# Patient Record
Sex: Male | Born: 1955 | Race: White | Hispanic: No | Marital: Married | Smoking: Never smoker
Health system: Southern US, Community
[De-identification: ages and names within clinical notes are randomized; demographics above are authoritative.]

## PROBLEM LIST (undated history)

## (undated) DIAGNOSIS — Z720 Tobacco use: Secondary | ICD-10-CM

## (undated) DIAGNOSIS — M069 Rheumatoid arthritis, unspecified: Secondary | ICD-10-CM

## (undated) DIAGNOSIS — N529 Male erectile dysfunction, unspecified: Secondary | ICD-10-CM

## (undated) DIAGNOSIS — M7031 Other bursitis of elbow, right elbow: Secondary | ICD-10-CM

## (undated) DIAGNOSIS — I4892 Unspecified atrial flutter: Secondary | ICD-10-CM

## (undated) HISTORY — DX: Tobacco use: Z72.0

## (undated) HISTORY — DX: Other bursitis of elbow, right elbow: M70.31

## (undated) HISTORY — DX: Rheumatoid arthritis, unspecified: M06.9

## (undated) HISTORY — DX: Male erectile dysfunction, unspecified: N52.9

## (undated) HISTORY — DX: Unspecified atrial flutter: I48.92

## (undated) HISTORY — PX: COLONOSCOPY: SHX174

---

## 1997-04-03 HISTORY — PX: BACK SURGERY: SHX140

## 1998-01-13 ENCOUNTER — Inpatient Hospital Stay (HOSPITAL_COMMUNITY): Admission: RE | Admit: 1998-01-13 | Discharge: 1998-01-14 | Payer: Self-pay | Admitting: Neurosurgery

## 1998-01-13 ENCOUNTER — Encounter: Payer: Self-pay | Admitting: Neurosurgery

## 1999-03-12 ENCOUNTER — Emergency Department (HOSPITAL_COMMUNITY): Admission: EM | Admit: 1999-03-12 | Discharge: 1999-03-12 | Payer: Self-pay

## 1999-03-12 ENCOUNTER — Encounter: Payer: Self-pay | Admitting: Endocrinology

## 2002-01-04 ENCOUNTER — Emergency Department (HOSPITAL_COMMUNITY): Admission: EM | Admit: 2002-01-04 | Discharge: 2002-01-04 | Payer: Self-pay | Admitting: *Deleted

## 2002-01-04 ENCOUNTER — Encounter: Payer: Self-pay | Admitting: *Deleted

## 2004-04-05 ENCOUNTER — Ambulatory Visit (HOSPITAL_BASED_OUTPATIENT_CLINIC_OR_DEPARTMENT_OTHER): Admission: RE | Admit: 2004-04-05 | Discharge: 2004-04-05 | Payer: Self-pay | Admitting: Surgery

## 2004-04-05 ENCOUNTER — Ambulatory Visit (HOSPITAL_COMMUNITY): Admission: RE | Admit: 2004-04-05 | Discharge: 2004-04-05 | Payer: Self-pay | Admitting: Surgery

## 2004-04-05 ENCOUNTER — Encounter (INDEPENDENT_AMBULATORY_CARE_PROVIDER_SITE_OTHER): Payer: Self-pay | Admitting: Specialist

## 2004-10-27 ENCOUNTER — Emergency Department (HOSPITAL_COMMUNITY): Admission: EM | Admit: 2004-10-27 | Discharge: 2004-10-27 | Payer: Self-pay | Admitting: Family Medicine

## 2004-11-05 ENCOUNTER — Emergency Department (HOSPITAL_COMMUNITY): Admission: EM | Admit: 2004-11-05 | Discharge: 2004-11-05 | Payer: Self-pay | Admitting: Emergency Medicine

## 2005-03-18 ENCOUNTER — Emergency Department (HOSPITAL_COMMUNITY): Admission: EM | Admit: 2005-03-18 | Discharge: 2005-03-18 | Payer: Self-pay | Admitting: Family Medicine

## 2007-10-20 ENCOUNTER — Emergency Department (HOSPITAL_COMMUNITY): Admission: EM | Admit: 2007-10-20 | Discharge: 2007-10-20 | Payer: Self-pay | Admitting: Family Medicine

## 2008-08-04 ENCOUNTER — Ambulatory Visit (HOSPITAL_COMMUNITY): Admission: RE | Admit: 2008-08-04 | Discharge: 2008-08-04 | Payer: Self-pay | Admitting: Family Medicine

## 2008-08-10 ENCOUNTER — Ambulatory Visit (HOSPITAL_COMMUNITY): Admission: RE | Admit: 2008-08-10 | Discharge: 2008-08-10 | Payer: Self-pay | Admitting: Family Medicine

## 2008-08-14 ENCOUNTER — Encounter: Admission: RE | Admit: 2008-08-14 | Discharge: 2008-08-14 | Payer: Self-pay | Admitting: Surgery

## 2008-08-18 ENCOUNTER — Ambulatory Visit: Payer: Self-pay | Admitting: Internal Medicine

## 2008-08-18 DIAGNOSIS — R05 Cough: Secondary | ICD-10-CM

## 2008-08-18 DIAGNOSIS — R059 Cough, unspecified: Secondary | ICD-10-CM | POA: Insufficient documentation

## 2008-08-18 DIAGNOSIS — J984 Other disorders of lung: Secondary | ICD-10-CM | POA: Insufficient documentation

## 2008-08-19 ENCOUNTER — Ambulatory Visit: Payer: Self-pay | Admitting: Cardiology

## 2008-08-19 ENCOUNTER — Telehealth: Payer: Self-pay | Admitting: Internal Medicine

## 2008-08-27 ENCOUNTER — Encounter: Payer: Self-pay | Admitting: Internal Medicine

## 2010-05-03 NOTE — Progress Notes (Signed)
Summary: results  Phone Note Call from Patient Call back at  [phone]   Caller: Patient Call For: Quatisha Zylka Reason for Call: Talk to Nurse, Lab or Test Results Summary of Call: call with results of ct Initial call taken by: Eugene Gavia,  Aug 19, 2008 3:29 PM  Follow-up for Phone Call        done. next ct in 6 months Follow-up by: Kalman Shan MD,  Aug 19, 2008 5:39 PM

## 2010-05-03 NOTE — Assessment & Plan Note (Signed)
Summary: r non-calcified pleural/r lung base plaque/apc   Visit Type:  Initial Consult Copy to:  Dr. Azucena Kuba Primary Provider/Referring Provider:  Dr. Azucena Kuba  CC:  Pt here for pulmonary consult. pt c/o 35lb weight loss sne Feb. 2010 and abnormal CT of the chest. .  History of Present Illness: IOV 08/18/2008: 55 year old male. Has alternate diarrhea and constipation since FEb 2010. On and off since onset. Insidious onset. Stools are loose and watery. Associated with intermittently constipation. Associated abdominal pain present . Pain is RUQ to Rt infrascapular region. Pain incresaed by eating. Pain relieved by not eating. Very intense in severity. No specific food association for pain. Associated nausea and vomit present.   Associaed  wt loss 232# -> 2073 in 2 weeks +. Symptoms led to [place] gall bladder -> HIDA scan -> CT abdomen -> lung cuts reportedly showed small nodule in lung in Rt base. He is now concerned that wt loss is related to pulmonary nodule. However, there is no full ct chest  Symptoms also associated with mild-moderate cough for 2-3 weeks, dry cough, worst in day time. No associated paroxysmal nocturnal dyspnea, gerd, uri, hemoptysis.   Current Medications (verified): 1)  None  Allergies (verified): 1)  ! 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  Review of Systems       The patient complains of weight change, abdominal pain, and non-productive cough.  The patient denies shortness of breath at rest, coughing up blood, chest pain, irregular heartbeats, indigestion, loss of appetite, difficulty swallowing, sore throat, tooth/dental problems, headaches, nasal congestion/difficulty breathing through nose, sneezing, itching, ear ache, anxiety, depression, hand/feet swelling, joint stiffness or pain, rash, change in color of mucus, fever, shortness of breath with activity, productive cough, and acid heartburn.    Vital Signs:  Patient profile:   55 year old male Height:      77 inches Weight:      213.13 pounds BMI:     25.36 O2 Sat:      98 % Temp:     98.0 degrees F oral Pulse rate:   87 / minute BP sitting:   122 / 78  (right arm) Cuff size:   regular  Vitals Entered By: Carron Curie CMA (Aug 18, 2008  3:45 PM)  O2 Sat at Rest %:  98 O2 Flow:  room air CC: Pt here for pulmonary consult. pt c/o 35lb weight loss sne Feb. 2010 and abnormal CT of the chest.  Comments Medications reviewed with patient Carron Curie CMA  Aug 18, 2008 3:47 PM    Physical Exam  General:  well developed, well nourished, in no acute distress Head:  normocephalic and atraumatic Eyes:  PERRLA/EOM intact; conjunctiva and sclera clear Ears:  TMs intact and clear with normal canals Nose:  no deformity, discharge, inflammation, or lesions Mouth:  no deformity or lesions Neck:  no masses, thyromegaly, or abnormal cervical nodes Chest Wall:  no deformities noted Lungs:  clear bilaterally to auscultation and percussion Heart:  regular rate and rhythm, S1, S2 without murmurs, rubs, gallops, or clicks Abdomen:  bowel sounds  positive; abdomen soft and non-tender without masses, or organomegaly Msk:  no deformity or scoliosis noted with normal posture Pulses:  pulses normal Extremities:  no clubbing, cyanosis, edema, or deformity noted Neurologic:  CN II-XII grossly intact with normal reflexes, coordination, muscle strength and tone Skin:  intact without lesions or rashes Cervical Nodes:  no significant adenopathy Axillary Nodes:  no significant adenopathy Psych:  alert and cooperative; normal mood and affect; normal attention span and concentration   CT of Abdomen  Procedure date:  08/14/2008  Findings:       IMPRESSION:   1.  No significant abnormality on CT the abdomen.   2.  Probable fatty infiltration of the liver.   3.  Probable small noncalcified plaque at the right lung base   abutting the hemidiaphragm. Read By:  Juline Patch,  M.D.   Released By:  Juline Patch,  M.D.  personally reviewed and agree with Dr. Gery Pray  Impression & Recommendations:  Problem # 1:  PULMONARY NODULE (ICD-518.89) Assessment New CT 08/14/2008 is one of abdomen. I am not sure what to make of the partial nodule on the ct abdomen lung cut. We have no idea whta the full extension and if it is related to current symptoms  plan get full ct chest review based on ct chest Orders: Radiology Referral (Radiology) Consultation Level IV (81191)  Problem # 2:  COUGH (ICD-786.2) Assessment: New  address at next visit  Orders: Consultation Level IV (47829)  Patient Instructions: 1)  please have CT scan chest with contrast 08/19/2008 2)  call me after ct to review it 3)  further followup based on above

## 2010-05-03 NOTE — Miscellaneous (Signed)
Summary: CT in 6 mths  Clinical Lists Changes  Orders: Added new Referral order of Radiology Referral (Radiology) - Signed

## 2010-08-19 NOTE — Op Note (Signed)
NAME:  MAINOR, HELLMANN NO.:  [id number]   MEDICAL RECORD NO.:  [id number]          PATIENT TYPE:  AMB   LOCATION:  DSC                          FACILITY:  [facility]   PHYSICIAN:  Velora Heckler, MD      DATE OF BIRTH:  [birth year]   DATE OF PROCEDURE:  04/05/2004  DATE OF DISCHARGE:                                 OPERATIVE REPORT   PREOPERATIVE DIAGNOSIS:  Soft tissue mass right occiput.   POSTOPERATIVE DIAGNOSIS:  Soft tissue mass right occiput.   PROCEDURE:  Excise 1 cm subcutaneous mass right occiput.   SURGEON:  Velora Heckler, M.D.   ANESTHESIA:  1% lidocaine local with epinephrine.   ESTIMATED BLOOD LOSS:  Minimal.   PREPARATION:  Betadine.   COMPLICATIONS:  None.   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  This has now recurred and increased in size.  He desires  reexcision.   BODY OF REPORT:  The procedure was done in the minor procedure room at the  [facility].  The patient was brought to the operating  room and placed in a prone position on the operating room table.  Skin was  prepped with Betadine.  It was draped in the usual aseptic fashion.  Skin  was anesthetized with local anesthetic with epinephrine.  Using a #15 blade,  an elliptical incision was made so as to encompass the previous scar and the  underlying soft tissue mass.  Dissection was carried full thickness into the  subcutaneous tissues.  A cystic type lesion with a solid core was excised.  The entire wall was excised.  Specimen was submitted to pathology for  review.  It measured slightly less than 1 cm in dimension.  Skin was  reapproximated with interrupted 4-0 nylon sutures.  Good hemostasis was  noted.  Neosporin ointment and a dry gauze dressing were placed.  The  patient tolerated the procedure well.      Todd   TMG/MEDQ  D:  04/05/2004   T:  04/05/2004  Job:  [id number]   cc:   Velora Heckler, MD  [address] [place]  [place] [address]  Fax: [phone]

## 2013-12-09 DIAGNOSIS — I4892 Unspecified atrial flutter: Secondary | ICD-10-CM | POA: Insufficient documentation

## 2013-12-16 ENCOUNTER — Ambulatory Visit (INDEPENDENT_AMBULATORY_CARE_PROVIDER_SITE_OTHER): Payer: BC Managed Care – PPO | Admitting: Cardiovascular Disease

## 2013-12-16 ENCOUNTER — Encounter: Payer: Self-pay | Admitting: Cardiovascular Disease

## 2013-12-16 VITALS — BP 122/86 | HR 64 | Ht 77.0 in | Wt 220.4 lb

## 2013-12-16 DIAGNOSIS — I483 Typical atrial flutter: Secondary | ICD-10-CM

## 2013-12-16 DIAGNOSIS — F17200 Nicotine dependence, unspecified, uncomplicated: Secondary | ICD-10-CM

## 2013-12-16 DIAGNOSIS — J984 Other disorders of lung: Secondary | ICD-10-CM

## 2013-12-16 DIAGNOSIS — I4892 Unspecified atrial flutter: Secondary | ICD-10-CM

## 2013-12-16 MED ORDER — DILTIAZEM HCL ER COATED BEADS 120 MG PO CP24: 120.0000 mg | ORAL_CAPSULE | Freq: Every day | ORAL | Status: DC

## 2013-12-16 NOTE — Progress Notes (Addendum)
     History of Present Illness: 58 yo male with history new onset atrial flutter, tobacco abuse (smokeless tobacco) of here today as a new patient for evaluation of atrial flutter. He was admitted to [facility] 12/03/13-12/04/13 with weakness and SOB and found to have tachycardia. His HR was initially 160 bpm. He was given a dose of adenosine which revealed atrial flutter. He was treated with metoprolol and IV Cardizem. His CHADS score was 0 so he was discharged home with ASA and no anti-coagulation. He was discharged home on Cardizem 30 mg po Q6 hours. Echo at [facility] but not available to review.   He feels much better. No chest pain or SOB. No palpitations.   Addendum 12/30/13: Echo reviewed from [place]. 12/04/13. LVEF normal. RV function normal. Trivial MR, TR.   Primary Care Physician: Donald Prose   Past Medical History  Diagnosis Date  . Atrial flutter   . Erectile dysfunction   . Tobacco abuse     Smokeless tobacco    Past Surgical History  Procedure Laterality Date  . Back surgery  1999    Current Outpatient Prescriptions  Medication Sig Dispense Refill  . aspirin EC 81 MG tablet Take by mouth daily.       Marland Kitchen diltiazem (CARDIZEM) 30 MG tablet Take by mouth 4 (four) times daily.        No current facility-administered medications for this visit.    Allergies  Allergen Reactions  . Codeine Other (See Comments)    agitation  . Penicillin G Other (See Comments)  . Penicillins Itching, Swelling and Rash    History   Social History  . Marital Status: Married    Spouse Name: N/A    Number of Children: 1  . Years of Education: N/A   Occupational History  . Electrician    Social History Main Topics  . Smoking status: Never Smoker   . Smokeless tobacco: Current User    Types: Snuff  . Alcohol Use: Yes     Comment: Rare  . Drug Use: No  . Sexual Activity: Not on file   Other Topics Concern  . Not on file   Social History Narrative  . No narrative on file     Family History  Problem Relation Age of Onset  . Heart attack Father   . Heart attack Mother     Review of Systems:  As stated in the HPI and otherwise negative.   BP 122/86  Pulse 64  Ht 6\' 5"  (1.956 m)  Wt 220 lb 6.4 oz (99.973 kg)  BMI 26.13 kg/m2  Physical Examination: General: Well developed, well nourished, NAD HEENT: OP clear, mucus membranes moist SKIN: warm, dry. No rashes. Neuro: No focal deficits Musculoskeletal: Muscle strength 5/5 all ext Psychiatric: Mood and affect normal Neck: No JVD, no carotid bruits, no thyromegaly, no lymphadenopathy. Lungs:Clear bilaterally, no wheezes, rhonci, crackles Cardiovascular: Regular rate and rhythm. No murmurs, gallops or rubs. Abdomen:Soft. Bowel sounds present. Non-tender.  Extremities: No lower extremity edema. Pulses are 2 + in the bilateral DP/PT.  EKG: NSR, rate 64 bpm.   Assessment and Plan:   1. Atrial flutter: He is back in sinus today. He feels much better. CHADSVASC score of 0. Anti-coagulation is not indicated. Will continue ASA 81 mg daily. Will change Cardizem 30 mg Q6 hours to Cardizem CD 120 mg daily. I will get records from [facility] to review echo. I will see him back in 6 months.

## 2013-12-16 NOTE — Patient Instructions (Signed)
Your physician has recommended you make the following change in your medication:   START TAKING CARDIZEM CD [place]  Your physician wants you to follow-up in: [place] will receive a reminder letter in the mail two months in advance. If you don't receive a letter, please call our office to schedule the follow-up appointment.

## 2014-01-26 ENCOUNTER — Ambulatory Visit (INDEPENDENT_AMBULATORY_CARE_PROVIDER_SITE_OTHER): Payer: BC Managed Care – PPO | Admitting: Podiatry

## 2014-01-26 ENCOUNTER — Encounter: Payer: Self-pay | Admitting: Podiatry

## 2014-01-26 VITALS — Ht 77.0 in | Wt 219.0 lb

## 2014-01-26 DIAGNOSIS — Q667 Congenital pes cavus, unspecified foot: Secondary | ICD-10-CM

## 2014-01-26 DIAGNOSIS — Q828 Other specified congenital malformations of skin: Secondary | ICD-10-CM

## 2014-01-26 NOTE — Progress Notes (Signed)
   Subjective:    Patient ID: Peter Mcmillan, male    DOB: 04-17-1955, 58 y.o.   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   Review of Systems  All other systems reviewed and are negative.      Objective:   Physical Exam Orientated 3  Vascular: DP and PT pulses 2/4 bilaterally Capillary reflex medially bilaterally  Neurological: Ankle reflex equal and reactive bilaterally  Dermatological: Nuclear plantar keratoses fifth right MPJ surrounded with some fibrous leathery scan and keratoses Slight non-nucleated plantar keratoses fifth left MPJ  Musculoskeletal: Pes cavus bilaterally Mild rear foot varus bilaterally         Assessment & Plan:   Assessment: Pes cavus bilaterally Porokeratosis fifth MPJ right  Plan: The plantar keratoses and right was debrided back with salinocaine I attached a felt pad to offload the fifth right MPJ and dispensed to transfer patient to attach to his shoes  Patient advised that lesion most likely would be chronic and return as needed. An accommodative foot orthotic could be used if the padding was successful.

## 2014-01-27 ENCOUNTER — Encounter: Payer: Self-pay | Admitting: Podiatry

## 2015-10-31 ENCOUNTER — Emergency Department (HOSPITAL_COMMUNITY)
Admission: EM | Admit: 2015-10-31 | Discharge: 2015-10-31 | Disposition: A | Discharge status: Home / Self Care | Payer: BLUE CROSS/BLUE SHIELD | Attending: Emergency Medicine | Admitting: Emergency Medicine

## 2015-10-31 ENCOUNTER — Emergency Department (HOSPITAL_COMMUNITY): Payer: BLUE CROSS/BLUE SHIELD

## 2015-10-31 DIAGNOSIS — I484 Atypical atrial flutter: Secondary | ICD-10-CM | POA: Diagnosis not present

## 2015-10-31 DIAGNOSIS — R000 Tachycardia, unspecified: Secondary | ICD-10-CM | POA: Diagnosis present

## 2015-10-31 DIAGNOSIS — I4892 Unspecified atrial flutter: Secondary | ICD-10-CM

## 2015-10-31 LAB — MAGNESIUM: Magnesium: 1.9 mg/dL (ref 1.7–2.4)

## 2015-10-31 LAB — CBC WITH DIFFERENTIAL/PLATELET
Basophils Absolute: 0 10*3/uL (ref 0.0–0.1)
Basophils Relative: 1 %
Eosinophils Absolute: 0.2 10*3/uL (ref 0.0–0.7)
Eosinophils Relative: 2 %
HCT: 49.5 % (ref 39.0–52.0)
Hemoglobin: 17.3 g/dL — ABNORMAL HIGH (ref 13.0–17.0)
Lymphocytes Relative: 22 %
Lymphs Abs: 2 10*3/uL (ref 0.7–4.0)
MCH: 31.2 pg (ref 26.0–34.0)
MCHC: 34.9 g/dL (ref 30.0–36.0)
MCV: 89.2 fL (ref 78.0–100.0)
Monocytes Absolute: 0.9 10*3/uL (ref 0.1–1.0)
Monocytes Relative: 10 %
Neutro Abs: 5.7 10*3/uL (ref 1.7–7.7)
Neutrophils Relative %: 65 %
Platelets: 281 10*3/uL (ref 150–400)
RBC: 5.55 MIL/uL (ref 4.22–5.81)
RDW: 12.4 % (ref 11.5–15.5)
WBC: 8.7 10*3/uL (ref 4.0–10.5)

## 2015-10-31 LAB — BASIC METABOLIC PANEL
Anion gap: 8 (ref 5–15)
BUN: 12 mg/dL (ref 6–20)
CO2: 22 mmol/L (ref 22–32)
Calcium: 9.4 mg/dL (ref 8.9–10.3)
Chloride: 108 mmol/L (ref 101–111)
Creatinine, Ser: 0.93 mg/dL (ref 0.61–1.24)
GFR calc Af Amer: >60 mL/min (ref >60)
GFR calc non Af Amer: >60 mL/min (ref >60)
Glucose, Bld: 145 mg/dL — ABNORMAL HIGH (ref 65–99)
Potassium: 3.6 mmol/L (ref 3.5–5.1)
Sodium: 138 mmol/L (ref 135–145)

## 2015-10-31 LAB — I-STAT TROPONIN, ED: Troponin i, poc: 0.01 ng/mL (ref 0.00–0.08)

## 2015-10-31 LAB — PROTIME-INR
INR: 1
Prothrombin Time: 13.2 seconds (ref 11.4–15.2)

## 2015-10-31 MED ORDER — PROPOFOL 10 MG/ML IV BOLUS: 0.5000 mg/kg | Freq: Once | INTRAVENOUS | Status: DC

## 2015-10-31 MED ORDER — ASPIRIN 81 MG PO CHEW
324.0000 mg | CHEWABLE_TABLET | Freq: Once | ORAL | Status: AC
  Administered 2015-10-31: 324 mg via ORAL
  Filled 2015-10-31: qty 4

## 2015-10-31 MED ORDER — APIXABAN 5 MG PO TABS
5.0000 mg | ORAL_TABLET | Freq: Once | ORAL | Status: AC
  Administered 2015-10-31: 5 mg via ORAL
  Filled 2015-10-31: qty 1

## 2015-10-31 MED ORDER — SODIUM CHLORIDE 0.9 % IV BOLUS (SEPSIS)
1000.0000 mL | Freq: Once | INTRAVENOUS | Status: AC
  Administered 2015-10-31: 1000 mL via INTRAVENOUS

## 2015-10-31 MED ORDER — PROPOFOL 10 MG/ML IV BOLUS
INTRAVENOUS | Status: AC
  Filled 2015-10-31: qty 20

## 2015-10-31 MED ORDER — PROPOFOL 10 MG/ML IV BOLUS
INTRAVENOUS | Status: AC | PRN
  Administered 2015-10-31: 40 mg via INTRAVENOUS

## 2015-10-31 MED ORDER — DILTIAZEM HCL 25 MG/5ML IV SOLN
20.0000 mg | Freq: Once | INTRAVENOUS | Status: AC
  Administered 2015-10-31: 20 mg via INTRAVENOUS
  Filled 2015-10-31: qty 5

## 2015-10-31 MED ORDER — DILTIAZEM HCL 100 MG IV SOLR
5.0000 mg/h | Freq: Once | INTRAVENOUS | Status: AC
  Administered 2015-10-31: 5 mg/h via INTRAVENOUS
  Filled 2015-10-31: qty 100

## 2015-10-31 MED ORDER — FLECAINIDE ACETATE 100 MG PO TABS
300.0000 mg | ORAL_TABLET | Freq: Once | ORAL | Status: AC
Start: 2015-10-31 — End: 2015-10-31
  Administered 2015-10-31: 300 mg via ORAL
  Filled 2015-10-31: qty 3

## 2015-10-31 NOTE — ED Triage Notes (Addendum)
Pt c/o woke up at 0200 with weakness to arm. Pt has history of afib. Pt took his heart rate at home and it was in the 160's. Pt went to [facility] and heart rate again in 160's.

## 2015-10-31 NOTE — ED Notes (Signed)
PT is in stable condition upon d/c and ambulates from ED. 

## 2015-10-31 NOTE — ED Provider Notes (Signed)
[facility] DEPT Provider Note   CSN: [id number] Arrival date & time: 10/31/15  1020  First Provider Contact:  None       History   Chief Complaint Chief Complaint  Patient presents with  . Tachycardia  . Weakness    HPI Peter Mcmillan is a 60 y.o. male.  HPI  Patient with history of a-flutter at [place] 3 years ago that was difficult to treat per chart review, currently on extended release Diltiazem 120mg  Po, presents for palpitations.  This awoken from sleep this morning.  Associated with chest pressure and left arm pain.  He states this feels like the last time he had a-flutter.  Tried taking his PO diltiazem this morning without improvement.  Otherwise, mentions a recent chronic cough x3 weeks.  No fevers, SOB, abdominal pain.  Past Medical History:  Diagnosis Date  . Atrial flutter   . Erectile dysfunction   . Tobacco abuse    Smokeless tobacco    Patient Active Problem List   Diagnosis Date Noted  . PULMONARY NODULE 08/18/2008  . COUGH 08/18/2008    Past Surgical History:  Procedure Laterality Date  . BACK SURGERY  1999       Home Medications    Prior to Admission medications   Medication Sig Start Date End Date Taking? Authorizing Provider  diltiazem (CARDIZEM CD) 120 MG 24 hr capsule Take 1 capsule (120 mg total) by mouth daily. 12/16/13  Yes Burnell Blanks, MD    Family History Family History  Problem Relation Age of Onset  . Heart attack Father   . Heart attack Mother     Social History Social History  Substance Use Topics  . Smoking status: Never Smoker  . Smokeless tobacco: Current User    Types: Snuff  . Alcohol use Yes     Comment: Rare     Allergies   Codeine; Penicillin g; and Penicillins   Review of Systems Review of Systems  Constitutional: Negative for chills and fever.  HENT: Negative for ear pain and sore throat.   Eyes: Negative for pain and visual disturbance.  Respiratory: Negative for cough and  shortness of breath.   Cardiovascular: Positive for chest pain and palpitations.  Gastrointestinal: Negative for abdominal pain and vomiting.  Genitourinary: Negative for dysuria and hematuria.  Musculoskeletal: Negative for arthralgias and back pain.  Skin: Negative for color change and rash.  Neurological: Negative for seizures and syncope.  All other systems reviewed and are negative.    Physical Exam Updated Vital Signs BP 111/82   Pulse (!) 161   Resp 19   SpO2 97%   Physical Exam  Constitutional: He appears well-developed and well-nourished.  HENT:  Head: Normocephalic and atraumatic.  Eyes: Conjunctivae are normal.  Neck: Neck supple.  Cardiovascular: Regular rhythm.  Tachycardia present.   No murmur heard. Pulmonary/Chest: Effort normal and breath sounds normal. No respiratory distress.  Abdominal: Soft. There is no tenderness.  Musculoskeletal: He exhibits no edema.  Neurological: He is alert.  Skin: Skin is warm and dry.  Psychiatric: He has a normal mood and affect.  Nursing note and vitals reviewed.    ED Treatments / Results  Labs (all labs ordered are listed, but only abnormal results are displayed) Labs Reviewed  CBC WITH DIFFERENTIAL/PLATELET - Abnormal; Notable for the following:       Result Value   Hemoglobin 17.3 (*)    All other components within normal limits  BASIC METABOLIC PANEL - Abnormal; Notable  for the following:    Glucose, Bld 145 (*)    All other components within normal limits  MAGNESIUM  PROTIME-INR  I-STAT TROPOININ, ED    EKG  EKG Interpretation None       Radiology Dg Chest Portable 1 View  Result Date: 10/31/2015 CLINICAL DATA:  Cough EXAM: PORTABLE CHEST 1 VIEW COMPARISON:  Chest CT 08/19/2008 FINDINGS: Heart and mediastinal contours are within normal limits. No focal opacities or effusions. No acute bony abnormality. IMPRESSION: No active disease. Electronically Signed   By: Rolm Baptise M.D.   On: 10/31/2015  11:28   Procedures Procedures (including critical care time)  Medications Ordered in ED Medications  diltiazem (CARDIZEM) injection 20 mg (20 mg Intravenous Given 10/31/15 1050)  sodium chloride 0.9 % bolus 1,000 mL (0 mLs Intravenous Stopped 10/31/15 1128)  diltiazem (CARDIZEM) 100 mg in dextrose 5 % 100 mL (1 mg/mL) infusion (12.5 mg/hr Intravenous Rate/Dose Change 10/31/15 1254)  aspirin chewable tablet 324 mg (324 mg Oral Given 10/31/15 1155)  flecainide (TAMBOCOR) tablet 300 mg (300 mg Oral Given 10/31/15 1253)     Initial Impression / Assessment and Plan / ED Course  I have reviewed the triage vital signs and the nursing notes.  Pertinent labs & imaging results that were available during my care of the patient were reviewed by me and considered in my medical decision making (see chart for details).  Clinical Course    Initial evaluation patient was tachycardic in the 160s and a flutter with RVR. Patientwas normotensive. Diltiazem injection and infusion was ordered with no improvement in heart rate.  labs were overall unremarkable. Troponin was negative. Cardiology was consulted for assistance, gave flecanide, and plans to cardiovert.  Then he will ikely be discharged.  Patient signed out to Dr. Winfield Cunas @ 1530.  Final Clinical Impressions(s) / ED Diagnoses   Final diagnoses:  None    New Prescriptions New Prescriptions   No medications on file     Levada Schilling, MD 10/31/15 [place], MD 11/01/15 [place], MD 11/01/15 2110

## 2015-10-31 NOTE — ED Notes (Signed)
Called pharmacy to have them send Eliquis so pt may be d/c.

## 2015-10-31 NOTE — Sedation Documentation (Signed)
Pt shocked at [place] after heart rate is synced. Immediately following shock pt heart rate returns to NSR.

## 2015-10-31 NOTE — Progress Notes (Addendum)
Patient ID: Peter Mcmillan, male   DOB: [birth year], 60 y.o.   MRN: [id number]  EP Procedure Note  Procedure: DC cardioversion.  Preoperative diagnosis: atrial flutter with a RVR  Postoperative diagnosis: same as preoperative diagnosis  Description of the procedure: after informed consent was obtained, the patient was prepped in the usual manner. The electrodispersive pad was placed in the AP position. 60 mg of IV propofol was given and the patient was cardioverted with 200 joule of synchronized biphasic energy, restoring NSR. He was allowed to recover in the usual manner.   Complications: none immediately  Conclusion: succsessful DC CV in a patient with uncontrolled, symptomatic atrial flutter.  Skyelynn Rambeau,M.D.  Addendum: The patients very low risk for thromboembolic events. However, his risk of bleeding for a month on Eliquis is even less. For this reason, I have verbal ordered him to receive 5 mg of eliquis tonight prior to DC and instructed him to return to our office in the a.m. For a free monthl supply of eliquis.   Mikle Bosworth.D.

## 2015-10-31 NOTE — Consult Note (Signed)
   Reason for Consult:atrial flutter with a RVR  Referring Physician: . Dr. Leitha Schuller Varallo is an 60 y.o. male.   HPI: The patient is a 60 yo man with a h/o atrial flutter approx 2 years ago. He has preserved LV function and reverted back to NSR. He has done well. He went to bed last night feeling normally but awoke at 2 and felt like his heart was racing. He went back to sleep. When he got up this morning he noted more racing and presents for additional evaluation. The patient had a sandwich at 8 a.m. But nothing since then. He denies chest pain or sob. No syncope and no edema. His Chadsvasc is 0.   PMH: Past Medical History:  Diagnosis Date  . Atrial flutter   . Erectile dysfunction   . Tobacco abuse    Smokeless tobacco    PSHX: Past Surgical History:  Procedure Laterality Date  . BACK SURGERY  1999    FAMHX: Family History  Problem Relation Age of Onset  . Heart attack Father   . Heart attack Mother     Social History:  reports that he has never smoked. His smokeless tobacco use includes Snuff. He reports that he drinks alcohol. He reports that he does not use drugs.  Allergies:  Allergies  Allergen Reactions  . Codeine Other (See Comments)    agitation  . Penicillin G Other (See Comments)  . Penicillins Itching, Swelling and Rash    Medications: I have reviewed the patient's current medications.  Dg Chest Portable 1 View  Result Date: 10/31/2015 CLINICAL DATA:  Cough EXAM: PORTABLE CHEST 1 VIEW COMPARISON:  Chest CT 08/19/2008 FINDINGS: Heart and mediastinal contours are within normal limits. No focal opacities or effusions. No acute bony abnormality. IMPRESSION: No active disease. Electronically Signed   By: Rolm Baptise M.D.   On: 10/31/2015 11:28   ROS  As stated in the HPI and negative for all other systems.  Physical Exam  Vitals:Blood pressure 113/76, pulse (!) 162, resp. rate 20, SpO2 96 %.  Well appearing middle aged man, NAD HEENT:  Unremarkable Neck:  6 cm JVD, no thyromegally Lymphatics:  No adenopathy Back:  No CVA tenderness Lungs:  Clear with no wheezes HEART:  Regular rate rhythm, no murmurs, no rubs, no clicks Abd:  Flat, positive bowel sounds, no organomegally, no rebound, no guarding Ext:  2 plus pulses, no edema, no cyanosis, no clubbing Skin:  No rashes no nodules Neuro:  CN II through XII intact, motor grossly intact  Tele - atrial flutter with 2:1 AV conduction.   Labs - reviewed  CXR - reviewed.- no active disease.   Assessment/Plan: 1. Atrial flutter - I have recommended he receive 300 mg of oral flecainide and continue his IV cardizem. At 2:30, he will have been 6 hrs out from last food and will hope to cardiovert him if he has not converted on his own.  Carleene Overlie TaylorMD 10/31/2015, 1:20 PM

## 2015-11-01 ENCOUNTER — Telehealth: Payer: Self-pay | Admitting: Internal Medicine

## 2015-11-01 ENCOUNTER — Other Ambulatory Visit: Payer: Self-pay | Admitting: Internal Medicine

## 2015-11-01 MED ORDER — APIXABAN 5 MG PO TABS: 5.0000 mg | ORAL_TABLET | Freq: Two times a day (BID) | ORAL | 0 refills | Status: DC

## 2015-11-01 NOTE — Plan of Care (Cosign Needed)
Patient was reassessed and felt well. HDS. HR WNL.  Usual and customary return precautions discussed and all questions answered. Pending first dose of Eliquis. No other issues noted.

## 2015-11-01 NOTE — Telephone Encounter (Signed)
Called pt and left message informing him that Dr. Lovena Le ask pt to come to the office to get samples of Eliquis 5 mg tablets, but pt stated that Dr. Lovena Le said Lipitor. I asked pt to call our office to get samples of Eliquis.

## 2015-11-15 ENCOUNTER — Encounter: Payer: Self-pay | Admitting: Internal Medicine

## 2015-11-26 ENCOUNTER — Encounter: Payer: Self-pay | Admitting: Internal Medicine

## 2015-11-26 ENCOUNTER — Ambulatory Visit (INDEPENDENT_AMBULATORY_CARE_PROVIDER_SITE_OTHER): Payer: BLUE CROSS/BLUE SHIELD | Admitting: Internal Medicine

## 2015-11-26 VITALS — BP 144/82 | HR 72 | Ht 77.0 in | Wt 220.0 lb

## 2015-11-26 DIAGNOSIS — I483 Typical atrial flutter: Secondary | ICD-10-CM

## 2015-11-26 DIAGNOSIS — I10 Essential (primary) hypertension: Secondary | ICD-10-CM

## 2015-11-26 MED ORDER — DILTIAZEM HCL ER COATED BEADS 120 MG PO CP24: 120.0000 mg | ORAL_CAPSULE | Freq: Two times a day (BID) | ORAL | 11 refills | Status: DC

## 2015-11-26 NOTE — Progress Notes (Signed)
      HPI Peter Mcmillan returns to followup atrial flutter and HTN. He is a pleasant 60 yo man with HTN who presented with atrial flutter and a RVR several weeks ago. Medical therapy did not stop his flutter and he was cardioverted back to nSR. He has done well in the interim. He denies chest pain or sob. He is back to work. He has had no recurrent symptoms. His CHADSVASC score is 1.  Allergies  Allergen Reactions  . Codeine Other (See Comments)    agitation  . Penicillin G Other (See Comments)  . Penicillins Itching, Swelling and Rash     Current Outpatient Prescriptions  Medication Sig Dispense Refill  . diltiazem (CARDIZEM CD) 120 MG 24 hr capsule Take 1 capsule (120 mg total) by mouth 2 (two) times daily. 60 capsule 11   No current facility-administered medications for this visit.      Past Medical History:  Diagnosis Date  . Atrial flutter ([place])   . Erectile dysfunction   . Tobacco abuse    Smokeless tobacco    ROS:   All systems reviewed and negative except as noted in the HPI.   Past Surgical History:  Procedure Laterality Date  . BACK SURGERY  1999     Family History  Problem Relation Age of Onset  . Heart attack Father   . Heart attack Mother      Social History   Social History  . Marital status: Married    Spouse name: N/A  . Number of children: 1  . Years of education: N/A   Occupational History  . Electrician    Social History Main Topics  . Smoking status: Never Smoker  . Smokeless tobacco: Current User    Types: Snuff  . Alcohol use Yes     Comment: Rare  . Drug use: No  . Sexual activity: Not on file   Other Topics Concern  . Not on file   Social History Narrative  . No narrative on file     BP (!) 144/82   Pulse 72   Ht 6\' 5"  (1.956 m)   Wt 220 lb (99.8 kg)   SpO2 97%   BMI 26.09 kg/m   Physical Exam:  Well appearing middle aged man, NAD HEENT: Unremarkable Neck: 6 cm JVD, no thyromegally Lymphatics:  No  adenopathy Back:  No CVA tenderness Lungs:  Clear with no wheezes HEART:  Regular rate rhythm, no murmurs, no rubs, no clicks Abd:  soft, positive bowel sounds, no organomegally, no rebound, no guarding Ext:  2 plus pulses, no edema, no cyanosis, no clubbing Skin:  No rashes no nodules Neuro:  CN II through XII intact, motor grossly intact    Assess/Plan: 1. Atrial flutter - he will undergo watchful waiting. If he goes back to flutter I have asked him to call and restart his Eliquis. 2. HTN - his blood pressure is a little high. He will continue his calcium channel blocker. 3. Coags - his stroke risk is low. He will stop Eliquis for now.  Mikle Bosworth.D.

## 2015-11-26 NOTE — Patient Instructions (Addendum)
Medication Instructions:  STOP Eliquis A refill of your diltiazem has been sent to your pharmacy  Labwork: None Ordered   Testing/Procedures: None Ordered   Follow-Up: Your physician wants you to follow-up in: 6 months with Dr. Lovena Le.  You will receive a reminder letter in the mail two months in advance. If you don't receive a letter, please call our office to schedule the follow-up appointment.   Any Other Special Instructions Will Be Listed Below (If Applicable).     If you need a refill on your cardiac medications before your next appointment, please call your pharmacy.

## 2016-08-09 DIAGNOSIS — Z0000 Encounter for general adult medical examination without abnormal findings: Secondary | ICD-10-CM | POA: Diagnosis not present

## 2016-08-09 DIAGNOSIS — E291 Testicular hypofunction: Secondary | ICD-10-CM | POA: Diagnosis not present

## 2016-08-09 DIAGNOSIS — E785 Hyperlipidemia, unspecified: Secondary | ICD-10-CM | POA: Diagnosis not present

## 2016-08-09 DIAGNOSIS — I10 Essential (primary) hypertension: Secondary | ICD-10-CM | POA: Diagnosis not present

## 2016-08-09 DIAGNOSIS — Z125 Encounter for screening for malignant neoplasm of prostate: Secondary | ICD-10-CM | POA: Diagnosis not present

## 2016-08-09 DIAGNOSIS — E559 Vitamin D deficiency, unspecified: Secondary | ICD-10-CM | POA: Diagnosis not present

## 2016-12-13 ENCOUNTER — Other Ambulatory Visit: Payer: Self-pay | Admitting: Internal Medicine

## 2016-12-27 ENCOUNTER — Other Ambulatory Visit: Payer: Self-pay | Admitting: Internal Medicine

## 2017-02-21 DIAGNOSIS — I4892 Unspecified atrial flutter: Secondary | ICD-10-CM | POA: Diagnosis not present

## 2017-02-21 DIAGNOSIS — I10 Essential (primary) hypertension: Secondary | ICD-10-CM | POA: Diagnosis not present

## 2017-02-21 DIAGNOSIS — J0100 Acute maxillary sinusitis, unspecified: Secondary | ICD-10-CM | POA: Diagnosis not present

## 2017-06-13 ENCOUNTER — Encounter (HOSPITAL_COMMUNITY): Payer: Self-pay | Admitting: *Deleted

## 2017-06-13 ENCOUNTER — Other Ambulatory Visit: Payer: Self-pay

## 2017-06-13 ENCOUNTER — Emergency Department (HOSPITAL_COMMUNITY)
Admission: EM | Admit: 2017-06-13 | Discharge: 2017-06-13 | Disposition: A | Discharge status: Home / Self Care | Payer: Commercial Managed Care - PPO | Attending: Emergency Medicine | Admitting: Emergency Medicine

## 2017-06-13 DIAGNOSIS — S51811A Laceration without foreign body of right forearm, initial encounter: Secondary | ICD-10-CM | POA: Insufficient documentation

## 2017-06-13 DIAGNOSIS — W268XXA Contact with other sharp object(s), not elsewhere classified, initial encounter: Secondary | ICD-10-CM | POA: Diagnosis not present

## 2017-06-13 DIAGNOSIS — Y9389 Activity, other specified: Secondary | ICD-10-CM | POA: Insufficient documentation

## 2017-06-13 DIAGNOSIS — Y929 Unspecified place or not applicable: Secondary | ICD-10-CM | POA: Diagnosis not present

## 2017-06-13 DIAGNOSIS — Z79899 Other long term (current) drug therapy: Secondary | ICD-10-CM | POA: Insufficient documentation

## 2017-06-13 DIAGNOSIS — Y990 Civilian activity done for income or pay: Secondary | ICD-10-CM | POA: Insufficient documentation

## 2017-06-13 DIAGNOSIS — S59911A Unspecified injury of right forearm, initial encounter: Secondary | ICD-10-CM | POA: Diagnosis present

## 2017-06-13 DIAGNOSIS — Z23 Encounter for immunization: Secondary | ICD-10-CM | POA: Insufficient documentation

## 2017-06-13 MED ORDER — LIDOCAINE-EPINEPHRINE (PF) 2 %-1:200000 IJ SOLN
INTRAMUSCULAR | Status: AC
  Filled 2017-06-13: qty 20

## 2017-06-13 MED ORDER — POVIDONE-IODINE 10 % EX SOLN
CUTANEOUS | Status: DC | PRN
  Administered 2017-06-13 (×2): via TOPICAL

## 2017-06-13 MED ORDER — TETANUS-DIPHTH-ACELL PERTUSSIS 5-2.5-18.5 LF-MCG/0.5 IM SUSP
0.5000 mL | Freq: Once | INTRAMUSCULAR | Status: AC
  Administered 2017-06-13: 0.5 mL via INTRAMUSCULAR
  Filled 2017-06-13: qty 0.5

## 2017-06-13 MED ORDER — POVIDONE-IODINE 10 % EX SOLN
CUTANEOUS | Status: AC
  Filled 2017-06-13: qty 45

## 2017-06-13 MED ORDER — LIDOCAINE-EPINEPHRINE (PF) 2 %-1:200000 IJ SOLN
10.0000 mL | Freq: Once | INTRAMUSCULAR | Status: AC
  Administered 2017-06-13: 10 mL via INTRADERMAL

## 2017-06-13 NOTE — Discharge Instructions (Signed)
Have your sutures removed in 10 days.  Keep your wound clean and dry,  Until a good scab forms - you may then wash gently twice daily with mild soap and water, but dry completely after.  Get rechecked for any sign of infection (redness,  Swelling,  Increased pain or drainage of purulent fluid). ° °

## 2017-06-13 NOTE — ED Triage Notes (Signed)
Pt has laceration to right forearm; pt state he was at work and reached in a box and cut his arm on metal; bleeding controlled at this time

## 2017-06-14 NOTE — ED Provider Notes (Signed)
[facility] EMERGENCY DEPARTMENT Provider Note   CSN: [id number] Arrival date & time: 06/13/17  2044     History   Chief Complaint Chief Complaint  Patient presents with  . Laceration    HPI Peter Mcmillan is a 62 y.o. male presenting with laceration to right forearm laceration occurring around 1:30 this afternoon at work, injured the area on a sharp metal edge of a machine.  He washed the site, applied neosporin and a bandaid and he finished his work day without problems with the site prior to arriving here.  Reports small amount of bleeding, otherwise no concerns. Nontender.  No numbness distal to the injury site.  Unsure of tetanus status.  HPI  Past Medical History:  Diagnosis Date  . Atrial flutter ([place])   . Erectile dysfunction   . Tobacco abuse    Smokeless tobacco    Patient Active Problem List   Diagnosis Date Noted  . PULMONARY NODULE 08/18/2008  . COUGH 08/18/2008    Past Surgical History:  Procedure Laterality Date  . BACK SURGERY  1999       Home Medications    Prior to Admission medications   Medication Sig Start Date End Date Taking? Authorizing Provider  diltiazem (CARDIZEM CD) 120 MG 24 hr capsule Take 1 capsule (120 mg total) by mouth 2 (two) times daily. Pt overdue for an appt. Must call and schedule for further refills 1st attempt 12/13/16   Evans Lance, MD    Family History Family History  Problem Relation Age of Onset  . Heart attack Father   . Heart attack Mother     Social History Social History   Tobacco Use  . Smoking status: Never Smoker  . Smokeless tobacco: Current User    Types: Snuff  Substance Use Topics  . Alcohol use: Yes    Comment: Rare  . Drug use: No     Allergies   Codeine; Penicillin g; and Penicillins   Review of Systems Review of Systems  Constitutional: Negative for chills and fever.  Respiratory: Negative.   Cardiovascular: Negative.   Musculoskeletal: Negative.   Skin: Positive for  wound.  Neurological: Negative for weakness and numbness.     Physical Exam Updated Vital Signs BP (!) 154/90   Pulse 75   Temp 98.4 F (36.9 C) (Oral)   Resp 18   Ht 6\' 5"  (1.956 m)   Wt 104.3 kg (230 lb)   SpO2 97%   BMI 27.27 kg/m   Physical Exam  Constitutional: He is oriented to person, place, and time. He appears well-developed and well-nourished.  HENT:  Head: Normocephalic.  Cardiovascular: Normal rate.  Pulmonary/Chest: Effort normal.  Musculoskeletal: He exhibits no tenderness.  Neurological: He is alert and oriented to person, place, and time. No sensory deficit.  Skin: Laceration noted.  3 cm linear laceration, vertical right lateral forearm, hemostatic, through the subcutaneous layer, it has not disrupted the fascia.      ED Treatments / Results  Labs (all labs ordered are listed, but only abnormal results are displayed) Labs Reviewed - No data to display  EKG  EKG Interpretation None       Radiology No results found.  Procedures Procedures (including critical care time)  LACERATION REPAIR Performed by: Evalee Jefferson Authorized by: Evalee Jefferson Consent: Verbal consent obtained. Risks and benefits: risks, benefits and alternatives were discussed Consent given by: patient Patient identity confirmed: provided demographic data Prepped and Draped in normal sterile fashion Wound explored  Laceration Location: right forearm  Laceration Length: 3.5 cm  No Foreign Bodies seen or palpated  Anesthesia: local infiltration  Local anesthetic: lidocaine 2% with epinephrine  Anesthetic total: 3 ml  Irrigation method: syringe Amount of cleaning: standard using betadine, followed by NS  Skin closure: ethilon 4-0     Number of sutures: 6  Technique: simple interupted  Patient tolerance: Patient tolerated the procedure well with no immediate complications.   Medications Ordered in ED Medications  lidocaine-EPINEPHrine (XYLOCAINE W/EPI) 2  %-1:200000 (PF) injection 10 mL (10 mLs Intradermal Given by Other 06/13/17 2256)  Tdap (BOOSTRIX) injection 0.5 mL (0.5 mLs Intramuscular Given 06/13/17 2300)     Initial Impression / Assessment and Plan / ED Course  I have reviewed the triage vital signs and the nursing notes.  Pertinent labs & imaging results that were available during my care of the patient were reviewed by me and considered in my medical decision making (see chart for details).     Wound care instructions given.  Pt advised to have sutures removed in 10 days,  Return here sooner for any signs of infection including redness, swelling, worse pain or drainage of pus. Tetanus updated.     Final Clinical Impressions(s) / ED Diagnoses   Final diagnoses:  Laceration of right forearm, initial encounter    ED Discharge Orders    None       Landis Martins 06/14/17 0216    Francine Graven, DO 06/14/17 2317

## 2017-06-24 ENCOUNTER — Emergency Department (HOSPITAL_COMMUNITY)
Admission: EM | Admit: 2017-06-24 | Discharge: 2017-06-24 | Disposition: A | Discharge status: Home / Self Care | Payer: Commercial Managed Care - PPO | Attending: Emergency Medicine | Admitting: Emergency Medicine

## 2017-06-24 ENCOUNTER — Encounter (HOSPITAL_COMMUNITY): Payer: Self-pay

## 2017-06-24 DIAGNOSIS — Y33XXXD Other specified events, undetermined intent, subsequent encounter: Secondary | ICD-10-CM | POA: Insufficient documentation

## 2017-06-24 DIAGNOSIS — I4892 Unspecified atrial flutter: Secondary | ICD-10-CM | POA: Diagnosis not present

## 2017-06-24 DIAGNOSIS — Z79899 Other long term (current) drug therapy: Secondary | ICD-10-CM | POA: Insufficient documentation

## 2017-06-24 DIAGNOSIS — S51811D Laceration without foreign body of right forearm, subsequent encounter: Secondary | ICD-10-CM | POA: Diagnosis present

## 2017-06-24 DIAGNOSIS — Z4802 Encounter for removal of sutures: Secondary | ICD-10-CM | POA: Diagnosis not present

## 2017-06-24 NOTE — Discharge Instructions (Addendum)
You may wash your wound normally at this time.  Avoid any significant stress on the wound edges for the next several days.

## 2017-06-24 NOTE — ED Triage Notes (Signed)
Pt here to get sutures out of right forearm. Sutures have been in for 10 days

## 2017-06-24 NOTE — ED Provider Notes (Signed)
[facility] EMERGENCY DEPARTMENT Provider Note   CSN: [id number] Arrival date & time: 06/24/17  1533     History   Chief Complaint Chief Complaint  Patient presents with  . Suture / Staple Removal    HPI Erice Ahles is a 62 y.o. male presenting for suture removal.  He had sutures placed 11 days ago here and denies any problems, no pain ,swelling, drainage from the site.   HPI  Past Medical History:  Diagnosis Date  . Atrial flutter ([place])   . Erectile dysfunction   . Tobacco abuse    Smokeless tobacco    Patient Active Problem List   Diagnosis Date Noted  . PULMONARY NODULE 08/18/2008  . COUGH 08/18/2008    Past Surgical History:  Procedure Laterality Date  . BACK SURGERY  1999        Home Medications    Prior to Admission medications   Medication Sig Start Date End Date Taking? Authorizing Provider  diltiazem (CARDIZEM CD) 120 MG 24 hr capsule Take 1 capsule (120 mg total) by mouth 2 (two) times daily. Pt overdue for an appt. Must call and schedule for further refills 1st attempt 12/13/16   Evans Lance, MD    Family History Family History  Problem Relation Age of Onset  . Heart attack Father   . Heart attack Mother     Social History Social History   Tobacco Use  . Smoking status: Never Smoker  . Smokeless tobacco: Current User    Types: Snuff  Substance Use Topics  . Alcohol use: Yes    Comment: Rare  . Drug use: No     Allergies   Codeine; Penicillin g; and Penicillins   Review of Systems Review of Systems  Constitutional: Negative for chills and fever.  Respiratory: Negative.   Cardiovascular: Negative.   Musculoskeletal: Negative.   Skin: Positive for wound.  Neurological: Negative for weakness and numbness.     Physical Exam Updated Vital Signs BP (!) 147/102 (BP Location: Right Arm)   Pulse 86   Temp 97.9 F (36.6 C) (Oral)   Resp 16   SpO2 98%   Physical Exam  Constitutional: He is oriented to person,  place, and time. He appears well-developed and well-nourished.  HENT:  Head: Normocephalic.  Cardiovascular: Normal rate.  Pulmonary/Chest: Effort normal.  Musculoskeletal: He exhibits no tenderness.  Neurological: He is alert and oriented to person, place, and time. No sensory deficit.  Skin: Laceration noted.  Well healed laceration right forearm.     ED Treatments / Results  Labs (all labs ordered are listed, but only abnormal results are displayed) Labs Reviewed - No data to display  EKG None  Radiology No results found.  Procedures Procedures (including critical care time)  SUTURE REMOVAL Performed by: Evalee Jefferson  Consent: Verbal consent obtained. Patient identity confirmed: provided demographic data Time out: Immediately prior to procedure a "time out" was called to verify the correct patient, procedure, equipment, support staff and site/side marked as required.  Location details: right forearm  Wound Appearance: clean  Sutures/Staples Removed: #5 sutures  Facility: sutures placed in this facility Patient tolerance: Patient tolerated the procedure well with no immediate complications.     Medications Ordered in ED Medications - No data to display   Initial Impression / Assessment and Plan / ED Course  I have reviewed the triage vital signs and the nursing notes.  Pertinent labs & imaging results that were available during my care of  the patient were reviewed by me and considered in my medical decision making (see chart for details).     Prn f/u anticipated  Final Clinical Impressions(s) / ED Diagnoses   Final diagnoses:  Visit for suture removal    ED Discharge Orders    None       Landis Martins 06/24/17 1618    Isla Pence, MD 06/24/17 [phone]

## 2017-06-24 NOTE — ED Notes (Signed)
Six sutures removed from right forearm.

## 2017-08-10 DIAGNOSIS — I10 Essential (primary) hypertension: Secondary | ICD-10-CM | POA: Diagnosis not present

## 2017-08-10 DIAGNOSIS — E559 Vitamin D deficiency, unspecified: Secondary | ICD-10-CM | POA: Diagnosis not present

## 2017-08-10 DIAGNOSIS — E785 Hyperlipidemia, unspecified: Secondary | ICD-10-CM | POA: Diagnosis not present

## 2017-08-10 DIAGNOSIS — Z0000 Encounter for general adult medical examination without abnormal findings: Secondary | ICD-10-CM | POA: Diagnosis not present

## 2017-08-24 DIAGNOSIS — E291 Testicular hypofunction: Secondary | ICD-10-CM | POA: Diagnosis not present

## 2017-08-30 DIAGNOSIS — D225 Melanocytic nevi of trunk: Secondary | ICD-10-CM | POA: Diagnosis not present

## 2017-08-30 DIAGNOSIS — D485 Neoplasm of uncertain behavior of skin: Secondary | ICD-10-CM | POA: Diagnosis not present

## 2017-08-30 DIAGNOSIS — L720 Epidermal cyst: Secondary | ICD-10-CM | POA: Diagnosis not present

## 2017-09-11 DIAGNOSIS — D485 Neoplasm of uncertain behavior of skin: Secondary | ICD-10-CM | POA: Diagnosis not present

## 2017-09-11 DIAGNOSIS — L988 Other specified disorders of the skin and subcutaneous tissue: Secondary | ICD-10-CM | POA: Diagnosis not present

## 2018-04-03 HISTORY — PX: MELANOMA EXCISION: SHX5266

## 2019-02-11 ENCOUNTER — Ambulatory Visit: Payer: Commercial Managed Care - PPO | Admitting: Podiatry

## 2019-02-11 ENCOUNTER — Other Ambulatory Visit: Payer: Self-pay

## 2019-02-11 ENCOUNTER — Encounter: Payer: Self-pay | Admitting: Podiatry

## 2019-02-11 VITALS — BP 140/87 | HR 76

## 2019-02-11 DIAGNOSIS — M79672 Pain in left foot: Secondary | ICD-10-CM

## 2019-02-11 DIAGNOSIS — M79671 Pain in right foot: Secondary | ICD-10-CM

## 2019-02-11 DIAGNOSIS — R262 Difficulty in walking, not elsewhere classified: Secondary | ICD-10-CM

## 2019-02-11 DIAGNOSIS — Q828 Other specified congenital malformations of skin: Secondary | ICD-10-CM | POA: Diagnosis not present

## 2019-02-11 DIAGNOSIS — Q667 Congenital pes cavus, unspecified foot: Secondary | ICD-10-CM | POA: Diagnosis not present

## 2019-02-11 NOTE — Patient Instructions (Signed)

## 2019-02-16 NOTE — Progress Notes (Signed)
Subjective: Peter Mcmillan presents today referred by Lois Huxley, PA with cc of painful, calluses b/l feet which interfere with daily activities. His wife is present during the visit.  He was last seen by our practice in 2015. Duration greater than one month.  Pain is aggravated when weightbearing with or without wearing enclosed shoe gear.   He is inquiring about orthotics on today. He has been to the [facility] and states he was quoted $1400 for a pair of inserts.  Past Medical History:  Diagnosis Date  . Atrial flutter ([place])   . Erectile dysfunction   . Tobacco abuse    Smokeless tobacco     Patient Active Problem List   Diagnosis Date Noted  . New onset atrial flutter ([place]) 12/09/2013  . PULMONARY NODULE 08/18/2008  . COUGH 08/18/2008     Past Surgical History:  Procedure Laterality Date  . BACK SURGERY  1999     Current Outpatient Medications on File Prior to Visit  Medication Sig Dispense Refill  . diltiazem (CARDIZEM CD) 120 MG 24 hr capsule Take 1 capsule (120 mg total) by mouth 2 (two) times daily. Pt overdue for an appt. Must call and schedule for further refills 1st attempt 60 capsule 0   No current facility-administered medications on file prior to visit.      Allergies  Allergen Reactions  . Codeine Other (See Comments)    agitation  . Penicillin G Other (See Comments)  . Penicillins Itching, Swelling and Rash     Social History   Occupational History  . Occupation: Clinical biochemist  Tobacco Use  . Smoking status: Never Smoker  . Smokeless tobacco: Current User    Types: Snuff  Substance and Sexual Activity  . Alcohol use: Yes    Comment: Rare  . Drug use: No  . Sexual activity: Not on file     Family History  Problem Relation Age of Onset  . Heart attack Father   . Heart attack Mother      Immunization History  Administered Date(s) Administered  . Tdap 06/13/2017     Review of systems: Positive Findings in bold  print.  Constitutional:  chills, fatigue, fever, sweats, weight change Communication: Optometrist, sign Ecologist, hand writing, iPad/Android device Head: headaches, head injury Eyes: changes in vision, eye pain, glaucoma, cataracts, macular degeneration, diplopia, glare,  light sensitivity, eyeglasses or contacts, blindness Ears nose mouth throat: hearing impaired, hearing aids,  ringing in ears, deaf, sign language,  vertigo,   nosebleeds,  rhinitis,  cold sores, snoring, swollen glands Cardiovascular: HTN, edema, arrhythmia, pacemaker in place, defibrillator in place, chest pain/tightness, chronic anticoagulation, blood clot, heart failure, MI Peripheral Vascular: leg cramps, varicose veins, blood clots, lymphedema, varicosities Respiratory:  difficulty breathing, denies congestion, SOB, wheezing, cough, emphysema Gastrointestinal: change in appetite or weight, abdominal pain, constipation, diarrhea, nausea, vomiting, vomiting blood, change in bowel habits, abdominal pain, jaundice, rectal bleeding, hemorrhoids, GERD Genitourinary:  nocturia,  pain on urination, polyuria,  blood in urine, Foley catheter, urinary urgency, ESRD on hemodialysis Musculoskeletal: amputation, cramping, stiff joints, painful joints, painful feet, decreased joint motion, fractures, OA, gout, hemiplegia, paraplegia, uses cane, wheelchair bound, uses walker, uses rollator Skin: +changes in toenails, color change, dryness, itching, mole changes, rash, wound(s) Neurological: headaches, numbness in feet, paresthesias in feet, burning in feet, fainting,  seizures, change in speech. denies headaches, memory problems/poor historian, cerebral palsy, weakness, paralysis, CVA, TIA Endocrine: diabetes, hypothyroidism, hyperthyroidism,  goiter, dry mouth, flushing, heat intolerance,  cold intolerance,  excessive thirst, denies polyuria,  nocturia Hematological:  easy bleeding, excessive bleeding, easy bruising, enlarged lymph  nodes, on long term blood thinner, history of past transusions Allergy/immunological:  hives, eczema, frequent infections, multiple drug allergies, seasonal allergies, transplant recipient, multiple food allergies Psychiatric:  anxiety, depression, mood disorder, suicidal ideations, hallucinations, insomnia  Objective: General 63 y.o. Caucasian male, WD, WN in NAD. AAO x 3.   Vitals:   02/11/19 0835  BP: 140/87  Pulse: 76    Vascular Examination: Capillary refill time immediate x 10 digits.  Dorsalis pedis pulses palpable b/l.   Posterior tibial pulses palpable b/l.   No digital hair x 10 digits.  Skin temperature gradient WNL b/l  Dermatological Examination: Skin with normal turgor, texture and tone b/l.  Toenails 1-5 adequate length on today's visit.  Porokeratotic lesions submet heads 1 b/l and submet head 5 right foot.  Musculoskeletal: Muscle strength 5/5 to all LE muscle groups.  Pes cavus foot deformity b/l.   Neurological: Sensation intact 5/5 b/l with 10 gram monofilament.  Vibratory sensation intact b/l.  Assessment: 1. 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 Pain in both feet 3. Pes Cavus foot deformity b/l 4. Difficulty ambulating  Plan: 1. Discussed pes cavus foot deformity.   Pedorthist evaluated and we will cast for custom orthotics for pes cavus and painful lesions. 2. Literature dispensed on today. 3. Porokeratosis submet head 1 b/l and submet head 5 right foot pared and enucleated with sterile scalpel blade without incident. Callus pad added to shoe insert. He was referred to our Pedorthist for further offloading of lesions as well as orthotic casting. 4. Patient to continue soft, supportive shoe gear daily. 5. Patient to report any pedal injuries to medical professional immediately. 6. Follow up 9 weeks. 7. Patient/POA to call should there be a concern in the interim.

## 2019-03-11 ENCOUNTER — Ambulatory Visit: Payer: Commercial Managed Care - PPO | Admitting: Orthotics

## 2019-03-11 ENCOUNTER — Other Ambulatory Visit: Payer: Self-pay

## 2019-03-11 DIAGNOSIS — M79672 Pain in left foot: Secondary | ICD-10-CM

## 2019-03-11 DIAGNOSIS — Q667 Congenital pes cavus, unspecified foot: Secondary | ICD-10-CM

## 2019-03-11 DIAGNOSIS — M79671 Pain in right foot: Secondary | ICD-10-CM

## 2019-03-11 NOTE — Patient Instructions (Signed)

## 2019-03-11 NOTE — Progress Notes (Signed)
Patient came in today to pick up custom made foot orthotics.  The goals were accomplished and the patient reported no dissatisfaction with said orthotics.  Patient was advised of breakin period and how to report any issues. 

## 2019-05-14 ENCOUNTER — Ambulatory Visit: Payer: Commercial Managed Care - PPO | Admitting: Podiatry

## 2019-12-17 ENCOUNTER — Ambulatory Visit
Admission: RE | Admit: 2019-12-17 | Discharge: 2019-12-17 | Disposition: A | Discharge status: Home / Self Care | Payer: Commercial Managed Care - PPO | Source: Ambulatory Visit | Attending: Family Medicine | Admitting: Family Medicine

## 2019-12-17 ENCOUNTER — Other Ambulatory Visit: Payer: Self-pay | Admitting: Family Medicine

## 2019-12-17 ENCOUNTER — Other Ambulatory Visit: Payer: Self-pay

## 2019-12-17 DIAGNOSIS — R053 Chronic cough: Secondary | ICD-10-CM

## 2020-03-26 ENCOUNTER — Telehealth: Payer: Self-pay | Admitting: Unknown Physician Specialty

## 2020-03-26 NOTE — Telephone Encounter (Signed)
Called to Discuss with patient about Covid symptoms and the use of the monoclonal antibody infusion for those with mild to moderate Covid symptoms and at a high risk of hospitalization.     Pt appears to qualify for this infusion due to co-morbid conditions and/or a member of an at-risk group in accordance with the FDA Emergency Use Authorization. However, he has been vaccinated and does not qualify at this time.

## 2020-04-03 ENCOUNTER — Emergency Department (HOSPITAL_COMMUNITY): Payer: Commercial Managed Care - PPO

## 2020-04-03 ENCOUNTER — Emergency Department (HOSPITAL_COMMUNITY)
Admission: EM | Admit: 2020-04-03 | Discharge: 2020-04-03 | Disposition: A | Discharge status: Home / Self Care | Payer: Commercial Managed Care - PPO | Attending: Emergency Medicine | Admitting: Emergency Medicine

## 2020-04-03 ENCOUNTER — Other Ambulatory Visit: Payer: Self-pay

## 2020-04-03 ENCOUNTER — Encounter (HOSPITAL_COMMUNITY): Payer: Self-pay | Admitting: *Deleted

## 2020-04-03 DIAGNOSIS — M79602 Pain in left arm: Secondary | ICD-10-CM | POA: Diagnosis present

## 2020-04-03 DIAGNOSIS — M25512 Pain in left shoulder: Secondary | ICD-10-CM | POA: Insufficient documentation

## 2020-04-03 DIAGNOSIS — M25511 Pain in right shoulder: Secondary | ICD-10-CM | POA: Insufficient documentation

## 2020-04-03 DIAGNOSIS — R531 Weakness: Secondary | ICD-10-CM | POA: Diagnosis not present

## 2020-04-03 DIAGNOSIS — R059 Cough, unspecified: Secondary | ICD-10-CM | POA: Diagnosis not present

## 2020-04-03 LAB — BASIC METABOLIC PANEL
Anion gap: 11 (ref 5–15)
BUN: 14 mg/dL (ref 8–23)
CO2: 24 mmol/L (ref 22–32)
Calcium: 8.8 mg/dL — ABNORMAL LOW (ref 8.9–10.3)
Chloride: 103 mmol/L (ref 98–111)
Creatinine, Ser: 0.81 mg/dL (ref 0.61–1.24)
GFR, Estimated: >60 mL/min (ref >60)
Glucose, Bld: 110 mg/dL — ABNORMAL HIGH (ref 70–99)
Potassium: 4.1 mmol/L (ref 3.5–5.1)
Sodium: 138 mmol/L (ref 135–145)

## 2020-04-03 LAB — CBC
HCT: 44.7 % (ref 39.0–52.0)
Hemoglobin: 15.7 g/dL (ref 13.0–17.0)
MCH: 31.3 pg (ref 26.0–34.0)
MCHC: 35.1 g/dL (ref 30.0–36.0)
MCV: 89.2 fL (ref 80.0–100.0)
Platelets: 281 10*3/uL (ref 150–400)
RBC: 5.01 MIL/uL (ref 4.22–5.81)
RDW: 11.9 % (ref 11.5–15.5)
WBC: 6.7 10*3/uL (ref 4.0–10.5)
nRBC: 0 % (ref 0.0–0.2)

## 2020-04-03 LAB — CK: Total CK: 108 U/L (ref 49–397)

## 2020-04-03 LAB — TROPONIN I (HIGH SENSITIVITY)
Troponin I (High Sensitivity): 6 ng/L (ref <18)
Troponin I (High Sensitivity): 5 ng/L (ref <18)

## 2020-04-03 MED ORDER — KETOROLAC TROMETHAMINE 30 MG/ML IJ SOLN
30.0000 mg | Freq: Once | INTRAMUSCULAR | Status: AC
  Administered 2020-04-03: 30 mg via INTRAVENOUS
  Filled 2020-04-03: qty 1

## 2020-04-03 MED ORDER — NAPROXEN 375 MG PO TABS: 375.0000 mg | ORAL_TABLET | Freq: Two times a day (BID) | ORAL | 0 refills | Status: AC

## 2020-04-03 NOTE — ED Notes (Signed)
Patient verbalizes understanding of discharge instructions. Prescriptions reviewed. Opportunity for questioning and answers were provided. Armband removed by staff, pt discharged from ED ambulatory.

## 2020-04-03 NOTE — ED Triage Notes (Signed)
The pt just recovering from covid since December the 9th

## 2020-04-03 NOTE — Discharge Instructions (Addendum)
You may alternate taking Tylenol and Naproxen as needed for pain control. You may take Naproxen twice daily as directed on your discharge paperwork and you may take  [id number] mg of Tylenol every 6 hours. Do not exceed 4000 mg of Tylenol daily as this can lead to liver damage. Also, make sure to take Naproxen with meals as it can cause an upset stomach. Do not take other NSAIDs while taking Naproxen such as (Aleve, Ibuprofen, Aspirin, Celebrex, etc) and do not take more than the prescribed dose as this can lead to ulcers and bleeding in your GI tract. You may use warm and cold compresses to help with your symptoms.   Please follow up with your primary doctor within the next 7-10 days for re-evaluation and further treatment of your symptoms.   Please return to the ER sooner if you have any new or worsening symptoms.

## 2020-04-03 NOTE — ED Triage Notes (Signed)
The pt arrived by gems from home the pt woke up around 0030 c/o bi-lateral arm pain he has had similar symptoms in the past with af  But now he has nsr no chest pain or sob

## 2020-04-03 NOTE — ED Provider Notes (Signed)
[facility] Provider Note   CSN: [id number] Arrival date & time: 04/03/20  0139     History Chief Complaint  Patient presents with  . Chest Pain    Peter Mcmillan is a 65 y.o. male.  HPI   Pt is a 65 y/o male with a h/o aflutter, ED, tobacco use, who presents to the ED today for eval for left arm pain. States that the pain started yesterday morning. The pain starts for his fingers and goes all the way up to his neck/chest. He reports some tingling in the left arm as well as some weakness. He is now also c/o pain to the right shoulder as well. The pain feels like a burning and stabbing pain. The pain is worse when raising the arm up. The pain is improved at rest. He denies chest pain, shortness of breath.   Noreene Filbert for his symptoms.   He was just diagnosed with covid recently and came out of quarantine yesterday. He has had an intermittent cough.   Past Medical History:  Diagnosis Date  . Atrial flutter ([place])   . Erectile dysfunction   . Tobacco abuse    Smokeless tobacco    Patient Active Problem List   Diagnosis Date Noted  . New onset atrial flutter ([place]) 12/09/2013  . PULMONARY NODULE 08/18/2008  . COUGH 08/18/2008    Past Surgical History:  Procedure Laterality Date  . BACK SURGERY  1999       Family History  Problem Relation Age of Onset  . Heart attack Father   . Heart attack Mother     Social History   Tobacco Use  . Smoking status: Never Smoker  . Smokeless tobacco: Current User    Types: Snuff  Substance Use Topics  . Alcohol use: Yes    Comment: Rare  . Drug use: No    Home Medications Prior to Admission medications   Medication Sig Start Date End Date Taking? Authorizing Provider  naproxen (NAPROSYN) 375 MG tablet Take 1 tablet (375 mg total) by mouth 2 (two) times daily for 7 days. 04/03/20 04/10/20 Yes Danton Palmateer S, PA-C  diltiazem (CARDIZEM CD) 120 MG 24 hr capsule Take 1 capsule (120 mg  total) by mouth 2 (two) times daily. Pt overdue for an appt. Must call and schedule for further refills 1st attempt 12/13/16   Evans Lance, MD    Allergies    Codeine, Penicillin g, and Penicillins  Review of Systems   Review of Systems  Constitutional: Negative for chills and fever.  HENT: Negative for ear pain and sore throat.   Eyes: Negative for pain and visual disturbance.  Respiratory: Negative for cough and shortness of breath.   Cardiovascular: Negative for chest pain.  Gastrointestinal: Negative for abdominal pain, constipation, diarrhea, nausea and vomiting.  Genitourinary: Negative for dysuria and hematuria.  Musculoskeletal: Negative for back pain.       Bilat shoulder pain  Skin: Negative for rash.  Neurological: Positive for weakness. Negative for seizures, syncope and numbness.       Tingling to LUE  All other systems reviewed and are negative.   Physical Exam Updated Vital Signs BP (!) 136/93   Pulse 62   Temp 97.7 F (36.5 C) (Oral)   Resp 12   Ht 6\' 5"  (1.956 m)   Wt 102.1 kg   SpO2 96%   BMI 26.69 kg/m   Physical Exam Vitals and nursing note reviewed.  Constitutional:  Appearance: He is well-developed and well-nourished.  HENT:     Head: Normocephalic and atraumatic.  Eyes:     Conjunctiva/sclera: Conjunctivae normal.  Cardiovascular:     Rate and Rhythm: Normal rate and regular rhythm.     Pulses:          Radial pulses are 2+ on the right side and 2+ on the left side.     Heart sounds: Normal heart sounds. No murmur heard.   Pulmonary:     Effort: Pulmonary effort is normal. No respiratory distress.     Breath sounds: Normal breath sounds. No decreased breath sounds, wheezing, rhonchi or rales.  Abdominal:     General: Bowel sounds are normal.     Palpations: Abdomen is soft.     Tenderness: There is no abdominal tenderness. There is no guarding or rebound.  Musculoskeletal:        General: No edema.     Cervical back: Neck  supple.     Comments: TTP to the left shoulder, with focal ttp at the biceps tendon. Also has muscular TTP to the left bicep, left pec, left trapezius muscle. Additionally more mild ttp to the right anterior shoulder and proximal bicep. No erythema, warmth or swelling to the bilat shoulders.  Skin:    General: Skin is warm and dry.  Neurological:     Mental Status: He is alert.     Comments: Strength to bue intact  Psychiatric:        Mood and Affect: Mood and affect normal.     ED Results / Procedures / Treatments   Labs (all labs ordered are listed, but only abnormal results are displayed) Labs Reviewed  BASIC METABOLIC PANEL - Abnormal; Notable for the following components:      Result Value   Glucose, Bld 110 (*)    Calcium 8.8 (*)    All other components within normal limits  CBC  CK  TROPONIN I (HIGH SENSITIVITY)  TROPONIN I (HIGH SENSITIVITY)    EKG EKG Interpretation  Date/Time:  Saturday April 03 2020 01:42:16 EST Ventricular Rate:  77 PR Interval:  142 QRS Duration: 80 QT Interval:  374 QTC Calculation: 423 R Axis:   9 Text Interpretation: Sinus rhythm with marked sinus arrhythmia Cannot rule out Anterior infarct , age undetermined Abnormal ECG Confirmed by Gilda Crease [phone]) on 04/03/2020 5:25:56 AM   Radiology DG Chest 2 View  Result Date: 04/03/2020 CLINICAL DATA:  Chest pain EXAM: CHEST - 2 VIEW COMPARISON:  12/17/2019 FINDINGS: The heart size and mediastinal contours are within normal limits. Both lungs are clear. The visualized skeletal structures are unremarkable. IMPRESSION: No active cardiopulmonary disease. Electronically Signed   By: Helyn Numbers MD   On: 04/03/2020 02:26   DG Shoulder Left  Result Date: 04/03/2020 CLINICAL DATA:  Arm pain EXAM: LEFT SHOULDER - 2+ VIEW COMPARISON:  None. FINDINGS: There is no evidence of fracture or dislocation. There is no evidence of arthropathy or other focal bone abnormality. Soft tissues are  unremarkable. IMPRESSION: Negative. Electronically Signed   By: Deatra Robinson M.D.   On: 04/03/2020 04:29    Procedures Procedures (including critical care time)  Medications Ordered in ED Medications  ketorolac (TORADOL) 30 MG/ML injection 30 mg (30 mg Intravenous Given 04/03/20 0443)    ED Course  I have reviewed the triage vital signs and the nursing notes.  Pertinent labs & imaging results that were available during my care of the patient were  reviewed by me and considered in my medical decision making (see chart for details).    MDM Rules/Calculators/A&P                          65 year old male presenting the emergency department today complaining of bilateral shoulder pain.  Recently diagnosed with Covid but recovered at this time.  Denies any chest pain, shortness of breath.  Pain seems MSK in nature and is worse with movement in certain positions.  Also worse with palpation.  Reviewed/interpreted lab CBC, BMP, initial troponin are unremarkable.  Delta troponin neg.  CK neg.  EKG  With nsr with marked sinus arrhythmia and age unddetermined anterior infarct  cxr neg Xray left shoulder unremarkable  Pt given a dose of toradol in the ED. work-up today is reassuring.  Suspect that he has an inflammatory MSK process.  Doubt cardiac or other emergent cause.  Will have patient follow-up with Ortho.  Will give anti-inflammatories for home.  Advised on follow-up and return precautions.  Patient seen in conjunction with supervising physician, Dr. Levora Angel who personally evaluated patient and is in agreement with plan.   Final Clinical Impression(s) / ED Diagnoses Final diagnoses:  Bilateral shoulder pain, unspecified chronicity    Rx / DC Orders ED Discharge Orders         Ordered    naproxen (NAPROSYN) 375 MG tablet  2 times daily        04/03/20 0634           Rodney Booze, PA-C 04/03/20 [id number]    Orpah Greek, MD 04/03/20 [phone]

## 2020-04-30 ENCOUNTER — Ambulatory Visit: Payer: Self-pay

## 2020-04-30 ENCOUNTER — Ambulatory Visit (INDEPENDENT_AMBULATORY_CARE_PROVIDER_SITE_OTHER): Payer: Commercial Managed Care - PPO | Admitting: Orthopaedic Surgery

## 2020-04-30 ENCOUNTER — Encounter: Payer: Self-pay | Admitting: Orthopaedic Surgery

## 2020-04-30 DIAGNOSIS — M25512 Pain in left shoulder: Secondary | ICD-10-CM

## 2020-04-30 DIAGNOSIS — M25561 Pain in right knee: Secondary | ICD-10-CM | POA: Diagnosis not present

## 2020-04-30 DIAGNOSIS — G8929 Other chronic pain: Secondary | ICD-10-CM

## 2020-04-30 DIAGNOSIS — M25511 Pain in right shoulder: Secondary | ICD-10-CM

## 2020-04-30 DIAGNOSIS — M25562 Pain in left knee: Secondary | ICD-10-CM | POA: Diagnosis not present

## 2020-04-30 NOTE — Progress Notes (Signed)
Office Visit Note   Patient: Peter Mcmillan           Date of Birth: 19-Mar-1956           MRN: [id number] Visit Date: 04/30/2020              Requested by: Lois Huxley, [address] [place],  [place] [address] PCP: Lois Huxley, [place]   Assessment & Plan: Visit Diagnoses:  1. Chronic pain of right knee   2. Chronic right shoulder pain   3. Chronic pain of left knee   4. Chronic left shoulder pain     Plan: Impression is multiple joint pain following COVID-19 infection which has significantly improved over the past few weeks.  Today, we discussed obtaining lab work to look for autoimmune disease to see if this was a contributing factor.  We will call him with the results.  He will follow up with [place] as needed.  Follow-Up Instructions: Return if symptoms worsen or fail to improve.   Orders:  Orders Placed This Encounter  Procedures  . XR Knee 1-2 Views Right  . XR Shoulder Right  . XR Knee 1-2 Views Left   No orders of the defined types were placed in this encounter.     Procedures: No procedures performed   Clinical Data: No additional findings.   Subjective: Chief Complaint  Patient presents with  . Right Knee - Pain  . Right Shoulder - Pain  . Left Shoulder - Pain  . Left Knee - Pain  . Left Ankle - Pain  . Right Ankle - Pain    HPI patient is a pleasant 65 year old gentleman who comes in today with multiple joint complaints.  His main complaints today are both shoulders and both knees.  This began approximately 1 week after recovering from Covid over Christmas.  His pain is actually significantly improved but does have intermittent aches and stiffness to both knees and both shoulders.  There are no specific aggravators.  His primary care provider initially prescribed him prednisone which helped quite a bit.  He has since been taking Tylenol on occasion without significant relief.  He denies any paresthesias.  No personal or family history of  autoimmune disease.  Review of Systems as detailed in HPI.  All others reviewed and are negative.   Objective: Vital Signs: There were no vitals taken for this visit.  Physical Exam well-nourished gentleman in no acute distress.  Alert oriented x3.  Ortho Exam bilateral shoulder exam shows full range of motion and strength without pain.  Bilateral knee exam shows range of motion of 0 to 125 degrees.  No joint line tenderness.  Ligaments are stable.  He is neurovascular intact distally.  Specialty Comments:  No specialty comments available.  Imaging: XR Knee 1-2 Views Left  Result Date: 04/30/2020 Joint space narrowing medial compartment  XR Knee 1-2 Views Right  Result Date: 04/30/2020 Joint space narrowing medial compartment  XR Shoulder Right  Result Date: 04/30/2020 Moderate degenerative changes to the [facility] joint    PMFS History: Patient Active Problem List   Diagnosis Date Noted  . New onset atrial flutter ([place]) 12/09/2013  . PULMONARY NODULE 08/18/2008  . COUGH 08/18/2008   Past Medical History:  Diagnosis Date  . Atrial flutter ([place])   . Erectile dysfunction   . Tobacco abuse    Smokeless tobacco    Family History  Problem Relation Age of Onset  . Heart attack  Father   . Heart attack Mother     Past Surgical History:  Procedure Laterality Date  . [place]   Social History   Occupational History  . Occupation: Clinical biochemist  Tobacco Use  . Smoking status: Never Smoker  . Smokeless tobacco: Current User    Types: Snuff  Substance and Sexual Activity  . Alcohol use: Yes    Comment: Rare  . Drug use: No  . Sexual activity: Not on file

## 2020-05-03 ENCOUNTER — Other Ambulatory Visit: Payer: Self-pay

## 2020-05-03 DIAGNOSIS — R768 Other specified abnormal immunological findings in serum: Secondary | ICD-10-CM

## 2020-05-03 LAB — ANTI-NUCLEAR AB-TITER (ANA TITER): ANA Titer 1: 1:80 {titer} — ABNORMAL HIGH

## 2020-05-03 LAB — C-REACTIVE PROTEIN: CRP: 4.4 mg/L (ref <8.0)

## 2020-05-03 LAB — RHEUMATOID FACTOR: Rhuematoid fact SerPl-aCnc: <14 IU/mL (ref <14)

## 2020-05-03 LAB — SEDIMENTATION RATE: Sed Rate: 14 mm/h (ref 0–20)

## 2020-05-03 LAB — ANA: Anti Nuclear Antibody (ANA): POSITIVE — AB

## 2020-05-03 LAB — URIC ACID: Uric Acid, Serum: 5.7 mg/dL (ref 4.0–8.0)

## 2020-05-03 NOTE — Progress Notes (Signed)
Needs referral to rheumatology

## 2020-06-11 ENCOUNTER — Ambulatory Visit: Payer: Self-pay

## 2020-06-11 ENCOUNTER — Encounter: Payer: Self-pay | Admitting: Internal Medicine

## 2020-06-11 ENCOUNTER — Ambulatory Visit (INDEPENDENT_AMBULATORY_CARE_PROVIDER_SITE_OTHER): Payer: Commercial Managed Care - PPO | Admitting: Internal Medicine

## 2020-06-11 ENCOUNTER — Other Ambulatory Visit: Payer: Self-pay

## 2020-06-11 VITALS — BP 150/106 | HR 61 | Ht 76.0 in | Wt 221.8 lb

## 2020-06-11 DIAGNOSIS — M79642 Pain in left hand: Secondary | ICD-10-CM

## 2020-06-11 DIAGNOSIS — E559 Vitamin D deficiency, unspecified: Secondary | ICD-10-CM

## 2020-06-11 DIAGNOSIS — J301 Allergic rhinitis due to pollen: Secondary | ICD-10-CM | POA: Insufficient documentation

## 2020-06-11 DIAGNOSIS — K219 Gastro-esophageal reflux disease without esophagitis: Secondary | ICD-10-CM | POA: Insufficient documentation

## 2020-06-11 DIAGNOSIS — M25511 Pain in right shoulder: Secondary | ICD-10-CM

## 2020-06-11 DIAGNOSIS — E785 Hyperlipidemia, unspecified: Secondary | ICD-10-CM | POA: Insufficient documentation

## 2020-06-11 DIAGNOSIS — M79641 Pain in right hand: Secondary | ICD-10-CM | POA: Diagnosis not present

## 2020-06-11 DIAGNOSIS — N529 Male erectile dysfunction, unspecified: Secondary | ICD-10-CM | POA: Insufficient documentation

## 2020-06-11 DIAGNOSIS — R768 Other specified abnormal immunological findings in serum: Secondary | ICD-10-CM | POA: Insufficient documentation

## 2020-06-11 DIAGNOSIS — R7303 Prediabetes: Secondary | ICD-10-CM | POA: Insufficient documentation

## 2020-06-11 DIAGNOSIS — C4359 Malignant melanoma of other part of trunk: Secondary | ICD-10-CM | POA: Insufficient documentation

## 2020-06-11 DIAGNOSIS — Z720 Tobacco use: Secondary | ICD-10-CM | POA: Insufficient documentation

## 2020-06-11 DIAGNOSIS — M25512 Pain in left shoulder: Secondary | ICD-10-CM | POA: Insufficient documentation

## 2020-06-11 DIAGNOSIS — G8929 Other chronic pain: Secondary | ICD-10-CM

## 2020-06-11 DIAGNOSIS — I10 Essential (primary) hypertension: Secondary | ICD-10-CM | POA: Insufficient documentation

## 2020-06-11 NOTE — Progress Notes (Signed)
Office Visit Note  Patient: Peter Mcmillan             Date of Birth: 1955-05-02           MRN: [id number]             PCP: Lois Huxley, PA Referring: Leandrew Koyanagi, MD Visit Date: 06/11/2020 Occupation: Electrician  Subjective:  New Patient (Initial Visit) (Patient complains of onset of joint aches and pains after having COVID in December 2021. Patient complains of bilateral shoulder, bilateral knee, and bilateral hand pain, swelling, and stiffness.)   History of Present Illness: Peter Mcmillan is a 65 y.o. male here for evaluation of positive ANA with increased pain in bilateral shoulders and knees. He was sick with COVID in December with symptoms of loss of taste and smell and then some body aches these resolved within a few weeks. Afterwards he developed pain and stiffness in both shoulders and in both knees that persisted every day, with worse stiffness and swelling especially in the mornings. He saw Dr. Erlinda Hong in January with xrays and labs showing mild to moderate amount of osteoarthritis but also positive ANA antibodies. In February he took a tapering course of prednisone for about 12 days this improved his symptoms while on the medicine but then worsening again quickly after stopping. He has noticed some improvement in the knee pain, but now his shoulders and hands are the most affected areas. He is taking 2 aleve daily this partially improves symptoms. He notices some numbness in his toes but otherwise no weakness or sensation change. He denies any fevers, alopecia, oral ulcers, rashes, lymphadenopathy.  Labs reviewed ANA 1:80 DFS RF neg ESR wnl CRP neg Uric acid wnl CK wnl  Activities of Daily Living:  Patient reports morning stiffness for several hours.   Patient Denies nocturnal pain.  Difficulty dressing/grooming: Reports Difficulty climbing stairs: Denies Difficulty getting out of chair: Denies Difficulty using hands for taps, buttons, cutlery, and/or writing:  Reports  Review of Systems  Constitutional: Positive for fatigue.  HENT: Positive for mouth dryness. Negative for mouth sores and nose dryness.   Eyes: Negative for pain, itching, visual disturbance and dryness.  Respiratory: Positive for cough, shortness of breath and difficulty breathing. Negative for hemoptysis.   Cardiovascular: Positive for swelling in legs/feet. Negative for chest pain and palpitations.  Gastrointestinal: Negative for abdominal pain, blood in stool, constipation and diarrhea.  Endocrine: Negative for increased urination.  Genitourinary: Negative for painful urination.  Musculoskeletal: Positive for arthralgias, joint pain, joint swelling, myalgias, muscle weakness, morning stiffness, muscle tenderness and myalgias.  Skin: Positive for color change. Negative for rash and redness.  Allergic/Immunologic: Negative for susceptible to infections.  Neurological: Positive for numbness. Negative for dizziness, headaches, memory loss and weakness.  Hematological: Negative for swollen glands.  Psychiatric/Behavioral: Negative for confusion and sleep disturbance.    PMFS History:  Patient Active Problem List   Diagnosis Date Noted  . Allergic rhinitis due to pollen 06/11/2020  . 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 Essential hypertension 06/11/2020  . Gastro-esophageal reflux disease without esophagitis 06/11/2020  . Hyperlipidemia 06/11/2020  . 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 Prediabetes 06/11/2020  . Tobacco user 06/11/2020  . Vitamin D deficiency 06/11/2020  . Bilateral hand pain 06/11/2020  . Positive ANA (antinuclear antibody) 06/11/2020  . Bilateral shoulder pain 06/11/2020  . Atrial flutter ([place]) 12/09/2013  . PULMONARY NODULE 08/18/2008  . COUGH 08/18/2008    Past  Medical History:  Diagnosis Date  . Atrial flutter ([place])   . Erectile dysfunction   . Tobacco abuse    Smokeless tobacco    Family History  Problem  Relation Age of Onset  . Heart attack Father   . Heart attack Mother   . Dementia Mother   . Healthy Sister   . Healthy Son    Past Surgical History:  Procedure Laterality Date  . BACK SURGERY  1999  . MELANOMA EXCISION  2020   Social History   Social History Narrative  . Not on file   Immunization History  Administered Date(s) Administered  . Hep A / Hep B 02/17/2011  . Influenza Split 01/29/2017  . Moderna Sars-Covid-2 Vaccination 07/09/2019, 07/09/2019, 08/06/2019, 08/06/2019  . Pneumococcal Polysaccharide-23 02/12/2009  . Tdap 02/12/2009, 06/13/2017     Objective: Vital Signs: BP (!) 150/106 (BP Location: Right Arm, Patient Position: Sitting, Cuff Size: Normal)   Pulse 61   Ht $R'6\' 4"'nn$  (1.93 m)   Wt 221 lb 12.8 oz (100.6 kg)   BMI 27.00 kg/m    Physical Exam HENT:     Right Ear: External ear normal.     Left Ear: External ear normal.     Mouth/Throat:     Mouth: Mucous membranes are moist.     Pharynx: Oropharynx is clear.  Eyes:     Conjunctiva/sclera: Conjunctivae normal.  Cardiovascular:     Rate and Rhythm: Normal rate and regular rhythm.  Pulmonary:     Effort: Pulmonary effort is normal.     Breath sounds: Normal breath sounds.  Skin:    General: Skin is warm and dry.     Findings: No rash.  Neurological:     General: No focal deficit present.     Mental Status: He is alert.     Deep Tendon Reflexes: Reflexes normal.  Psychiatric:        Mood and Affect: Mood normal.      Musculoskeletal Exam:  Neck full ROM muscle pain provoked with lateral rotation b/l Shoulders full ROM, pain and tenderness around bicipital groove on left with ROM especially abduction and with palpation Elbows full ROM no tenderness or swelling Wrists full ROM no tenderness or swelling Fingers swelling of 3rd MCP and PIP on right hand, right hand MCPs partially subluxation and lateral deviation, left 2nd PIP slight swelling, 3rd MCP and PIP swelling and tenderness, unable to  tightly close fist with 2-3rd digits, few DIP heberdon's nodes No tenderness to lateral hip palpation Knees full ROM no tenderness or swelling Ankles full ROM no tenderness or swelling MTPs calluses at 5th MTP laterally tender otherwise no swelling or tenderness   CDAI Exam: CDAI Score: 10  Patient Global: 5 mm; Provider Global: 5 mm Swollen: 5 ; Tender: 4  Joint Exam 06/11/2020      Right  Left  Glenohumeral   Tender   Tender  MCP 3  Swollen   Swollen Tender  PIP 2     Swollen   PIP 3  Swollen   Swollen Tender     Investigation: No additional findings.  Imaging: XR Hand 2 View Left  Result Date: 06/11/2020 X-ray left hand 2 views Radiocarpal joint space appears normal.  Mild arthritic change of first [facility] joint with preserved joint space.  MCP, PIP, and DIP joint spaces appear normal.  Slight fifth DIP osteophyte formation.  Bone mineralization appears normal. Impression Mild degenerative changes seen no demineralization or erosions to indicate inflammatory  joint changes  XR Hand 2 View Right  Result Date: 06/11/2020 X-ray right hand 2 views Radiocarpal joint space appears normal.  Fifth metacarpal shows volar deviation consistent with remote healed fracture.  PIP and DIP joints intact with minimal joint space narrowing no osteophytes or erosions.  Bone mineralization appears normal. Impression Mild degenerative changes, no demineralization or erosions to suggest inflammatory disease changes   Recent Labs: Lab Results  Component Value Date   WBC 6.7 04/03/2020   HGB 15.7 04/03/2020   PLT 281 04/03/2020   NA 138 04/03/2020   K 4.1 04/03/2020   CL 103 04/03/2020   CO2 24 04/03/2020   GLUCOSE 110 (H) 04/03/2020   BUN 14 04/03/2020   CREATININE 0.81 04/03/2020   CALCIUM 8.8 (L) 04/03/2020   GFRAA >60 10/31/2015    Speciality Comments: No specialty comments available.  Procedures:  No procedures performed Allergies: Codeine, Penicillin g, and Penicillins    Assessment / Plan:     Visit Diagnoses: Positive ANA (antinuclear antibody) - Plan: RNP Antibody, Anti-Smith antibody, Sjogrens syndrome-A extractable nuclear antibody, Sj reactive positive antibodies.  Ogrens syndrome-B extractable nuclear antibody, Anti-DNA antibody, double-stranded, C3 and C4, Cyclic citrul peptide antibody, IgG, Sedimentation rate  Positive ANA associated with arthritis but no additional systemic manifestations no other clinical criteria for systemic lupus.  Low titer and dense fine stranded pattern are less commonly predictive for primary systemic connective tissue disease.  We will check RNP, Smith, SSA, SSB, dsDNA, complements, and CCP antibodies.  Low pretest suspicion for systemic connective tissue disease, could be reactive positive antibodies.  If this looks like a reactive arthritis recommend low-dose corticosteroid treatment for a few months since he has incomplete relief with NSAIDs.  Bilateral hand pain - Plan: XR Hand 2 View Right, XR Hand 2 View Left  Bilateral hand pain and inflammation becoming worse recently while the knee pain inflammation has gotten somewhat better.  Recheck hand x-rays in clinic they show chronic changes from remote fifth metacarpal fracture and mild osteoarthritic changes involving the first [facility] joint but no significant inflammatory damage or demineralization.  Chronic pain of both shoulders  Bilateral shoulder pain localizes for possible supraspinatus or biceps tendon inflammation.  Suspect current exacerbation is part of the same problem but could consider local imaging if this fails to improve.  Vitamin D deficiency - Plan: VITAMIN D 25 Hydroxy (Vit-D Deficiency, Fractures)  He has history of vitamin D deficiency takes high-dose supplementation only about once per 2 weeks due to it burning his stomach.  We will check vitamin D level today for recommendation on replacement also since he may be at risk for glucocorticoid induced osteopenia  if taking steroids for few months  Orders: Orders Placed This Encounter  Procedures  . XR Hand 2 View Right  . XR Hand 2 View Left  . RNP Antibody  . Anti-Smith antibody  . Sjogrens syndrome-A extractable nuclear antibody  . Sjogrens syndrome-B extractable nuclear antibody  . Anti-DNA antibody, double-stranded  . C3 and C4  . Cyclic citrul peptide antibody, IgG  . Sedimentation rate  . VITAMIN D 25 Hydroxy (Vit-D Deficiency, Fractures)   No orders of the defined types were placed in this encounter.    Follow-Up Instructions: No follow-ups on file.   Collier Salina, MD  Note - This record has been created using [employer].  Chart creation errors have been sought, but may not always  have been located. Such creation errors do not reflect on  the standard of medical care.

## 2020-06-11 NOTE — Patient Instructions (Addendum)
If symptoms and physical changes of an inflammatory arthritis.  We are checking several antibody test for evidence whether this looks like a ongoing autoimmune problem or may be a reactive arthritis.  Prednisone tablets What is this medicine? PREDNISONE (PRED ni sone) is a corticosteroid. It is commonly used to treat inflammation of the skin, joints, lungs, and other organs. Common conditions treated include asthma, allergies, and arthritis. It is also used for other conditions, such as blood disorders and diseases of the adrenal glands. This medicine may be used for other purposes; ask your health care provider or pharmacist if you have questions. COMMON BRAND NAME(S): Deltasone, Predone, Sterapred, Sterapred DS What should I tell my health care provider before I take this medicine? They need to know if you have any of these conditions:  Cushing's syndrome  diabetes  glaucoma  heart disease  high blood pressure  infection (especially a virus infection such as chickenpox, cold sores, or herpes)  kidney disease  liver disease  mental illness  myasthenia gravis  osteoporosis  seizures  stomach or intestine problems  thyroid disease  an unusual or allergic reaction to lactose, prednisone, other medicines, foods, dyes, or preservatives  pregnant or trying to get pregnant  breast-feeding How should I use this medicine? Take this medicine by mouth with a glass of water. Follow the directions on the prescription label. Take this medicine with food. If you are taking this medicine once a day, take it in the morning. Do not take more medicine than you are told to take. Do not suddenly stop taking your medicine because you may develop a severe reaction. Your doctor will tell you how much medicine to take. If your doctor wants you to stop the medicine, the dose may be slowly lowered over time to avoid any side effects. Talk to your pediatrician regarding the use of this medicine in  children. Special care may be needed. Overdosage: If you think you have taken too much of this medicine contact a poison control center or emergency room at once. NOTE: This medicine is only for you. Do not share this medicine with others. What if I miss a dose? If you miss a dose, take it as soon as you can. If it is almost time for your next dose, talk to your doctor or health care professional. You may need to miss a dose or take an extra dose. Do not take double or extra doses without advice. What may interact with this medicine? Do not take this medicine with any of the following medications:  metyrapone  mifepristone This medicine may also interact with the following medications:  aminoglutethimide  amphotericin B  aspirin and aspirin-like medicines  barbiturates  certain medicines for diabetes, like glipizide or glyburide  cholestyramine  cholinesterase inhibitors  cyclosporine  digoxin  diuretics  ephedrine  male hormones, like estrogens and birth control pills  isoniazid  ketoconazole  NSAIDS, medicines for pain and inflammation, like ibuprofen or naproxen  phenytoin  rifampin  toxoids  vaccines  warfarin This list may not describe all possible interactions. Give your health care provider a list of all the medicines, herbs, non-prescription drugs, or dietary supplements you use. Also tell them if you smoke, drink alcohol, or use illegal drugs. Some items may interact with your medicine. What should I watch for while using this medicine? Visit your doctor or health care professional for regular checks on your progress. If you are taking this medicine over a prolonged period, carry an identification  card with your name and address, the type and dose of your medicine, and your doctor's name and address. This medicine may increase your risk of getting an infection. Tell your doctor or health care professional if you are around anyone with measles or  chickenpox, or if you develop sores or blisters that do not heal properly. If you are going to have surgery, tell your doctor or health care professional that you have taken this medicine within the last twelve months. Ask your doctor or health care professional about your diet. You may need to lower the amount of salt you eat. This medicine may increase blood sugar. Ask your healthcare provider if changes in diet or medicines are needed if you have diabetes. What side effects may I notice from receiving this medicine? Side effects that you should report to your doctor or health care professional as soon as possible:  allergic reactions like skin rash, itching or hives, swelling of the face, lips, or tongue  changes in emotions or moods  changes in vision  depressed mood  eye pain  fever or chills, cough, sore throat, pain or difficulty passing urine  signs and symptoms of high blood sugar such as being more thirsty or hungry or having to urinate more than normal. You may also feel very tired or have blurry vision.  swelling of ankles, feet Side effects that usually do not require medical attention (report to your doctor or health care professional if they continue or are bothersome):  confusion, excitement, restlessness  headache  nausea, vomiting  skin problems, acne, thin and shiny skin  trouble sleeping  weight gain This list may not describe all possible side effects. Call your doctor for medical advice about side effects. You may report side effects to FDA at 1-800-FDA-1088. Where should I keep my medicine? Keep out of the reach of children. Store at room temperature between 15 and 30 degrees C (59 and 86 degrees F). Protect from light. Keep container tightly closed. Throw away any unused medicine after the expiration date.    Antinuclear Antibody Test Why am I having this test? This is a test that is used to help diagnose systemic lupus erythematosus (SLE) and other  autoimmune diseases. An autoimmune disease is a disease in which the body's own defense (immune)system attacks its organs. What is being tested? This test checks for antinuclear antibodies (ANA) in the blood. The presence of ANA is associated with several autoimmune diseases. It is seen in almost all patients with lupus. What kind of sample is taken? A blood sample is required for this test. It is usually collected by inserting a needle into a blood vessel.   How are the results reported? Your test results will be reported as either positive or negative. A false-positive result can occur. A false positive is incorrect because it means that a condition is present when it is not. What do the results mean? A positive test result may mean that you have:  Lupus.  Other autoimmune diseases, such as rheumatoid arthritis, scleroderma, or Sjgren syndrome. Conditions that may cause a false-positive result include:  Liver dysfunction.  Myasthenia gravis.  Infectious mononucleosis. Talk with your health care provider about what your results mean. Questions to ask your health care provider Ask your health care provider, or the department that is doing the test:  When will my results be ready?  How will I get my results?  What are my treatment options?  What other tests do I need?  What are my next steps? Summary  This is a test that is used to help diagnose systemic lupus erythematosus (SLE) and other autoimmune diseases. An autoimmune disease is a disease in which the body's own defense (immune)system attacks the body.  This test checks for antinuclear antibodies (ANA) in the blood. The presence of ANA is associated with several autoimmune diseases. It is seen in almost all patients with lupus.  Your test results will be reported as either positive or negative. Talk with your health care provider about what your results mean.    Anticyclic-Citrullinated Peptide Antibody Test Why am  I having this test? You may have the anticyclic-citrullinated peptide antibody test done to help:  Diagnose rheumatoid arthritis (RA). RA is a long-term (chronic) disease that causes inflammation in the joints.  Determine the severity of your RA, including how much worse it is getting (progression). This test may be done if you have unexplained joint inflammation and have previously tested negative for rheumatoid factor. It may also be done if you have been diagnosed with undifferentiated arthritis and your health care provider suspects rheumatoid arthritis. What is being tested? This test checks your blood for the presence of anticyclic-citrullinated peptide antibodies. Antibodies are cells that are part of the body's disease-fighting (immune) system. These antibodies appear early in the course of RA and are thought to be directly involved in the progression of the disease. What kind of sample is taken? A blood sample is required for this test. It is usually collected by inserting a needle into a blood vessel.   How are the results reported? Your test results will be reported as either positive or negative. A result is considered negative if there is less than 20 units of the antibody per mL of blood. What do the results mean?  A positive blood test may mean that you have RA.  A negative blood test means that it is less likely that you have RA. However, a negative test does not completely rule out rheumatoid arthritis. Talk with your health care provider about what your results mean. Questions to ask your health care provider Ask your health care provider, or the department that is doing the test:  When will my results be ready?  How will I get my results?  What are my treatment options?  What other tests do I need?  What are my next steps? Summary  The anticyclic-citrullinated peptide antibody blood test may be done to help your health care provider diagnose rheumatoid arthritis  (RA).  This test checks your blood for the presence of anticyclic-citrullinated peptide antibodies. These antibodies appear early in the course of RA.  A positive blood test may mean that you have RA.    Erythrocyte Sedimentation Rate Test Why am I having this test? The erythrocyte sedimentation rate (ESR) test is used to help find illnesses related to:  Sudden (acute) or long-term (chronic) infections.  Inflammation.  The body's disease-fighting system attacking healthy cells (autoimmune diseases).  Cancer.  Tissue death. If you have symptoms that may be related to any of these illnesses, your health care provider may do an ESR test before doing more specific tests. If you have an inflammatory immune disease, such as rheumatoid arthritis, you may have this test to help monitor your therapy. What is being tested? This test measures how long it takes for your red blood cells (erythrocytes) to settle in a solution over a certain amount of time (sedimentation rate). When you have an infection or  inflammation, your red blood cells clump together and settle faster. The sedimentation rate provides information about how much inflammation is present in the body. What kind of sample is taken? A blood sample is required for this test. It is usually collected by inserting a needle into a blood vessel.   How do I prepare for this test? Follow any instructions from your health care provider about changing or stopping your regular medicines. Tell a health care provider about:  Any allergies you have.  All medicines you are taking, including vitamins, herbs, eye drops, creams, and over-the-counter medicines.  Any blood disorders you have.  Any surgeries you have had.  Any medical conditions you have, such as thyroid or kidney disease.  Whether you are pregnant or may be pregnant. How are the results reported? Your results will be reported as a value that measures sedimentation rate in  millimeters per hour (mm/hr). Your health care provider will compare your results to normal ranges that were established after testing a large group of people (reference values). Reference values may vary among labs and hospitals. For this test, common reference values, which vary by age and gender, are:  Newborn: 0-2 mm/hr.  Child, up to puberty: 0-10 mm/hr.  Male: ? Under 50 years: 0-20 mm/hr. ? 50-85 years: 0-30 mm/hr. ? Over 85 years: 0-42 mm/hr.  Male: ? Under 50 years: 0-15 mm/hr. ? 50-85 years: 0-20 mm/hr. ? Over 85 years: 0-30 mm/hr. Certain conditions or medicines may cause ESR levels to be falsely lower or higher, such as:  Pregnancy.  Obesity.  Steroids, birth control pills, and blood thinners.  Thyroid or kidney disease. What do the results mean? Results that are within reference values are considered normal, meaning that the level of inflammation in your body is healthy. High ESR levels mean that there is inflammation in your body. You will have more tests to help make a diagnosis. Inflammation may result from many different conditions or injuries. Talk with your health care provider about what your results mean. Questions to ask your health care provider Ask your health care provider, or the department that is doing the test:  When will my results be ready?  How will I get my results?  What are my treatment options?  What other tests do I need?  What are my next steps? Summary  The erythrocyte sedimentation rate (ESR) test is used to help find illnesses associated with sudden (acute) or long-term (chronic) infections, inflammation, autoimmune diseases, cancer, or tissue death.  If you have symptoms that may be related to any of these illnesses, your health care provider may do an ESR test before doing more specific tests. If you have an inflammatory immune disease, such as rheumatoid arthritis, you may have this test to help monitor your therapy.  This  test measures how long it takes for your red blood cells (erythrocytes) to settle in a solution over a certain amount of time (sedimentation rate). This provides information about how much inflammation is present in the body. This information is not intended to replace advice given to you by your health care provider. Make sure you discuss any questions you have with your health care provider. Document Revised: 11/21/2019 Document Reviewed: 11/21/2019 Elsevier Patient Education  [place].

## 2020-06-14 LAB — C3 AND C4
C3 Complement: 141 mg/dL (ref 82–185)
C4 Complement: 27 mg/dL (ref 15–53)

## 2020-06-14 LAB — CYCLIC CITRUL PEPTIDE ANTIBODY, IGG: Cyclic Citrullin Peptide Ab: >250 UNITS — ABNORMAL HIGH

## 2020-06-14 LAB — SJOGRENS SYNDROME-B EXTRACTABLE NUCLEAR ANTIBODY: SSB (La) (ENA) Antibody, IgG: <1 AI

## 2020-06-14 LAB — RNP ANTIBODY: Ribonucleic Protein(ENA) Antibody, IgG: <1 AI

## 2020-06-14 LAB — SJOGRENS SYNDROME-A EXTRACTABLE NUCLEAR ANTIBODY: SSA (Ro) (ENA) Antibody, IgG: <1 AI

## 2020-06-14 LAB — ANTI-DNA ANTIBODY, DOUBLE-STRANDED: ds DNA Ab: 5 IU/mL — ABNORMAL HIGH

## 2020-06-14 LAB — SEDIMENTATION RATE: Sed Rate: 14 mm/h (ref 0–20)

## 2020-06-14 LAB — VITAMIN D 25 HYDROXY (VIT D DEFICIENCY, FRACTURES): Vit D, 25-Hydroxy: 22 ng/mL — ABNORMAL LOW (ref 30–100)

## 2020-06-14 LAB — ANTI-SMITH ANTIBODY: ENA SM Ab Ser-aCnc: <1 AI

## 2020-06-16 ENCOUNTER — Telehealth: Payer: Self-pay

## 2020-06-16 DIAGNOSIS — M79641 Pain in right hand: Secondary | ICD-10-CM

## 2020-06-16 MED ORDER — PREDNISONE 10 MG PO TABS: ORAL_TABLET | ORAL | 0 refills | Status: AC

## 2020-06-16 NOTE — Telephone Encounter (Signed)
Lab results from 06/11/2020 in [facility]- please advise.

## 2020-06-16 NOTE — Telephone Encounter (Signed)
Spoke with patient, advised Prednisone was sent to pharmacy and labs have been forwarded to PCP. Patient will update [place] with how the Prednisone helps.

## 2020-06-16 NOTE — Telephone Encounter (Signed)
Spoke with patient, advised lab test is highly positive for antibodies associated with rheumatoid arthritis. He also has low vitamin D still. His joint inflammation may represent new start of RA rather than being reactive from previous infection. So this may not go away on its own without longer term treatment. He should continue taking vitamin D supplementation, if the high dose capsules hurt his stomach would recommend lower dose daily supplement with 1000 units daily such as OTC supplement doses.  Patient is not currently taking Prednisone- would you like to start him on medication? If so, he uses [facility].

## 2020-06-16 NOTE — Telephone Encounter (Signed)
Patient's wife Jenny Reichmann called requesting a return call to discuss his labwork results.

## 2020-06-16 NOTE — Telephone Encounter (Signed)
Lab test is highly positive for antibodies associated with rheumatoid arthritis. He also has low vitamin D still. His joint inflammation may represent new start of RA rather than being reactive from previous infection. So this may not go away on its own without longer term treatment. If he is having adequate benefit from the prednisone we can follow up in a few weeks to see how he is doing, but if symptoms did not improve much we can see him back sooner whenever available to talk about other options. He should continue taking vitamin D supplementation, if the high dose capsules hurt his stomach would recommend lower dose daily supplement with 1000 units daily such as OTC supplement doses.

## 2020-07-15 ENCOUNTER — Ambulatory Visit (INDEPENDENT_AMBULATORY_CARE_PROVIDER_SITE_OTHER): Payer: Commercial Managed Care - PPO | Admitting: Internal Medicine

## 2020-07-15 ENCOUNTER — Other Ambulatory Visit: Payer: Self-pay

## 2020-07-15 ENCOUNTER — Encounter: Payer: Self-pay | Admitting: Internal Medicine

## 2020-07-15 VITALS — BP 154/60 | HR 84 | Resp 16 | Ht 76.5 in | Wt 220.0 lb

## 2020-07-15 DIAGNOSIS — M059 Rheumatoid arthritis with rheumatoid factor, unspecified: Secondary | ICD-10-CM | POA: Diagnosis not present

## 2020-07-15 DIAGNOSIS — Z79899 Other long term (current) drug therapy: Secondary | ICD-10-CM | POA: Diagnosis not present

## 2020-07-15 MED ORDER — PREDNISONE 10 MG PO TABS: ORAL_TABLET | ORAL | 0 refills | Status: DC

## 2020-07-15 NOTE — Progress Notes (Signed)
Office Visit Note  Patient: Peter Mcmillan             Date of Birth: 1955-11-12           MRN: [id number]             PCP: Lois Huxley, PA Referring: Lois Huxley, [place] Visit Date: 07/15/2020   Subjective:  Follow-up (Bil hand swelling and pain, bil shoulder pain x 1 day)   History of Present Illness: Peter Mcmillan is a 65 y.o. male here for follow up of seropositive (RF-,CCP+) rheumatoid arthritis. He experienced return of inflammatory joint pain in his hands and shoulders since yesterday. He finished the course of oral prednisone about 8 days ago until that time he felt a very good improvement in symptoms. Overall his symptoms have been present pretty much continuously since 2 weeks after he was sick with COVID in December except when taking steroids.    Review of Systems  Constitutional: Negative for fatigue.  HENT: Negative for mouth dryness.   Eyes: Negative for dryness.  Respiratory: Negative for shortness of breath.   Cardiovascular: Negative for swelling in legs/feet.  Gastrointestinal: Negative for constipation.  Endocrine: Positive for increased urination.  Genitourinary: Negative for difficulty urinating.  Musculoskeletal: Positive for arthralgias, joint pain, joint swelling, muscle weakness, morning stiffness and muscle tenderness.  Skin: Negative for rash.  Allergic/Immunologic: Negative for susceptible to infections.  Neurological: Positive for numbness and weakness.  Hematological: Positive for bruising/bleeding tendency.  Psychiatric/Behavioral: Negative for sleep disturbance.    PMFS History:  Patient Active Problem List   Diagnosis Date Noted  . Seropositive rheumatoid arthritis ([place]) 07/15/2020  . High risk medication use 07/15/2020  . Allergic rhinitis due to pollen 06/11/2020  . 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 Essential hypertension 06/11/2020  . Gastro-esophageal reflux disease without esophagitis 06/11/2020  .  Hyperlipidemia 06/11/2020  . 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 Prediabetes 06/11/2020  . Tobacco user 06/11/2020  . Vitamin D deficiency 06/11/2020  . Bilateral hand pain 06/11/2020  . Bilateral shoulder pain 06/11/2020  . Atrial flutter ([place]) 12/09/2013  . PULMONARY NODULE 08/18/2008  . COUGH 08/18/2008    Past Medical History:  Diagnosis Date  . Atrial flutter ([place])   . Erectile dysfunction   . Rheumatoid arthritis ([place])   . Tobacco abuse    Smokeless tobacco    Family History  Problem Relation Age of Onset  . Heart attack Father   . Heart attack Mother   . Dementia Mother   . Healthy Sister   . Healthy Son    Past Surgical History:  Procedure Laterality Date  . BACK SURGERY  1999  . MELANOMA EXCISION  2020   Social History   Social History Narrative  . Not on file   Immunization History  Administered Date(s) Administered  . Hep A / Hep B 02/17/2011  . Influenza Split 01/29/2017  . Moderna Sars-Covid-2 Vaccination 07/09/2019, 07/09/2019, 08/06/2019, 08/06/2019  . Pneumococcal Polysaccharide-23 02/12/2009  . Tdap 02/12/2009, 06/13/2017     Objective: Vital Signs: BP (!) 154/60 (BP Location: Left Arm, Patient Position: Sitting, Cuff Size: Normal)   Pulse 84   Resp 16   Ht 6' 4.5" (1.943 m)   Wt 220 lb (99.8 kg)   BMI 26.43 kg/m    Physical Exam Eyes:     Conjunctiva/sclera: Conjunctivae normal.  Skin:    General: Skin is warm and dry.     Findings: No  rash.  Neurological:     General: No focal deficit present.     Mental Status: He is alert.  Psychiatric:        Mood and Affect: Mood normal.     Musculoskeletal Exam:  Shoulders bilateral pain with abduction positive neer's test no palpable swelling or warmth Elbows full ROM no tenderness or swelling Wrists right side tenderness left side warm and swollen and tender with limited ROM Fingers general edema in palms of both hands, 2nd-3rd right MCPs, all left hand MCPs swollen and  tender, unable to tightly close fists Knees full ROM no tenderness or swelling  CDAI Exam: CDAI Score: 33  Patient Global: 60 mm; Provider Global: 80 mm Swollen: 8 ; Tender: 11  Joint Exam 07/15/2020      Right  Left  Glenohumeral   Tender   Tender  Wrist   Tender  Swollen Tender  MCP 1     Swollen Tender  MCP 2  Swollen Tender  Swollen Tender  MCP 3  Swollen Tender  Swollen Tender  MCP 4     Swollen Tender  MCP 5     Swollen Tender     Investigation: No additional findings.  Imaging: No results found.  Recent Labs: Lab Results  Component Value Date   WBC 6.7 04/03/2020   HGB 15.7 04/03/2020   PLT 281 04/03/2020   NA 138 04/03/2020   K 4.1 04/03/2020   CL 103 04/03/2020   CO2 24 04/03/2020   GLUCOSE 110 (H) 04/03/2020   BUN 14 04/03/2020   CREATININE 0.81 04/03/2020   CALCIUM 8.8 (L) 04/03/2020   GFRAA >60 10/31/2015    Speciality Comments: No specialty comments available.  Procedures:  No procedures performed Allergies: Codeine, Other, Penicillin g, and Penicillins   Assessment / Plan:     Visit Diagnoses: Seropositive rheumatoid arthritis ([place]) - Plan: Sed Rate (ESR), predniSONE (DELTASONE) 10 MG tablet  Inflammation had improved greatly on prednisone but is returned. With high CCP Ab strongly think this reflects new clinical disease of RA not reactive arthritis. Recommend resuming prednisone 20mg  for 1 week then 10mg  daily and plan to start methotrexate 15 mg weekly and folic acid 1 mg daily. Discussed risks for side effects of medication and that DMARD is slow onset so would continue prednisone while starting.  High risk medication use - Plan: CBC with Differential/Platelet, COMPLETE METABOLIC PANEL WITH GFR, Hepatitis panel, acute  Methotrexate requires monitoring for cytopenias and liver toxicity checking CBC and CMP today. Checking baseline hepatitis panel screening. CXR reviewed from 04/2020 showing no airspace disease.  Orders: Orders Placed This  Encounter  Procedures  . CBC with Differential/Platelet  . COMPLETE METABOLIC PANEL WITH GFR  . Hepatitis panel, acute  . Sed Rate (ESR)   Meds ordered this encounter  Medications  . predniSONE (DELTASONE) 10 MG tablet    Sig: Take 2 tablets (20 mg total) by mouth daily with breakfast for 7 days, THEN 1 tablet (10 mg total) daily with breakfast for 21 days.    Dispense:  35 tablet    Refill:  0     Follow-Up Instructions: Return in about 3 weeks (around 08/05/2020) for RA new MTX start 2-3wk f/u.   Collier Salina, MD  Note - This record has been created using [employer].  Chart creation errors have been sought, but may not always  have been located. Such creation errors do not reflect on  the standard of medical care.

## 2020-07-15 NOTE — Patient Instructions (Signed)
Methotrexate tablets What is this medicine? METHOTREXATE (METH oh TREX ate) is a chemotherapy drug used to treat cancer including breast cancer, leukemia, and lymphoma. This medicine can also be used to treat psoriasis and certain kinds of arthritis. This medicine may be used for other purposes; ask your health care provider or pharmacist if you have questions. COMMON BRAND NAME(S): Rheumatrex, Trexall What should I tell my health care provider before I take this medicine? They need to know if you have any of these conditions:  fluid in the stomach area or lungs  if you often drink alcohol  infection or immune system problems  kidney disease or on hemodialysis  liver disease  low blood counts, like low white cell, platelet, or red cell counts  lung disease  radiation therapy  stomach ulcers  ulcerative colitis  an unusual or allergic reaction to methotrexate, other medicines, foods, dyes, or preservatives  pregnant or trying to get pregnant  breast-feeding How should I use this medicine? Take this medicine by mouth with a glass of water. Follow the directions on the prescription label. Take your medicine at regular intervals. Do not take it more often than directed. Do not stop taking except on your doctor's advice. Make sure you know why you are taking this medicine and how often you should take it. If this medicine is used for a condition that is not cancer, like arthritis or psoriasis, it should be taken weekly, NOT daily. Taking this medicine more often than directed can cause serious side effects, even death. Talk to your healthcare provider about safe handling and disposal of this medicine. You may need to take special precautions. Talk to your pediatrician regarding the use of this medicine in children. While this drug may be prescribed for selected conditions, precautions do apply. Overdosage: If you think you have taken too much of this medicine contact a poison  control center or emergency room at once. NOTE: This medicine is only for you. Do not share this medicine with others. What if I miss a dose? If you miss a dose, talk with your doctor or health care professional. Do not take double or extra doses. What may interact with this medicine? Do not take this medicine with any of the following medications:  acitretin This medicine may also interact with the following medication:  aspirin and aspirin-like medicines including salicylates  azathioprine  certain antibiotics like penicillins, tetracycline, and chloramphenicol  certain medicines that treat or prevent blood clots like warfarin, apixaban, dabigatran, and rivaroxaban  certain medicines for stomach problems like esomeprazole, omeprazole, pantoprazole  cyclosporine  dapsone  diuretics  gold  hydroxychloroquine  live virus vaccines  medicines for infection like acyclovir, adefovir, amphotericin B, bacitracin, cidofovir, foscarnet, ganciclovir, gentamicin, pentamidine, vancomycin  mercaptopurine  NSAIDs, medicines for pain and inflammation, like ibuprofen or naproxen  other cytotoxic agents  pamidronate  pemetrexed  penicillamine  phenylbutazone  phenytoin  probenecid  pyrimethamine  retinoids such as isotretinoin and tretinoin  steroid medicines like prednisone or cortisone  sulfonamides like sulfasalazine and trimethoprim/sulfamethoxazole  theophylline  zoledronic acid This list may not describe all possible interactions. Give your health care provider a list of all the medicines, herbs, non-prescription drugs, or dietary supplements you use. Also tell them if you smoke, drink alcohol, or use illegal drugs. Some items may interact with your medicine. What should I watch for while using this medicine? Avoid alcoholic drinks. This medicine can make you more sensitive to the sun. Keep out of the sun.  If you cannot avoid being in the sun, wear protective  clothing and use sunscreen. Do not use sun lamps or tanning beds/booths. You may need blood work done while you are taking this medicine. Call your doctor or health care professional for advice if you get a fever, chills or sore throat, or other symptoms of a cold or flu. Do not treat yourself. This drug decreases your body's ability to fight infections. Try to avoid being around people who are sick. This medicine may increase your risk to bruise or bleed. Call your doctor or health care professional if you notice any unusual bleeding. Be careful brushing or flossing your teeth or using a toothpick because you may get an infection or bleed more easily. If you have any dental work done, tell your dentist you are receiving this medicine. Check with your doctor or health care professional if you get an attack of severe diarrhea, nausea and vomiting, or if you sweat a lot. The loss of too much body fluid can make it dangerous for you to take this medicine. Talk to your doctor about your risk of cancer. You may be more at risk for certain types of cancers if you take this medicine. Do not become pregnant while taking this medicine or for 6 months after stopping it. Women should inform their health care provider if they wish to become pregnant or think they might be pregnant. Men should not father a child while taking this medicine and for 3 months after stopping it. There is potential for serious harm to an unborn child. Talk to your health care provider for more information. Do not breast-feed an infant while taking this medicine or for 1 week after stopping it. This medicine may make it more difficult to get pregnant or father a child. Talk to your health care provider if you are concerned about your fertility. What side effects may I notice from receiving this medicine? Side effects that you should report to your doctor or health care professional as soon as possible:  allergic reactions like skin rash,  itching or hives, swelling of the face, lips, or tongue  breathing problems or shortness of breath  diarrhea  dry, nonproductive cough  low blood counts - this medicine may decrease the number of white blood cells, red blood cells and platelets. You may be at increased risk for infections and bleeding.  mouth sores  redness, blistering, peeling or loosening of the skin, including inside the mouth  signs of infection - fever or chills, cough, sore throat, pain or trouble passing urine  signs and symptoms of bleeding such as bloody or black, tarry stools; red or dark-brown urine; spitting up blood or brown material that looks like coffee grounds; red spots on the skin; unusual bruising or bleeding from the eye, gums, or nose  signs and symptoms of kidney injury like trouble passing urine or change in the amount of urine  signs and symptoms of liver injury like dark yellow or brown urine; general ill feeling or flu-like symptoms; light-colored stools; loss of appetite; nausea; right upper belly pain; unusually weak or tired; yellowing of the eyes or skin Side effects that usually do not require medical attention (report to your doctor or health care professional if they continue or are bothersome):  dizziness  hair loss  tiredness  upset stomach  vomiting This list may not describe all possible side effects. Call your doctor for medical advice about side effects. You may report side effects to  FDA at 1-800-FDA-1088. Where should I keep my medicine? Keep out of the reach of children and pets. Store at room temperature between 20 and 25 degrees C (68 and 77 degrees F). Protect from light. Get rid of any unused medicine after the expiration date. Talk to your health care provider about how to dispose of unused medicine. Special directions may apply.   Prednisone tablets What is this medicine? PREDNISONE (PRED ni sone) is a corticosteroid. It is commonly used to treat inflammation  of the skin, joints, lungs, and other organs. Common conditions treated include asthma, allergies, and arthritis. It is also used for other conditions, such as blood disorders and diseases of the adrenal glands. This medicine may be used for other purposes; ask your health care provider or pharmacist if you have questions. COMMON BRAND NAME(S): Deltasone, Predone, Sterapred, Sterapred DS What should I tell my health care provider before I take this medicine? They need to know if you have any of these conditions:  Cushing's syndrome  diabetes  glaucoma  heart disease  high blood pressure  infection (especially a virus infection such as chickenpox, cold sores, or herpes)  kidney disease  liver disease  mental illness  myasthenia gravis  osteoporosis  seizures  stomach or intestine problems  thyroid disease  an unusual or allergic reaction to lactose, prednisone, other medicines, foods, dyes, or preservatives  pregnant or trying to get pregnant  breast-feeding How should I use this medicine? Take this medicine by mouth with a glass of water. Follow the directions on the prescription label. Take this medicine with food. If you are taking this medicine once a day, take it in the morning. Do not take more medicine than you are told to take. Do not suddenly stop taking your medicine because you may develop a severe reaction. Your doctor will tell you how much medicine to take. If your doctor wants you to stop the medicine, the dose may be slowly lowered over time to avoid any side effects. Talk to your pediatrician regarding the use of this medicine in children. Special care may be needed. Overdosage: If you think you have taken too much of this medicine contact a poison control center or emergency room at once. NOTE: This medicine is only for you. Do not share this medicine with others. What if I miss a dose? If you miss a dose, take it as soon as you can. If it is almost time  for your next dose, talk to your doctor or health care professional. You may need to miss a dose or take an extra dose. Do not take double or extra doses without advice. What may interact with this medicine? Do not take this medicine with any of the following medications:  metyrapone  mifepristone This medicine may also interact with the following medications:  aminoglutethimide  amphotericin B  aspirin and aspirin-like medicines  barbiturates  certain medicines for diabetes, like glipizide or glyburide  cholestyramine  cholinesterase inhibitors  cyclosporine  digoxin  diuretics  ephedrine  male hormones, like estrogens and birth control pills  isoniazid  ketoconazole  NSAIDS, medicines for pain and inflammation, like ibuprofen or naproxen  phenytoin  rifampin  toxoids  vaccines  warfarin This list may not describe all possible interactions. Give your health care provider a list of all the medicines, herbs, non-prescription drugs, or dietary supplements you use. Also tell them if you smoke, drink alcohol, or use illegal drugs. Some items may interact with your medicine. What should  I watch for while using this medicine? Visit your doctor or health care professional for regular checks on your progress. If you are taking this medicine over a prolonged period, carry an identification card with your name and address, the type and dose of your medicine, and your doctor's name and address. This medicine may increase your risk of getting an infection. Tell your doctor or health care professional if you are around anyone with measles or chickenpox, or if you develop sores or blisters that do not heal properly. If you are going to have surgery, tell your doctor or health care professional that you have taken this medicine within the last twelve months. Ask your doctor or health care professional about your diet. You may need to lower the amount of salt you eat. This  medicine may increase blood sugar. Ask your healthcare provider if changes in diet or medicines are needed if you have diabetes. What side effects may I notice from receiving this medicine? Side effects that you should report to your doctor or health care professional as soon as possible:  allergic reactions like skin rash, itching or hives, swelling of the face, lips, or tongue  changes in emotions or moods  changes in vision  depressed mood  eye pain  fever or chills, cough, sore throat, pain or difficulty passing urine  signs and symptoms of high blood sugar such as being more thirsty or hungry or having to urinate more than normal. You may also feel very tired or have blurry vision.  swelling of ankles, feet Side effects that usually do not require medical attention (report to your doctor or health care professional if they continue or are bothersome):  confusion, excitement, restlessness  headache  nausea, vomiting  skin problems, acne, thin and shiny skin  trouble sleeping  weight gain This list may not describe all possible side effects. Call your doctor for medical advice about side effects. You may report side effects to FDA at 1-800-FDA-1088. Where should I keep my medicine? Keep out of the reach of children. Store at room temperature between 15 and 30 degrees C (59 and 86 degrees F). Protect from light. Keep container tightly closed. Throw away any unused medicine after the expiration date.

## 2020-07-16 LAB — CBC WITH DIFFERENTIAL/PLATELET
Absolute Monocytes: 869 cells/uL (ref 200–950)
Basophils Absolute: 41 cells/uL (ref 0–200)
Basophils Relative: 0.5 %
Eosinophils Absolute: 131 cells/uL (ref 15–500)
Eosinophils Relative: 1.6 %
HCT: 47 % (ref 38.5–50.0)
Hemoglobin: 15.7 g/dL (ref 13.2–17.1)
Lymphs Abs: 984 cells/uL (ref 850–3900)
MCH: 29.2 pg (ref 27.0–33.0)
MCHC: 33.4 g/dL (ref 32.0–36.0)
MCV: 87.5 fL (ref 80.0–100.0)
MPV: 10.2 fL (ref 7.5–12.5)
Monocytes Relative: 10.6 %
Neutro Abs: 6175 cells/uL (ref 1500–7800)
Neutrophils Relative %: 75.3 %
Platelets: 288 10*3/uL (ref 140–400)
RBC: 5.37 10*6/uL (ref 4.20–5.80)
RDW: 13.3 % (ref 11.0–15.0)
Total Lymphocyte: 12 %
WBC: 8.2 10*3/uL (ref 3.8–10.8)

## 2020-07-16 LAB — COMPLETE METABOLIC PANEL WITH GFR
AG Ratio: 1.5 (calc) (ref 1.0–2.5)
ALT: 21 U/L (ref 9–46)
AST: 15 U/L (ref 10–35)
Albumin: 3.8 g/dL (ref 3.6–5.1)
Alkaline phosphatase (APISO): 50 U/L (ref 35–144)
BUN: 14 mg/dL (ref 7–25)
CO2: 23 mmol/L (ref 20–32)
Calcium: 8.8 mg/dL (ref 8.6–10.3)
Chloride: 104 mmol/L (ref 98–110)
Creat: 0.83 mg/dL (ref 0.70–1.25)
GFR, Est African American: 108 mL/min/{1.73_m2} (ref >=60)
GFR, Est Non African American: 93 mL/min/{1.73_m2} (ref >=60)
Globulin: 2.6 g/dL (calc) (ref 1.9–3.7)
Glucose, Bld: 99 mg/dL (ref 65–99)
Potassium: 4.2 mmol/L (ref 3.5–5.3)
Sodium: 137 mmol/L (ref 135–146)
Total Bilirubin: 0.8 mg/dL (ref 0.2–1.2)
Total Protein: 6.4 g/dL (ref 6.1–8.1)

## 2020-07-16 LAB — SEDIMENTATION RATE: Sed Rate: 28 mm/h — ABNORMAL HIGH (ref 0–20)

## 2020-07-16 LAB — HEPATITIS PANEL, ACUTE
Hep A IgM: NONREACTIVE
Hep B C IgM: NONREACTIVE
Hepatitis B Surface Ag: NONREACTIVE
Hepatitis C Ab: NONREACTIVE
SIGNAL TO CUT-OFF: 0.01 (ref <1.00)

## 2020-07-20 ENCOUNTER — Telehealth: Payer: Self-pay

## 2020-07-20 MED ORDER — FOLIC ACID 1 MG PO TABS: 1.0000 mg | ORAL_TABLET | Freq: Every day | ORAL | 0 refills | Status: DC

## 2020-07-20 MED ORDER — METHOTREXATE 2.5 MG PO TABS: 15.0000 mg | ORAL_TABLET | ORAL | 0 refills | Status: DC

## 2020-07-20 NOTE — Telephone Encounter (Signed)
I spoke to Peter Mcmillan labs are okay to begin methotrexate he is still having swelling in the right hand. Rx sent to pharmacy for 15 mg PO weekly and folic acid 1mg  daily. He will continue the prednisone in the meantime while starting this.

## 2020-07-20 NOTE — Telephone Encounter (Signed)
Patient requested a return call to discuss his labwork results.  Patient states the swelling is mostly in his right hand now, but he is still having difficulty bending his fingers.

## 2020-07-20 NOTE — Addendum Note (Signed)
Addended by: Collier Salina on: 07/20/2020 11:28 AM   Modules accepted: Orders

## 2020-07-20 NOTE — Telephone Encounter (Signed)
Please advise 

## 2020-07-31 ENCOUNTER — Emergency Department (HOSPITAL_COMMUNITY)
Admission: EM | Admit: 2020-07-31 | Discharge: 2020-07-31 | Disposition: A | Discharge status: Home / Self Care | Payer: Commercial Managed Care - PPO | Attending: Emergency Medicine | Admitting: Emergency Medicine

## 2020-07-31 ENCOUNTER — Telehealth: Payer: Self-pay | Admitting: Rheumatology

## 2020-07-31 ENCOUNTER — Emergency Department (HOSPITAL_COMMUNITY): Payer: Commercial Managed Care - PPO

## 2020-07-31 ENCOUNTER — Encounter (HOSPITAL_COMMUNITY): Payer: Self-pay | Admitting: Emergency Medicine

## 2020-07-31 ENCOUNTER — Other Ambulatory Visit: Payer: Self-pay

## 2020-07-31 DIAGNOSIS — Z85820 Personal history of malignant melanoma of skin: Secondary | ICD-10-CM | POA: Diagnosis not present

## 2020-07-31 DIAGNOSIS — Z20822 Contact with and (suspected) exposure to covid-19: Secondary | ICD-10-CM | POA: Insufficient documentation

## 2020-07-31 DIAGNOSIS — F17220 Nicotine dependence, chewing tobacco, uncomplicated: Secondary | ICD-10-CM | POA: Diagnosis not present

## 2020-07-31 DIAGNOSIS — M79641 Pain in right hand: Secondary | ICD-10-CM | POA: Diagnosis present

## 2020-07-31 DIAGNOSIS — R42 Dizziness and giddiness: Secondary | ICD-10-CM | POA: Diagnosis not present

## 2020-07-31 DIAGNOSIS — M069 Rheumatoid arthritis, unspecified: Secondary | ICD-10-CM | POA: Diagnosis not present

## 2020-07-31 DIAGNOSIS — I10 Essential (primary) hypertension: Secondary | ICD-10-CM | POA: Diagnosis not present

## 2020-07-31 LAB — CBC
HCT: 46.8 % (ref 39.0–52.0)
Hemoglobin: 15.5 g/dL (ref 13.0–17.0)
MCH: 29.9 pg (ref 26.0–34.0)
MCHC: 33.1 g/dL (ref 30.0–36.0)
MCV: 90.3 fL (ref 80.0–100.0)
Platelets: 324 10*3/uL (ref 150–400)
RBC: 5.18 MIL/uL (ref 4.22–5.81)
RDW: 12.7 % (ref 11.5–15.5)
WBC: 8 10*3/uL (ref 4.0–10.5)
nRBC: 0 % (ref 0.0–0.2)

## 2020-07-31 LAB — BASIC METABOLIC PANEL
Anion gap: 11 (ref 5–15)
BUN: 9 mg/dL (ref 8–23)
CO2: 24 mmol/L (ref 22–32)
Calcium: 9.1 mg/dL (ref 8.9–10.3)
Chloride: 103 mmol/L (ref 98–111)
Creatinine, Ser: 0.87 mg/dL (ref 0.61–1.24)
GFR, Estimated: >60 mL/min (ref >60)
Glucose, Bld: 163 mg/dL — ABNORMAL HIGH (ref 70–99)
Potassium: 3.4 mmol/L — ABNORMAL LOW (ref 3.5–5.1)
Sodium: 138 mmol/L (ref 135–145)

## 2020-07-31 LAB — URINALYSIS, ROUTINE W REFLEX MICROSCOPIC
Bilirubin Urine: NEGATIVE
Glucose, UA: NEGATIVE mg/dL
Hgb urine dipstick: NEGATIVE
Ketones, ur: NEGATIVE mg/dL
Leukocytes,Ua: NEGATIVE
Nitrite: NEGATIVE
Protein, ur: NEGATIVE mg/dL
Specific Gravity, Urine: 1.017 (ref 1.005–1.030)
pH: 6 (ref 5.0–8.0)

## 2020-07-31 LAB — SEDIMENTATION RATE: Sed Rate: 28 mm/hr — ABNORMAL HIGH (ref 0–16)

## 2020-07-31 LAB — RESP PANEL BY RT-PCR (FLU A&B, COVID) ARPGX2
Influenza A by PCR: NEGATIVE
Influenza B by PCR: NEGATIVE
SARS Coronavirus 2 by RT PCR: NEGATIVE

## 2020-07-31 LAB — C-REACTIVE PROTEIN: CRP: 3.7 mg/dL — ABNORMAL HIGH (ref <1.0)

## 2020-07-31 LAB — MAGNESIUM: Magnesium: 2 mg/dL (ref 1.7–2.4)

## 2020-07-31 MED ORDER — PREDNISONE 10 MG PO TABS
30.0000 mg | ORAL_TABLET | Freq: Every day | ORAL | 0 refills | Status: DC
Start: 2020-07-31 — End: 2020-07-31

## 2020-07-31 MED ORDER — SODIUM CHLORIDE 0.9% FLUSH: 3.0000 mL | Freq: Once | INTRAVENOUS | Status: DC

## 2020-07-31 MED ORDER — SODIUM CHLORIDE 0.9 % IV BOLUS
1000.0000 mL | Freq: Once | INTRAVENOUS | Status: AC
  Administered 2020-07-31: 1000 mL via INTRAVENOUS

## 2020-07-31 MED ORDER — PREDNISONE 10 MG PO TABS: 30.0000 mg | ORAL_TABLET | Freq: Every day | ORAL | 0 refills | Status: DC

## 2020-07-31 MED ORDER — PREDNISONE 20 MG PO TABS
30.0000 mg | ORAL_TABLET | Freq: Once | ORAL | Status: AC
  Administered 2020-07-31: 30 mg via ORAL
  Filled 2020-07-31: qty 2

## 2020-07-31 NOTE — Telephone Encounter (Signed)
I received a phone call from Dr. Harriette Bouillon, ER at 2:48 PM on July 31, 2020.  Dr. Melina Copa stated that the patient presented to the emergency room with pain and swelling in multiple joints.  Patient was recently started on methotrexate by Dr. Benjamine Mola.  Patient is on prednisone taper and is currently on prednisone 10 mg p.o. daily.  I advised Dr. Melina Copa to to increase dose of prednisone to 20 mg p.o. daily.  I also advised that the patient should call our office on Monday to schedule an appointment with Dr. Benjamine Mola. Bo Merino, MD

## 2020-07-31 NOTE — ED Provider Notes (Signed)
[facility] EMERGENCY DEPARTMENT Provider Note   CSN: [id number] Arrival date & time: 07/31/20  1208     History Chief Complaint  Patient presents with  . Generalized Body Aches  . Dizziness    Peter Mcmillan is a 65 y.o. male.  He is here with a complaint of pain in his hands and shoulders and difficulty with any range of motion.  This has been an ongoing problem for months and recently started on methotrexate and is tapering off of prednisone.  He is also had a dry cough and feels generally weak.  No sick contacts or recent travel.  The history is provided by the patient.  Dizziness Quality:  Lightheadedness Onset quality:  Gradual Timing:  Intermittent Progression:  Unchanged Chronicity:  New Relieved by:  Nothing Worsened by:  Movement Ineffective treatments:  None tried Associated symptoms: no blood in stool, no chest pain, no diarrhea, no headaches, no nausea, no shortness of breath, no vision changes and no vomiting        Past Medical History:  Diagnosis Date  . Atrial flutter ([place])   . Erectile dysfunction   . Rheumatoid arthritis ([place])   . Tobacco abuse    Smokeless tobacco    Patient Active Problem List   Diagnosis Date Noted  . Seropositive rheumatoid arthritis ([place]) 07/15/2020  . High risk medication use 07/15/2020  . Allergic rhinitis due to pollen 06/11/2020  . 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 Essential hypertension 06/11/2020  . Gastro-esophageal reflux disease without esophagitis 06/11/2020  . Hyperlipidemia 06/11/2020  . 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 Prediabetes 06/11/2020  . Tobacco user 06/11/2020  . Vitamin D deficiency 06/11/2020  . Bilateral hand pain 06/11/2020  . Bilateral shoulder pain 06/11/2020  . Atrial flutter ([place]) 12/09/2013  . PULMONARY NODULE 08/18/2008  . COUGH 08/18/2008    Past Surgical History:  Procedure Laterality Date  . BACK SURGERY  1999  . MELANOMA  EXCISION  2020       Family History  Problem Relation Age of Onset  . Heart attack Father   . Heart attack Mother   . Dementia Mother   . Healthy Sister   . Healthy Son     Social History   Tobacco Use  . Smoking status: Never Smoker  . Smokeless tobacco: Current User    Types: Snuff  Vaping Use  . Vaping Use: Never used  Substance Use Topics  . Alcohol use: Yes    Comment: Rare  . Drug use: No    Home Medications Prior to Admission medications   Medication Sig Start Date End Date Taking? Authorizing Provider  cholecalciferol (VITAMIN D3) 25 MCG (1000 UNIT) tablet Take 1,000 Units by mouth daily.    [provider]  diltiazem (CARDIZEM CD) 120 MG 24 hr capsule Take 1 capsule (120 mg total) by mouth 2 (two) times daily. Pt overdue for an appt. Must call and schedule for further refills 1st attempt 12/13/16   Evans Lance, MD  folic acid (FOLVITE) 1 MG tablet Take 1 tablet (1 mg total) by mouth daily. 07/20/20   Rice, Resa Miner, MD  loratadine (CLARITIN) 10 MG tablet as needed.    [provider]  methotrexate (RHEUMATREX) 2.5 MG tablet Take 6 tablets (15 mg total) by mouth once a week. Caution:Chemotherapy. Protect from light. 07/20/20 08/24/20  Collier Salina, MD  omeprazole (PRILOSEC) 40 MG capsule as needed. 12/17/19   [provider]  predniSONE (DELTASONE) 10 MG tablet Take 2 tablets (20 mg total) by mouth daily with breakfast for 7 days, THEN 1 tablet (10 mg total) daily with breakfast for 21 days. 07/15/20 08/12/20  Collier Salina, MD    Allergies    Codeine, Other, Penicillin g, and Penicillins  Review of Systems   Review of Systems  Constitutional: Negative for fever.  HENT: Negative for sore throat.   Eyes: Negative for pain.  Respiratory: Negative for shortness of breath.   Cardiovascular: Negative for chest pain.  Gastrointestinal: Negative for abdominal pain, blood in stool, diarrhea, nausea and vomiting.   Genitourinary: Negative for dysuria.  Musculoskeletal: Positive for arthralgias.  Skin: Negative for rash.  Neurological: Positive for dizziness. Negative for headaches.    Physical Exam Updated Vital Signs BP (!) 143/82 (BP Location: Right Arm)   Pulse 87   Temp 98.4 F (36.9 C) (Oral)   Resp 16   SpO2 100%   Physical Exam Vitals and nursing note reviewed.  Constitutional:      Appearance: Normal appearance. He is well-developed.  HENT:     Head: Normocephalic and atraumatic.  Eyes:     Conjunctiva/sclera: Conjunctivae normal.  Cardiovascular:     Rate and Rhythm: Normal rate and regular rhythm.     Heart sounds: No murmur heard.   Pulmonary:     Effort: Pulmonary effort is normal. No respiratory distress.     Breath sounds: Normal breath sounds.  Abdominal:     Palpations: Abdomen is soft.     Tenderness: There is no abdominal tenderness.  Musculoskeletal:        General: Swelling and tenderness present. No deformity or signs of injury.     Cervical back: Neck supple.     Comments: Patient has diffuse pain over shoulders elbows wrists and hands.  There is also some tenderness of his bilateral knees.  He has some swelling over the dorsum of both hands.  None of the joints are particularly warm or red.  Skin:    General: Skin is warm and dry.  Neurological:     Mental Status: He is alert.     ED Results / Procedures / Treatments   Labs (all labs ordered are listed, but only abnormal results are displayed) Labs Reviewed  BASIC METABOLIC PANEL - Abnormal; Notable for the following components:      Result Value   Potassium 3.4 (*)    Glucose, Bld 163 (*)    All other components within normal limits  C-REACTIVE PROTEIN - Abnormal; Notable for the following components:   CRP 3.7 (*)    All other components within normal limits  RESP PANEL BY RT-PCR (FLU A&B, COVID) ARPGX2  CBC  URINALYSIS, ROUTINE W REFLEX MICROSCOPIC  MAGNESIUM  SEDIMENTATION RATE     EKG EKG Interpretation  Date/Time:  Saturday July 31 2020 12:19:03 EDT Ventricular Rate:  94 PR Interval:  140 QRS Duration: 80 QT Interval:  352 QTC Calculation: 440 R Axis:   29 Text Interpretation: Sinus rhythm with Premature atrial complexes Minimal voltage criteria for LVH, may be normal variant ( R in aVL ) Borderline ECG No significant change since prior 1/22 Confirmed by Aletta Edouard [phone]) on 07/31/2020 1:38:51 PM   Radiology DG Chest Port 1 View  Result Date: 07/31/2020 CLINICAL DATA:  Generalized body aches. EXAM: PORTABLE CHEST 1 VIEW COMPARISON:  04/03/2020 FINDINGS: Single view of the chest demonstrates clear lungs. Old bilateral rib fractures. Heart size is  within normal limits and stable. Trachea is midline. IMPRESSION: No active disease. Electronically Signed   By: Markus Daft M.D.   On: 07/31/2020 14:15    Procedures Procedures   Medications Ordered in ED Medications  sodium chloride flush (NS) 0.9 % injection 3 mL (has no administration in time range)  sodium chloride 0.9 % bolus 1,000 mL (0 mLs Intravenous Stopped 07/31/20 1713)  predniSONE (DELTASONE) tablet 30 mg (30 mg Oral Given 07/31/20 1602)    ED Course  I have reviewed the triage vital signs and the nursing notes.  Pertinent labs & imaging results that were available during my care of the patient were reviewed by me and considered in my medical decision making (see chart for details).  Clinical Course as of 07/31/20 1729  Sat Jul 31, 2020  1417 Placed a call in to rheumatology on-call.  As best I can tell he is currently on 10 mg of prednisone as a taper [MB]  1452 Discussed with Dr. Garen Grams rheumatology.  She felt that it would be easiest to increase his prednisone again until he can see Dr. Benjamine Mola.  She recommended probably 20 or 30 mg daily. [MB]    Clinical Course User Index [MB] Hayden Rasmussen, MD   MDM Rules/Calculators/A&P                         This patient complains of  diffuse arthralgias most significantly in his shoulders bilaterally and hands bilaterally; this involves an extensive number of treatment Options and is a complaint that carries with it a high risk of complications and Morbidity. The differential includes arthritis flare, septic joint, metabolic derangement, infection  I ordered, reviewed and interpreted labs, which included CBC with normal white count normal hemoglobin, chemistries normal other than mildly low potassium and elevated glucose, urinalysis negative, COVID testing negative, CRP mildly elevated I ordered medication IV fluids and oral prednisone I ordered imaging studies which included chest x-ray and I independently    visualized and interpreted imaging which showed no acute findings Additional history obtained from patient's wife Previous records obtained and reviewed in epic including prior rheumatology visit few weeks ago with Dr. Benjamine Mola I consulted Dr. Garen Grams rheumatology and discussed lab and imaging findings  Critical Interventions: None  After the interventions stated above, I reevaluated the patient and found patient still to be symptomatic although not particularly worse.  He is signed out to oncoming provider to follow-up on patient's response to oral prednisone and IV fluids.  Anticipate can be discharged safely to home and outpatient follow-up with rheumatology.   Final Clinical Impression(s) / ED Diagnoses Final diagnoses:  Rheumatoid arthritis flare ([place])    Rx / DC Orders ED Discharge Orders         Ordered    predniSONE (DELTASONE) 10 MG tablet  Daily        07/31/20 1728    predniSONE (DELTASONE) 10 MG tablet  Daily,   Status:  Discontinued        07/31/20 1504           Hayden Rasmussen, MD 07/31/20 1733

## 2020-07-31 NOTE — ED Triage Notes (Signed)
Pt reports body aches since last night and bilateral hand swelling.  States when he closes his eyes he sees spots and feels lightheaded.

## 2020-07-31 NOTE — Discharge Instructions (Addendum)
You are increasing your prednisone back to 30 mg a day until you follow-up with your rheumatologist.  Please call their office if you are having any difficulties with this treatment.

## 2020-07-31 NOTE — ED Provider Notes (Signed)
Emergency Medicine Provider Triage Evaluation Note  Peter Mcmillan, a 65 y.o. male evaluated in triage.  Pt complains of body aches, dry cough, generalized weakness, worsening RA pain. Vaccinated x2 against covid  BP (!) 143/82 (BP Location: Right Arm)   Pulse 87   Temp 98.4 F (36.9 C) (Oral)   Resp 16   SpO2 100%   Patient is alert, no acute distress, normal work of breathing    Medically screening exam initiated at 1:31 PM. Appropriate orders placed.  Helder Crisafulli was informed that the remainder of the evaluation will be completed by another provider, this initial triage assessment does not replace that evaluation, and the importance of remaining in the ED until their evaluation is complete.       Tariya Morrissette, [place] N, PA-C 07/31/20 1331    Lajean Saver, MD 07/31/20 1407

## 2020-08-02 ENCOUNTER — Telehealth: Payer: Self-pay

## 2020-08-02 NOTE — Telephone Encounter (Signed)
Patient called stating he had increased pain and swelling in his hands, arms, and knees and went to the emergency room on Saturday, 07/31/20.  Patient states the doctor increased his Prednisone to 10 mg 3 times/day for total of 30 mg.  Patient states the increased Prednisone has helped his swelling, but he is still experiencing pain in his hands.   Patient requested a return call.

## 2020-08-02 NOTE — Telephone Encounter (Signed)
We spoke and will be seeing him back for follow up, thanks.

## 2020-08-02 NOTE — Telephone Encounter (Signed)
FYI- I spoke with Peter Mcmillan he had an episode of greatly increased joint pain and swelling so went to the ED on Saturday no secondary causes identified he was directed to increase prednisone his swelling feels improved but still having joint pain. I recommended he just stay on the higher dose of steroids for now until we follow up next week I recommended against use of NSAIDs concurrently. Hopefully with methotrexate in his system we can tolerate prednisone tapering, at a slower rate next time.

## 2020-08-09 NOTE — Progress Notes (Signed)
Office Visit Note  Patient: Peter Mcmillan             Date of Birth: July 08, 1955           MRN: [id number]             PCP: Lois Huxley, PA Referring: Lois Huxley, [place] Visit Date: 08/10/2020   Subjective:  Follow-up (Patient is currently taking MTX and Prednisone 30 mg daily. Patient complains of continued hand pain, swelling, and stiffness. )   History of Present Illness: Peter Mcmillan is a 65 y.o. male here for follow up for seropositive rheumatoid arthritis after recently starting methotrexate treatment and treating with prednisone due to symptom flaring and severity.  Since increasing prednisone to 30 mg daily at the ED visit he has improvement in his joint pain swelling and stiffness although it is not completely controlled.  He denies any trouble noticed taking the methotrexate.    Review of Systems  Constitutional: Negative for fatigue.  HENT: Negative for mouth sores, mouth dryness and nose dryness.   Eyes: Negative for pain, itching, visual disturbance and dryness.  Respiratory: Negative for cough, hemoptysis, shortness of breath and difficulty breathing.   Cardiovascular: Negative for chest pain, palpitations and swelling in legs/feet.  Gastrointestinal: Negative for abdominal pain, blood in stool, constipation and diarrhea.  Endocrine: Negative for increased urination.  Genitourinary: Negative for painful urination.  Musculoskeletal: Positive for arthralgias, joint pain, joint swelling and morning stiffness. Negative for myalgias, muscle weakness, muscle tenderness and myalgias.  Skin: Negative for color change, rash and redness.  Allergic/Immunologic: Negative for susceptible to infections.  Neurological: Negative for dizziness, numbness, headaches, memory loss and weakness.  Hematological: Negative for swollen glands.  Psychiatric/Behavioral: Negative for confusion and sleep disturbance.    PMFS History:  Patient Active Problem List   Diagnosis Date Noted   . Seropositive rheumatoid arthritis ([place]) 07/15/2020  . High risk medication use 07/15/2020  . Allergic rhinitis due to pollen 06/11/2020  . 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 Essential hypertension 06/11/2020  . Gastro-esophageal reflux disease without esophagitis 06/11/2020  . Hyperlipidemia 06/11/2020  . 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 Prediabetes 06/11/2020  . Tobacco user 06/11/2020  . Vitamin D deficiency 06/11/2020  . Bilateral hand pain 06/11/2020  . Bilateral shoulder pain 06/11/2020  . Atrial flutter ([place]) 12/09/2013  . PULMONARY NODULE 08/18/2008  . COUGH 08/18/2008    Past Medical History:  Diagnosis Date  . Atrial flutter ([place])   . Erectile dysfunction   . Rheumatoid arthritis ([place])   . Tobacco abuse    Smokeless tobacco    Family History  Problem Relation Age of Onset  . Heart attack Father   . Heart attack Mother   . Dementia Mother   . Healthy Sister   . Healthy Son    Past Surgical History:  Procedure Laterality Date  . BACK SURGERY  1999  . MELANOMA EXCISION  2020   Social History   Social History Narrative  . Not on file   Immunization History  Administered Date(s) Administered  . Hep A / Hep B 02/17/2011  . Influenza Split 01/29/2017  . Moderna Sars-Covid-2 Vaccination 07/09/2019, 07/09/2019, 08/06/2019, 08/06/2019  . Pneumococcal Polysaccharide-23 02/12/2009  . Tdap 02/12/2009, 06/13/2017     Objective: Vital Signs: BP (!) 141/91 (BP Location: Left Arm, Patient Position: Sitting, Cuff Size: Normal)   Pulse 81   Ht 6' 4.5" (1.943 m)   Wt 222 lb  9.6 oz (101 kg)   BMI 26.74 kg/m    Physical Exam HENT:     Mouth/Throat:     Mouth: Mucous membranes are moist.     Pharynx: Oropharynx is clear.  Skin:    General: Skin is warm and dry.     Findings: No rash.  Neurological:     Mental Status: He is alert.  Psychiatric:        Mood and Affect: Mood normal.      CDAI Exam: CDAI Score: 14   Patient Global: 40 mm; Provider Global: 20 mm Swollen: 0 ; Tender: 8  Joint Exam 08/10/2020      Right  Left  Glenohumeral   Tender   Tender  Wrist   Tender   Tender  MCP 2      Tender  MCP 3      Tender  MCP 4      Tender  MCP 5      Tender     Investigation: No additional findings.  Imaging: DG Chest Port 1 View  Result Date: 07/31/2020 CLINICAL DATA:  Generalized body aches. EXAM: PORTABLE CHEST 1 VIEW COMPARISON:  04/03/2020 FINDINGS: Single view of the chest demonstrates clear lungs. Old bilateral rib fractures. Heart size is within normal limits and stable. Trachea is midline. IMPRESSION: No active disease. Electronically Signed   By: Markus Daft M.D.   On: 07/31/2020 14:15    Recent Labs: Lab Results  Component Value Date   WBC 9.1 08/10/2020   HGB 16.0 08/10/2020   PLT 354 08/10/2020   NA 138 08/10/2020   K 4.4 08/10/2020   CL 101 08/10/2020   CO2 28 08/10/2020   GLUCOSE 114 (H) 08/10/2020   BUN 15 08/10/2020   CREATININE 0.83 08/10/2020   BILITOT 0.5 08/10/2020   AST 15 08/10/2020   ALT 22 08/10/2020   PROT 6.7 08/10/2020   CALCIUM 9.4 08/10/2020   GFRAA 108 08/10/2020    Speciality Comments: No specialty comments available.  Procedures:  No procedures performed Allergies: Codeine, Other, Penicillin g, and Penicillins   Assessment / Plan:     Visit Diagnoses: Seropositive rheumatoid arthritis ([place]) - Plan: Sedimentation rate, methotrexate (RHEUMATREX) 2.5 MG tablet, DISCONTINUED: predniSONE (DELTASONE) 10 MG tablet  Arthritis apparently with a recent flareup that has improved with increasing the prednisone dose considerably.  This may represent too quickly discontinuation of the previous steroids after only recently starting methotrexate.  His exam today does not show much active synovitis although there are still numerous peripheral tender joints.  Continue methotrexate 15 mg p.o. weekly we discussed recommending some steroid tapering down to 20 mg at  this time and then decrease to 10 mg if tolerating.  High risk medication use - Plan: CBC with Differential/Platelet, COMPLETE METABOLIC PANEL WITH GFR  Methotrexate toxicity monitoring for cytopenia and hepatotoxicity checking CBC and CMP today.  Orders: Orders Placed This Encounter  Procedures  . CBC with Differential/Platelet  . COMPLETE METABOLIC PANEL WITH GFR  . Sedimentation rate   Meds ordered this encounter  Medications  . methotrexate (RHEUMATREX) 2.5 MG tablet    Sig: Take 6 tablets (15 mg total) by mouth once a week. Caution:Chemotherapy. Protect from light.    Dispense:  30 tablet    Refill:  0  . DISCONTD: predniSONE (DELTASONE) 10 MG tablet    Sig: Take 2 tablets (20 mg total) by mouth daily.    Dispense:  60 tablet    Refill:  0  Follow-Up Instructions: Return in about 6 weeks (around 09/21/2020) for RA f/u MTX @2 -46mos.   Collier Salina, MD  Note - This record has been created using [employer].  Chart creation errors have been sought, but may not always  have been located. Such creation errors do not reflect on  the standard of medical care.

## 2020-08-10 ENCOUNTER — Ambulatory Visit (INDEPENDENT_AMBULATORY_CARE_PROVIDER_SITE_OTHER): Payer: Commercial Managed Care - PPO | Admitting: Internal Medicine

## 2020-08-10 ENCOUNTER — Encounter: Payer: Self-pay | Admitting: Internal Medicine

## 2020-08-10 ENCOUNTER — Other Ambulatory Visit: Payer: Self-pay

## 2020-08-10 VITALS — BP 141/91 | HR 81 | Ht 76.5 in | Wt 222.6 lb

## 2020-08-10 DIAGNOSIS — M059 Rheumatoid arthritis with rheumatoid factor, unspecified: Secondary | ICD-10-CM | POA: Diagnosis not present

## 2020-08-10 DIAGNOSIS — Z79899 Other long term (current) drug therapy: Secondary | ICD-10-CM

## 2020-08-11 ENCOUNTER — Other Ambulatory Visit: Payer: Self-pay | Admitting: Radiology

## 2020-08-11 DIAGNOSIS — M059 Rheumatoid arthritis with rheumatoid factor, unspecified: Secondary | ICD-10-CM

## 2020-08-11 LAB — COMPLETE METABOLIC PANEL WITH GFR
AG Ratio: 1.5 (calc) (ref 1.0–2.5)
ALT: 22 U/L (ref 9–46)
AST: 15 U/L (ref 10–35)
Albumin: 4 g/dL (ref 3.6–5.1)
Alkaline phosphatase (APISO): 48 U/L (ref 35–144)
BUN: 15 mg/dL (ref 7–25)
CO2: 28 mmol/L (ref 20–32)
Calcium: 9.4 mg/dL (ref 8.6–10.3)
Chloride: 101 mmol/L (ref 98–110)
Creat: 0.83 mg/dL (ref 0.70–1.25)
GFR, Est African American: 108 mL/min/{1.73_m2} (ref >=60)
GFR, Est Non African American: 93 mL/min/{1.73_m2} (ref >=60)
Globulin: 2.7 g/dL (calc) (ref 1.9–3.7)
Glucose, Bld: 114 mg/dL — ABNORMAL HIGH (ref 65–99)
Potassium: 4.4 mmol/L (ref 3.5–5.3)
Sodium: 138 mmol/L (ref 135–146)
Total Bilirubin: 0.5 mg/dL (ref 0.2–1.2)
Total Protein: 6.7 g/dL (ref 6.1–8.1)

## 2020-08-11 LAB — CBC WITH DIFFERENTIAL/PLATELET
Absolute Monocytes: 191 cells/uL — ABNORMAL LOW (ref 200–950)
Basophils Absolute: 82 cells/uL (ref 0–200)
Basophils Relative: 0.9 %
Eosinophils Absolute: 9 cells/uL — ABNORMAL LOW (ref 15–500)
Eosinophils Relative: 0.1 %
HCT: 48.1 % (ref 38.5–50.0)
Hemoglobin: 16 g/dL (ref 13.2–17.1)
Lymphs Abs: 865 cells/uL (ref 850–3900)
MCH: 29.8 pg (ref 27.0–33.0)
MCHC: 33.3 g/dL (ref 32.0–36.0)
MCV: 89.6 fL (ref 80.0–100.0)
MPV: 9.3 fL (ref 7.5–12.5)
Monocytes Relative: 2.1 %
Neutro Abs: 7953 cells/uL — ABNORMAL HIGH (ref 1500–7800)
Neutrophils Relative %: 87.4 %
Platelets: 354 10*3/uL (ref 140–400)
RBC: 5.37 10*6/uL (ref 4.20–5.80)
RDW: 13.2 % (ref 11.0–15.0)
Total Lymphocyte: 9.5 %
WBC: 9.1 10*3/uL (ref 3.8–10.8)

## 2020-08-11 LAB — SEDIMENTATION RATE: Sed Rate: 6 mm/h (ref 0–20)

## 2020-08-11 MED ORDER — PREDNISONE 10 MG PO TABS: 20.0000 mg | ORAL_TABLET | Freq: Every day | ORAL | 0 refills | Status: DC

## 2020-08-11 MED ORDER — METHOTREXATE 2.5 MG PO TABS: 15.0000 mg | ORAL_TABLET | ORAL | 0 refills | Status: AC

## 2020-08-11 NOTE — Progress Notes (Signed)
Blood test shows normal liver and kidney function no problems with the methotrexate he can continue current medications. His inflammatory marker lab is now normal on this prednisone dose so he should try to reduce this partially like we discussed in clinic down to 2 and then 1 tablet daily if tolerable or can go back a step if symptoms become very uncontrolled doing so.

## 2020-08-11 NOTE — Progress Notes (Signed)
Sent new Rx

## 2020-08-11 NOTE — Progress Notes (Signed)
Please sign and send to pharmacy- previous Rx printed.

## 2020-08-26 ENCOUNTER — Telehealth: Payer: Self-pay

## 2020-08-26 DIAGNOSIS — M059 Rheumatoid arthritis with rheumatoid factor, unspecified: Secondary | ICD-10-CM

## 2020-08-26 MED ORDER — PREDNISONE 10 MG PO TABS: 20.0000 mg | ORAL_TABLET | Freq: Every day | ORAL | 0 refills | Status: DC

## 2020-08-26 NOTE — Telephone Encounter (Signed)
I spoke with Peter Mcmillan symptoms sound consistent with RA flare seems to be inadequately controlled on methotrexate so far. Discussed he can go back up on prednisone to 30mg  for now. Will look into starting new medication discussed TNF inhibitors.  He has some history of what sounds like melanoma previously seen by dermatology 3 years ago does not recall physician's name. He says his PCP with [name] has their information, can we get information of previous dermatology biopsy and/or dermatology office note?

## 2020-08-26 NOTE — Telephone Encounter (Signed)
Patient's wife Jenny Reichmann called stating Peter Mcmillan can barely move and unable to get out of bed.  She states his knees and hands are very swollen.   Please call back on her cell [phone]

## 2020-08-26 NOTE — Telephone Encounter (Signed)
Spoke with patient's wife, Jenny Reichmann. Patient is unable to get up this morning due to sudden onset of swelling and pain in bilateral knees and hands.   Patient is currently taking Prednisone 10 mg daily.

## 2020-08-26 NOTE — Telephone Encounter (Signed)
Received dermatology office note, I will put these records in Dr. Marveen Reeks folder to be reviewed next week.   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

## 2020-08-26 NOTE — Telephone Encounter (Signed)
LVM for Madolyn Frieze, PA-C, office asking for return call from medical records regarding dermatology records.

## 2020-08-27 NOTE — Telephone Encounter (Signed)
Thanks for getting this, I will take a look next week.

## 2020-09-22 ENCOUNTER — Ambulatory Visit: Payer: Commercial Managed Care - PPO | Admitting: Internal Medicine

## 2020-09-22 NOTE — Progress Notes (Deleted)
Office Visit Note  Patient: Peter Mcmillan             Date of Birth: Dec 08, 1955           MRN: [id number]             PCP: Lois Huxley, PA Referring: Lois Huxley, [place] Visit Date: 09/22/2020   Subjective:  No chief complaint on file.   History of Present Illness: Peter Mcmillan is a 65 y.o. male here for follow up ***     No Rheumatology ROS completed.   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[place]) 06/11/2020   Prediabetes 06/11/2020   Tobacco user 06/11/2020   Vitamin D deficiency 06/11/2020   Bilateral hand pain 06/11/2020   Bilateral shoulder pain 06/11/2020   Atrial flutter ([place]) 12/09/2013   PULMONARY NODULE 08/18/2008   COUGH 08/18/2008    Past Medical History:  Diagnosis Date   Atrial flutter ([facility])    Erectile dysfunction    Rheumatoid arthritis ([place])    Tobacco abuse    Smokeless tobacco    Family History  Problem Relation Age of Onset   Heart attack Father    Heart attack Mother    Dementia Mother    Healthy Sister    Healthy Son    Past Surgical History:  Procedure Laterality Date   [place]  2020   Social History   Social History Narrative   Not on file   Immunization History  Administered Date(s) Administered   Hep A / Hep B 02/17/2011   Influenza Split 01/29/2017   Moderna Sars-Covid-2 Vaccination 07/09/2019, 07/09/2019, 08/06/2019, 08/06/2019   Pneumococcal Polysaccharide-23 02/12/2009   Tdap 02/12/2009, 06/13/2017     Objective: Vital Signs: There were no vitals taken for this visit.   Physical Exam    Musculoskeletal Exam:   CDAI Exam: CDAI Score: -- Patient Global: --; Provider Global: -- Swollen: --; Tender: -- Joint Exam 09/22/2020   No joint exam has been documented for this visit   There is currently no information documented on the homunculus. Go to the Rheumatology activity and complete the homunculus joint exam.  Investigation: No additional findings.  Imaging: No results found.  Recent Labs: Lab Results  Component Value Date   WBC 9.1 08/10/2020   HGB 16.0 08/10/2020   PLT 354 08/10/2020   NA 138 08/10/2020   K 4.4 08/10/2020   CL 101 08/10/2020   CO2 28 08/10/2020   GLUCOSE 114 (H) 08/10/2020   BUN 15 08/10/2020   CREATININE 0.83 08/10/2020   BILITOT 0.5 08/10/2020   AST 15 08/10/2020   ALT 22 08/10/2020   PROT 6.7 08/10/2020   CALCIUM 9.4 08/10/2020   GFRAA 108 08/10/2020    Speciality Comments: No specialty comments available.  Procedures:  No procedures performed Allergies: Codeine, Other, Penicillin g, and Penicillins   Assessment / Plan:     Visit Diagnoses: No diagnosis found.  ***  Orders: No orders of the defined types were placed in this encounter.  No orders of the defined types were placed in this encounter.    Follow-Up  Instructions: No follow-ups on file.   Collier Salina, MD  Note - This record has been created using [employer].  Chart creation errors have been sought, but may not always  have been located. Such creation errors do not reflect on  the standard of medical care.

## 2020-09-23 ENCOUNTER — Other Ambulatory Visit: Payer: Self-pay

## 2020-09-23 ENCOUNTER — Encounter: Payer: Self-pay | Admitting: Internal Medicine

## 2020-09-23 ENCOUNTER — Ambulatory Visit (INDEPENDENT_AMBULATORY_CARE_PROVIDER_SITE_OTHER): Payer: Commercial Managed Care - PPO | Admitting: Internal Medicine

## 2020-09-23 VITALS — BP 120/84 | HR 163 | Ht 77.0 in | Wt 225.0 lb

## 2020-09-23 DIAGNOSIS — M059 Rheumatoid arthritis with rheumatoid factor, unspecified: Secondary | ICD-10-CM | POA: Diagnosis not present

## 2020-09-23 DIAGNOSIS — Z79899 Other long term (current) drug therapy: Secondary | ICD-10-CM

## 2020-09-23 MED ORDER — FOLIC ACID 1 MG PO TABS: 1.0000 mg | ORAL_TABLET | Freq: Every day | ORAL | 0 refills | Status: DC

## 2020-09-23 MED ORDER — PREDNISONE 10 MG PO TABS: 20.0000 mg | ORAL_TABLET | Freq: Every day | ORAL | 0 refills | Status: DC

## 2020-09-23 MED ORDER — METHOTREXATE 2.5 MG PO TABS: 15.0000 mg | ORAL_TABLET | ORAL | 2 refills | Status: AC

## 2020-09-23 NOTE — Progress Notes (Signed)
Office Visit Note  Patient: Peter Mcmillan             Date of Birth: [birth year]           MRN: [id number]             PCP: Lois Huxley, PA Referring: Lois Huxley, [place] Visit Date: 09/23/2020   Subjective:  Follow-up (Patient is taking Prednisone 20 mg daily. Patient was taking MTX, last dose 09/06/2020, and noticed improvement while taking as prescribed. Currently, patient is having increased symptoms. )   History of Present Illness: Peter Mcmillan is a 65 y.o. male here for follow-up of seropositive rheumatoid arthritis on methotrexate 15 mg p.o. weekly and prednisone 20 mg daily.  He feels his symptoms have improved taking the medications although worsened greatly when he decrease his prednisone down to 10 mg as directed and so went back to taking 20 daily.  He has been out of the methotrexate since about 2 weeks ago.   Review of Systems  Constitutional:  Negative for fatigue.  HENT:  Negative for mouth sores, mouth dryness and nose dryness.   Eyes:  Negative for pain, itching, visual disturbance and dryness.  Respiratory:  Positive for cough and shortness of breath. Negative for hemoptysis and difficulty breathing.   Cardiovascular:  Positive for chest pain and palpitations. Negative for swelling in legs/feet.  Gastrointestinal:  Negative for abdominal pain, blood in stool, constipation and diarrhea.  Endocrine: Negative for increased urination.  Genitourinary:  Negative for painful urination.  Musculoskeletal:  Positive for joint pain, joint pain, joint swelling and morning stiffness. Negative for myalgias, muscle weakness, muscle tenderness and myalgias.  Skin:  Negative for color change, rash and redness.  Allergic/Immunologic: Negative for susceptible to infections.  Neurological:  Negative for dizziness, numbness, headaches, memory loss and weakness.  Hematological:  Negative for swollen glands.  Psychiatric/Behavioral:  Negative for confusion and sleep disturbance.     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[place]) 06/11/2020   Prediabetes 06/11/2020   Tobacco user 06/11/2020   Vitamin D deficiency 06/11/2020   Bilateral hand pain 06/11/2020   Bilateral shoulder pain 06/11/2020   Atrial flutter ([place]) 12/09/2013   PULMONARY NODULE 08/18/2008   COUGH 08/18/2008    Past Medical History:  Diagnosis Date   Atrial flutter ([facility])    Erectile dysfunction    Rheumatoid arthritis ([place])    Tobacco abuse    Smokeless tobacco    Family History  Problem Relation Age of Onset   Heart attack Father    Heart attack Mother    Dementia Mother    Healthy Sister    Healthy Son    Past Surgical History:  Procedure Laterality Date   [place]  2020   Social History   Social History Narrative   Not on file   Immunization History  Administered Date(s) Administered   Hep A / Hep B 02/17/2011   Influenza Split 01/29/2017   Moderna Sars-Covid-2 Vaccination 07/09/2019, 07/09/2019, 08/06/2019, 08/06/2019   Pneumococcal Polysaccharide-23 02/12/2009   Tdap 02/12/2009, 06/13/2017     Objective: Vital Signs: BP  120/84 (BP Location: Right Arm, Patient Position: Sitting, Cuff Size: Normal)   Pulse (!) 163   Ht 6\' 5"  (1.956 m)   Wt 225 lb (102.1 kg)   BMI 26.68 kg/m    Physical Exam Skin:    General: Skin is warm and dry.     Findings: No rash.  Neurological:     Mental Status: He is alert.  Psychiatric:        Mood and Affect: Mood normal.     Musculoskeletal Exam:  Left hand joint tenderness bilateral shoulder tenderness chronic changes in  the right hand without any tenderness or synovitis on exam currently Knees full range of motion no tenderness or effusion bilaterally  CDAI Exam: CDAI Score: -- Patient Global: --; Provider Global: -- Swollen: 0 ; Tender: 7  Joint Exam 09/23/2020      Right  Left  Glenohumeral   Tender   Tender  Wrist      Tender  MCP 2      Tender  MCP 3      Tender  MCP 4      Tender  MCP 5      Tender     Investigation: No additional findings.  Imaging: No results found.  Recent Labs: Lab Results  Component Value Date   WBC 9.1 08/10/2020   HGB 16.0 08/10/2020   PLT 354 08/10/2020   NA 138 08/10/2020   K 4.4 08/10/2020   CL 101 08/10/2020   CO2 28 08/10/2020   GLUCOSE 114 (H) 08/10/2020   BUN 15 08/10/2020   CREATININE 0.83 08/10/2020   BILITOT 0.5 08/10/2020   AST 15 08/10/2020   ALT 22 08/10/2020   PROT 6.7 08/10/2020   CALCIUM 9.4 08/10/2020   GFRAA 108 08/10/2020    Speciality Comments: No specialty comments available.  Procedures:  No procedures performed Allergies: Codeine, Other, Penicillin g, and Penicillins   Assessment / Plan:     Visit Diagnoses: Seropositive rheumatoid arthritis ([place]) - Plan: predniSONE (DELTASONE) 10 MG tablet, folic acid (FOLVITE) 1 MG tablet  Disease activity is low today but is still requiring excessive amount of glucocorticoids for symptom control.  Discussed treatment options besides methotrexate.  Due to his history of melanoma and multiple nonmelanoma skin lesions seen with dermatology I believe he may not be a good candidate for TNF inhibitors.  Recommend treatment with Orencia for this risk profile.  We will continue methotrexate 15 mg weekly at this time.  Discussed recommend he could try decreasing the prednisone stepwise by 5 mg increment and see if this is tolerable.  High risk medication use - Plan: CBC with Differential/Platelet, COMPLETE METABOLIC PANEL WITH GFR, QuantiFERON-TB Gold Plus, IgG, IgA, IgM  Checking CBC and CMP  for methotrexate toxicity monitoring.  Anticipating starting a biologic DMARD checking TB screen and quantitative immunoglobulins.  Orders: Orders Placed This Encounter  Procedures   CBC with Differential/Platelet   COMPLETE METABOLIC PANEL WITH GFR   QuantiFERON-TB Gold Plus   IgG, IgA, IgM    Meds ordered this encounter  Medications   predniSONE (DELTASONE) 10 MG tablet    Sig: Take 2 tablets (20 mg total) by mouth daily.    Dispense:  60 tablet    Refill:  0   methotrexate (RHEUMATREX) 2.5 MG tablet    Sig: Take 6 tablets (15 mg total) by mouth once a week. Caution:Chemotherapy. Protect from light.    Dispense:  30 tablet    Refill:  2  folic acid (FOLVITE) 1 MG tablet    Sig: Take 1 tablet (1 mg total) by mouth daily.    Dispense:  90 tablet    Refill:  0    Follow-Up Instructions: No follow-ups on file.   Collier Salina, MD  Note - This record has been created using [employer].  Chart creation errors have been sought, but may not always  have been located. Such creation errors do not reflect on  the standard of medical care.

## 2020-09-25 LAB — CBC WITH DIFFERENTIAL/PLATELET
Absolute Monocytes: 513 cells/uL (ref 200–950)
Basophils Absolute: 18 cells/uL (ref 0–200)
Basophils Relative: 0.2 %
Eosinophils Absolute: 9 cells/uL — ABNORMAL LOW (ref 15–500)
Eosinophils Relative: 0.1 %
HCT: 42.1 % (ref 38.5–50.0)
Hemoglobin: 14.3 g/dL (ref 13.2–17.1)
Lymphs Abs: 729 cells/uL — ABNORMAL LOW (ref 850–3900)
MCH: 31 pg (ref 27.0–33.0)
MCHC: 34 g/dL (ref 32.0–36.0)
MCV: 91.3 fL (ref 80.0–100.0)
MPV: 9.9 fL (ref 7.5–12.5)
Monocytes Relative: 5.7 %
Neutro Abs: 7731 cells/uL (ref 1500–7800)
Neutrophils Relative %: 85.9 %
Platelets: 352 10*3/uL (ref 140–400)
RBC: 4.61 10*6/uL (ref 4.20–5.80)
RDW: 13.6 % (ref 11.0–15.0)
Total Lymphocyte: 8.1 %
WBC: 9 10*3/uL (ref 3.8–10.8)

## 2020-09-25 LAB — COMPLETE METABOLIC PANEL WITH GFR
AG Ratio: 1.4 (calc) (ref 1.0–2.5)
ALT: 26 U/L (ref 9–46)
AST: 14 U/L (ref 10–35)
Albumin: 3.8 g/dL (ref 3.6–5.1)
Alkaline phosphatase (APISO): 41 U/L (ref 35–144)
BUN: 13 mg/dL (ref 7–25)
CO2: 25 mmol/L (ref 20–32)
Calcium: 9 mg/dL (ref 8.6–10.3)
Chloride: 104 mmol/L (ref 98–110)
Creat: 0.78 mg/dL (ref 0.70–1.25)
GFR, Est African American: 111 mL/min/{1.73_m2} (ref >=60)
GFR, Est Non African American: 95 mL/min/{1.73_m2} (ref >=60)
Globulin: 2.8 g/dL (calc) (ref 1.9–3.7)
Glucose, Bld: 109 mg/dL — ABNORMAL HIGH (ref 65–99)
Potassium: 5.1 mmol/L (ref 3.5–5.3)
Sodium: 140 mmol/L (ref 135–146)
Total Bilirubin: 0.6 mg/dL (ref 0.2–1.2)
Total Protein: 6.6 g/dL (ref 6.1–8.1)

## 2020-09-25 LAB — IGG, IGA, IGM
IgG (Immunoglobin G), Serum: 938 mg/dL (ref 600–1540)
IgM, Serum: 96 mg/dL (ref 50–300)
Immunoglobulin A: 224 mg/dL (ref 70–320)

## 2020-09-25 LAB — QUANTIFERON-TB GOLD PLUS
Mitogen-NIL: 0.2 IU/mL
NIL: 0.05 IU/mL
QuantiFERON-TB Gold Plus: UNDETERMINED — AB
TB1-NIL: <0 IU/mL
TB2-NIL: <0 IU/mL

## 2020-10-05 ENCOUNTER — Observation Stay (HOSPITAL_COMMUNITY)
Admission: EM | Admit: 2020-10-05 | Discharge: 2020-10-06 | Disposition: A | Discharge status: Home / Self Care | Payer: Commercial Managed Care - PPO | Attending: Cardiology | Admitting: Cardiology

## 2020-10-05 ENCOUNTER — Other Ambulatory Visit: Payer: Self-pay

## 2020-10-05 ENCOUNTER — Emergency Department (HOSPITAL_COMMUNITY): Payer: Commercial Managed Care - PPO

## 2020-10-05 ENCOUNTER — Encounter (HOSPITAL_COMMUNITY): Payer: Self-pay

## 2020-10-05 DIAGNOSIS — F17290 Nicotine dependence, other tobacco product, uncomplicated: Secondary | ICD-10-CM | POA: Diagnosis not present

## 2020-10-05 DIAGNOSIS — I10 Essential (primary) hypertension: Secondary | ICD-10-CM | POA: Diagnosis not present

## 2020-10-05 DIAGNOSIS — R55 Syncope and collapse: Secondary | ICD-10-CM | POA: Diagnosis present

## 2020-10-05 DIAGNOSIS — Z79899 Other long term (current) drug therapy: Secondary | ICD-10-CM | POA: Insufficient documentation

## 2020-10-05 DIAGNOSIS — Z720 Tobacco use: Secondary | ICD-10-CM

## 2020-10-05 DIAGNOSIS — Z20822 Contact with and (suspected) exposure to covid-19: Secondary | ICD-10-CM | POA: Insufficient documentation

## 2020-10-05 DIAGNOSIS — I4892 Unspecified atrial flutter: Principal | ICD-10-CM | POA: Diagnosis present

## 2020-10-05 LAB — LIPID PANEL
Cholesterol: 137 mg/dL (ref 0–200)
HDL: 54 mg/dL (ref >40)
LDL Cholesterol: 76 mg/dL (ref 0–99)
Total CHOL/HDL Ratio: 2.5 RATIO
Triglycerides: 34 mg/dL (ref <150)
VLDL: 7 mg/dL (ref 0–40)

## 2020-10-05 LAB — CBC
HCT: 41.6 % (ref 39.0–52.0)
Hemoglobin: 13.7 g/dL (ref 13.0–17.0)
MCH: 30.9 pg (ref 26.0–34.0)
MCHC: 32.9 g/dL (ref 30.0–36.0)
MCV: 93.7 fL (ref 80.0–100.0)
Platelets: 304 10*3/uL (ref 150–400)
RBC: 4.44 MIL/uL (ref 4.22–5.81)
RDW: 13.8 % (ref 11.5–15.5)
WBC: 9.1 10*3/uL (ref 4.0–10.5)
nRBC: 0 % (ref 0.0–0.2)

## 2020-10-05 LAB — BASIC METABOLIC PANEL
Anion gap: 7 (ref 5–15)
BUN: 13 mg/dL (ref 8–23)
CO2: 26 mmol/L (ref 22–32)
Calcium: 8.3 mg/dL — ABNORMAL LOW (ref 8.9–10.3)
Chloride: 106 mmol/L (ref 98–111)
Creatinine, Ser: 0.8 mg/dL (ref 0.61–1.24)
GFR, Estimated: >60 mL/min (ref >60)
Glucose, Bld: 109 mg/dL — ABNORMAL HIGH (ref 70–99)
Potassium: 3.6 mmol/L (ref 3.5–5.1)
Sodium: 139 mmol/L (ref 135–145)

## 2020-10-05 LAB — TSH: TSH: 1.917 u[IU]/mL (ref 0.350–4.500)

## 2020-10-05 LAB — MAGNESIUM: Magnesium: 1.9 mg/dL (ref 1.7–2.4)

## 2020-10-05 LAB — RESP PANEL BY RT-PCR (FLU A&B, COVID) ARPGX2
Influenza A by PCR: NEGATIVE
Influenza B by PCR: NEGATIVE
SARS Coronavirus 2 by RT PCR: NEGATIVE

## 2020-10-05 LAB — HIV ANTIBODY (ROUTINE TESTING W REFLEX): HIV Screen 4th Generation wRfx: NONREACTIVE

## 2020-10-05 MED ORDER — ACETAMINOPHEN 325 MG PO TABS: 650.0000 mg | ORAL_TABLET | ORAL | Status: DC | PRN

## 2020-10-05 MED ORDER — FOLIC ACID 1 MG PO TABS
1.0000 mg | ORAL_TABLET | Freq: Every day | ORAL | Status: DC
  Administered 2020-10-05 – 2020-10-06 (×2): 1 mg via ORAL
  Filled 2020-10-05 (×2): qty 1

## 2020-10-05 MED ORDER — ONDANSETRON HCL 4 MG/2ML IJ SOLN: 4.0000 mg | Freq: Four times a day (QID) | INTRAMUSCULAR | Status: DC | PRN

## 2020-10-05 MED ORDER — METOPROLOL TARTRATE 12.5 MG HALF TABLET
12.5000 mg | ORAL_TABLET | Freq: Three times a day (TID) | ORAL | Status: DC
  Administered 2020-10-05 – 2020-10-06 (×4): 12.5 mg via ORAL
  Filled 2020-10-05 (×4): qty 1

## 2020-10-05 MED ORDER — METOPROLOL TARTRATE 25 MG PO TABS: 12.5000 mg | ORAL_TABLET | Freq: Three times a day (TID) | ORAL | Status: DC

## 2020-10-05 MED ORDER — APIXABAN 5 MG PO TABS
5.0000 mg | ORAL_TABLET | Freq: Two times a day (BID) | ORAL | Status: DC
  Administered 2020-10-05 – 2020-10-06 (×3): 5 mg via ORAL
  Filled 2020-10-05 (×3): qty 1

## 2020-10-05 MED ORDER — PREDNISONE 20 MG PO TABS
20.0000 mg | ORAL_TABLET | Freq: Every day | ORAL | Status: DC
  Administered 2020-10-06: 20 mg via ORAL
  Filled 2020-10-05: qty 1

## 2020-10-05 MED ORDER — DILTIAZEM HCL-DEXTROSE 125-5 MG/125ML-% IV SOLN (PREMIX)
5.0000 mg/h | INTRAVENOUS | Status: DC
  Administered 2020-10-05: 5 mg/h via INTRAVENOUS
  Administered 2020-10-05: 15 mg/h via INTRAVENOUS
  Administered 2020-10-06: 5 mg/h via INTRAVENOUS
  Filled 2020-10-05 (×3): qty 125

## 2020-10-05 MED ORDER — DILTIAZEM LOAD VIA INFUSION
10.0000 mg | Freq: Once | INTRAVENOUS | Status: AC
  Administered 2020-10-05: 10 mg via INTRAVENOUS
  Filled 2020-10-05: qty 10

## 2020-10-05 MED ORDER — SODIUM CHLORIDE 0.9 % IV BOLUS
1000.0000 mL | Freq: Once | INTRAVENOUS | Status: AC
  Administered 2020-10-05: 1000 mL via INTRAVENOUS

## 2020-10-05 NOTE — ED Triage Notes (Signed)
Coming from home after syncopal episode. Pt was in kitchen cooking grits when everything started to go black and he fell to ground. Unsure if he hit his head; reports jaw pain. Pt with hx of afib and takes cardizem. Pt HR in 150's with EMS. Given 20 mg Cardizem PTA. GCS 15. Pt states dehydration has previously caused this to happen.

## 2020-10-05 NOTE — ED Notes (Signed)
Pt stood up to use urinal. Pt states he was slightly dizzy upon standing. Pt wobbling when using urinal. Staff assisting for safety. HR increased to 140 Pt back in bed, on monitors

## 2020-10-05 NOTE — ED Provider Notes (Signed)
[facility] EMERGENCY DEPARTMENT Provider Note   CSN: [id number] Arrival date & time: 10/05/20  0543     History Chief Complaint  Patient presents with   Irregular Heart Beat    Peter Mcmillan is a 65 y.o. male.  HPI     This is a 65 year old male with a history of atrial fibrillation no longer on anticoagulation, rheumatoid arthritis, tobacco abuse who presents with syncope.  Patient reports that he got up this morning to make breakfast.  He remembers putting grits in the microwave.  He started to feel lightheaded and passed out.  He is unsure whether he hit his head.  No known history of syncope.  He does have a history of atrial fibrillation but is no longer on Eliquis.  Per EMS, he had a heart rate in the 150s.  He was given 20 mg of Cardizem.  He does not have any chest pain, shortness of breath, or palpitations.  He is unsure when he may have gone into atrial fibrillation.  Currently he feels a little nauseous.  No vomiting.  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   Prediabetes 06/11/2020   Tobacco user 06/11/2020   Vitamin D deficiency 06/11/2020   Bilateral hand pain 06/11/2020   Bilateral shoulder pain 06/11/2020   Atrial flutter ([place]) 12/09/2013   PULMONARY NODULE 08/18/2008   COUGH 08/18/2008    Past Surgical History:  Procedure Laterality Date   [place]  2020        Family History  Problem Relation Age of Onset   Heart attack Father    Heart attack Mother    Dementia Mother    Healthy Sister    Healthy Son     Social History   Tobacco Use   Smoking status: Never   Smokeless tobacco: Current    Types: Snuff  Vaping Use   Vaping Use: Never used  Substance Use Topics   Alcohol use: Yes    Comment: Rare   Drug use: No    Home Medications Prior to Admission medications   Medication Sig Start Date End Date Taking? Authorizing Provider  cholecalciferol (VITAMIN D3) 25 MCG (1000 UNIT) tablet Take 1,000 Units by mouth daily.    [provider]  diltiazem (CARDIZEM CD) 120 MG 24 hr capsule Take 1 capsule (120 mg total) by mouth 2 (two) times daily. Pt overdue for an appt. Must call and schedule for further refills 1st attempt 12/13/16   Evans Lance, MD  folic acid (FOLVITE) 1 MG tablet Take 1 tablet (1 mg total) by mouth daily. 09/23/20   Collier Salina, MD  loratadine (CLARITIN) 10 MG tablet as needed.    [provider]  methotrexate (RHEUMATREX) 2.5 MG tablet Take 6 tablets (15 mg total) by mouth once a week. Caution:Chemotherapy. Protect from light. 09/23/20 10/28/20  Collier Salina, MD  omeprazole (PRILOSEC) 40 MG capsule  as needed. 12/17/19   [provider]  predniSONE (DELTASONE) 10 MG tablet Take 2 tablets (20 mg total) by mouth daily. 09/23/20   Collier Salina, MD    Allergies    Codeine, Other, Penicillin g, and Penicillins  Review of Systems   Review of Systems  Constitutional:  Negative for fever.  Respiratory:  Negative for shortness of breath.   Cardiovascular:  Negative for chest pain.  Gastrointestinal:  Positive for nausea. Negative for abdominal pain and vomiting.  Neurological:  Positive for syncope.  All other systems reviewed and are negative.  Physical Exam Updated Vital Signs BP 121/80   Pulse 95   Temp 97.6 F (36.4 C) (Oral)   Resp (!) 26   Ht 1.956 m (6\' 5" )    Wt 102 kg   SpO2 97%   BMI 26.67 kg/m   Physical Exam Vitals and nursing note reviewed.  Constitutional:      Appearance: He is well-developed. He is not ill-appearing.  HENT:     Head: Normocephalic and atraumatic.     Nose: Nose normal.     Mouth/Throat:     Mouth: Mucous membranes are moist.  Eyes:     Pupils: Pupils are equal, round, and reactive to light.  Cardiovascular:     Rate and Rhythm: Tachycardia present. Rhythm irregular.     Heart sounds: Normal heart sounds. No murmur heard. Pulmonary:     Effort: Pulmonary effort is normal. No respiratory distress.     Breath sounds: Normal breath sounds. No wheezing.  Abdominal:     General: Bowel sounds are normal.     Palpations: Abdomen is soft.     Tenderness: There is no abdominal tenderness. There is no rebound.  Musculoskeletal:     Cervical back: Neck supple.     Right lower leg: No edema.     Left lower leg: No edema.  Lymphadenopathy:     Cervical: No cervical adenopathy.  Skin:    General: Skin is warm and dry.  Neurological:     Mental Status: He is alert and oriented to person, place, and time.  Psychiatric:        Mood and Affect: Mood normal.    ED Results / Procedures / Treatments   Labs (all labs ordered are listed, but only abnormal results are displayed) Labs Reviewed  BASIC METABOLIC PANEL - Abnormal; Notable for the following components:      Result Value   Glucose, Bld 109 (*)    Calcium 8.3 (*)    All other components within normal limits  RESP PANEL BY RT-PCR (FLU A&B, COVID) ARPGX2  MAGNESIUM  CBC    EKG EKG Interpretation  Date/Time:  Tuesday October 05 2020 05:49:57 EDT Ventricular Rate:  104 PR Interval:    QRS Duration: 81 QT Interval:  374 QTC Calculation: 492 R Axis:   32 Text Interpretation: Atrial flutter with predominant 3:1 AV block Borderline repolarization abnormality Borderline prolonged QT interval Confirmed by Thayer Jew [phone]) on 10/05/2020 6:20:50  AM  Radiology CT Head Wo Contrast  Result Date: 10/05/2020 CLINICAL DATA:  Minor head trauma with normal mental status. Fainting EXAM: CT HEAD WITHOUT CONTRAST TECHNIQUE: Contiguous axial images were obtained from the base of the skull through the vertex without intravenous contrast. COMPARISON:  None. FINDINGS: Brain: No evidence of acute infarction, hemorrhage, hydrocephalus, extra-axial collection or mass lesion/mass effect. Vascular: No hyperdense vessel or unexpected calcification. Skull: Normal. Negative for fracture or focal lesion. Sinuses/Orbits:  No acute finding. IMPRESSION: Negative head CT. Electronically Signed   By: Monte Fantasia M.D.   On: 10/05/2020 06:21   DG Chest Port 1 View  Result Date: 10/05/2020 CLINICAL DATA:  65 year old male with history of atrial flutter. Syncopal episode. EXAM: PORTABLE CHEST 1 VIEW COMPARISON:  Chest x-ray 07/31/2020. FINDINGS: Transcutaneous defibrillator pads project over the left hemithorax. Lung volumes are normal. No consolidative airspace disease. No pleural effusions. No pneumothorax. No pulmonary nodule or mass noted. Pulmonary vasculature and the cardiomediastinal silhouette are within normal limits. Old healed right-sided rib fractures are noted. IMPRESSION: No radiographic evidence of acute cardiopulmonary disease. Electronically Signed   By: Vinnie Langton M.D.   On: 10/05/2020 06:15    Procedures .Critical Care  Date/Time: 10/05/2020 6:58 AM Performed by: Merryl Hacker, MD Authorized by: Merryl Hacker, MD   Critical care provider statement:    Critical care time (minutes):  45   Critical care was time spent personally by me on the following activities:  Discussions with consultants, evaluation of patient's response to treatment, examination of patient, ordering and performing treatments and interventions, ordering and review of laboratory studies, ordering and review of radiographic studies, pulse oximetry, re-evaluation of  patient's condition, obtaining history from patient or surrogate and review of old charts   Medications Ordered in ED Medications  diltiazem (CARDIZEM) 1 mg/mL load via infusion 10 mg (10 mg Intravenous Bolus from Bag 10/05/20 0645)    And  diltiazem (CARDIZEM) 125 mg in dextrose 5% 125 mL (1 mg/mL) infusion (5 mg/hr Intravenous New Bag/Given 10/05/20 0646)  sodium chloride 0.9 % bolus 1,000 mL (0 mLs Intravenous Stopped 10/05/20 0647)    ED Course  I have reviewed the triage vital signs and the nursing notes.  Pertinent labs & imaging results that were available during my care of the patient were reviewed by me and considered in my medical decision making (see chart for details).    MDM Rules/Calculators/A&P                           This patients CHA2DS2-VASc Score and unadjusted Ischemic Stroke Rate (% per year) is equal to 0.6 % stroke rate/year from a score of 1  Above score calculated as 1 point each if present [CHF, HTN, DM, Vascular=MI/PAD/Aortic Plaque, Age if 65-74, or Male] Above score calculated as 2 points each if present [Age > 75, or Stroke/TIA/TE]  Patient presents with an episode of syncope.  History of atrial fibrillation/flutter.  He is in atrial flutter with RVR.  He received Cardizem.  He is awake, alert.  Neurologically intact.  EKG shows atrial flutter.  Initial rates in the 90s.  However, patient trended upwards into the 115-120 range.  He was never hypotensive.  He was placed on a diltiazem drip.  I highly suspect that his syncopal episode was a result of going into atrial flutter although he does not feel palpitations.  He is not having any chest pain or shortness of breath.  He does not appear volume overloaded.  Chest x-ray without pneumothorax or pneumonia, pulmonary edema.  Spoke with cardiology.  They will evaluate the patient with regards to potential cardioversion versus rate control.    Final Clinical Impression(s) / ED Diagnoses Final diagnoses:  Atrial  flutter with rapid ventricular response ([place])    Rx / DC Orders ED Discharge Orders          Ordered    Amb referral  to [facility]        10/05/20 0555             Merryl Hacker, MD 10/05/20 [phone]

## 2020-10-05 NOTE — ED Notes (Signed)
Notified Dr. Johney Frame regarding Cardizem drip at [facility] dose. Continue Cardizem at this time. PO meds being ordered.

## 2020-10-05 NOTE — ED Notes (Signed)
Attempted report X1

## 2020-10-05 NOTE — ED Notes (Signed)
Patient transported to CT 

## 2020-10-05 NOTE — H&P (Addendum)
Cardiology Admission History and Physical:   Patient ID: Peter Mcmillan MRN: [id number]; DOB: September 02, 1955   Admission date: 10/05/2020  PCP:  Lois Huxley, PA   [facility] Providers Cardiologist:  None        Chief Complaint:  Syncope; Aflutter with RVR  Patient Profile:   Peter Mcmillan is a 65 y.o. male with history of paroxysmal aflutter not currently on [facility] and tobacco abuse who is being seen 10/05/2020 for the evaluation of syncope in the setting of recurrent Afib/flutter with RVR.  History of Present Illness:   Peter Mcmillan is a 65 year old male with history of paroxysmal atrial flutter originally diagnosed in 2015. He was initially on eliquis for [facility] and dilt for rate control and underwent successful DCCV in 10/2015 with Dr. Lovena Le. His eliquis was subsequently stopped due to lack of recurrence of flutter and CHADs-vasc of 1 and he has not had regular follow-up with Dr. Lovena Le since that time. Per review of the ECGs in our system, there does not appear to be recurrence of his Aflutter since 2017.  The patient was doing well until this morning when he woke up to make breakfast and suffered a a syncopal episode. He states he felt normal and then all of a sudden loss consciousness. He denies any preceding symptoms like palpitaitons, chest pain, lightheadedness, dizziness, nausea or SOB. EMS was called and patient was noted to be in [place] with RVR with HR 150-160s. He was given dilt 20mg  IV with initial rate control, however, he developed recurrent RVR and is now placed on a dilt gtt.  In the ER, labs relatively unremarkable with Cr 0.8, K 3.6, Mg 1.9. CXR without acute pathology. CT head negative for bleed. ECG with Aflutter with variable block. He remains mildly symptomatic with palpitations and dyspnea on exertion. Denies any chest pain or SOB at rest.    Past Medical History:  Diagnosis Date   Atrial flutter ([facility])    Erectile dysfunction    Rheumatoid arthritis ([place])     Tobacco abuse    Smokeless tobacco    Past Surgical History:  Procedure Laterality Date   BACK SURGERY  1999   MELANOMA EXCISION  2020     Medications Prior to Admission: Prior to Admission medications   Medication Sig Start Date End Date Taking? Authorizing Provider  cholecalciferol (VITAMIN D3) 25 MCG (1000 UNIT) tablet Take 1,000 Units by mouth daily.    [provider]  diltiazem (CARDIZEM CD) 120 MG 24 hr capsule Take 1 capsule (120 mg total) by mouth 2 (two) times daily. Pt overdue for an appt. Must call and schedule for further refills 1st attempt 12/13/16   Evans Lance, MD  folic acid (FOLVITE) 1 MG tablet Take 1 tablet (1 mg total) by mouth daily. 09/23/20   Collier Salina, MD  loratadine (CLARITIN) 10 MG tablet as needed.    [provider]  methotrexate (RHEUMATREX) 2.5 MG tablet Take 6 tablets (15 mg total) by mouth once a week. Caution:Chemotherapy. Protect from light. 09/23/20 10/28/20  Collier Salina, MD  omeprazole (PRILOSEC) 40 MG capsule as needed. 12/17/19   [provider]  predniSONE (DELTASONE) 10 MG tablet Take 2 tablets (20 mg total) by mouth daily. 09/23/20   Collier Salina, MD     Allergies:    Allergies  Allergen Reactions   Codeine Other (See Comments)    agitation   Other     Other reaction(s): make feel weird  Other reaction(s): hives/swelling   Penicillin G Other (See Comments)   Penicillins Itching, Swelling and Rash    Social History:   Social History   Socioeconomic History   Marital status: Married    Spouse name: Not on file   Number of children: 1   Years of education: Not on file   Highest education level: Not on file  Occupational History   Occupation: Clinical biochemist  Tobacco Use   Smoking status: Never   Smokeless tobacco: Current    Types: Snuff  Vaping Use   Vaping Use: Never used  Substance and Sexual Activity   Alcohol use: Yes    Comment: Rare   Drug use: No   Sexual activity: Not  on file  Other Topics Concern   Not on file  Social History Narrative   Not on file   Social Determinants of Health   Financial Resource Strain: Not on file  Food Insecurity: Not on file  Transportation Needs: Not on file  Physical Activity: Not on file  Stress: Not on file  Social Connections: Not on file  Intimate Partner Violence: Not on file    Family History:   The patient's family history includes Dementia in his mother; Healthy in his sister and son; Heart attack in his father and mother.    ROS:  Please see the history of present illness.  Review of Systems  Constitutional:  Negative for chills and fever.  HENT:  Negative for sore throat.   Eyes:  Negative for blurred vision.  Respiratory:  Positive for shortness of breath.   Cardiovascular:  Positive for palpitations and leg swelling. Negative for chest pain, orthopnea, claudication and PND.  Gastrointestinal:  Negative for nausea and vomiting.  Genitourinary:  Negative for dysuria.  Musculoskeletal:  Positive for falls, joint pain and myalgias.  Neurological:  Positive for dizziness and loss of consciousness.  Psychiatric/Behavioral:  Negative for substance abuse.       Physical Exam/Data:   Vitals:   10/05/20 0630 10/05/20 0645 10/05/20 0730 10/05/20 0815  BP: 123/90 121/80 123/83 125/90  Pulse: (!) 142 95 (!) 55 (!) 147  Resp: 19 (!) 26 20 19   Temp:    98 F (36.7 C)  TempSrc:    Oral  SpO2: 96% 97% 94% 95%  Weight:      Height:        Intake/Output Summary (Last 24 hours) at 10/05/2020 0818 Last data filed at 10/05/2020 0647 Gross per 24 hour  Intake 1000 ml  Output --  Net 1000 ml   Last 3 Weights 10/05/2020 09/23/2020 08/10/2020  Weight (lbs) 224 lb 13.9 oz 225 lb 222 lb 9.6 oz  Weight (kg) 102 kg 102.059 kg 100.971 kg     Body mass index is 26.67 kg/m.  General:  Well nourished, well developed, in no acute distress HEENT: normal Neck: no JVD Vascular: No carotid bruits; FA pulses 2+ bilaterally  without bruits  Cardiac:  Irregularly irregular, no murmurs Lungs:  clear to auscultation bilaterally, no wheezing, rhonchi or rales  Abd: soft, nontender, no hepatomegaly  Ext: no edema, warm Musculoskeletal:  No deformities, BUE and BLE strength normal and equal Skin: warm and dry  Neuro:  CNs 2-12 intact, no focal abnormalities noted Psych:  Normal affect    EKG:  The ECG that was done was personally reviewed and demonstrates Aflutter with variable block HR 104  Relevant CV Studies: TTE 2015 with LVEF 55%mm mild LAE, mild RAE, no significant valve  disease  Laboratory Data:  High Sensitivity Troponin:  No results for input(s): TROPONINIHS in the last 720 hours.    Chemistry Recent Labs  Lab 10/05/20 0554  NA 139  K 3.6  CL 106  CO2 26  GLUCOSE 109*  BUN 13  CREATININE 0.80  CALCIUM 8.3*  GFRNONAA >60  ANIONGAP 7    No results for input(s): PROT, ALBUMIN, AST, ALT, ALKPHOS, BILITOT in the last 168 hours. Hematology Recent Labs  Lab 10/05/20 0554  WBC 9.1  RBC 4.44  HGB 13.7  HCT 41.6  MCV 93.7  MCH 30.9  MCHC 32.9  RDW 13.8  PLT 304   BNPNo results for input(s): BNP, PROBNP in the last 168 hours.  DDimer No results for input(s): DDIMER in the last 168 hours.   Radiology/Studies:  CT Head Wo Contrast  Result Date: 10/05/2020 CLINICAL DATA:  Minor head trauma with normal mental status. Fainting EXAM: CT HEAD WITHOUT CONTRAST TECHNIQUE: Contiguous axial images were obtained from the base of the skull through the vertex without intravenous contrast. COMPARISON:  None. FINDINGS: Brain: No evidence of acute infarction, hemorrhage, hydrocephalus, extra-axial collection or mass lesion/mass effect. Vascular: No hyperdense vessel or unexpected calcification. Skull: Normal. Negative for fracture or focal lesion. Sinuses/Orbits: No acute finding. IMPRESSION: Negative head CT. Electronically Signed   By: Monte Fantasia M.D.   On: 10/05/2020 06:21   DG Chest Port 1  View  Result Date: 10/05/2020 CLINICAL DATA:  65 year old male with history of atrial flutter. Syncopal episode. EXAM: PORTABLE CHEST 1 VIEW COMPARISON:  Chest x-ray 07/31/2020. FINDINGS: Transcutaneous defibrillator pads project over the left hemithorax. Lung volumes are normal. No consolidative airspace disease. No pleural effusions. No pneumothorax. No pulmonary nodule or mass noted. Pulmonary vasculature and the cardiomediastinal silhouette are within normal limits. Old healed right-sided rib fractures are noted. IMPRESSION: No radiographic evidence of acute cardiopulmonary disease. Electronically Signed   By: Vinnie Langton M.D.   On: 10/05/2020 06:15     Assessment and Plan:   #Aflutter with RVR: CHADs-vasc 0-1. Initially diagnosed in 2015 with successful DCCV in 2017 with Dr. Lovena Le without known recurrence. Has been off eliquis. Now presenting with syncope in the setting of recurrent Aflutter with RVR which is likely the culprit. Given symptoms and syncope, will plan for TEE/DCCV.  -Plan for TEE/DCCV tomorrow; keep NPO at MN -Continue dilt gtt for rate control -Start eliquis 5mg  BID -Check TTE (last 2015) -Check TSH -Plan for EP follow-up as out-patient for consideration of flutter ablation in the future  #Tobacco Abuse: Patient has been using dip tobacco for 40 years. Discussed cessation and patient is motivated to try. -Ongoing discussions about tobacco cessation    After careful review of history and examination, the risks and benefits of transesophageal echocardiogram and DCCV have been explained including risks of esophageal damage, perforation (1:10,000 risk), bleeding, pharyngeal hematoma as well as other potential complications associated with conscious sedation including aspiration, arrhythmia, respiratory failure and death. Alternatives to treatment were discussed, questions were answered. Patient is willing to proceed.   Freada Bergeron, MD  10/05/2020 8:24 AM    Risk  Assessment/Risk Scores:         CHA2DS2-VASc Score = 1  {This indicates a 0.6% annual risk of stroke. The patient's score is based upon: CHF History: No HTN History: No Diabetes History: No Stroke History: No Vascular Disease History: Yes Age Score: 0 Gender Score: 0      Severity of Illness: The appropriate patient  status for this patient is OBSERVATION. Observation status is judged to be reasonable and necessary in order to provide the required intensity of service to ensure the patient's safety. The patient's presenting symptoms, physical exam findings, and initial radiographic and laboratory data in the context of their medical condition is felt to place them at decreased risk for further clinical deterioration. Furthermore, it is anticipated that the patient will be medically stable for discharge from the hospital within 2 midnights of admission. The following factors support the patient status of observation.   " The patient's presenting symptoms include Syncope. " The physical exam findings include Aflutter with RVR. " The initial radiographic and laboratory data are Aflutter with RVR on ECG.   For questions or updates, please contact [place] Please consult [email or web address] for contact info under     Signed, Freada Bergeron, MD  10/05/2020 8:18 AM

## 2020-10-06 ENCOUNTER — Observation Stay (HOSPITAL_COMMUNITY): Payer: Commercial Managed Care - PPO | Admitting: Anesthesiology

## 2020-10-06 ENCOUNTER — Encounter (HOSPITAL_COMMUNITY)
Admission: EM | Disposition: A | Discharge status: Home / Self Care | Payer: Self-pay | Source: Home / Self Care | Attending: Emergency Medicine

## 2020-10-06 ENCOUNTER — Observation Stay (HOSPITAL_BASED_OUTPATIENT_CLINIC_OR_DEPARTMENT_OTHER): Payer: Commercial Managed Care - PPO

## 2020-10-06 ENCOUNTER — Encounter (HOSPITAL_COMMUNITY): Payer: Self-pay | Admitting: Cardiology

## 2020-10-06 DIAGNOSIS — Z79899 Other long term (current) drug therapy: Secondary | ICD-10-CM | POA: Diagnosis not present

## 2020-10-06 DIAGNOSIS — I340 Nonrheumatic mitral (valve) insufficiency: Secondary | ICD-10-CM | POA: Diagnosis not present

## 2020-10-06 DIAGNOSIS — I4892 Unspecified atrial flutter: Secondary | ICD-10-CM

## 2020-10-06 DIAGNOSIS — I10 Essential (primary) hypertension: Secondary | ICD-10-CM | POA: Diagnosis not present

## 2020-10-06 DIAGNOSIS — Z20822 Contact with and (suspected) exposure to covid-19: Secondary | ICD-10-CM | POA: Diagnosis not present

## 2020-10-06 HISTORY — PX: CARDIOVERSION: SHX1299

## 2020-10-06 HISTORY — PX: TEE WITHOUT CARDIOVERSION: SHX5443

## 2020-10-06 LAB — PROTIME-INR
INR: 1.2 (ref 0.8–1.2)
Prothrombin Time: 15 seconds (ref 11.4–15.2)

## 2020-10-06 LAB — BASIC METABOLIC PANEL
Anion gap: 7 (ref 5–15)
BUN: 10 mg/dL (ref 8–23)
CO2: 27 mmol/L (ref 22–32)
Calcium: 8.6 mg/dL — ABNORMAL LOW (ref 8.9–10.3)
Chloride: 104 mmol/L (ref 98–111)
Creatinine, Ser: 0.84 mg/dL (ref 0.61–1.24)
GFR, Estimated: >60 mL/min (ref >60)
Glucose, Bld: 102 mg/dL — ABNORMAL HIGH (ref 70–99)
Potassium: 4 mmol/L (ref 3.5–5.1)
Sodium: 138 mmol/L (ref 135–145)

## 2020-10-06 LAB — CBC
HCT: 40.2 % (ref 39.0–52.0)
Hemoglobin: 13.1 g/dL (ref 13.0–17.0)
MCH: 30.5 pg (ref 26.0–34.0)
MCHC: 32.6 g/dL (ref 30.0–36.0)
MCV: 93.5 fL (ref 80.0–100.0)
Platelets: 301 10*3/uL (ref 150–400)
RBC: 4.3 MIL/uL (ref 4.22–5.81)
RDW: 14 % (ref 11.5–15.5)
WBC: 10.2 10*3/uL (ref 4.0–10.5)
nRBC: 0 % (ref 0.0–0.2)

## 2020-10-06 SURGERY — ECHOCARDIOGRAM, TRANSESOPHAGEAL
Anesthesia: General

## 2020-10-06 MED ORDER — METOPROLOL TARTRATE 25 MG PO TABS: 12.5000 mg | ORAL_TABLET | Freq: Three times a day (TID) | ORAL | 11 refills | Status: DC

## 2020-10-06 MED ORDER — APIXABAN 5 MG PO TABS: 5.0000 mg | ORAL_TABLET | Freq: Two times a day (BID) | ORAL | 11 refills | Status: DC

## 2020-10-06 MED ORDER — PROPOFOL 10 MG/ML IV BOLUS
INTRAVENOUS | Status: DC | PRN
  Administered 2020-10-06: 50 mg via INTRAVENOUS

## 2020-10-06 MED ORDER — LIDOCAINE 2% (20 MG/ML) 5 ML SYRINGE
INTRAMUSCULAR | Status: DC | PRN
  Administered 2020-10-06: 100 mg via INTRAVENOUS

## 2020-10-06 MED ORDER — DILTIAZEM HCL ER COATED BEADS 120 MG PO CP24: 120.0000 mg | ORAL_CAPSULE | Freq: Two times a day (BID) | ORAL | 11 refills | Status: DC

## 2020-10-06 MED ORDER — SODIUM CHLORIDE 0.9 % IV SOLN: INTRAVENOUS | Status: DC

## 2020-10-06 MED ORDER — SODIUM CHLORIDE 0.9 % IV SOLN: INTRAVENOUS | Status: DC | PRN

## 2020-10-06 MED ORDER — DILTIAZEM HCL ER COATED BEADS 120 MG PO CP24
120.0000 mg | ORAL_CAPSULE | Freq: Two times a day (BID) | ORAL | Status: DC
  Administered 2020-10-06: 120 mg via ORAL
  Filled 2020-10-06: qty 1

## 2020-10-06 MED ORDER — PROPOFOL 500 MG/50ML IV EMUL
INTRAVENOUS | Status: DC | PRN
  Administered 2020-10-06: 80 ug/kg/min via INTRAVENOUS

## 2020-10-06 NOTE — Anesthesia Procedure Notes (Signed)
Procedure Name: MAC Date/Time: 10/06/2020 11:38 AM Performed by: Trinna Post., CRNA Pre-anesthesia Checklist: Patient identified, Emergency Drugs available, Suction available, Patient being monitored and Timeout performed Patient Re-evaluated:Patient Re-evaluated prior to induction Oxygen Delivery Method: Nasal cannula Placement Confirmation: positive ETCO2

## 2020-10-06 NOTE — Discharge Summary (Addendum)
Discharge Summary    Patient ID: Peter Mcmillan MRN: [id number]; DOB: [birth year]  Admit date: 10/05/2020 Discharge date: 10/06/2020  PCP:  Lois Huxley, PA   [facility] Providers Cardiologist:  Freada Bergeron, MD        Discharge Diagnoses    Active Problems:   Atrial flutter with rapid ventricular response [facility])    Diagnostic Studies/Procedures    TEE/DCCV: FINDINGS:   No LAA thrombus     CARDIOVERSION:      Indications:  Symptomatic Atrial Flutter   Procedure Details:   Once the TEE was complete, the patient had the defibrillator pads placed in the anterior and posterior position. Once an appropriate level of sedation was confirmed, the patient was cardioverted x 1 with 100J of biphasic synchronized energy.  The patient converted to NSR with rate 70s.  There were no apparent complications.  The patient had normal neuro status and respiratory status post procedure with vitals stable as recorded elsewhere.  Adequate airway was maintained throughout and vital signs monitored per protocol. _____________   History of Present Illness     Peter Mcmillan is a 65 y.o. male with history of paroxysmal aflutter not currently on [facility] and tobacco abuse was admitted 07/05 w/ atrial fib, RVR  Hospital Course     Consultants: none    The Eliquis was discontinued 11/2015 because he had no recurrence after his cardioversion in 10/2015.   He woke up on the day of admission feeling his heart race and had a syncopal episode.  He came to the hospital where he was in atrial flutter RVR with a heart rate greater than 150.  He was given IV diltiazem bolus and put on a drip.  His heart rate improved but was not well controlled and went up greater than 120 anytime he got up out of bed.  He was evaluated by Dr. Johney Frame and it was felt that he would benefit from a TEE cardioversion.  He was started on Eliquis.  He was taken to the lab for TEE/cardioversion on 7/06.  The  cardioversion was successful and he tolerated the procedure well.  He is to resume home diltiazem 120 mg twice daily.  Metoprolol has been added to his medication regimen as well.  He has been started on Eliquis 5 mg twice daily.  The office has been contacted and will arrange a follow-up appointment with Dr. Lovena Le.  If he has further recurrences, he will need to wear a monitor.  However, at this time, the syncope is felt to be the result of A.  Flutter RVR and a monitor is not needed.  He had a brief episode of a rapid heartbeat post cardioversion, but is generally maintaining sinus rhythm.  No further inpatient work-up is indicated and he is considered stable for discharge, to follow-up as an outpatient.   Did the patient have an acute coronary syndrome (MI, NSTEMI, STEMI, etc) this admission?:  No                               Did the patient have a percutaneous coronary intervention (stent / angioplasty)?:  No.       _____________  Discharge Vitals Blood pressure 112/90, pulse 86, temperature 98.6 F (37 C), temperature source Axillary, resp. rate 16, height 6\' 5"  (1.956 m), weight 97.5 kg, SpO2 100 %.  Filed Weights   10/05/20 0549 10/06/20 0459 10/06/20 1040  Weight: 102  kg 97.5 kg 97.5 kg    Labs & Radiologic Studies    CBC Recent Labs    10/05/20 0554 10/06/20 0341  WBC 9.1 10.2  HGB 13.7 13.1  HCT 41.6 40.2  MCV 93.7 93.5  PLT 304 144   Basic Metabolic Panel Recent Labs    10/05/20 0554 10/06/20 0341  NA 139 138  K 3.6 4.0  CL 106 104  CO2 26 27  GLUCOSE 109* 102*  BUN 13 10  CREATININE 0.80 0.84  CALCIUM 8.3* 8.6*  MG 1.9  --    Liver Function Tests No results for input(s): AST, ALT, ALKPHOS, BILITOT, PROT, ALBUMIN in the last 72 hours. No results for input(s): LIPASE, AMYLASE in the last 72 hours. High Sensitivity Troponin:   No results for input(s): TROPONINIHS in the last 720 hours.  BNP Invalid input(s): POCBNP D-Dimer No results for  input(s): DDIMER in the last 72 hours. Hemoglobin A1C No results for input(s): HGBA1C in the last 72 hours. Fasting Lipid Panel Recent Labs    10/05/20 0844  CHOL 137  HDL 54  LDLCALC 76  TRIG 34  CHOLHDL 2.5   Thyroid Function Tests Recent Labs    10/05/20 0844  TSH 1.917   _____________  CT Head Wo Contrast  Result Date: 10/05/2020 CLINICAL DATA:  Minor head trauma with normal mental status. Fainting EXAM: CT HEAD WITHOUT CONTRAST TECHNIQUE: Contiguous axial images were obtained from the base of the skull through the vertex without intravenous contrast. COMPARISON:  None. FINDINGS: Brain: No evidence of acute infarction, hemorrhage, hydrocephalus, extra-axial collection or mass lesion/mass effect. Vascular: No hyperdense vessel or unexpected calcification. Skull: Normal. Negative for fracture or focal lesion. Sinuses/Orbits: No acute finding. IMPRESSION: Negative head CT. Electronically Signed   By: Monte Fantasia M.D.   On: 10/05/2020 06:21   DG Chest Port 1 View  Result Date: 10/05/2020 CLINICAL DATA:  65 year old male with history of atrial flutter. Syncopal episode. EXAM: PORTABLE CHEST 1 VIEW COMPARISON:  Chest x-ray 07/31/2020. FINDINGS: Transcutaneous defibrillator pads project over the left hemithorax. Lung volumes are normal. No consolidative airspace disease. No pleural effusions. No pneumothorax. No pulmonary nodule or mass noted. Pulmonary vasculature and the cardiomediastinal silhouette are within normal limits. Old healed right-sided rib fractures are noted. IMPRESSION: No radiographic evidence of acute cardiopulmonary disease. Electronically Signed   By: Vinnie Langton M.D.   On: 10/05/2020 06:15   Disposition   Pt is being discharged home today in good condition.  Follow-up Plans & Appointments     Follow-up Information     Evans Lance, MD Follow up.   Specialty: Cardiology Why: The office will call. Contact us if you have not heard anything by  Monday. Contact information: [address] [address] [phone]                Discharge Instructions     Amb referral to AFIB Clinic   Complete by: As directed    Diet - low sodium heart healthy   Complete by: As directed    Increase activity slowly   Complete by: As directed        Discharge Medications   Allergies as of 10/06/2020       Reactions   Codeine Other (See Comments)   agitation   Other Hives, Swelling, Other (See Comments)   make feel weird; hives/swelling   Penicillin G Other (See Comments)   Penicillins Itching, Swelling, Rash  Medication List     TAKE these medications    apixaban 5 MG Tabs tablet Commonly known as: ELIQUIS Take 1 tablet (5 mg total) by mouth 2 (two) times daily.   cholecalciferol 25 MCG (1000 UNIT) tablet Commonly known as: VITAMIN D3 Take 1,000 Units by mouth daily.   diltiazem 120 MG 24 hr capsule Commonly known as: CARDIZEM CD Take 1 capsule (120 mg total) by mouth 2 (two) times daily. What changed: additional instructions   folic acid 1 MG tablet Commonly known as: FOLVITE Take 1 tablet (1 mg total) by mouth daily.   loratadine 10 MG tablet Commonly known as: CLARITIN Take 10 mg by mouth as needed for allergies.   metoprolol tartrate 25 MG tablet Commonly known as: LOPRESSOR Take 0.5 tablets (12.5 mg total) by mouth 3 (three) times daily.   omeprazole 40 MG capsule Commonly known as: PRILOSEC Take 40 mg by mouth daily.   predniSONE 10 MG tablet Commonly known as: DELTASONE Take 2 tablets (20 mg total) by mouth daily.       ASK your doctor about these medications    methotrexate 2.5 MG tablet Commonly known as: RHEUMATREX Take 6 tablets (15 mg total) by mouth once a week. Caution:Chemotherapy. Protect from light.           Outstanding Labs/Studies   None  Duration of Discharge Encounter   Greater than 30 minutes including physician  time.  Signed, Rosaria Ferries, PA-C 10/06/2020, 4:15 PM   Patient seen and examined and agree with Rosaria Ferries, PA-C as detailed above. Please see progress note from today.  Gwyndolyn Kaufman, MD

## 2020-10-06 NOTE — Progress Notes (Addendum)
Progress Note  Patient Name: Peter Mcmillan Date of Encounter: 10/06/2020  [facility] Cardiologist: Freada Bergeron, MD   Subjective   Feels ok, no presyncope or syncope but has not really been OOB Wonders why this happened, no ETOH or drugs, no recent OTC meds RA sx have been worse since COVID 03/2020, has been on steroids  Inpatient Medications    Scheduled Meds:  apixaban  5 mg Oral BID   folic acid  1 mg Oral Daily   metoprolol tartrate  12.5 mg Oral TID   predniSONE  20 mg Oral Daily   Continuous Infusions:  sodium chloride 20 mL/hr at 10/06/20 0440   diltiazem (CARDIZEM) infusion 10 mg/hr (10/06/20 0800)   PRN Meds: acetaminophen, ondansetron (ZOFRAN) IV   Vital Signs    Vitals:   10/05/20 1700 10/05/20 2100 10/06/20 0459 10/06/20 0500  BP: 126/80 115/79  (!) 103/58  Pulse:  99  (!) 101  Resp:  18  18  Temp:  97.7 F (36.5 C)  98 F (36.7 C)  TempSrc:  Oral  Oral  SpO2:  97%  98%  Weight:   97.5 kg   Height:        Intake/Output Summary (Last 24 hours) at 10/06/2020 0814 Last data filed at 10/06/2020 0656 Gross per 24 hour  Intake 525.07 ml  Output 900 ml  Net -374.93 ml   Last 3 Weights 10/06/2020 10/05/2020 09/23/2020  Weight (lbs) 214 lb 14.4 oz 224 lb 13.9 oz 225 lb  Weight (kg) 97.478 kg 102 kg 102.059 kg      Telemetry    Atrial flutter, rapid rate - Personally Reviewed  ECG    None today - Personally Reviewed  Physical Exam   GEN: No acute distress.   Neck: No JVD Cardiac: rapid and irreg R&R, no murmurs, rubs, or gallops.  Respiratory: Clear to auscultation bilaterally. GI: Soft, nontender, non-distended  MS: No edema; No deformity. Neuro:  Nonfocal  Psych: Normal affect   Labs    High Sensitivity Troponin:  No results for input(s): TROPONINIHS in the last 720 hours.    Chemistry Recent Labs  Lab 10/05/20 0554 10/06/20 0341  NA 139 138  K 3.6 4.0  CL 106 104  CO2 26 27  GLUCOSE 109* 102*  BUN 13 10  CREATININE  0.80 0.84  CALCIUM 8.3* 8.6*  GFRNONAA >60 >60  ANIONGAP 7 7     Hematology Recent Labs  Lab 10/05/20 0554 10/06/20 0341  WBC 9.1 10.2  RBC 4.44 4.30  HGB 13.7 13.1  HCT 41.6 40.2  MCV 93.7 93.5  MCH 30.9 30.5  MCHC 32.9 32.6  RDW 13.8 14.0  PLT 304 301   Lab Results  Component Value Date   TSH 1.917 10/05/2020   Lab Results  Component Value Date   CHOL 137 10/05/2020   HDL 54 10/05/2020   LDLCALC 76 10/05/2020   TRIG 34 10/05/2020   CHOLHDL 2.5 10/05/2020   No results found for: HGBA1C  BNPNo results for input(s): BNP, PROBNP in the last 168 hours.   DDimer No results for input(s): DDIMER in the last 168 hours.   Radiology    CT Head Wo Contrast  Result Date: 10/05/2020 CLINICAL DATA:  Minor head trauma with normal mental status. Fainting EXAM: CT HEAD WITHOUT CONTRAST TECHNIQUE: Contiguous axial images were obtained from the base of the skull through the vertex without intravenous contrast. COMPARISON:  None. FINDINGS: Brain: No evidence of acute infarction,  hemorrhage, hydrocephalus, extra-axial collection or mass lesion/mass effect. Vascular: No hyperdense vessel or unexpected calcification. Skull: Normal. Negative for fracture or focal lesion. Sinuses/Orbits: No acute finding. IMPRESSION: Negative head CT. Electronically Signed   By: Monte Fantasia M.D.   On: 10/05/2020 06:21   DG Chest Port 1 View  Result Date: 10/05/2020 CLINICAL DATA:  65 year old male with history of atrial flutter. Syncopal episode. EXAM: PORTABLE CHEST 1 VIEW COMPARISON:  Chest x-ray 07/31/2020. FINDINGS: Transcutaneous defibrillator pads project over the left hemithorax. Lung volumes are normal. No consolidative airspace disease. No pleural effusions. No pneumothorax. No pulmonary nodule or mass noted. Pulmonary vasculature and the cardiomediastinal silhouette are within normal limits. Old healed right-sided rib fractures are noted. IMPRESSION: No radiographic evidence of acute  cardiopulmonary disease. Electronically Signed   By: Vinnie Langton M.D.   On: 10/05/2020 06:15    Cardiac Studies   TEE scheduled for 3 pm  Patient Profile     65 y.o. male  with history of paroxysmal aflutter not currently on [facility] and tobacco abuse was admitted 07/05 w/ atrial fib, RVR  Assessment & Plan    Atrial flutter, RVR - taken off Eliquis remotely since no known recurrence after DCCV 2017 - BP tolerating IV Cardizem at 10 mg/hr but HR still not controlled - for TEE/DCCV today at 2 pm - F/U with EP as outpt to discuss ablation - continue Eliquis for CHA2DS2-VASc = 1, vascular dz  2. Syncope - felt 2nd Atrial flutter RVR - MD advise if pt needs to wear outpt monitor  For questions or updates, please contact [place] Please consult [email or web address] for contact info under        Signed, Rosaria Ferries, PA-C  10/06/2020, 8:14 AM    Patient seen and examined and agree with Rosaria Ferries, PA-C as detailed above.  In brief, the patient is a 65 y.o. male with history of paroxysmal aflutter not currently on [facility] and tobacco abuse who is being seen 10/05/2020 for the evaluation of syncope in the setting of recurrent Afib/flutter with RVR.  Underwent successful TEE/DCCV today. Doing much better. Will plan to discharge home on apixaban and metoprolol and planned follow-up with Cardiology.  GEN: No acute distress.   Neck: No JVD Cardiac: RRR, no murmurs, rubs, or gallops.  Respiratory: Clear to auscultation bilaterally. GI: Soft, nontender, non-distended  MS: No edema; No deformity. Neuro:  Nonfocal  Psych: Normal affect    Plan: -S/p successful TEE/DCCV today -Continue apixaban 5mg  BID  -Resume home dilt 120mg  BID  -Will arrange for CV follow-up; likely see EP (Dr. Lovena Le) to discuss possibility of flutter ablation -No need for monitor as syncope likely from [place] with RVR; will arrange close follow-up and if recurrent symptoms, will place monitor at that time    Gwyndolyn Kaufman, MD

## 2020-10-06 NOTE — CV Procedure (Signed)
   TRANSESOPHAGEAL ECHOCARDIOGRAM GUIDED DIRECT CURRENT CARDIOVERSION  NAME:  Peter Mcmillan   MRN: [id number] DOB:  04-May-1955   ADMIT DATE: 10/05/2020  INDICATIONS: Symptomatic atrial flutter  PROCEDURE:   Informed consent was obtained prior to the procedure. The risks, benefits and alternatives for the procedure were discussed and the patient comprehended these risks.  Risks include, but are not limited to, cough, sore throat, vomiting, nausea, somnolence, esophageal and stomach trauma or perforation, bleeding, low blood pressure, aspiration, pneumonia, infection, trauma to the teeth and death.    After a procedural time-out, the oropharynx was anesthetized and the patient was sedated by the anesthesia service. The transesophageal probe was inserted in the esophagus and stomach without difficulty and multiple views were obtained. Anesthesia was monitored by Dr. Lanetta Inch and Dewitt Hoes, CRNA.   COMPLICATIONS:    Complications: No complications Patient tolerated procedure well.  FINDINGS:  No LAA thrombus   CARDIOVERSION:     Indications:  Symptomatic Atrial Flutter  Procedure Details:  Once the TEE was complete, the patient had the defibrillator pads placed in the anterior and posterior position. Once an appropriate level of sedation was confirmed, the patient was cardioverted x 1 with 100J of biphasic synchronized energy.  The patient converted to NSR with rate 70s.  There were no apparent complications.  The patient had normal neuro status and respiratory status post procedure with vitals stable as recorded elsewhere.  Adequate airway was maintained throughout and vital signs monitored per protocol.  Oswaldo Milian MD [facility]  [address], [place], [place] [address] [phone]   12:00 PM

## 2020-10-06 NOTE — Plan of Care (Signed)

## 2020-10-06 NOTE — Interval H&P Note (Signed)
History and Physical Interval Note:  10/06/2020 11:20 AM  Peter Mcmillan  has presented today for surgery, with the diagnosis of A-FLUTTER.  The various methods of treatment have been discussed with the patient and family. After consideration of risks, benefits and other options for treatment, the patient has consented to  Procedure(s): TRANSESOPHAGEAL ECHOCARDIOGRAM (TEE) (N/A) CARDIOVERSION (N/A) as a surgical intervention.  The patient's history has been reviewed, patient examined, no change in status, stable for surgery.  I have reviewed the patient's chart and labs.  Questions were answered to the patient's satisfaction.     Donato Heinz

## 2020-10-06 NOTE — Transfer of Care (Signed)
Immediate Anesthesia Transfer of Care Note  Patient: Peter Mcmillan  Procedure(s) Performed: TRANSESOPHAGEAL ECHOCARDIOGRAM (TEE) CARDIOVERSION  Patient Location: Endoscopy Unit  Anesthesia Type:General  Level of Consciousness: drowsy  Airway & Oxygen Therapy: Patient Spontanous Breathing and Patient connected to nasal cannula oxygen  Post-op Assessment: Report given to RN and Post -op Vital signs reviewed and stable  Post vital signs: Reviewed and stable  Last Vitals:  Vitals Value Taken Time  BP 99/66 10/06/20 1204  Temp    Pulse 80 10/06/20 1206  Resp 22 10/06/20 1206  SpO2 91 % 10/06/20 1206  Vitals shown include unvalidated device data.  Last Pain:  Vitals:   10/06/20 1040  TempSrc: Temporal  PainSc: 4       Patients Stated Pain Goal: 0 (76/15/18 3437)  Complications: No notable events documented.

## 2020-10-06 NOTE — Anesthesia Preprocedure Evaluation (Addendum)
Anesthesia Evaluation  Patient identified by MRN, date of birth, ID band Patient awake    Reviewed: Allergy & Precautions, NPO status , Patient's Chart, lab work & pertinent test results  Airway Mallampati: II  TM Distance: >3 FB Neck ROM: Full    Dental no notable dental hx. (+) Teeth Intact, Dental Advisory Given   Pulmonary neg pulmonary ROS,    Pulmonary exam normal breath sounds clear to auscultation       Cardiovascular hypertension, Pt. on medications Normal cardiovascular exam+ dysrhythmias Atrial Fibrillation  Rhythm:Regular Rate:Normal  TTE 2015 NORMAL LEFT VENTRICULAR SYSTOLIC FUNCTION  NORMAL RIGHT VENTRICULAR SYSTOLIC FUNCTION  VALVULAR REGURGITATION: TRIVIAL MR, TRIVIAL PR, TRIVIAL TR  NO VALVULAR STENOSIS  NO PRIOR STUDY FOR COMPARISON     Neuro/Psych negative neurological ROS  negative psych ROS   GI/Hepatic Neg liver ROS, GERD  Medicated and Controlled,  Endo/Other  negative endocrine ROS  Renal/GU negative Renal ROS  negative genitourinary   Musculoskeletal  (+) Arthritis , Rheumatoid disorders,    Abdominal   Peds  Hematology negative hematology ROS (+)   Anesthesia Other Findings 65 y.o. male with history of paroxysmal aflutter not currently on [facility] and tobacco abuse who is being seen 10/05/2020 for the evaluation of syncope in the setting of recurrent Afib/flutter with RVR. Currently on diltiazem gtt.   Reproductive/Obstetrics                            Anesthesia Physical Anesthesia Plan  ASA: 3  Anesthesia Plan: General   Post-op Pain Management:    Induction: Intravenous  PONV Risk Score and Plan: 2 and Propofol infusion and Treatment may vary due to age or medical condition  Airway Management Planned: Natural Airway  Additional Equipment:   Intra-op Plan:   Post-operative Plan:   Informed Consent: I have reviewed the patients History and  Physical, chart, labs and discussed the procedure including the risks, benefits and alternatives for the proposed anesthesia with the patient or authorized representative who has indicated his/her understanding and acceptance.     Dental advisory given  Plan Discussed with: CRNA  Anesthesia Plan Comments:         Anesthesia Quick Evaluation

## 2020-10-06 NOTE — Anesthesia Postprocedure Evaluation (Signed)
Anesthesia Post Note  Patient: Ryzen Deady  Procedure(s) Performed: TRANSESOPHAGEAL ECHOCARDIOGRAM (TEE) CARDIOVERSION     Patient location during evaluation: Endoscopy Anesthesia Type: General Level of consciousness: awake and alert Pain management: pain level controlled Vital Signs Assessment: post-procedure vital signs reviewed and stable Respiratory status: spontaneous breathing, nonlabored ventilation, respiratory function stable and patient connected to nasal cannula oxygen Cardiovascular status: blood pressure returned to baseline and stable Postop Assessment: no apparent nausea or vomiting Anesthetic complications: no   No notable events documented.  Last Vitals:  Vitals:   10/06/20 1450 10/06/20 1519  BP: 128/80 112/90  Pulse:  86  Resp:  16  Temp:  37 C  SpO2:  100%    Last Pain:  Vitals:   10/06/20 1519  TempSrc: Axillary  PainSc:                  Kaylianna Detert L Gwynn Crossley

## 2020-10-18 ENCOUNTER — Telehealth: Payer: Self-pay

## 2020-10-18 DIAGNOSIS — M059 Rheumatoid arthritis with rheumatoid factor, unspecified: Secondary | ICD-10-CM

## 2020-10-18 DIAGNOSIS — Z79899 Other long term (current) drug therapy: Secondary | ICD-10-CM

## 2020-10-18 NOTE — Telephone Encounter (Signed)
Spoke with patient, advised his baseline labs checked for starting Orencia for RA showed an indeterminate tuberculosis test. This is probably due to his current medication. It is recommended to repeat this after a few weeks to make sure it is not actually positive. He could do so now or any time in the future that works for him to come have it drawn.  Patient plans to come this week to have labs drawn, future lab order had been placed for QuantiFERON-TB Gold Plus.

## 2020-10-18 NOTE — Telephone Encounter (Signed)
Patient states at his last appointment with Dr. Benjamine Mola they discussed starting a new medication.  Patient requested a return call to let him know when he needs to schedule a follow-up appointment.

## 2020-10-18 NOTE — Telephone Encounter (Signed)
His baseline labs checked for starting Orencia for RA showed an indeterminate tuberculosis test. This is probably due to his current medication. It is recommended to repeat this after a few weeks to make sure it is not actually positive. He could do so now or any time in the future that works for him to come have it drawn.

## 2020-10-20 ENCOUNTER — Other Ambulatory Visit: Payer: Self-pay

## 2020-10-20 DIAGNOSIS — M059 Rheumatoid arthritis with rheumatoid factor, unspecified: Secondary | ICD-10-CM

## 2020-10-20 DIAGNOSIS — Z79899 Other long term (current) drug therapy: Secondary | ICD-10-CM

## 2020-10-23 LAB — QUANTIFERON-TB GOLD PLUS
Mitogen-NIL: 1.78 IU/mL
NIL: 0.03 IU/mL
QuantiFERON-TB Gold Plus: NEGATIVE
TB1-NIL: 0 IU/mL
TB2-NIL: 0.01 IU/mL

## 2020-10-25 ENCOUNTER — Telehealth: Payer: Self-pay | Admitting: Radiology

## 2020-10-25 NOTE — Telephone Encounter (Signed)
FYI- I spoke with Peter Mcmillan his repeat quantiferon is negative. Recommending starting orencia for RA he agrees with this plan after we have reviewed medication administration and risks.

## 2020-10-25 NOTE — Telephone Encounter (Signed)
Received voicemail from patient asking for lab results, collected on 10/20/2020 and how to proceed with starting new medication.

## 2020-10-26 ENCOUNTER — Telehealth: Payer: Self-pay | Admitting: Pharmacist

## 2020-10-26 NOTE — Telephone Encounter (Addendum)
Please start Orencia auto-injector BIV. Dose: '125mg'$  SQ every 7 days   See notes below for treatment history and specific considerations ruling out all of the following: Humira, Enbrel, Simponi, Cimzia, Rinvoq, Olumiant, and Morrie Sheldon  ----- Message from Collier Salina, MD sent at 10/25/2020  9:14 PM EDT ----- Regarding: New Orencia start Recommending new start [place] orencia treatment for Peter Mcmillan for RA. He has inadequate response to methotrexate monotherapy. He has extensive history of skin cancers seen with dermatology clinic so do not recommend starting TNF as next treatment. He has cardiovascular risk factors so do not recommend JAK inhibitor as next treatment.

## 2020-10-26 NOTE — Telephone Encounter (Signed)
Submitted a Prior Authorization request to [employer] for [facility] via CoverMyMeds. Will update once we receive a response.   Key: [id number]

## 2020-11-01 ENCOUNTER — Encounter: Payer: Self-pay | Admitting: Student

## 2020-11-01 ENCOUNTER — Ambulatory Visit (INDEPENDENT_AMBULATORY_CARE_PROVIDER_SITE_OTHER): Payer: Commercial Managed Care - PPO | Admitting: Student

## 2020-11-01 ENCOUNTER — Other Ambulatory Visit: Payer: Self-pay

## 2020-11-01 ENCOUNTER — Telehealth: Payer: Self-pay | Admitting: Internal Medicine

## 2020-11-01 ENCOUNTER — Encounter (HOSPITAL_COMMUNITY): Payer: Self-pay

## 2020-11-01 ENCOUNTER — Observation Stay (HOSPITAL_COMMUNITY)
Admission: EM | Admit: 2020-11-01 | Discharge: 2020-11-04 | Disposition: A | Discharge status: Home / Self Care | Payer: Commercial Managed Care - PPO | Attending: Cardiology | Admitting: Cardiology

## 2020-11-01 ENCOUNTER — Emergency Department (HOSPITAL_COMMUNITY): Payer: Commercial Managed Care - PPO

## 2020-11-01 VITALS — BP 128/86 | HR 172 | Ht 77.0 in | Wt 235.0 lb

## 2020-11-01 DIAGNOSIS — I4892 Unspecified atrial flutter: Secondary | ICD-10-CM | POA: Diagnosis not present

## 2020-11-01 DIAGNOSIS — I10 Essential (primary) hypertension: Secondary | ICD-10-CM | POA: Insufficient documentation

## 2020-11-01 DIAGNOSIS — R0602 Shortness of breath: Secondary | ICD-10-CM | POA: Diagnosis present

## 2020-11-01 DIAGNOSIS — R7303 Prediabetes: Secondary | ICD-10-CM | POA: Insufficient documentation

## 2020-11-01 DIAGNOSIS — Z79899 Other long term (current) drug therapy: Secondary | ICD-10-CM | POA: Diagnosis not present

## 2020-11-01 DIAGNOSIS — I5189 Other ill-defined heart diseases: Secondary | ICD-10-CM | POA: Diagnosis not present

## 2020-11-01 DIAGNOSIS — Z20822 Contact with and (suspected) exposure to covid-19: Secondary | ICD-10-CM | POA: Insufficient documentation

## 2020-11-01 DIAGNOSIS — I483 Typical atrial flutter: Secondary | ICD-10-CM

## 2020-11-01 DIAGNOSIS — I480 Paroxysmal atrial fibrillation: Secondary | ICD-10-CM | POA: Diagnosis not present

## 2020-11-01 DIAGNOSIS — I5023 Acute on chronic systolic (congestive) heart failure: Secondary | ICD-10-CM

## 2020-11-01 DIAGNOSIS — Z7901 Long term (current) use of anticoagulants: Secondary | ICD-10-CM | POA: Insufficient documentation

## 2020-11-01 DIAGNOSIS — I4891 Unspecified atrial fibrillation: Secondary | ICD-10-CM | POA: Diagnosis present

## 2020-11-01 LAB — CBC
HCT: 40.5 % (ref 39.0–52.0)
Hemoglobin: 13.5 g/dL (ref 13.0–17.0)
MCH: 31.6 pg (ref 26.0–34.0)
MCHC: 33.3 g/dL (ref 30.0–36.0)
MCV: 94.8 fL (ref 80.0–100.0)
Platelets: 293 10*3/uL (ref 150–400)
RBC: 4.27 MIL/uL (ref 4.22–5.81)
RDW: 14.1 % (ref 11.5–15.5)
WBC: 9.6 10*3/uL (ref 4.0–10.5)
nRBC: 0 % (ref 0.0–0.2)

## 2020-11-01 LAB — BASIC METABOLIC PANEL
Anion gap: 8 (ref 5–15)
BUN: 11 mg/dL (ref 8–23)
CO2: 26 mmol/L (ref 22–32)
Calcium: 9 mg/dL (ref 8.9–10.3)
Chloride: 104 mmol/L (ref 98–111)
Creatinine, Ser: 0.84 mg/dL (ref 0.61–1.24)
GFR, Estimated: >60 mL/min (ref >60)
Glucose, Bld: 139 mg/dL — ABNORMAL HIGH (ref 70–99)
Potassium: 4.3 mmol/L (ref 3.5–5.1)
Sodium: 138 mmol/L (ref 135–145)

## 2020-11-01 LAB — RESP PANEL BY RT-PCR (FLU A&B, COVID) ARPGX2
Influenza A by PCR: NEGATIVE
Influenza B by PCR: NEGATIVE
SARS Coronavirus 2 by RT PCR: NEGATIVE

## 2020-11-01 LAB — BRAIN NATRIURETIC PEPTIDE: B Natriuretic Peptide: 438.2 pg/mL — ABNORMAL HIGH (ref 0.0–100.0)

## 2020-11-01 LAB — TROPONIN I (HIGH SENSITIVITY)
Troponin I (High Sensitivity): 8 ng/L (ref <18)
Troponin I (High Sensitivity): 7 ng/L (ref <18)

## 2020-11-01 LAB — TSH: TSH: 0.809 u[IU]/mL (ref 0.350–4.500)

## 2020-11-01 MED ORDER — DILTIAZEM HCL 25 MG/5ML IV SOLN
25.0000 mg | Freq: Once | INTRAVENOUS | Status: AC
  Administered 2020-11-01: 25 mg via INTRAVENOUS
  Filled 2020-11-01: qty 5

## 2020-11-01 MED ORDER — ACETAMINOPHEN 325 MG PO TABS: 650.0000 mg | ORAL_TABLET | ORAL | Status: DC | PRN

## 2020-11-01 MED ORDER — APIXABAN 5 MG PO TABS
5.0000 mg | ORAL_TABLET | Freq: Two times a day (BID) | ORAL | Status: DC
  Administered 2020-11-01 – 2020-11-04 (×6): 5 mg via ORAL
  Filled 2020-11-01 (×6): qty 1

## 2020-11-01 MED ORDER — FENTANYL CITRATE (PF) 100 MCG/2ML IJ SOLN
INTRAMUSCULAR | Status: AC | PRN
  Administered 2020-11-01: 50 ug via INTRAVENOUS

## 2020-11-01 MED ORDER — FENTANYL CITRATE (PF) 100 MCG/2ML IJ SOLN
INTRAMUSCULAR | Status: AC
  Filled 2020-11-01: qty 2

## 2020-11-01 MED ORDER — METOPROLOL TARTRATE 25 MG PO TABS
25.0000 mg | ORAL_TABLET | Freq: Two times a day (BID) | ORAL | Status: DC
  Administered 2020-11-01 – 2020-11-04 (×6): 25 mg via ORAL
  Filled 2020-11-01 (×6): qty 1

## 2020-11-01 MED ORDER — FUROSEMIDE 10 MG/ML IJ SOLN
40.0000 mg | Freq: Two times a day (BID) | INTRAMUSCULAR | Status: DC
  Administered 2020-11-01: 40 mg via INTRAVENOUS
  Filled 2020-11-01: qty 4

## 2020-11-01 MED ORDER — ETOMIDATE 2 MG/ML IV SOLN
10.0000 mg | Freq: Once | INTRAVENOUS | Status: DC
  Filled 2020-11-01: qty 10

## 2020-11-01 MED ORDER — ONDANSETRON HCL 4 MG/2ML IJ SOLN
4.0000 mg | Freq: Four times a day (QID) | INTRAMUSCULAR | Status: DC | PRN
  Filled 2020-11-01: qty 2

## 2020-11-01 MED ORDER — ETOMIDATE 2 MG/ML IV SOLN
INTRAVENOUS | Status: AC | PRN
  Administered 2020-11-01: 10 mg via INTRAVENOUS

## 2020-11-01 MED ORDER — FENTANYL CITRATE (PF) 100 MCG/2ML IJ SOLN: 50.0000 ug | Freq: Once | INTRAMUSCULAR | Status: DC

## 2020-11-01 MED ORDER — NITROGLYCERIN 0.4 MG SL SUBL: 0.4000 mg | SUBLINGUAL_TABLET | SUBLINGUAL | Status: DC | PRN

## 2020-11-01 MED ORDER — PREDNISONE 20 MG PO TABS
20.0000 mg | ORAL_TABLET | Freq: Every day | ORAL | Status: DC
  Administered 2020-11-02 – 2020-11-04 (×3): 20 mg via ORAL
  Filled 2020-11-01 (×3): qty 1

## 2020-11-01 NOTE — Telephone Encounter (Signed)
Pt c/o Shortness Of Breath: STAT if SOB developed within the last 24 hours or pt is noticeably SOB on the phone  1. Are you currently SOB (can you hear that pt is SOB on the phone)? Yes, yes can hear   2. How long have you been experiencing SOB? 2 to 3 days  3. Are you SOB when sitting or when up moving around? Mostly when moving around  4. Are you currently experiencing any other symptoms? Swelling   Pt c/o swelling: STAT is pt has developed SOB within 24 hours  If swelling, where is the swelling located? From the knees down and feet, hands  How much weight have you gained and in what time span? About 5 more  Have you gained 3 pounds in a day or 5 pounds in a week? Yes   Do you have a log of your daily weights (if so, list)? 230 this morning normal weight 219 to 220  Are you currently taking a fluid pill? No   Are you currently SOB? Yes   Have you traveled recently? No     Pt c/o medication issue: 1. Name of Medication: Eliquis 2. How are you currently taking this medication (dosage and times per day)? BID 3. Are you having a reaction (difficulty breathing--STAT)?  SOB 4. What is your medication issue? Swelling and SOB

## 2020-11-01 NOTE — Telephone Encounter (Signed)
Spoke with pt who is complaining of weight increase greater than 5lbs over the last week.  Current weight is 230.  Pt reports LEE that does improve at night and after elevation, SOB on exertion.  Denies CP, severe SOB, dizziness/ fainting at this time.  Pt states he feels his heart racing but has not checked his HR.  He feels like he is out of rhythm again.  Pt had DCCV on 10/06/2020.  He states he is on a low sodium diet and has been elevating feet and legs when sitting.  Pt taking medications as prescribed.  Pt is not on diuretic. Pt scheduled to see Dr Lovena Le 11/12/2020 to discuss ablation.  Appointment scheduled for 11/01/2020 with Oda Kilts, PA-C for further evaluation and recommendation.  Pt verbalizes understanding and agrees with current plan.

## 2020-11-01 NOTE — Progress Notes (Signed)
PCP:  Lois Huxley, PA Primary Cardiologist: Freada Bergeron, MD Electrophysiologist: Cristopher Peru, MD   Peter Mcmillan is a 65 y.o. male seen today for Cristopher Peru, MD for acute visit due to recurrent AFL with RVR .  Previously was on Eliquis which was ultimately stopped due to low CHA2DS2-VASc of 1 (will be two next month).   Admitted 7/5 - 7/6 with Atrial flutter(?) vs coarse AF with RVR.  Day of admission he had syncopal vs near syncopal episode. On arrival found to have HR in 150s and started on IV dilt. HR controlled at rest but elevated with exerxtion. His Eliquis was resumed and underwent TEE/DCC on 7/6 with return to NSR. Discharged on diltiazem and metoprolo.   Pt states he felt OK for about 2-3 weeks after [facility] 10/06/2020. Last week he noticed his HR was elevated again and he has gained about 5-7 lbs with edema half way to his knees over the weekend. No chest pain. + fatigue and tachy-palpitations. + Bendopnea. He stopped metoprolol over the weekend after reading side effects of "fatigue and peripheral edema".  He has had no further syncope/near syncope. He is SOB with minimal exertion and with bending over.   TEE 10/06/2020 LVEF 40-45% in setting of RVR  Past Medical History:  Diagnosis Date   Atrial flutter ([place])    Erectile dysfunction    Rheumatoid arthritis ([place])    Tobacco abuse    Smokeless tobacco   Past Surgical History:  Procedure Laterality Date   BACK SURGERY  1999   CARDIOVERSION N/A 10/06/2020   Procedure: CARDIOVERSION;  Surgeon: Donato Heinz, MD;  Location: [place];  Service: Cardiovascular;  Laterality: N/A;   MELANOMA EXCISION  2020   TEE WITHOUT CARDIOVERSION N/A 10/06/2020   Procedure: TRANSESOPHAGEAL ECHOCARDIOGRAM (TEE);  Surgeon: Donato Heinz, MD;  Location: [facility] ENDOSCOPY;  Service: Cardiovascular;  Laterality: N/A;    Current Outpatient Medications  Medication Sig Dispense Refill   apixaban (ELIQUIS) 5 MG TABS tablet  Take 1 tablet (5 mg total) by mouth 2 (two) times daily. 60 tablet 11   cholecalciferol (VITAMIN D3) 25 MCG (1000 UNIT) tablet Take 1,000 Units by mouth daily.     diltiazem (CARDIZEM CD) 120 MG 24 hr capsule Take 1 capsule (120 mg total) by mouth 2 (two) times daily. 60 capsule 11   folic acid (FOLVITE) 1 MG tablet Take 1 tablet (1 mg total) by mouth daily. 90 tablet 0   loratadine (CLARITIN) 10 MG tablet Take 10 mg by mouth as needed for allergies.     metoprolol tartrate (LOPRESSOR) 25 MG tablet Take 0.5 tablets (12.5 mg total) by mouth 3 (three) times daily. 25 tablet 11   predniSONE (DELTASONE) 10 MG tablet Take 2 tablets (20 mg total) by mouth daily. 60 tablet 0   omeprazole (PRILOSEC) 40 MG capsule Take 40 mg by mouth daily. (Patient not taking: Reported on 11/01/2020)     No current facility-administered medications for this visit.    Allergies  Allergen Reactions   Codeine Other (See Comments)    agitation   Other Hives, Swelling and Other (See Comments)    make feel weird; hives/swelling   Penicillin G Other (See Comments)   Penicillins Itching, Swelling and Rash    Social History   Socioeconomic History   Marital status: Married    Spouse name: Not on file   Number of children: 1   Years of education: Not on file   Highest  education level: Not on file  Occupational History   Occupation: Clinical biochemist  Tobacco Use   Smoking status: Never   Smokeless tobacco: Current    Types: Snuff  Vaping Use   Vaping Use: Never used  Substance and Sexual Activity   Alcohol use: Yes    Comment: Rare   Drug use: No   Sexual activity: Not on file  Other Topics Concern   Not on file  Social History Narrative   Not on file   Social Determinants of Health   Financial Resource Strain: Not on file  Food Insecurity: Not on file  Transportation Needs: Not on file  Physical Activity: Not on file  Stress: Not on file  Social Connections: Not on file  Intimate Partner Violence: Not  on file   Review of Systems: All other systems reviewed and are otherwise negative except as noted above.  Physical Exam: Vitals:   11/01/20 1010  BP: 128/86  Pulse: (!) 172  SpO2: 98%  Weight: 235 lb (106.6 kg)  Height: '6\' 5"'$  (1.956 m)    GEN- The patient is fatigued appearing, alert and oriented x 3 today.   HEENT: normocephalic, atraumatic; sclera clear, conjunctiva pink; hearing intact; oropharynx clear; neck supple, no JVP Lymph- no cervical lymphadenopathy Lungs- Somewhat increased work of breathing, especially after bending over. At least mildly diminished basilar sounds.  Heart- Rapid and irregularly irregular rate and rhythm, no murmurs, rubs or gallops, PMI not laterally displaced GI- soft, non-tender, non-distended, bowel sounds present, no hepatosplenomegaly Extremities- no clubbing or cyanosis. Large boots on, but 2+ edema at least half way to knees; DP/PT/radial pulses 2+ bilaterally MS- no significant deformity or atrophy Skin- warm and dry, no rash or lesion Psych- euthymic mood, full affect Neuro- strength and sensation are intact  EKG is ordered. Personal review of EKG from today appears to show AF with RVR at 172. By Jodelle Red, pt HRs as high as 182.  Additional studies reviewed include: Recent admission notes.  TEE 10/06/2020  Assessment and Plan:  1. Atrial flutter with RVR / ? Coarse AF S/p [facility] 10/06/2020 Today appears to be in an AF with RVR and highly symptomatic. Last seen by Dr. Lovena Le 11/2015 for Flutter. EKGs appear more likely fib this time around.  On Eliquis for CHA2DS2/VASc of 1 (but recent increase in burden). Will go to 2 next month due to age.  ? If candidate for Tikosyn. QTc in sinus appears ~ 440 ms. Renal function is WNL. CrCl 134 by most recent BMET. ? If candidate for ablation. Appears to have now had both fib and flutter.   3 LV dysfunction TEE 10/06/20 showed LVEF 40-45%, likely in setting of AF with RVR Consider changing lopressor to  Toprol and rechecking in 3-4 months after maintenance of NSR.   Pt presented to clinic in AF RVR with HRs in 170-180s with volume overload and SOB with minimal exertion. He ate a small breakfast at 0500 and states he hasn't missed any of his Eliquis. I recommended he proceed to [facility] for [facility] consideration and EP consultation. Reviewed case briefly with Dr. Burt Knack, DOD who agreed. Also gave a heads up to Tommye Standard and Dr. Quentin Ore who are in the hospital today.    Shirley Friar, PA-C  11/01/20 10:21 AM

## 2020-11-01 NOTE — Telephone Encounter (Signed)
Patient calling in reference to changing his medication. Patient states doctor was to call to discuss medication change, but he has not heard anything. Patient has not been able to get any relief. Please call to advise.

## 2020-11-01 NOTE — Telephone Encounter (Signed)
Spoke with patient, advised Mason Jim PA has been initiated.   FYI: Per patient he is currently at [facility] and going to be admitted for A.fib.

## 2020-11-01 NOTE — Patient Instructions (Signed)
PATIENT WAS ADVISED TO GO TO THE EMERGENCY DEPARTMENT  Medication Instructions:  Your physician recommends that you continue on your current medications as directed. Please refer to the Current Medication list given to you today.  *If you need a refill on your cardiac medications before your next appointment, please call your pharmacy*   Lab Work: None If you have labs (blood work) drawn today and your tests are completely normal, you will receive your results only by: [place] (if you have MyChart) OR A paper copy in the mail If you have any lab test that is abnormal or we need to change your treatment, we will call you to review the results.   Follow-Up: At [facility], you and your health needs are our priority.  As part of our continuing mission to provide you with exceptional heart care, we have created designated Provider Care Teams.  These Care Teams include your primary Cardiologist (physician) and Advanced Practice Providers (APPs -  Physician Assistants and Nurse Practitioners) who all work together to provide you with the care you need, when you need it.  Your next appointment:   Keep f/u with Dr Lovena Le as scheduled

## 2020-11-01 NOTE — ED Triage Notes (Addendum)
Pt sent here by his cardiologist, pt went today for BLE edema. Pt was in A-fib while at his cardiologist, they sent him here for a work up and to be admitted for further evaluation. Pt endorses SOB. Pt denies hx of A-Fib

## 2020-11-01 NOTE — H&P (Addendum)
Cardiology H&P   Patient ID: Peter Mcmillan MRN: [id number]; DOB: 1955/10/06  Admit date: 11/01/2020 Date of Consult: 11/01/2020  PCP:  Lois Huxley, [place] Prov[facility] Cardiologist:  Freada Bergeron, MD  Electrophysiologist:  Cristopher Peru, MD       Patient Profile:   Peter Mcmillan is a 65 y.o. male with a hx of RA, smoker, Aflutter who is being admitted 11/01/2020 with AFib w/RVR and volume OL.  History of Present Illness:   Peter Mcmillan was seen remotely by Dr. Lovena Le back in 2017 with a diagnosis of Aflutter, he back then had an ER visit with a single episode of AFlutter.  With no recurrence, was decided with low CHA2DS2Vasc score to stop a/c and watch clinically.  He had done well until recently admitted to [facility] 10/05/20 with AFib RVR, he was started on Eliquis, initially rate controlled and then underwent TEE/DCCV, discharged 10/06/20.  He did well again for a couple weeks though last week developed recurrent palpitations, racing heart rates.  After a day or so noted some increased DOE and swelling LE, in reviewing possible side effects of his medicines noted that the metoprolol could cause these and Saturday stopped it.  He has had progressive worsening of SOB and swelling. He was seen in the office this morning for this and found in rapid AFib.  With reports of compliance with his Eliquis and volume OL noted, referred to the ER for DCCV and likely admission with HF exacerbation.  He has not had any CP, though when in AFib he has a slighttigtness to his jaw, this he notes only when in AF/fast. No recurrent syncope since his last admission  LABS K+ 4.3 BUN/Creat 11/0.84 WBC 9.6 H/H 13/40 Plts 293  CXR  IMPRESSION: Bibasilar atelectasis. Possible trace bilateral pleural effusions.   Past Medical History:  Diagnosis Date   Atrial flutter ([place])    Erectile dysfunction    Rheumatoid arthritis ([place])    Tobacco abuse    Smokeless tobacco    Past  Surgical History:  Procedure Laterality Date   BACK SURGERY  1999   CARDIOVERSION N/A 10/06/2020   Procedure: CARDIOVERSION;  Surgeon: Donato Heinz, MD;  Location: [place];  Service: Cardiovascular;  Laterality: N/A;   MELANOMA EXCISION  2020   TEE WITHOUT CARDIOVERSION N/A 10/06/2020   Procedure: TRANSESOPHAGEAL ECHOCARDIOGRAM (TEE);  Surgeon: Donato Heinz, MD;  Location: [facility] ENDOSCOPY;  Service: Cardiovascular;  Laterality: N/A;     Home Medications:  Prior to Admission medications   Medication Sig Start Date End Date Taking? Authorizing Provider  apixaban (ELIQUIS) 5 MG TABS tablet Take 1 tablet (5 mg total) by mouth 2 (two) times daily. 10/06/20  Yes Barrett, Evelene Croon, PA-C  cholecalciferol (VITAMIN D3) 25 MCG (1000 UNIT) tablet Take 1,000 Units by mouth daily.   Yes [provider]  diltiazem (CARDIZEM CD) 120 MG 24 hr capsule Take 1 capsule (120 mg total) by mouth 2 (two) times daily. 10/06/20  Yes Barrett, Evelene Croon, PA-C  folic acid (FOLVITE) 1 MG tablet Take 1 tablet (1 mg total) by mouth daily. 09/23/20  Yes Rice, Resa Miner, MD  loratadine (CLARITIN) 10 MG tablet Take 10 mg by mouth as needed for allergies.   Yes [provider]  methotrexate (RHEUMATREX) 2.5 MG tablet Take 2.5 mg by mouth once a week. Take 6 tablets once a week, every Sunday.   Caution:Chemotherapy. Protect from light.   Yes [provider]  metoprolol tartrate (LOPRESSOR) 25 MG tablet Take 0.5 tablets (12.5 mg total) by mouth 3 (three) times daily. 10/06/20  Yes Barrett, Evelene Croon, PA-C  predniSONE (DELTASONE) 10 MG tablet Take 2 tablets (20 mg total) by mouth daily. 09/23/20  Yes Rice, Resa Miner, MD  omeprazole (PRILOSEC) 40 MG capsule Take 40 mg by mouth daily. Patient not taking: Reported on 11/01/2020 12/17/19   [provider]    Inpatient Medications: Scheduled Meds:  diltiazem  25 mg Intravenous Once   Continuous Infusions:  PRN  Meds:   Allergies:    Allergies  Allergen Reactions   Codeine Other (See Comments)    agitation   Other Hives, Swelling and Other (See Comments)    make feel weird; hives/swelling   Penicillin G Other (See Comments)   Penicillins Itching, Swelling and Rash    Social History:   Social History   Socioeconomic History   Marital status: Married    Spouse name: Not on file   Number of children: 1   Years of education: Not on file   Highest education level: Not on file  Occupational History   Occupation: Clinical biochemist  Tobacco Use   Smoking status: Never   Smokeless tobacco: Current    Types: Snuff  Vaping Use   Vaping Use: Never used  Substance and Sexual Activity   Alcohol use: Yes    Comment: Rare   Drug use: No   Sexual activity: Not on file  Other Topics Concern   Not on file  Social History Narrative   Not on file   Social Determinants of Health   Financial Resource Strain: Not on file  Food Insecurity: Not on file  Transportation Needs: Not on file  Physical Activity: Not on file  Stress: Not on file  Social Connections: Not on file  Intimate Partner Violence: Not on file    Family History:   Family History  Problem Relation Age of Onset   Heart attack Father    Heart attack Mother    Dementia Mother    Healthy Sister    Healthy Son      ROS:  Please see the history of present illness.  All other ROS reviewed and negative.     Physical Exam/Data:   Vitals:   11/01/20 1059 11/01/20 1135 11/01/20 1200  BP: (!) 127/103  (!) 124/98  Pulse: (!) 57  (!) 147  Resp: 20  (!) 27  Temp: 98.7 F (37.1 C)    TempSrc: Oral    SpO2: 100%  96%  Weight:  106.6 kg   Height:  '6\' 5"'$  (1.956 m)    No intake or output data in the 24 hours ending 11/01/20 1204 Last 3 Weights 11/01/2020 11/01/2020 10/06/2020  Weight (lbs) 235 lb 235 lb 214 lb 15.2 oz  Weight (kg) 106.595 kg 106.595 kg 97.5 kg     Body mass index is 27.87 kg/m.  General:  Well nourished, well  developed, in no acute distress HEENT: normal Lymph: no adenopathy Neck: no JVD Endocrine:  No thryomegaly Vascular: No carotid bruits; FA pulses 2+ bilaterally without bruits  Cardiac:  irreg-irreg, tachycardic; no murmus, gallops or rubs Lungs:  CTA b/l, no wheezing, rhonchi or rales  Abd: soft, nontender  Ext: no edema Musculoskeletal:  No deformities Skin: warm and dry  Neuro:  no focal abnormalities noted Psych:  Normal affect   EKG:  The EKG was personally reviewed and demonstrates:    AFib 169bpm  OLD 10/05/2020:  Aflutter (atypical vs coarse AFib 105bpm 10/06/20: Aflutter vs Afib 123bpm SR 82bpm, PAcs, 82bpm  10/31/2015: Aflutter  165(probably typical)   Telemetry:  Telemetry was personally reviewed and demonstrates:   AFib 150's-160s  Relevant CV Studies:  10/06/20: TEE/DCCV IMPRESSIONS   1. Left ventricular ejection fraction, by estimation, is 40 to 45%. The  left ventricle has mildly decreased function. The left ventricle  demonstrates global hypokinesis.   2. Right ventricular systolic function is normal. The right ventricular  size is mildly enlarged.   3. Left atrial size was mildly dilated. No left atrial/left atrial  appendage thrombus was detected.   4. The mitral valve is normal in structure. Mild mitral valve  regurgitation.   5. The aortic valve is tricuspid. Aortic valve regurgitation is not  visualized.   Conclusion(s)/Recommendation(s): No LA/LAA thrombus identified. Successful  cardioversion performed with restoration of normal sinus rhythm.   Laboratory Data:  High Sensitivity Troponin:  No results for input(s): TROPONINIHS in the last 720 hours.   ChemistryNo results for input(s): NA, K, CL, CO2, GLUCOSE, BUN, CREATININE, CALCIUM, GFRNONAA, GFRAA, ANIONGAP in the last 168 hours.  No results for input(s): PROT, ALBUMIN, AST, ALT, ALKPHOS, BILITOT in the last 168 hours. Hematology Recent Labs  Lab 11/01/20 1114  WBC 9.6  RBC 4.27  HGB 13.5   HCT 40.5  MCV 94.8  MCH 31.6  MCHC 33.3  RDW 14.1  PLT 293   BNPNo results for input(s): BNP, PROBNP in the last 168 hours.  DDimer No results for input(s): DDIMER in the last 168 hours.   Radiology/Studies:  DG Chest 2 View  Result Date: 11/01/2020 CLINICAL DATA:  Shortness of breath EXAM: CHEST - 2 VIEW COMPARISON:  Multiple priors, most recent chest x-ray dated October 05, 2020 FINDINGS: Cardiac and mediastinal contours are unchanged in within normal limits. Bibasilar atelectasis. No focal consolidation. No evidence of pneumothorax. Blunting of the costophrenic angles, possibly due to trace pleural effusions. IMPRESSION: Bibasilar atelectasis. Possible trace bilateral pleural effusions. Electronically Signed   By: Yetta Glassman MD   On: 11/01/2020 11:54     Assessment and Plan:   Paroxysmal AFib He also has remote typical AFlutter and more recently an atypical AFlutter CHA2DS2Vasc is one for cardiomyopathy  (his score will be 2 next month at his 65th birthday) He reports absolute compliance with his Eliquis  Recommend DCCV in the ER (Dr. Quentin Ore discussed with ER MD who will perform DCCV)  2. Cardiomyopathy Suspect non-ischemic Though he gets some jaw discomfort with RVR and will need coronary eval of some kind Likely plan for CT while here  3. Acute/chronic CHF Start IV lasix and follow  4. RA Resume his home meds  Admit to cardiac telemetry   Risk Assessment/Risk Scores:    For questions or updates, please contact [place] Please consult [email or web address] for contact info under    Signed, Baldwin [place], PA-C  11/01/2020 12:04 PM;t

## 2020-11-01 NOTE — ED Provider Notes (Addendum)
[facility] EMERGENCY DEPARTMENT Provider Note   CSN: [id number] Arrival date & time: 11/01/20  1042     History Chief Complaint  Patient presents with   Atrial Fibrillation    Peter Mcmillan is a 65 y.o. male.   Atrial Fibrillation   65 year old male with a history of atrial fibrillation on Eliquis presenting to the emergency department with 2 days of symptoms of shortness of breath, heart palpitations, bilateral lower extremity edema.  The patient was sent from his cardiologist office for admission and further evaluation due to atrial fibrillation with RVR.  The patient states that he read online that beta-blockers can cause lower extremity edema so he stopped taking his beta-blocker on Friday.  He still takes Eliquis.  He has felt fatigued and with heart palpitations and shortness of breath with worsening lower extremity edema since Saturday.  He denies any fevers or chills.  He endorses a mild cough.  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   High risk medication use 07/15/2020   Allergic rhinitis due to pollen 06/11/2020   ED (erectile dysfunction) of organic origin 06/11/2020   Essential hypertension 06/11/2020   Gastro-esophageal reflux disease without esophagitis 06/11/2020   Hyperlipidemia 06/11/2020   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   Prediabetes 06/11/2020   Tobacco user 06/11/2020   Vitamin D deficiency 06/11/2020   Bilateral hand pain 06/11/2020   Bilateral shoulder pain 06/11/2020   Atrial flutter ([place]) 12/09/2013   PULMONARY NODULE 08/18/2008   COUGH 08/18/2008    Past Surgical History:   Procedure Laterality Date   [place] N/A 10/06/2020   Procedure: CARDIOVERSION;  Surgeon: Donato Heinz, MD;  Location: [place];  Service: Cardiovascular;  Laterality: N/A;   MELANOMA EXCISION  2020   TEE WITHOUT CARDIOVERSION N/A 10/06/2020   Procedure: TRANSESOPHAGEAL ECHOCARDIOGRAM (TEE);  Surgeon: Donato Heinz, MD;  Location: [facility] ENDOSCOPY;  Service: Cardiovascular;  Laterality: N/A;       Family History  Problem Relation Age of Onset   Heart attack Father    Heart attack Mother    Dementia Mother    Healthy Sister    Healthy Son     Social History   Tobacco Use   Smoking status: Never   Smokeless tobacco: Current    Types: Snuff  Vaping Use   Vaping Use: Never used  Substance Use Topics   Alcohol use: Yes    Comment: Rare   Drug use: No    Home Medications Prior to Admission medications   Medication Sig Start Date End Date Taking? Authorizing Provider  apixaban (ELIQUIS) 5 MG TABS tablet Take 1 tablet (5 mg total) by mouth 2 (two) times daily. 10/06/20  Yes Barrett, Evelene Croon, PA-C  cholecalciferol (VITAMIN D3) 25 MCG (1000 UNIT) tablet Take 1,000 Units by mouth daily.   Yes [provider]  diltiazem (CARDIZEM CD) 120 MG 24 hr capsule Take 1 capsule (120 mg total) by mouth 2 (two) times daily. 10/06/20  Yes Barrett, Evelene Croon, PA-C  folic acid (FOLVITE) 1 MG tablet Take 1 tablet (1 mg total) by  mouth daily. 09/23/20  Yes Rice, Resa Miner, MD  loratadine (CLARITIN) 10 MG tablet Take 10 mg by mouth as needed for allergies.   Yes [provider]  methotrexate (RHEUMATREX) 2.5 MG tablet Take 2.5 mg by mouth once a week. Take 6 tablets once a week, every Sunday.   Caution:Chemotherapy. Protect from light.   Yes [provider]  metoprolol tartrate (LOPRESSOR) 25 MG tablet Take 0.5 tablets (12.5 mg total) by mouth 3 (three) times daily. 10/06/20  Yes Barrett, Evelene Croon, PA-C  predniSONE (DELTASONE) 10  MG tablet Take 2 tablets (20 mg total) by mouth daily. 09/23/20  Yes Rice, Resa Miner, MD  omeprazole (PRILOSEC) 40 MG capsule Take 40 mg by mouth daily. Patient not taking: Reported on 11/01/2020 12/17/19   [provider]    Allergies    Codeine, Other, Penicillin g, and Penicillins  Review of Systems   Review of Systems  Physical Exam Updated Vital Signs BP 114/84   Pulse 92   Temp 98.2 F (36.8 C) (Oral)   Resp (!) 25   Ht '6\' 5"'$  (1.956 m)   Wt 106.6 kg   SpO2 96%   BMI 27.87 kg/m   Physical Exam Vitals and nursing note reviewed.  Constitutional:      Appearance: He is well-developed.  HENT:     Head: Normocephalic and atraumatic.  Eyes:     Conjunctiva/sclera: Conjunctivae normal.  Cardiovascular:     Rate and Rhythm: Tachycardia present. Rhythm irregular.     Heart sounds: No murmur heard. Pulmonary:     Effort: Pulmonary effort is normal. No respiratory distress.     Breath sounds: Normal breath sounds.  Abdominal:     Palpations: Abdomen is soft.     Tenderness: There is no abdominal tenderness.  Musculoskeletal:     Cervical back: Neck supple.     Right lower leg: Edema present.     Left lower leg: Edema present.  Skin:    General: Skin is warm and dry.  Neurological:     General: No focal deficit present.     Mental Status: He is alert and oriented to person, place, and time. Mental status is at baseline.     Cranial Nerves: No cranial nerve deficit.     Sensory: No sensory deficit.     Motor: No weakness.     Gait: Gait normal.    ED Results / Procedures / Treatments   Labs (all labs ordered are listed, but only abnormal results are displayed) Labs Reviewed  BASIC METABOLIC PANEL - Abnormal; Notable for the following components:      Result Value   Glucose, Bld 139 (*)    All other components within normal limits  BRAIN NATRIURETIC PEPTIDE - Abnormal; Notable for the following components:   B Natriuretic Peptide 438.2 (*)    All  other components within normal limits  RESP PANEL BY RT-PCR (FLU A&B, COVID) ARPGX2  CBC  TSH  BASIC METABOLIC PANEL  PROTIME-INR  TROPONIN I (HIGH SENSITIVITY)  TROPONIN I (HIGH SENSITIVITY)    EKG EKG Interpretation  Date/Time:  Monday November 01 2020 14:30:17 EDT Ventricular Rate:  83 PR Interval:  147 QRS Duration: 83 QT Interval:  340 QTC Calculation: 400 R Axis:   57 Text Interpretation: Sinus rhythm Ventricular premature complex Confirmed by Regan Lemming ([id number]) on 11/01/2020 3:04:11 PM  Radiology DG Chest 2 View  Result Date: 11/01/2020 CLINICAL DATA:  Shortness of breath EXAM: CHEST - 2  VIEW COMPARISON:  Multiple priors, most recent chest x-ray dated October 05, 2020 FINDINGS: Cardiac and mediastinal contours are unchanged in within normal limits. Bibasilar atelectasis. No focal consolidation. No evidence of pneumothorax. Blunting of the costophrenic angles, possibly due to trace pleural effusions. IMPRESSION: Bibasilar atelectasis. Possible trace bilateral pleural effusions. Electronically Signed   By: Yetta Glassman MD   On: 11/01/2020 11:54    Procedures .Critical Care  Date/Time: 11/01/2020 1:27 PM Performed by: Regan Lemming, MD Authorized by: Regan Lemming, MD   Critical care provider statement:    Critical care time (minutes):  39   Critical care was necessary to treat or prevent imminent or life-threatening deterioration of the following conditions:  Cardiac failure   Critical care was time spent personally by me on the following activities:  Discussions with consultants, evaluation of patient's response to treatment, examination of patient, ordering and performing treatments and interventions, ordering and review of laboratory studies, ordering and review of radiographic studies, pulse oximetry, re-evaluation of patient's condition, obtaining history from patient or surrogate and review of old charts .Cardioversion  Date/Time: 11/01/2020 2:27 PM Performed by:  Regan Lemming, MD Authorized by: Regan Lemming, MD   Consent:    Consent obtained:  Verbal and written   Consent given by:  Patient   Risks discussed:  Death, induced arrhythmia and pain   Alternatives discussed:  No treatment and rate-control medication Pre-procedure details:    Cardioversion basis:  Elective   Rhythm:  Atrial fibrillation Patient sedated: Yes. Refer to sedation procedure documentation for details of sedation.  Attempt one:    Cardioversion mode:  Synchronous   Waveform:  Biphasic   Shock (Joules):  200   Shock outcome:  Conversion to normal sinus rhythm Post-procedure details:    Patient status:  Awake   Patient tolerance of procedure:  Tolerated well, no immediate complications .Sedation  Date/Time: 11/01/2020 2:20 PM Performed by: Regan Lemming, MD Authorized by: Regan Lemming, MD   Consent:    Consent obtained:  Verbal and written   Consent given by:  Patient   Risks discussed:  Allergic reaction, prolonged hypoxia resulting in organ damage, dysrhythmia, inadequate sedation, vomiting, nausea and respiratory compromise necessitating ventilatory assistance and intubation Universal protocol:    Immediately prior to procedure, a time out was called: yes   Indications:    Procedure performed:  Cardioversion Pre-sedation assessment:    Time since last food or drink:  6 hours   ASA classification: class 3 - patient with severe systemic disease     Mouth opening:  3 or more finger widths   Thyromental distance:  3 finger widths   Mallampati score:  II - soft palate, uvula, fauces visible   Neck mobility: normal     Pre-sedation assessments completed and reviewed: airway patency, cardiovascular function, hydration status, mental status, nausea/vomiting, pain level, respiratory function and temperature     Pre-sedation assessment completed:  11/01/2020 2:25 PM Immediate pre-procedure details:    Reassessment: Patient reassessed immediately prior to procedure      Reviewed: vital signs, relevant labs/tests and NPO status     Verified: bag valve mask available, emergency equipment available, intubation equipment available, IV patency confirmed, oxygen available, reversal medications available and suction available   Procedure details (see MAR for exact dosages):    Preoxygenation:  Nasal cannula   Sedation:  Etomidate   Intended level of sedation: deep   Analgesia:  Fentanyl   Intra-procedure monitoring:  Blood pressure monitoring, continuous capnometry, frequent  LOC assessments, frequent vital sign checks, continuous pulse oximetry and cardiac monitor   Intra-procedure events: respiratory depression     Intra-procedure management:  BVM ventilation   Total Provider sedation time (minutes):  7 Post-procedure details:    Post-sedation assessment completed:  11/01/2020 2:46 PM   Attendance: Constant attendance by certified staff until patient recovered     Recovery: Patient returned to pre-procedure baseline     Post-sedation assessments completed and reviewed: airway patency, cardiovascular function, mental status, nausea/vomiting and respiratory function     Patient is stable for discharge or admission: yes     Procedure completion:  Tolerated well, no immediate complications   Medications Ordered in ED Medications  etomidate (AMIDATE) injection 10 mg (10 mg Intravenous Not Given 11/01/20 1521)  nitroGLYCERIN (NITROSTAT) SL tablet 0.4 mg (has no administration in time range)  acetaminophen (TYLENOL) tablet 650 mg (has no administration in time range)  ondansetron (ZOFRAN) injection 4 mg (has no administration in time range)  predniSONE (DELTASONE) tablet 20 mg (has no administration in time range)  apixaban (ELIQUIS) tablet 5 mg (has no administration in time range)  furosemide (LASIX) injection 40 mg (40 mg Intravenous Given 11/01/20 1435)  fentaNYL (SUBLIMAZE) injection 50 mcg (50 mcg Intravenous Not Given 11/01/20 1522)  fentaNYL (SUBLIMAZE) 100  MCG/2ML injection (has no administration in time range)  metoprolol tartrate (LOPRESSOR) tablet 25 mg (has no administration in time range)  diltiazem (CARDIZEM) injection 25 mg (25 mg Intravenous Given 11/01/20 1308)  fentaNYL (SUBLIMAZE) injection (50 mcg Intravenous Given 11/01/20 1427)  etomidate (AMIDATE) injection (10 mg Intravenous Given 11/01/20 1428)    ED Course  I have reviewed the triage vital signs and the nursing notes.  Pertinent labs & imaging results that were available during my care of the patient were reviewed by me and considered in my medical decision making (see chart for details).    MDM Rules/Calculators/A&P                           65 year old male with a history of atrial fibrillation on Eliquis presenting to the emergency department at the urging of his cardiologist with atrial fibrillation with RVR.  The patient has taken his Eliquis every day and has taken his last 5 doses.  He states that he stopped taking his beta-blocker on Friday because he read it can cause worsening lower extremity edema.  He endorses fatigue, shortness of breath, worsening lower extremity edema since this past Saturday.  He thinks he may have been in atrial fibrillation with chest palpitations since Saturday.  On arrival, the patient was in atrial fibrillation with RVR to the 170s.  This was confirmed by EKG.  Cardiology was consulted in the emergency department and bedside shortly after patient arrival after admission.  Cardiology requested cardioversion.  The patient was administered diltiazem with some improvement in rate control but remained in atrial fibrillation with RVR to the 140s.  The patient remained hemodynamically stable in the emergency department.  The patient was consented for and underwent procedural sedation for cardioversion with subsequent cardioversion to normal sinus rhythm on 1 attempt.  The patient was subsequently admitted to the cardiology service in stable condition.   Hemodynamically stable throughout his time in the emergency department with no noted complications from cardioversion.  Remained in normal sinus rhythm after. Final Clinical Impression(s) / ED Diagnoses Final diagnoses:  Atrial fibrillation with RVR ([place])    Rx / DC Orders ED  Discharge Orders     None        Regan Lemming, MD 11/01/20 2150    Regan Lemming, MD 11/01/20 2151

## 2020-11-02 ENCOUNTER — Other Ambulatory Visit (HOSPITAL_COMMUNITY): Payer: Self-pay

## 2020-11-02 ENCOUNTER — Observation Stay (HOSPITAL_COMMUNITY): Payer: Commercial Managed Care - PPO

## 2020-11-02 ENCOUNTER — Observation Stay (HOSPITAL_BASED_OUTPATIENT_CLINIC_OR_DEPARTMENT_OTHER): Payer: Commercial Managed Care - PPO

## 2020-11-02 DIAGNOSIS — R609 Edema, unspecified: Secondary | ICD-10-CM | POA: Diagnosis not present

## 2020-11-02 DIAGNOSIS — I4892 Unspecified atrial flutter: Secondary | ICD-10-CM | POA: Diagnosis not present

## 2020-11-02 DIAGNOSIS — I480 Paroxysmal atrial fibrillation: Secondary | ICD-10-CM | POA: Diagnosis not present

## 2020-11-02 DIAGNOSIS — R079 Chest pain, unspecified: Secondary | ICD-10-CM

## 2020-11-02 LAB — PROTIME-INR
INR: 1.3 — ABNORMAL HIGH (ref 0.8–1.2)
Prothrombin Time: 16.4 seconds — ABNORMAL HIGH (ref 11.4–15.2)

## 2020-11-02 LAB — BASIC METABOLIC PANEL
Anion gap: 10 (ref 5–15)
BUN: 13 mg/dL (ref 8–23)
CO2: 27 mmol/L (ref 22–32)
Calcium: 8.5 mg/dL — ABNORMAL LOW (ref 8.9–10.3)
Chloride: 105 mmol/L (ref 98–111)
Creatinine, Ser: 0.94 mg/dL (ref 0.61–1.24)
GFR, Estimated: >60 mL/min (ref >60)
Glucose, Bld: 97 mg/dL (ref 70–99)
Potassium: 3.5 mmol/L (ref 3.5–5.1)
Sodium: 142 mmol/L (ref 135–145)

## 2020-11-02 MED ORDER — POTASSIUM CHLORIDE CRYS ER 20 MEQ PO TBCR
40.0000 meq | EXTENDED_RELEASE_TABLET | Freq: Two times a day (BID) | ORAL | Status: DC
  Administered 2020-11-02 (×2): 40 meq via ORAL
  Filled 2020-11-02 (×2): qty 2

## 2020-11-02 MED ORDER — FUROSEMIDE 10 MG/ML IJ SOLN
8.0000 mg/h | INTRAVENOUS | Status: DC
  Administered 2020-11-02: 8 mg/h via INTRAVENOUS
  Filled 2020-11-02: qty 20

## 2020-11-02 MED ORDER — METOPROLOL TARTRATE 5 MG/5ML IV SOLN
10.0000 mg | Freq: Once | INTRAVENOUS | Status: AC
  Administered 2020-11-02: 10 mg via INTRAVENOUS

## 2020-11-02 MED ORDER — NITROGLYCERIN 0.4 MG SL SUBL
SUBLINGUAL_TABLET | SUBLINGUAL | Status: AC
  Filled 2020-11-02: qty 2

## 2020-11-02 MED ORDER — METOPROLOL TARTRATE 5 MG/5ML IV SOLN
INTRAVENOUS | Status: AC
  Filled 2020-11-02: qty 5

## 2020-11-02 MED ORDER — NITROGLYCERIN 0.4 MG SL SUBL
0.8000 mg | SUBLINGUAL_TABLET | Freq: Once | SUBLINGUAL | Status: AC
  Administered 2020-11-02: 0.8 mg via SUBLINGUAL

## 2020-11-02 MED ORDER — IOHEXOL 350 MG/ML SOLN
65.0000 mL | Freq: Once | INTRAVENOUS | Status: AC | PRN
  Administered 2020-11-02: 65 mL via INTRAVENOUS

## 2020-11-02 MED ORDER — IOHEXOL 350 MG/ML SOLN
95.0000 mL | Freq: Once | INTRAVENOUS | Status: AC | PRN
  Administered 2020-11-02: 95 mL via INTRAVENOUS

## 2020-11-02 NOTE — Progress Notes (Signed)
Progress Note  Patient Name: Peter Mcmillan Date of Encounter: 11/02/2020  [facility] Cardiologist: Freada Bergeron, MD   Subjective   Less SOB, not to baseline, slightly less bloated, LE edema not so tense.  Inpatient Medications    Scheduled Meds:  apixaban  5 mg Oral BID   etomidate  10 mg Intravenous Once   fentaNYL (SUBLIMAZE) injection  50 mcg Intravenous Once   metoprolol tartrate  25 mg Oral BID   potassium chloride  40 mEq Oral BID   predniSONE  20 mg Oral Daily   Continuous Infusions:  furosemide (LASIX) 200 mg in dextrose 5% 100 mL ('2mg'$ /mL) infusion     PRN Meds: acetaminophen, nitroGLYCERIN, ondansetron (ZOFRAN) IV   Vital Signs    Vitals:   11/02/20 0330 11/02/20 0400 11/02/20 0600 11/02/20 0800  BP: 109/79 99/68 117/78 (!) 138/97  Pulse: 83 85 85 84  Resp: '12 17 14 20  '$ Temp:      TempSrc:      SpO2: 95% 92% 93% 96%  Weight:      Height:        Intake/Output Summary (Last 24 hours) at 11/02/2020 0926 Last data filed at 11/01/2020 1622 Gross per 24 hour  Intake --  Output 900 ml  Net -900 ml   Last 3 Weights 11/01/2020 11/01/2020 10/06/2020  Weight (lbs) 235 lb 235 lb 214 lb 15.2 oz  Weight (kg) 106.595 kg 106.595 kg 97.5 kg      Telemetry    SR, occ-at time more frequent PACs - Personally Reviewed  ECG    No new EKGs - Personally Reviewed  Physical Exam   GEN: No acute distress.   Neck: + JVD to jaw Cardiac: RRR, no murmurs, rubs, or gallops.  Respiratory: CTA b/l GI: Soft, nontender, non-distended  MS: 2+ edema; No deformity. Neuro:  Nonfocal  Psych: Normal affect   Labs    High Sensitivity Troponin:   Recent Labs  Lab 11/01/20 1240 11/01/20 1511  TROPONINIHS 8 7      Chemistry Recent Labs  Lab 11/01/20 1114 11/02/20 0511  NA 138 142  K 4.3 3.5  CL 104 105  CO2 26 27  GLUCOSE 139* 97  BUN 11 13  CREATININE 0.84 0.94  CALCIUM 9.0 8.5*  GFRNONAA >60 >60  ANIONGAP 8 10     Hematology Recent Labs  Lab  11/01/20 1114  WBC 9.6  RBC 4.27  HGB 13.5  HCT 40.5  MCV 94.8  MCH 31.6  MCHC 33.3  RDW 14.1  PLT 293    BNP Recent Labs  Lab 11/01/20 1239  BNP 438.2*     DDimer No results for input(s): DDIMER in the last 168 hours.   Radiology    DG Chest 2 View  Result Date: 11/01/2020 CLINICAL DATA:  Shortness of breath EXAM: CHEST - 2 VIEW COMPARISON:  Multiple priors, most recent chest x-ray dated October 05, 2020 FINDINGS: Cardiac and mediastinal contours are unchanged in within normal limits. Bibasilar atelectasis. No focal consolidation. No evidence of pneumothorax. Blunting of the costophrenic angles, possibly due to trace pleural effusions. IMPRESSION: Bibasilar atelectasis. Possible trace bilateral pleural effusions. Electronically Signed   By: Yetta Glassman MD   On: 11/01/2020 11:54    Cardiac Studies   10/06/20: TEE/DCCV IMPRESSIONS   1. Left ventricular ejection fraction, by estimation, is 40 to 45%. The  left ventricle has mildly decreased function. The left ventricle  demonstrates global hypokinesis.   2. Right  ventricular systolic function is normal. The right ventricular  size is mildly enlarged.   3. Left atrial size was mildly dilated. No left atrial/left atrial  appendage thrombus was detected.   4. The mitral valve is normal in structure. Mild mitral valve  regurgitation.   5. The aortic valve is tricuspid. Aortic valve regurgitation is not  visualized.   Conclusion(s)/Recommendation(s): No LA/LAA thrombus identified. Successful  cardioversion performed with restoration of normal sinus rhythm.  Patient Profile     65 y.o. male with a hx of RA, smoker, Aflutter who was admitted 11/01/2020 with AFib w/RVR and volume OL  Assessment & Plan    Paroxysmal AFib He also has remote typical AFlutter and more recently an atypical AFlutter CHA2DS2Vasc is one for cardiomyopathy  (his score will be 2 next month at his 65th birthday) He reports absolute compliance with his  Eliquis S/p DCCV in the ER yesterday   2. 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 Acute/chronic CHF Unfortunately did not get his 2nd dose of lasix last PM, unclear why Dr. Quentin Ore has seen and examined the patient this AM, d/w RPH, lasix gtt today K+ replacement ordered   4. RA home meds Methotrexate is Sunday's only  For questions or updates, please contact [place] Please consult [email or web address] for contact info under        Signed, Baldwin [place], PA-C  11/02/2020, 9:26 AM

## 2020-11-02 NOTE — ED Notes (Signed)
Attempted to give report. Will call back later.

## 2020-11-02 NOTE — ED Notes (Signed)
Pt transferred upstairs via stretcher via transport.

## 2020-11-02 NOTE — Progress Notes (Signed)
Lower extremity venous has been completed.   Preliminary results in CV Proc.   Abram Sander 11/02/2020 3:30 PM

## 2020-11-02 NOTE — ED Notes (Signed)
Ambulated to bathroom with steady gait

## 2020-11-02 NOTE — ED Notes (Signed)
Patient transported to CT 

## 2020-11-02 NOTE — ED Notes (Signed)
Pt resting comfortably at this time. Denies complaints.

## 2020-11-02 NOTE — Progress Notes (Signed)
Heart Failure Stewardship Pharmacist Progress Note   PCP: Lois Huxley, PA PCP-Cardiologist: Freada Bergeron, MD    HPI:  65 yo M with PMH of aflutter, rheumatoid arthritis, and tobacco use. Presented to EP clinic on 8/1 for acute visit due to recurrent aflib with RVR with shortness of breath and BLE edema. Recommended he come to [facility] ED for [facility] consideration. S/p DCCV x 1 to NSR in ED. Plan to diurese and then EP will complete ablation. Last ECHO (TEE) done on 10/06/20 and LVEF was 40-45%.  Current HF Medications: Furosemide gtt 8 mg/hr  Metoprolol tartrate 25 mg BID  Prior to admission HF Medications: Metoprolol tartrate 12.5 mg TID (stopped taking on 7/29)  Pertinent Lab Values: Serum creatinine 0.94, BUN 13, Potassium 3.5, Sodium 142, BNP 438.2  Vital Signs: Weight: 235 lbs (admission weight: 235 lbs) Blood pressure: 120/80s  Heart rate: 80s  Medication Assistance / Insurance Benefits Check: Does the patient have prescription insurance?  Yes Type of insurance plan: [facility] - Pharmacist, community  Outpatient Pharmacy:  Prior to admission outpatient pharmacy: [facility] Is the patient willing to use [facility] at discharge? Yes Is the patient willing to transition their outpatient pharmacy to utilize a [facility] outpatient pharmacy?   Pending    Assessment: 1. 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 NYHA class III symptoms. - Agree with transitioning to furosemide gtt 8 mg/hr - Continue metoprolol tartrate 25 mg BID. Consider consolidating to XL prior to discharge once optimal dose achieved. - Consider adding Entresto 24/26 mg BID - Consider adding spironolactone and Farxiga prior to discharge   Plan: 1) Medication changes recommended at this time: - Add Entresto 24/26 mg BID tomorrow if BP allows  2) Patient assistance: - Has commercial insurance - eligible to utilize monthly copay cards - Entresto copay $50  per month ($10 per 30-90 day supply with copay card) [place] copay $15 per month ($0 per month with copay card) - HF TOC appt made for 8/11  3)  Education  - To be completed prior to discharge  Kerby Nora, PharmD, BCPS Heart Failure Stewardship Pharmacist Phone [phone]

## 2020-11-02 NOTE — ED Notes (Signed)
Cardiology called me back and said they're not coming to speak with the patient until tomorrow. Pt and spouse informed. Spouse went home.

## 2020-11-02 NOTE — ED Notes (Signed)
Patient transported for testing

## 2020-11-02 NOTE — ED Notes (Signed)
Pt ambulatory to restroom. GCS 15. Introduced myself to patient. Pt upset because nobody has told him any results and wife doesn't want to go home in case she accidentally misses the doctor. Admitting doctor messaged. Waiting for response. Pt aware.

## 2020-11-02 NOTE — ED Notes (Signed)
Secretary paged admitting physician for me.

## 2020-11-03 ENCOUNTER — Other Ambulatory Visit (HOSPITAL_COMMUNITY): Payer: Self-pay

## 2020-11-03 DIAGNOSIS — I4891 Unspecified atrial fibrillation: Secondary | ICD-10-CM

## 2020-11-03 DIAGNOSIS — R7303 Prediabetes: Secondary | ICD-10-CM | POA: Diagnosis not present

## 2020-11-03 DIAGNOSIS — I10 Essential (primary) hypertension: Secondary | ICD-10-CM | POA: Diagnosis not present

## 2020-11-03 DIAGNOSIS — Z20822 Contact with and (suspected) exposure to covid-19: Secondary | ICD-10-CM | POA: Diagnosis not present

## 2020-11-03 DIAGNOSIS — I480 Paroxysmal atrial fibrillation: Secondary | ICD-10-CM | POA: Diagnosis not present

## 2020-11-03 LAB — MAGNESIUM: Magnesium: 2 mg/dL (ref 1.7–2.4)

## 2020-11-03 LAB — BASIC METABOLIC PANEL
Anion gap: 15 (ref 5–15)
BUN: 15 mg/dL (ref 8–23)
CO2: 28 mmol/L (ref 22–32)
Calcium: 9.2 mg/dL (ref 8.9–10.3)
Chloride: 95 mmol/L — ABNORMAL LOW (ref 98–111)
Creatinine, Ser: 0.91 mg/dL (ref 0.61–1.24)
GFR, Estimated: >60 mL/min (ref >60)
Glucose, Bld: 90 mg/dL (ref 70–99)
Potassium: 3.4 mmol/L — ABNORMAL LOW (ref 3.5–5.1)
Sodium: 138 mmol/L (ref 135–145)

## 2020-11-03 MED ORDER — POTASSIUM CHLORIDE CRYS ER 20 MEQ PO TBCR
40.0000 meq | EXTENDED_RELEASE_TABLET | ORAL | Status: AC
  Administered 2020-11-03 (×2): 40 meq via ORAL
  Filled 2020-11-03 (×2): qty 2

## 2020-11-03 MED ORDER — SACUBITRIL-VALSARTAN 24-26 MG PO TABS
1.0000 | ORAL_TABLET | Freq: Two times a day (BID) | ORAL | Status: DC
  Administered 2020-11-03 – 2020-11-04 (×3): 1 via ORAL
  Filled 2020-11-03 (×3): qty 1

## 2020-11-03 MED ORDER — FUROSEMIDE 20 MG PO TABS
20.0000 mg | ORAL_TABLET | Freq: Every day | ORAL | Status: DC
  Administered 2020-11-03 – 2020-11-04 (×2): 20 mg via ORAL
  Filled 2020-11-03 (×2): qty 1

## 2020-11-03 NOTE — Plan of Care (Signed)
?  Problem: Activity: ?Goal: Capacity to carry out activities will improve ?Outcome: Progressing ?  ?Problem: Cardiac: ?Goal: Ability to achieve and maintain adequate cardiopulmonary perfusion will improve ?Outcome: Progressing ?  ?Problem: Education: ?Goal: Ability to demonstrate management of disease process will improve ?Outcome: Progressing ?Goal: Ability to verbalize understanding of medication therapies will improve ?Outcome: Progressing ?Goal: Individualized Educational Video(s) ?Outcome: Progressing ?  ?

## 2020-11-03 NOTE — Progress Notes (Addendum)
Progress Note  Patient Name: Peter Mcmillan Date of Encounter: 11/03/2020  [facility] Cardiologist: Freada Bergeron, MD   Subjective   Feels markedly improved  Inpatient Medications    Scheduled Meds:  apixaban  5 mg Oral BID   etomidate  10 mg Intravenous Once   fentaNYL (SUBLIMAZE) injection  50 mcg Intravenous Once   furosemide  20 mg Oral Daily   metoprolol tartrate  25 mg Oral BID   potassium chloride  40 mEq Oral Q3H   predniSONE  20 mg Oral Daily   Continuous Infusions:   PRN Meds: acetaminophen, nitroGLYCERIN, ondansetron (ZOFRAN) IV   Vital Signs    Vitals:   11/02/20 2210 11/03/20 0112 11/03/20 0353 11/03/20 0719  BP:  104/60 119/84 119/84  Pulse:  60 82 71  Resp:  '19 18 18  '$ Temp: 97.8 F (36.6 C) 97.9 F (36.6 C) 98.1 F (36.7 C) 98.1 F (36.7 C)  TempSrc: Oral Oral Oral Oral  SpO2:  98% 100% 97%  Weight:  95.4 kg    Height:  '6\' 5"'$  (1.956 m)      Intake/Output Summary (Last 24 hours) at 11/03/2020 0933 Last data filed at 11/03/2020 0827 Gross per 24 hour  Intake 176.07 ml  Output 7175 ml  Net -6998.93 ml   Last 3 Weights 11/03/2020 11/01/2020 11/01/2020  Weight (lbs) 210 lb 6.4 oz 235 lb 235 lb  Weight (kg) 95.437 kg 106.595 kg 106.595 kg      Telemetry    SR, occ-at time more frequent PACs - Personally Reviewed  ECG    No new EKGs - Personally Reviewed  Physical Exam   GEN: No acute distress.   Neck:no JVD  Cardiac: RRR, no murmurs, rubs, or gallops.  Respiratory: CTA b/l GI: Soft, nontender, non-distended  MS: trace if any edema; No deformity. Neuro:  Nonfocal  Psych: Normal affect   Labs    High Sensitivity Troponin:   Recent Labs  Lab 11/01/20 1240 11/01/20 1511  TROPONINIHS 8 7      Chemistry Recent Labs  Lab 11/01/20 1114 11/02/20 0511 11/03/20 0223  NA 138 142 138  K 4.3 3.5 3.4*  CL 104 105 95*  CO2 '26 27 28  '$ GLUCOSE 139* 97 90  BUN '11 13 15  '$ CREATININE 0.84 0.94 0.91  CALCIUM 9.0 8.5* 9.2   GFRNONAA >60 >60 >60  ANIONGAP '8 10 15     '$ Hematology Recent Labs  Lab 11/01/20 1114  WBC 9.6  RBC 4.27  HGB 13.5  HCT 40.5  MCV 94.8  MCH 31.6  MCHC 33.3  RDW 14.1  PLT 293    BNP Recent Labs  Lab 11/01/20 1239  BNP 438.2*     DDimer No results for input(s): DDIMER in the last 168 hours.   Radiology    DG Chest 2 View  Result Date: 11/01/2020 CLINICAL DATA:  Shortness of breath EXAM: CHEST - 2 VIEW COMPARISON:  Multiple priors, most recent chest x-ray dated October 05, 2020 FINDINGS: Cardiac and mediastinal contours are unchanged in within normal limits. Bibasilar atelectasis. No focal consolidation. No evidence of pneumothorax. Blunting of the costophrenic angles, possibly due to trace pleural effusions. IMPRESSION: Bibasilar atelectasis. Possible trace bilateral pleural effusions. Electronically Signed   By: Yetta Glassman MD   On: 11/01/2020 11:54   CT Angio Chest Pulmonary Embolism (PE) W or WO Contrast  Result Date: 11/02/2020 CLINICAL DATA:  Concern for right lower lobe pulmonary emboli, abnormal CT performed earlier  today EXAM: CT ANGIOGRAPHY CHEST WITH CONTRAST TECHNIQUE: Multidetector CT imaging of the chest was performed using the standard protocol during bolus administration of intravenous contrast. Multiplanar CT image reconstructions and MIPs were obtained to evaluate the vascular anatomy. CONTRAST:  37m OMNIPAQUE IOHEXOL 350 MG/ML SOLN COMPARISON:  08/19/2008, 11/02/2020 FINDINGS: Cardiovascular: This is a technically adequate evaluation of the pulmonary vasculature. No filling defects or pulmonary emboli. The heart is unremarkable without pericardial effusion. Normal caliber of the thoracic aorta. Mediastinum/Nodes: No enlarged mediastinal, hilar, or axillary lymph nodes. Thyroid gland, trachea, and esophagus demonstrate no significant findings. Lungs/Pleura: There are small bilateral pleural effusions unchanged since earlier CT. Continued subsegmental consolidation  within the dependent right lower lobe, favor atelectasis. No pneumothorax. Mild bilateral bronchial wall thickening greatest in the lower lobes. Upper Abdomen: No acute abnormality. Musculoskeletal: No acute or destructive bony lesions. Reconstructed images demonstrate no additional findings. Review of the MIP images confirms the above findings. IMPRESSION: 1. No evidence of pulmonary embolus. 2. Stable small bilateral pleural effusions, with subsegmental right lower lobe consolidation likely representing atelectasis. 3. Bilateral bronchial wall thickening greatest in the lower lobes, which could reflect reactive airway disease or bronchitis. Electronically Signed   By: MRanda NgoM.D.   On: 11/02/2020 17:10   CT CORONARY MORPH W/CTA COR W/SCORE W/CA W/CM &/OR WO/CM  Addendum Date: 11/02/2020   ADDENDUM REPORT: 11/02/2020 15:02 CLINICAL DATA:  65year old with atrial flutter, chest pain EXAM: Cardiac/Coronary  CTA TECHNIQUE: The patient was scanned on a [employer] FINDINGS: A 100 kV prospective scan was triggered in the descending thoracic aorta at 111 HU's. Axial non-contrast 3 mm slices were carried out through the heart. The data set was analyzed on a dedicated work station and scored using the [place] G[employer] rotation speed was 250 msecs and collimation was .6 mm. Beta blockade and 0.8 mg of sl NTG was given. The 3D data set was reconstructed in 5% intervals of the 67-82 % of the R-R cycle. Diastolic phases were analyzed on a dedicated work station using MPR, MIP and VRT modes. The patient received 80 cc of contrast. There is misregistration artifact (affects a small portion of circumflex read). Aorta:  Normal size.  No calcifications.  No dissection. Aortic Valve: No calcifications. Coronary Arteries:  Normal coronary origin.  Right dominance. RCA is a large dominant artery that gives rise to PDA and PLA. There is no plaque. Left main is a large artery that gives rise to LAD and LCX  arteries. LAD is a large vessel that has no plaque. LCX is a non-dominant artery that gives rise to two large OM branches. There is no plaque. (A portion of the proximal to mid vessel is not interpretable due to misregistration artifact). Other findings: Normal pulmonary vein drainage into the left atrium. RUPV: 19.2 x 15.8 mm, area 2.2 cm2 RLPV: 23.7 x 21.0 mm, area 3.82 cm2 LUPV: 20.2 x 15.6 mm, area 2.46 cm2 LLPV: 20.4 x 16.8 mm, area 2.71 cm2 Can not exclude left atrial appendage thrombus. There are no delayed acquisitions to confirm absence of clot. Normal size of the pulmonary artery. Please see radiology report for non cardiac findings. IMPRESSION: 1. Coronary calcium score of 0. This was 0 percentile for age and sex matched control. 2. Normal coronary origin with right dominance. 3. No evidence of CAD. (A small portion of the proximal to mid circumflex is not interpretable due to misregistration artifact). 4. Can not exclude left atrial appendage thrombus. There  are no delayed acquisitions to confirm absence of clot. Consider repeat dedicated pulmonary vein protocol study or TEE. Electronically Signed   By: Candee Furbish MD   On: 11/02/2020 15:02   Addendum Date: 11/02/2020   ADDENDUM REPORT: 11/02/2020 14:20 ADDENDUM: These results were called by telephone at the time of interpretation on 11/02/2020 at 2:20 pm to provider RENEE URSUY , who verbally acknowledged these results. Electronically Signed   By: Zetta Bills M.D.   On: 11/02/2020 14:20   Result Date: 11/02/2020 EXAM: OVER-READ INTERPRETATION  CT CHEST The following report is an over-read performed by radiologist Dr. Zetta Bills of [facility] [place] on 11/02/2020. This over-read does not include interpretation of cardiac or coronary anatomy or pathology. The coronary calcium score/coronary CTA interpretation by the cardiologist is attached. COMPARISON:  Comparison made with Aug 19, 2008. FINDINGS: Vascular: Please see dedicated cardiac CT  report and coronary artery calcium score provided by the cardiologist. Subtle area of low attenuation in segmental branch to the RIGHT lower lobe favored to represent artifact but in an area subtending airspace disease at the periphery of the RIGHT lung base that is partially visualized. Mediastinum/Nodes: Esophagus grossly normal. Scattered small lymph nodes throughout the visible chest without signs of adenopathy. Lungs/Pleura: Small effusions at the lung bases. Patchy ground-glass. Bronchial wall thickening. Partially visualized airspace disease at the RIGHT lung base. Upper Abdomen: Hepatic steatosis no acute upper abdominal process. Musculoskeletal: No acute bone finding or destructive bone lesion. IMPRESSION: 1. Subtle area of low attenuation segmental RIGHT lower lobe pulmonary arterial branch already very poorly opacified, potentially artifact, difficult to exclude small embolism particularly given appearance on image 96 of series 602. Given that this supplies an of peripheral airspace disease CT angiography of the chest may be helpful to exclude pulmonary embolism and resultant infarct. 2. Airspace disease at the RIGHT lung base at the periphery partially imaged in the setting of bronchial wall thickening may reflect pneumonitis or even atelectasis. Small pulmonary infarct is considered as well given above findings. 3. Mild bronchial wall thickening as above raising the question of background inflammatory changes. 4. Small pleural effusions. 5. Hepatic steatosis. A call is out to the referring provider To further discuss findings in the above case. Electronically Signed: By: Zetta Bills M.D. On: 11/02/2020 13:49   VAS [place] LOWER EXTREMITY VENOUS (DVT)  Result Date: 11/02/2020  Lower Venous DVT Study Patient Name:  Peter Mcmillan  Date of Exam:   11/02/2020 Medical Rec #: [id number]         Accession #:    [id number] Date of Birth: 1956-01-07         Patient Gender: M Patient Age:   64Y Exam Location:   [facility] Procedure:      VAS [place] LOWER EXTREMITY VENOUS (DVT) Referring Phys: [id number] RENEE LYNN URSUY --------------------------------------------------------------------------------  Indications: Edema.  Comparison Study: no prior Performing Technologist: Agricultural engineer  Examination Guidelines: A complete evaluation includes B-mode imaging, spectral Doppler, color Doppler, and power Doppler as needed of all accessible portions of each vessel. Bilateral testing is considered an integral part of a complete examination. Limited examinations for reoccurring indications may be performed as noted. The reflux portion of the exam is performed with the patient in reverse Trendelenburg.  +---------+---------------+---------+-----------+----------+--------------+ RIGHT    CompressibilityPhasicitySpontaneityPropertiesThrombus Aging +---------+---------------+---------+-----------+----------+--------------+ CFV      Full           Yes      Yes                                 +---------+---------------+---------+-----------+----------+--------------+  SFJ      Full                                                        +---------+---------------+---------+-----------+----------+--------------+ FV Prox  Full                                                        +---------+---------------+---------+-----------+----------+--------------+ FV Mid   Full                                                        +---------+---------------+---------+-----------+----------+--------------+ FV DistalFull                                                        +---------+---------------+---------+-----------+----------+--------------+ PFV      Full                                                        +---------+---------------+---------+-----------+----------+--------------+ POP      Full           Yes      Yes                                  +---------+---------------+---------+-----------+----------+--------------+ PTV      Full                                                        +---------+---------------+---------+-----------+----------+--------------+ PERO     Full                                                        +---------+---------------+---------+-----------+----------+--------------+   +---------+---------------+---------+-----------+----------+--------------+ LEFT     CompressibilityPhasicitySpontaneityPropertiesThrombus Aging +---------+---------------+---------+-----------+----------+--------------+ CFV      Full           Yes      Yes                                 +---------+---------------+---------+-----------+----------+--------------+ SFJ      Full                                                        +---------+---------------+---------+-----------+----------+--------------+  FV Prox  Full                                                        +---------+---------------+---------+-----------+----------+--------------+ FV Mid   Full                                                        +---------+---------------+---------+-----------+----------+--------------+ FV DistalFull                                                        +---------+---------------+---------+-----------+----------+--------------+ PFV      Full                                                        +---------+---------------+---------+-----------+----------+--------------+ POP      Full           Yes      Yes                                 +---------+---------------+---------+-----------+----------+--------------+ PTV      Full                                                        +---------+---------------+---------+-----------+----------+--------------+ PERO     Full                                                         +---------+---------------+---------+-----------+----------+--------------+     Summary: BILATERAL: - No evidence of deep vein thrombosis seen in the lower extremities, bilaterally. -No evidence of popliteal cyst, bilaterally.   *See table(s) above for measurements and observations. Electronically signed by Deitra Mayo MD on 11/02/2020 at 4:59:09 PM.    Final     Cardiac Studies   10/06/20: TEE/DCCV IMPRESSIONS   1. Left ventricular ejection fraction, by estimation, is 40 to 45%. The  left ventricle has mildly decreased function. The left ventricle  demonstrates global hypokinesis.   2. Right ventricular systolic function is normal. The right ventricular  size is mildly enlarged.   3. Left atrial size was mildly dilated. No left atrial/left atrial  appendage thrombus was detected.   4. The mitral valve is normal in structure. Mild mitral valve  regurgitation.   5. The aortic valve is tricuspid. Aortic valve regurgitation is not  visualized.   Conclusion(s)/Recommendation(s): No LA/LAA thrombus identified. Successful  cardioversion performed with restoration of normal sinus rhythm.  Patient Profile  65 y.o. male with a hx of RA, smoker, Aflutter who was admitted 11/01/2020 with AFib w/RVR and volume OL  Assessment & Plan    Paroxysmal AFib He also has remote typical AFlutter and more recently an atypical AFlutter CHA2DS2Vasc is one for cardiomyopathy  (his score Acacia Latorre be 2 next month at his 65th birthday) He reports absolute compliance with his Eliquis S/p DCCV in the ER  Maintaining SR  Pt would like to have further discussions regarding Afib ablation with Dr. Quentin Ore, we Alby Schwabe plan for this out patient.    2. Cardiomyopathy non-ischemic CT without CAD (And no PE, LE neg for DVT)   3. Acute/chronic CHF Brisk response to lasix gtt Output 7L Weight down 15lbs Stop lasix gtt >> PO Replace K+ Start entresto today   4. RA home meds Methotrexate is Sunday's  only  Probably discharge tomorrow  For questions or updates, please contact [place] Please consult [email or web address] for contact info under        Signed, Baldwin Jamaica, PA-C  11/03/2020, 9:33 AM    I have seen and examined this patient with Tommye Standard.  Agree with above, note added to reflect my findings.  On exam, RRR, no murmurs.  Patient feeling much improved with good diuresis.  Net out 8.1 L on Lasix drip.  We Tanicia Wolaver transition to p.o. Lasix 20 mg and start him on Entresto.  Should he continue to diurese well and remained stable, likely discharge tomorrow.  Armya Westerhoff M. Tareka Jhaveri MD 11/03/2020 1:02 PM

## 2020-11-03 NOTE — Discharge Instructions (Addendum)
You have an appointment set up with the [facility].  Multiple studies have shown that being followed by a dedicated atrial fibrillation clinic in addition to the standard care you receive from your other physicians improves health. We believe that enrollment in the atrial fibrillation clinic will allow [place] to better care for you.   The phone number to the [facility] is [phone]. The clinic is staffed Monday through Friday from 8:30am to 5pm.  Parking Directions: The clinic is located in the Heart and Vascular Building connected to [facility]. 1)From [address] turn on to [address] and go to the 3rd entrance  (Heart and Vascular entrance) on the right. 2)Look to the right for Heart &Vascular Parking Garage. 3)A code for the entrance is required, for August is 5544.   4)Take the elevators to the 1st floor. Registration is in the room with the glass walls at the end of the hallway.  If you have any trouble parking or locating the clinic, please don't hesitate to call [phone].     Information on my medicine - ELIQUIS (apixaban)  This medication education was reviewed with me or my healthcare representative as part of my discharge preparation.  The pharmacist that spoke with me during my hospital stay was:    Why was Eliquis prescribed for you? Eliquis was prescribed for you to reduce the risk of a blood clot forming that can cause a stroke if you have a medical condition called atrial fibrillation (a type of irregular heartbeat).  What do You need to know about Eliquis ? Take your Eliquis TWICE DAILY - one tablet in the morning and one tablet in the evening with or without food. If you have difficulty swallowing the tablet whole please discuss with your pharmacist how to take the medication safely.  Take Eliquis exactly as prescribed by your doctor and DO NOT stop taking Eliquis without talking to the doctor who prescribed the  medication.  Stopping may increase your risk of developing a stroke.  Refill your prescription before you run out.  After discharge, you should have regular check-up appointments with your healthcare provider that is prescribing your Eliquis.  In the future your dose may need to be changed if your kidney function or weight changes by a significant amount or as you get older.  What do you do if you miss a dose? If you miss a dose, take it as soon as you remember on the same day and resume taking twice daily.  Do not take more than one dose of ELIQUIS at the same time to make up a missed dose.  Important Safety Information A possible side effect of Eliquis is bleeding. You should call your healthcare provider right away if you experience any of the following: Bleeding from an injury or your nose that does not stop. Unusual colored urine (red or dark brown) or unusual colored stools (red or black). Unusual bruising for unknown reasons. A serious fall or if you hit your head (even if there is no bleeding).  Some medicines may interact with Eliquis and might increase your risk of bleeding or clotting while on Eliquis. To help avoid this, consult your healthcare provider or pharmacist prior to using any new prescription or non-prescription medications, including herbals, vitamins, non-steroidal anti-inflammatory drugs (NSAIDs) and supplements.  This website has more information on Eliquis (apixaban): http://www.eliquis.com/eliquis/home

## 2020-11-03 NOTE — Progress Notes (Signed)
Heart Failure Stewardship Pharmacist Progress Note   PCP: Lois Huxley, PA PCP-Cardiologist: Freada Bergeron, MD    HPI:  65 yo M with PMH of aflutter, rheumatoid arthritis, and tobacco use. Presented to EP clinic on 8/1 for acute visit due to recurrent aflib with RVR with shortness of breath and BLE edema. Recommended he come to [facility] ED for [facility] consideration. S/p DCCV x 1 to NSR in ED. Plan to diurese and then EP will complete ablation. Last ECHO (TEE) done on 10/06/20 and LVEF was 40-45%. Cardiac CT without evidence of CAD on 8/2. CTA negative for PE.  Current HF Medications: Furosemide 20 mg daily Metoprolol tartrate 25 mg BID  Prior to admission HF Medications: Metoprolol tartrate 12.5 mg TID (stopped taking on 7/29)  Pertinent Lab Values: Serum creatinine 0.91, BUN 15, Potassium 3.4, Sodium 138, BNP 438.2, Magnesium 2.0  Vital Signs: Weight: 210 lbs (admission weight: 235 lbs) Blood pressure: 120/80s  Heart rate: 70s  Medication Assistance / Insurance Benefits Check: Does the patient have prescription insurance?  Yes Type of insurance plan: [facility] - Pharmacist, community  Outpatient Pharmacy:  Prior to admission outpatient pharmacy: [facility] Is the patient willing to use [facility] at discharge? Yes Is the patient willing to transition their outpatient pharmacy to utilize a [facility] outpatient pharmacy?   Pending    Assessment: 1. 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 NYHA class II symptoms. - Agree with transitioning to furosemide 20 mg PO daily - Continue metoprolol tartrate 25 mg BID. Consider consolidating to XL prior to discharge once optimal dose achieved. - Consider adding Entresto 24/26 mg BID - Consider adding spironolactone and Farxiga prior to discharge   Plan: 1) Medication changes recommended at this time: - Add Entresto 24/26 mg BID  2) Patient assistance: - Has commercial insurance -  eligible to utilize monthly copay cards - Entresto copay $50 per month ($10 per 30-90 day supply with copay card) [place] copay $15 per month ($0 per month with copay card) - HF TOC appt made for 8/11  3)  Education  - To be completed prior to discharge  Kerby Nora, PharmD, BCPS Heart Failure Stewardship Pharmacist Phone [phone]

## 2020-11-03 NOTE — Telephone Encounter (Signed)
Received notification from [place] regarding a prior authorization for [facility]. Authorization has been APPROVED from 09/26/2020 to 05/02/2021.   Patient must fill through [facility]: [phone]  Authorization # [id number]

## 2020-11-04 ENCOUNTER — Other Ambulatory Visit (HOSPITAL_COMMUNITY): Payer: Self-pay

## 2020-11-04 ENCOUNTER — Encounter: Payer: Self-pay | Admitting: Physician Assistant

## 2020-11-04 DIAGNOSIS — I480 Paroxysmal atrial fibrillation: Secondary | ICD-10-CM | POA: Diagnosis not present

## 2020-11-04 DIAGNOSIS — I428 Other cardiomyopathies: Secondary | ICD-10-CM

## 2020-11-04 DIAGNOSIS — I5021 Acute systolic (congestive) heart failure: Secondary | ICD-10-CM

## 2020-11-04 LAB — BASIC METABOLIC PANEL
Anion gap: 11 (ref 5–15)
BUN: 14 mg/dL (ref 8–23)
CO2: 25 mmol/L (ref 22–32)
Calcium: 9.1 mg/dL (ref 8.9–10.3)
Chloride: 99 mmol/L (ref 98–111)
Creatinine, Ser: 0.83 mg/dL (ref 0.61–1.24)
GFR, Estimated: >60 mL/min (ref >60)
Glucose, Bld: 108 mg/dL — ABNORMAL HIGH (ref 70–99)
Potassium: 4.1 mmol/L (ref 3.5–5.1)
Sodium: 135 mmol/L (ref 135–145)

## 2020-11-04 MED ORDER — SACUBITRIL-VALSARTAN 24-26 MG PO TABS
1.0000 | ORAL_TABLET | Freq: Two times a day (BID) | ORAL | 0 refills | Status: DC
  Filled 2020-11-04: qty 59, 29d supply, fill #0

## 2020-11-04 MED ORDER — SACUBITRIL-VALSARTAN 24-26 MG PO TABS: 1.0000 | ORAL_TABLET | Freq: Two times a day (BID) | ORAL | 6 refills | Status: DC

## 2020-11-04 MED ORDER — METOPROLOL TARTRATE 25 MG PO TABS: 25.0000 mg | ORAL_TABLET | Freq: Two times a day (BID) | ORAL | 6 refills | Status: DC

## 2020-11-04 MED ORDER — FUROSEMIDE 20 MG PO TABS: 20.0000 mg | ORAL_TABLET | Freq: Every day | ORAL | 6 refills | Status: DC

## 2020-11-04 NOTE — TOC Transition Note (Signed)
Transition of Care [facility]) - CM/SW Discharge Note   Patient Details  Name: Peter Mcmillan MRN: [id number] Date of Birth: July 31, 1955  [facility] CM/SW Contact:  Zenon Mayo, RN Phone Number: 11/04/2020, 3:14 PM   Clinical Narrative:    NCM spoke with patient at bedside, he is for dc today, NCM gave him the 10.00 copay coupon, TOC filled the first 30 days for patient and will bring to patient room prior to dc.   Final next level of care: Home/Self Care Barriers to Discharge: No Barriers Identified   Patient Goals and CMS Choice Patient states their goals for this hospitalization and ongoing recovery are:: return home      Discharge Placement                       Discharge Plan and Services                  DME Agency: NA       HH Arranged: NA          Social Determinants of Health (SDOH) Interventions     Readmission Risk Interventions No flowsheet data found.

## 2020-11-04 NOTE — TOC Benefit Eligibility Note (Signed)
Transition of Care [facility]) Benefit Eligibility Note    Patient Details  Name: Peter Mcmillan MRN: [id number] Date of Birth: [birth year]   Medication/Dose: Delene Loll  24-26 MG BID  Covered?: Yes     Prescription Coverage Preferred Pharmacy: [facility] with Person/Company/Phone Number:: CAROL  @ [employer] #   [phone]  Co-Pay: $50.00  Prior Approval:  (PREFERRED)  Deductible:  (NO DEDUCTIBLE WITH PLAN)       Memory Argue Phone Number: 11/04/2020, 1:59 PM

## 2020-11-04 NOTE — Discharge Summary (Signed)
ELECTROPHYSIOLOGY PROCEDURE DISCHARGE SUMMARY    Patient ID: Peter Mcmillan,  MRN: [id number], DOB/AGE: 1955-09-05 65 y.o.  Admit date: 11/01/2020 Discharge date: 11/04/2020  Primary Care Physician: Lois Huxley, PA  Primary Cardiologist: Dr. Johney Frame Electrophysiologist: Dr. Lovena Le  Primary Discharge Diagnosis:  Paroxysmal AFib H/o AFlutter 2. Acute/chronic CHF (systolic)  Secondary Discharge Diagnosis:  RA NICM       Likely 2/2 RVR  Allergies  Allergen Reactions   Codeine Other (See Comments)    agitation   Other Hives, Swelling and Other (See Comments)    make feel weird; hives/swelling   Penicillin G Other (See Comments)   Penicillins Itching, Swelling and Rash     Procedures This Admission:  DCCV in the ER day of admission  Brief HPI: Peter Mcmillan is a 65 y.o. male with a hx of RA, smoker, Aflutter and more recently admitted to [facility] 10/05/20 with AFib RVR, he was started on Eliquis, initially rate controlled and then underwent TEE/DCCV, discharged 10/06/20. He returned 11/01/20 recurrent palpitations, racing heart rates.  After a day or so noted some increased DOE and swelling LE.  He was in rapid AFib and acute HF exacerbation.  He was planned for admission, DCCV in  the ER.  Hospital Course:  The patient was admitted a started on IV diuresis, ultimately with lasix gtt, home CCB stopped, and BB continued.  He had excellent response to diuresis down 29lbs today.  He was transitioned to PO lasix and started on Entresto.  He remained in SR.  In d/w Dr. Quentin Ore, he was very interested in pursuing AFib ablation as form of rhythm management, and Dr. Mardene Speak nurse was messaged to initiate that process for out patient.  Coronary CT done to r/o CAD with some jaw discomfort with RVR and noted no CAD, a dedicated CT to r/o PE was negative.  Electrolytes and renal function remained stable   The patient feels well, denies any CP/SOB,  He was examined by Dr. Quentin Ore and  considered stable for discharge to home.    Physical Exam: Vitals:   11/03/20 2110 11/04/20 0422 11/04/20 0824 11/04/20 1359  BP: (!) 122/92 130/88 122/80 118/89  Pulse: 78 83 84 88  Resp:  '18 18 18  '$ Temp:  98 F (36.7 C) 98.2 F (36.8 C) 98.2 F (36.8 C)  TempSrc:  Oral Oral Oral  SpO2:  94% 94% 95%  Weight:  93.6 kg    Height:        GEN- The patient is well appearing, alert and oriented x 3 today.   HEENT: normocephalic, atraumatic; sclera clear, conjunctiva pink; hearing intact; oropharynx clear; neck supple, no JVP Lungs-  CTA b/l, normal work of breathing.  No wheezes, rales, rhonchi Heart- RRR, no murmurs, rubs or gallops, PMI not laterally displaced GI- soft, non-tender, non-distended Extremities- no clubbing, cyanosis, or edema MS- no significant deformity or atrophy Skin- warm and dry, no rash or lesion, left chest without hematoma/ecchymosis Psych- euthymic mood, full affect Neuro- no gross deficits   Labs:   Lab Results  Component Value Date   WBC 9.6 11/01/2020   HGB 13.5 11/01/2020   HCT 40.5 11/01/2020   MCV 94.8 11/01/2020   PLT 293 11/01/2020    Recent Labs  Lab 11/04/20 0344  NA 135  K 4.1  CL 99  CO2 25  BUN 14  CREATININE 0.83  CALCIUM 9.1  GLUCOSE 108*    Discharge Medications:  Allergies as of 11/04/2020  Reactions   Codeine Other (See Comments)   agitation   Other Hives, Swelling, Other (See Comments)   make feel weird; hives/swelling   Penicillin G Other (See Comments)   Penicillins Itching, Swelling, Rash        Medication List     STOP taking these medications    diltiazem 120 MG 24 hr capsule Commonly known as: CARDIZEM CD       TAKE these medications    apixaban 5 MG Tabs tablet Commonly known as: ELIQUIS Take 1 tablet (5 mg total) by mouth 2 (two) times daily.   cholecalciferol 25 MCG (1000 UNIT) tablet Commonly known as: VITAMIN D3 Take 1,000 Units by mouth daily.   folic acid 1 MG  tablet Commonly known as: FOLVITE Take 1 tablet (1 mg total) by mouth daily.   furosemide 20 MG tablet Commonly known as: LASIX Take 1 tablet (20 mg total) by mouth daily. Start taking on: November 05, 2020   loratadine 10 MG tablet Commonly known as: CLARITIN Take 10 mg by mouth as needed for allergies.   methotrexate 2.5 MG tablet Commonly known as: RHEUMATREX Take 2.5 mg by mouth once a week. Take 6 tablets once a week, every Sunday.   Caution:Chemotherapy. Protect from light.   metoprolol tartrate 25 MG tablet Commonly known as: LOPRESSOR Take 1 tablet (25 mg total) by mouth 2 (two) times daily. What changed:  how much to take when to take this   predniSONE 10 MG tablet Commonly known as: DELTASONE Take 2 tablets (20 mg total) by mouth daily.   sacubitril-valsartan 24-26 MG Commonly known as: ENTRESTO Take 1 tablet by mouth 2 (two) times daily.       ASK your doctor about these medications    omeprazole 40 MG capsule Commonly known as: PRILOSEC Take 40 mg by mouth daily.        Disposition: Home Discharge Instructions     Diet - low sodium heart healthy   Complete by: As directed    Increase activity slowly   Complete by: As directed        Follow-up Information     [facility].   Specialty: Cardiology Why: 11/12/20 @ 9:30AM with R. Marlene Lard, PA-C Contact information: [address] [id number] mc [place] [phone]        Vickie Epley, MD .   Specialties: Cardiology, Radiology Why: 11/30/20 @ 2:15PM Contact information: [place] [address] [phone]                 Duration of Discharge Encounter: Greater than 30 minutes including physician time.  Venetia Night, PA-C 11/04/2020 2:33 PM

## 2020-11-04 NOTE — Plan of Care (Signed)

## 2020-11-05 ENCOUNTER — Telehealth: Payer: Self-pay | Admitting: Radiology

## 2020-11-05 ENCOUNTER — Telehealth: Payer: Self-pay

## 2020-11-05 NOTE — Telephone Encounter (Signed)
I do not believe this is a problem to proceed with treatment on Orencia, this medicine carries less risk than alternatives for cardiovascular events and heart failure, including less risk than continuing prednisone. Medication was approved for insurance.

## 2020-11-05 NOTE — Telephone Encounter (Signed)
Patient came by the office to check the status of his Orencia medication.  Patient states he was discharged from the hospital on 11/01/20 notes are in [place] and was told by his cardiologist to make sure the medication wont interfere with current medications.

## 2020-11-05 NOTE — Telephone Encounter (Signed)
No significant interactions between Orencia and Entresto or furosemide. Patient scheduled for Orencia new start on 11/10/20  Knox Saliva, PharmD, MPH, BCPS Clinical Pharmacist (Rheumatology and Pulmonology)

## 2020-11-05 NOTE — Telephone Encounter (Signed)
Spoke with patient, advised Dr. Benjamine Mola does not believe this is a problem to proceed with treatment on Orencia, this medicine carries less risk than alternatives for cardiovascular events and heart failure, including less risk than continuing prednisone. Medication was approved for insurance.

## 2020-11-05 NOTE — Telephone Encounter (Signed)
Spoke with patient on phone and walked through copay card enrollment which I completed on his behalf.  [place] copay card info: BIN: [id number] PCN: [id number] Group: [id number] ID: [id number]  Patient scheduled for Orencia new start on 11/10/20 @ 9am  Knox Saliva, PharmD, MPH, BCPS Clinical Pharmacist (Rheumatology and Pulmonology)

## 2020-11-05 NOTE — Telephone Encounter (Signed)
Patient's PA has been approved for Orencia- patient was at [facility] earlier this week for heart issues and would like to make sure Dr. Benjamine Mola reviews those notes to be sure there will be no issues moving forward with Orencia.

## 2020-11-10 ENCOUNTER — Telehealth: Payer: Self-pay | Admitting: Internal Medicine

## 2020-11-10 ENCOUNTER — Encounter: Payer: Self-pay | Admitting: Pharmacist

## 2020-11-10 ENCOUNTER — Other Ambulatory Visit: Payer: Self-pay

## 2020-11-10 ENCOUNTER — Ambulatory Visit: Payer: Commercial Managed Care - PPO | Admitting: Pharmacist

## 2020-11-10 VITALS — BP 100/70 | HR 130

## 2020-11-10 DIAGNOSIS — M059 Rheumatoid arthritis with rheumatoid factor, unspecified: Secondary | ICD-10-CM

## 2020-11-10 DIAGNOSIS — Z79899 Other long term (current) drug therapy: Secondary | ICD-10-CM

## 2020-11-10 MED ORDER — ORENCIA CLICKJECT 125 MG/ML ~~LOC~~ SOAJ: 125.0000 mg | SUBCUTANEOUS | 0 refills | Status: DC

## 2020-11-10 NOTE — Progress Notes (Addendum)
Pharmacy Note  Subjective:   Patient presents to clinic today to receive first of Orencia for rheumatoid arthritis. He had recent ED admission on 11/01/20 for persistent symptomatic atrial fibrillation and volume overload - he underwent cardioversion. Has significant CV history which prevents use of other biologics including TNF-inhibitors.  He does not have history of COPD/asthma. Does not have history of skin cancer.   Patient running a fever or have signs/symptoms of infection? No  Patient currently on antibiotics for the treatment of infection? No  Patient have any upcoming invasive procedures/surgeries? No - he is seeing cardiologist this Friday, 11/12/20  Objective: CMP     Component Value Date/Time   NA 135 11/04/2020 0344   K 4.1 11/04/2020 0344   CL 99 11/04/2020 0344   CO2 25 11/04/2020 0344   GLUCOSE 108 (H) 11/04/2020 0344   BUN 14 11/04/2020 0344   CREATININE 0.83 11/04/2020 0344   CREATININE 0.78 09/23/2020 1312   CALCIUM 9.1 11/04/2020 0344   PROT 6.6 09/23/2020 1312   AST 14 09/23/2020 1312   ALT 26 09/23/2020 1312   BILITOT 0.6 09/23/2020 1312   GFRNONAA >60 11/04/2020 0344   GFRNONAA 95 09/23/2020 1312   GFRAA 111 09/23/2020 1312    CBC    Component Value Date/Time   WBC 9.6 11/01/2020 1114   RBC 4.27 11/01/2020 1114   HGB 13.5 11/01/2020 1114   HCT 40.5 11/01/2020 1114   PLT 293 11/01/2020 1114   MCV 94.8 11/01/2020 1114   MCH 31.6 11/01/2020 1114   MCHC 33.3 11/01/2020 1114   RDW 14.1 11/01/2020 1114   LYMPHSABS 729 (L) 09/23/2020 1312   MONOABS 0.9 10/31/2015 1042   EOSABS 9 (L) 09/23/2020 1312   BASOSABS 18 09/23/2020 1312    Baseline Immunosuppressant Therapy Labs TB GOLD Quantiferon TB Gold Latest Ref Rng & Units 10/20/2020  Quantiferon TB Gold Plus NEGATIVE NEGATIVE   Hepatitis Panel Hepatitis Latest Ref Rng & Units 07/15/2020  Hep B Surface Ag NON-REACTI NON-REACTIVE  Hep B IgM NON-REACTI NON-REACTIVE  Hep C Ab NON-REACTI NON-REACTIVE   Hep C Ab NON-REACTI NON-REACTIVE  Hep A IgM NON-REACTI NON-REACTIVE   HIV Lab Results  Component Value Date   HIV Non Reactive 10/05/2020   Immunoglobulins Immunoglobulin Electrophoresis Latest Ref Rng & Units 09/23/2020  IgA  70 - 320 mg/dL 224  IgG 600 - 1,540 mg/dL 938  IgM 50 - 300 mg/dL 96   SPEP Serum Protein Electrophoresis Latest Ref Rng & Units 09/23/2020  Total Protein 6.1 - 8.1 g/dL 6.6   Chest x-ray: 11/01/20 - no active cardiopulmonary disease  Assessment/Plan:  We reviewed that there are no interactions with Orencia and his current cardiology med regimen for afib and HF. He also has been advised to hold Orencia and methotrexate if he develops signs or symptoms of infection, antibiotic use, and surgeries. He has been advised to hold if cardiologist wants to perform any surgeries or procedures (he states he is not sure if he will be since nothing is yet planned).  Demonstrated proper injection technique with Orencia demo device  Patient able to demonstrate proper injection technique using the teach back method.  Patient self injected in the left abdomen with:  Sample Medication: Orencia Clickject '125mg'$ /mL NDC: [id number] Lot: [id number] Expiration: 01/2022  Patient tolerated well.  Observed for 30 mins in office for adverse reaction. He has small red spot around injection site but patient had no symptoms. He has been provided information about injection site reaction  management and advised to reach out if he finds anything concerning (hives, bump larger than size of fist, blistering)  Patient is to return in 6 weeks for follow-up appointment when CBC and CMP will be repeat. Standing orders placed.   Orencia approved through insurance .   Rx sent to: [facility]: [phone].  Patient provided with pharmacy phone number and advised to call later this week to schedule shipment to home.  He will continue Orencia SQ '125mg'$  weekly in combination with  methotrexate '15mg'$  once weekly  We rechecked his blood pressure and HR today after about 15 minutes. His BP remained stable. His HR declined to 130 on repeat. He was not symptomatic and was advised to reach out to cardiologist. He has TOC appointment scheduled 11/11/20 and appt with afib clinic's Clint Fenton, PA on 11/12/20. He was advised to check BP/HR at home after he had been sitting and at rest for 15 minutes.  All questions encouraged and answered.  Instructed patient to call with any further questions or concerns.  Knox Saliva, PharmD, MPH, BCPS Clinical Pharmacist (Rheumatology and Pulmonology)  11/10/2020 8:01 AM

## 2020-11-10 NOTE — Patient Instructions (Signed)
Your next Orencia dose is due on 8/17, 8/24, and every 7 days thereafter  CONTINUE methotrexate as prescribed  Your prescription will be shipped from [facility]. Their phone number is [phone] Please call to schedule shipment and confirm address. They will mail your medication to your home.  Your copay should be affordable. If you call the pharmacy and it is not affordable, please double-check that they are billing through your copay card as secondary coverage. That copay card information is: BIN: [id number] PCN: LOYALTY Group: [id number] ID: [id number]  Labs are due in 1 month then every 3 months. Lab hours are from Monday to Thursday 1:30-4:30pm and Friday 1:30-4pm. You do not need an appointment if you come for labs during these times.  How to manage an injection site reaction: Remember the 5 C's: COUNTER - leave on the counter at least 30 minutes but up to overnight to bring medication to room temperature. This may help prevent stinging COLD - place something cold (like an ice gel pack or cold water bottle) on the injection site just before cleansing with alcohol. This may help reduce pain CLARITIN - use Claritin (generic name is loratadine) for the first two weeks of treatment or the day of, the day before, and the day after injecting. This will help to minimize injection site reactions CORTISONE CREAM - apply if injection site is irritated and itching CALL ME - if injection site reaction is bigger than the size of your fist, looks infected, blisters, or if you develop hives

## 2020-11-10 NOTE — Telephone Encounter (Signed)
Spoke with the patient who states that he went to his rheumatologist this morning and was given a new medication for RA (abatacept). He states that they took his HR and it was 160, BP was 102/73. About 5 minutes later they retook BP/ HR which were 100/70 and 132. He states that he is not symptomatic at this time but felt a but lightheaded when he stood up earlier. Patient did take his AM metoprolol. He denies any SOB or swelling.  Patient had a cardioversion 8/1. He is scheduled with A Fib clinic on Friday 8/12.

## 2020-11-10 NOTE — Telephone Encounter (Signed)
  STAT if HR is under 50 or over 120 (normal HR is 60-100 beats per minute)  What is your heart rate? 102/72 HR 160  Do you have a log of your heart rate readings (document readings)? no  Do you have any other symptoms? Dizziness and lightheadness

## 2020-11-11 ENCOUNTER — Telehealth (HOSPITAL_COMMUNITY): Payer: Self-pay

## 2020-11-11 ENCOUNTER — Inpatient Hospital Stay (HOSPITAL_COMMUNITY)
Admit: 2020-11-11 | Discharge: 2020-11-11 | Disposition: A | Discharge status: Home / Self Care | Payer: Commercial Managed Care - PPO

## 2020-11-11 NOTE — Telephone Encounter (Signed)
Patient called to cancel appointment today. He stated that he was at the wrong building but then his wife fell and broke her foot. He will call and reschedule his appointment.

## 2020-11-12 ENCOUNTER — Encounter (HOSPITAL_COMMUNITY): Payer: Self-pay | Admitting: Physician Assistant

## 2020-11-12 ENCOUNTER — Institutional Professional Consult (permissible substitution): Payer: Commercial Managed Care - PPO | Admitting: Internal Medicine

## 2020-11-12 ENCOUNTER — Telehealth: Payer: Self-pay | Admitting: Internal Medicine

## 2020-11-12 ENCOUNTER — Other Ambulatory Visit: Payer: Self-pay

## 2020-11-12 ENCOUNTER — Ambulatory Visit (HOSPITAL_COMMUNITY)
Admission: RE | Admit: 2020-11-12 | Discharge: 2020-11-12 | Disposition: A | Discharge status: Home / Self Care | Payer: Commercial Managed Care - PPO | Source: Ambulatory Visit | Attending: Internal Medicine | Admitting: Internal Medicine

## 2020-11-12 VITALS — BP 150/70 | HR 156 | Ht 77.0 in | Wt 216.4 lb

## 2020-11-12 DIAGNOSIS — F17290 Nicotine dependence, other tobacco product, uncomplicated: Secondary | ICD-10-CM | POA: Insufficient documentation

## 2020-11-12 DIAGNOSIS — I4892 Unspecified atrial flutter: Secondary | ICD-10-CM | POA: Diagnosis present

## 2020-11-12 DIAGNOSIS — Z8249 Family history of ischemic heart disease and other diseases of the circulatory system: Secondary | ICD-10-CM | POA: Insufficient documentation

## 2020-11-12 DIAGNOSIS — Z79899 Other long term (current) drug therapy: Secondary | ICD-10-CM | POA: Insufficient documentation

## 2020-11-12 DIAGNOSIS — I5022 Chronic systolic (congestive) heart failure: Secondary | ICD-10-CM | POA: Insufficient documentation

## 2020-11-12 DIAGNOSIS — Z885 Allergy status to narcotic agent status: Secondary | ICD-10-CM | POA: Diagnosis not present

## 2020-11-12 DIAGNOSIS — I483 Typical atrial flutter: Secondary | ICD-10-CM

## 2020-11-12 DIAGNOSIS — Z880 Allergy status to penicillin: Secondary | ICD-10-CM | POA: Diagnosis not present

## 2020-11-12 DIAGNOSIS — I480 Paroxysmal atrial fibrillation: Secondary | ICD-10-CM | POA: Insufficient documentation

## 2020-11-12 DIAGNOSIS — M069 Rheumatoid arthritis, unspecified: Secondary | ICD-10-CM | POA: Insufficient documentation

## 2020-11-12 DIAGNOSIS — Z7901 Long term (current) use of anticoagulants: Secondary | ICD-10-CM | POA: Insufficient documentation

## 2020-11-12 MED ORDER — METOPROLOL TARTRATE 50 MG PO TABS
50.0000 mg | ORAL_TABLET | Freq: Two times a day (BID) | ORAL | 3 refills | Status: DC
Start: 2020-11-12 — End: 2020-12-28

## 2020-11-12 NOTE — Patient Instructions (Signed)
Increase metoprolol to 50mg twice a day 

## 2020-11-12 NOTE — Telephone Encounter (Signed)
STAT if HR is under 50 or over 120 (normal HR is 60-100 beats per minute)  What is your heart rate? 150  Do you have a log of your heart rate readings (document readings)?   Do you have any other symptoms?   Patient saw the Afib clinic today. He wanted to know if he needs to have his Cardioversion sooner

## 2020-11-12 NOTE — Telephone Encounter (Signed)
Pt seen at [facility] clinic today.  Afib HR 156.  Metoprolol was increased and he returns on Tuesday to see Roderic Palau.  He wants Dr. Quentin Ore to know that he would like to move forward with ablation as soon as possible.  He is aware I will forward to Dr. Quentin Ore and his nurse for following up.

## 2020-11-12 NOTE — Progress Notes (Signed)
Primary Care Physician: Lois Huxley, PA Primary Cardiologist: Dr Johney Frame Primary Electrophysiologist: Dr Quentin Ore  Referring Physician: Dr Dwana Melena Peter Mcmillan is a 65 y.o. male with a history of atrial flutter, atrial fibrillation, systolic dysfunction, RA who presents for follow up in the [facility]. Patient presented to the hospital 10/05/20 with afib RVR and was started on Eliquis for a CHADS2VASC score of 1. He underwent TEE guided DCCV on 10/06/20. He was hospitalized again 11/01/20 for progressive dyspnea and lower extremity edema. He was cardioverted in the ED then admitted for observation and diuresis. He lost almost 30 lbs of fluid. He is back in atrial flutter today but denies any awareness of his arrhythmia. He denies any symptoms of fluid overload.  Today, he denies symptoms of palpitations, chest pain, shortness of breath, orthopnea, PND, lower extremity edema, dizziness, presyncope, syncope, snoring, daytime somnolence, bleeding, or neurologic sequela. The patient is tolerating medications without difficulties and is otherwise without complaint today.    Atrial Fibrillation Risk Factors:  he does not have symptoms or diagnosis of sleep apnea. he does not have a history of rheumatic fever. he does not have a history of alcohol use. The patient does have a history of early familial atrial fibrillation or other arrhythmias. Mother has afib.  he has a BMI of Body mass index is 25.66 kg/m.Marland Kitchen Filed Weights   11/12/20 0922  Weight: 98.2 kg    Family History  Problem Relation Age of Onset   Heart attack Father    Heart attack Mother    Dementia Mother    Healthy Sister    Healthy Son      Atrial Fibrillation Management history:  Previous antiarrhythmic drugs: none Previous cardioversions: 10/06/20, 11/01/20 Previous ablations: none CHADS2VASC score: 1 Anticoagulation history: Eliquis   Past Medical History:  Diagnosis Date    Atrial flutter ([place])    Erectile dysfunction    Rheumatoid arthritis ([place])    Tobacco abuse    Smokeless tobacco   Past Surgical History:  Procedure Laterality Date   BACK SURGERY  1999   CARDIOVERSION N/A 10/06/2020   Procedure: CARDIOVERSION;  Surgeon: Donato Heinz, MD;  Location: [facility];  Service: Cardiovascular;  Laterality: N/A;   MELANOMA EXCISION  2020   TEE WITHOUT CARDIOVERSION N/A 10/06/2020   Procedure: TRANSESOPHAGEAL ECHOCARDIOGRAM (TEE);  Surgeon: Donato Heinz, MD;  Location: [facility];  Service: Cardiovascular;  Laterality: N/A;    Current Outpatient Medications  Medication Sig Dispense Refill   Abatacept (ORENCIA CLICKJECT) 0000000 MG/ML SOAJ Inject 125 mg into the skin once a week. 12 mL 0   apixaban (ELIQUIS) 5 MG TABS tablet Take 1 tablet (5 mg total) by mouth 2 (two) times daily. 60 tablet 11   cholecalciferol (VITAMIN D3) 25 MCG (1000 UNIT) tablet Take 1,000 Units by mouth daily.     folic acid (FOLVITE) 1 MG tablet Take 1 tablet (1 mg total) by mouth daily. 90 tablet 0   furosemide (LASIX) 20 MG tablet Take 1 tablet (20 mg total) by mouth daily. 30 tablet 6   loratadine (CLARITIN) 10 MG tablet Take 10 mg by mouth as needed for allergies.     methotrexate (RHEUMATREX) 2.5 MG tablet Take 15 mg by mouth once a week. Takes on Sunday.     omeprazole (PRILOSEC) 40 MG capsule Take 40 mg by mouth daily.     predniSONE (DELTASONE) 10 MG tablet Take 2 tablets (20 mg total)  by mouth daily. (Patient taking differently: Take 10 mg by mouth daily.) 60 tablet 0   sacubitril-valsartan (ENTRESTO) 24-26 MG Take 1 tablet by mouth 2 (two) times daily. 60 tablet 0   metoprolol tartrate (LOPRESSOR) 50 MG tablet Take 1 tablet (50 mg total) by mouth 2 (two) times daily. 60 tablet 3   No current facility-administered medications for this encounter.    Allergies  Allergen Reactions   Codeine Other (See Comments)    agitation   Other Hives, Swelling and Other  (See Comments)    make feel weird; hives/swelling   Penicillin G Other (See Comments)   Penicillins Itching, Swelling and Rash    Social History   Socioeconomic History   Marital status: Married    Spouse name: Not on file   Number of children: 1   Years of education: Not on file   Highest education level: Not on file  Occupational History   Occupation: Clinical biochemist  Tobacco Use   Smoking status: Never   Smokeless tobacco: Current    Types: Snuff  Vaping Use   Vaping Use: Never used  Substance and Sexual Activity   Alcohol use: Not Currently    Comment: Rare(nothing in 2 weeks 11/12/20)   Drug use: No   Sexual activity: Not on file  Other Topics Concern   Not on file  Social History Narrative   Not on file   Social Determinants of Health   Financial Resource Strain: Not on file  Food Insecurity: Not on file  Transportation Needs: Not on file  Physical Activity: Not on file  Stress: Not on file  Social Connections: Not on file  Intimate Partner Violence: Not on file     ROS- All systems are reviewed and negative except as per the HPI above.  Physical Exam: Vitals:   11/12/20 0922  BP: (!) 150/70  Pulse: (!) 156  Weight: 98.2 kg  Height: '6\' 5"'$  (1.956 m)    GEN- The patient is a well appearing male, alert and oriented x 3 today.   Head- normocephalic, atraumatic Eyes-  Sclera clear, conjunctiva pink Ears- hearing intact Oropharynx- clear Neck- supple  Lungs- Clear to ausculation bilaterally, normal work of breathing Heart- Regular rate and rhythm, tachycardia, no murmurs, rubs or gallops  GI- soft, NT, ND, + BS Extremities- no clubbing, cyanosis, or edema MS- no significant deformity or atrophy Skin- no rash or lesion Psych- euthymic mood, full affect Neuro- strength and sensation are intact  Wt Readings from Last 3 Encounters:  11/12/20 98.2 kg  11/04/20 93.6 kg  11/01/20 106.6 kg    EKG today demonstrates  Atrial flutter with 2:1  conduction Vent. rate 156 BPM PR interval * ms QRS duration 76 ms QT/QTcB 316/509 ms  TEE 10/06/20 demonstrated  1. Left ventricular ejection fraction, by estimation, is 40 to 45%. The  left ventricle has mildly decreased function. The left ventricle  demonstrates global hypokinesis.   2. Right ventricular systolic function is normal. The right ventricular  size is mildly enlarged.   3. Left atrial size was mildly dilated. No left atrial/left atrial  appendage thrombus was detected.   4. The mitral valve is normal in structure. Mild mitral valve  regurgitation.   5. The aortic valve is tricuspid. Aortic valve regurgitation is not  visualized.   Conclusion(s)/Recommendation(s): No LA/LAA thrombus identified. Successful  cardioversion performed with restoration of normal sinus rhythm.   Epic records are reviewed at length today  CHA2DS2-VASc Score = 1  The patient's score is based upon: CHF History: Yes HTN History: No Diabetes History: No Stroke History: No Vascular Disease History: No Age Score: 0 Gender Score: 0      ASSESSMENT AND PLAN: 1. Paroxysmal Atrial Fibrillation/atrial flutter The patient's CHA2DS2-VASc score is 1, indicating a 0.6% annual risk of stroke.   Patient back in rapid atrial flutter today. We discussed therapeutic options today.  Will increase metoprolol to 50 mg BID. If he does not convert and is not rate controlled, may need DCCV again. Ultimately, the plan is for ablation with Dr Quentin Ore.  Continue Eliquis 5 mg BID  2. Chronic systolic CHF EF [id number] No signs or symptoms of fluid overload today. Will be important for him to maintain SR.   Follow up in the AF clinic next week.    [facility] [address] [place], [place] [address] [phone] 11/12/2020 12:13 PM

## 2020-11-14 ENCOUNTER — Other Ambulatory Visit: Payer: Self-pay

## 2020-11-14 ENCOUNTER — Encounter (HOSPITAL_COMMUNITY): Payer: Self-pay

## 2020-11-14 ENCOUNTER — Telehealth: Payer: Self-pay | Admitting: Student

## 2020-11-14 ENCOUNTER — Emergency Department (HOSPITAL_COMMUNITY)
Admission: EM | Admit: 2020-11-14 | Discharge: 2020-11-14 | Disposition: A | Discharge status: Home / Self Care | Payer: Commercial Managed Care - PPO | Attending: Emergency Medicine | Admitting: Emergency Medicine

## 2020-11-14 DIAGNOSIS — I4892 Unspecified atrial flutter: Secondary | ICD-10-CM | POA: Diagnosis not present

## 2020-11-14 DIAGNOSIS — Z7901 Long term (current) use of anticoagulants: Secondary | ICD-10-CM | POA: Diagnosis not present

## 2020-11-14 DIAGNOSIS — Z79899 Other long term (current) drug therapy: Secondary | ICD-10-CM | POA: Insufficient documentation

## 2020-11-14 DIAGNOSIS — I10 Essential (primary) hypertension: Secondary | ICD-10-CM | POA: Diagnosis not present

## 2020-11-14 DIAGNOSIS — F17290 Nicotine dependence, other tobacco product, uncomplicated: Secondary | ICD-10-CM | POA: Diagnosis not present

## 2020-11-14 DIAGNOSIS — R002 Palpitations: Secondary | ICD-10-CM | POA: Diagnosis present

## 2020-11-14 DIAGNOSIS — Z85820 Personal history of malignant melanoma of skin: Secondary | ICD-10-CM | POA: Insufficient documentation

## 2020-11-14 LAB — CBC WITH DIFFERENTIAL/PLATELET
Abs Immature Granulocytes: 0.11 K/uL — ABNORMAL HIGH (ref 0.00–0.07)
Basophils Absolute: 0.1 K/uL (ref 0.0–0.1)
Basophils Relative: 1 %
Eosinophils Absolute: 0.1 K/uL (ref 0.0–0.5)
Eosinophils Relative: 1 %
HCT: 50.3 % (ref 39.0–52.0)
Hemoglobin: 16.5 g/dL (ref 13.0–17.0)
Immature Granulocytes: 1 %
Lymphocytes Relative: 9 %
Lymphs Abs: 1.1 K/uL (ref 0.7–4.0)
MCH: 31.2 pg (ref 26.0–34.0)
MCHC: 32.8 g/dL (ref 30.0–36.0)
MCV: 95.1 fL (ref 80.0–100.0)
Monocytes Absolute: 0.9 K/uL (ref 0.1–1.0)
Monocytes Relative: 8 %
Neutro Abs: 9.9 K/uL — ABNORMAL HIGH (ref 1.7–7.7)
Neutrophils Relative %: 80 %
Platelets: 359 K/uL (ref 150–400)
RBC: 5.29 MIL/uL (ref 4.22–5.81)
RDW: 13.7 % (ref 11.5–15.5)
WBC: 12.1 K/uL — ABNORMAL HIGH (ref 4.0–10.5)
nRBC: 0 % (ref 0.0–0.2)

## 2020-11-14 LAB — BASIC METABOLIC PANEL
Anion gap: 8 (ref 5–15)
BUN: 16 mg/dL (ref 8–23)
CO2: 26 mmol/L (ref 22–32)
Calcium: 9.4 mg/dL (ref 8.9–10.3)
Chloride: 107 mmol/L (ref 98–111)
Creatinine, Ser: 0.99 mg/dL (ref 0.61–1.24)
GFR, Estimated: >60 mL/min (ref >60)
Glucose, Bld: 96 mg/dL (ref 70–99)
Potassium: 3.9 mmol/L (ref 3.5–5.1)
Sodium: 141 mmol/L (ref 135–145)

## 2020-11-14 MED ORDER — METOPROLOL TARTRATE 5 MG/5ML IV SOLN
5.0000 mg | Freq: Once | INTRAVENOUS | Status: AC
  Administered 2020-11-14: 5 mg via INTRAVENOUS
  Filled 2020-11-14: qty 5

## 2020-11-14 MED ORDER — FENTANYL CITRATE (PF) 100 MCG/2ML IJ SOLN
INTRAMUSCULAR | Status: AC | PRN
  Administered 2020-11-14: 100 ug via INTRAVENOUS

## 2020-11-14 MED ORDER — SODIUM CHLORIDE 0.9 % IV BOLUS
500.0000 mL | Freq: Once | INTRAVENOUS | Status: AC
  Administered 2020-11-14: 500 mL via INTRAVENOUS

## 2020-11-14 MED ORDER — ETOMIDATE 2 MG/ML IV SOLN
0.1500 mg/kg | Freq: Once | INTRAVENOUS | Status: AC
  Administered 2020-11-14: 14.42 mg via INTRAVENOUS
  Filled 2020-11-14: qty 10

## 2020-11-14 MED ORDER — FENTANYL CITRATE (PF) 100 MCG/2ML IJ SOLN
100.0000 ug | Freq: Once | INTRAMUSCULAR | Status: AC
  Administered 2020-11-14: 100 ug via INTRAVENOUS
  Filled 2020-11-14: qty 2

## 2020-11-14 MED ORDER — ETOMIDATE 2 MG/ML IV SOLN
INTRAVENOUS | Status: AC | PRN
Start: 2020-11-14 — End: 2020-11-14
  Administered 2020-11-14: 14.42 mg via INTRAVENOUS

## 2020-11-14 NOTE — Discharge Instructions (Addendum)
Follow-up in the atrial fibrillation clinic on Tuesday as scheduled.  Take your medications as directed.

## 2020-11-14 NOTE — ED Triage Notes (Signed)
Call from cardiology reporting afib with rvr since Friday. Hx of same.

## 2020-11-14 NOTE — Telephone Encounter (Signed)
    The patient called the after-hours line reporting persistent tachycardia since Friday with his heart rate averaging in the 150's but variable from the 130's to 160's. He reports he can feel palpitations in his neck and has started to feel more dizzy and short of breath. He does have follow-up with the [facility] later this week to discuss possible repeat DCCV and potential ablation in the future. Given his worsening symptoms and persistent tachycardia, I did recommend he go to the Emergency Department for evaluation. He would likely be a candidate for DCCV while in the ED given that he has not missed any doses of Eliquis per his report.  He prefers to go to [facility] ED and I did speak with the Triage Nurse to make her aware of his upcoming arrival.   Arna Medici, PA-C 11/14/2020, 12:20 PM Pager: [phone]

## 2020-11-14 NOTE — ED Provider Notes (Signed)
[facility] DEPT Provider Note   CSN: [id number] Arrival date & time: 11/14/20  1248     History Chief Complaint  Patient presents with   Palpitations    Peter Mcmillan is a 65 y.o. male.  HPI He presents for evaluation of palpitations, tachycardia, dizziness and hypotension.  Onset of symptoms around 2 AM.  After that, he was able to sleep.  He woke this morning, took his medicines, ate, then talk to an on-call cardiologist, who recommended that he come here.  He denies chest pain, fever, chills, nausea or vomiting.  Has been taking his usual medicines which include anticoagulant.  He is evaluated in the atrial fibrillation clinic, 2 days ago, for atrial flutter.  At time he had a heart rate of 156, with 2-1 block no other recent illnesses.  There are no other known modifying factors.    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   High risk medication use 07/15/2020   Allergic rhinitis due to pollen 06/11/2020   ED (erectile dysfunction) of organic origin 06/11/2020   Essential hypertension 06/11/2020   Gastro-esophageal reflux disease without esophagitis 06/11/2020   Hyperlipidemia 06/11/2020   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   Prediabetes 06/11/2020   Tobacco user 06/11/2020   Vitamin D deficiency 06/11/2020   Bilateral hand pain 06/11/2020   Bilateral shoulder pain 06/11/2020   Atrial flutter ([place]) 12/09/2013   PULMONARY NODULE 08/18/2008   COUGH 08/18/2008    Past Surgical History:  Procedure Laterality Date   Mesic N/A 10/06/2020   Procedure: CARDIOVERSION;   Surgeon: Donato Heinz, MD;  Location: [place];  Service: Cardiovascular;  Laterality: N/A;   MELANOMA EXCISION  2020   TEE WITHOUT CARDIOVERSION N/A 10/06/2020   Procedure: TRANSESOPHAGEAL ECHOCARDIOGRAM (TEE);  Surgeon: Donato Heinz, MD;  Location: [facility] ENDOSCOPY;  Service: Cardiovascular;  Laterality: N/A;       Family History  Problem Relation Age of Onset   Heart attack Father    Heart attack Mother    Dementia Mother    Healthy Sister    Healthy Son     Social History   Tobacco Use   Smoking status: Never   Smokeless tobacco: Current    Types: Snuff  Vaping Use   Vaping Use: Never used  Substance Use Topics   Alcohol use: Not Currently    Comment: Rare(nothing in 2 weeks 11/12/20)   Drug use: No    Home Medications Prior to Admission medications   Medication Sig Start Date End Date Taking? Authorizing Provider  Abatacept (ORENCIA CLICKJECT) 0000000 MG/ML SOAJ Inject 125 mg into the skin once a week. 11/10/20  Yes Rice, Resa Miner, MD  apixaban (ELIQUIS) 5 MG TABS tablet Take 1 tablet (5 mg total) by mouth 2 (two) times daily. 10/06/20  Yes Barrett, Evelene Croon, PA-C  cholecalciferol (VITAMIN D3) 25 MCG (1000 UNIT) tablet Take 1,000 Units by mouth daily.   Yes [provider]  folic acid (FOLVITE) 1 MG tablet Take 1 tablet (1 mg total) by mouth daily. 09/23/20  Yes Rice, Resa Miner,  MD  furosemide (LASIX) 20 MG tablet Take 1 tablet (20 mg total) by mouth daily. 11/05/20  Yes Baldwin Jamaica, PA-C  loratadine (CLARITIN) 10 MG tablet Take 10 mg by mouth as needed for allergies.   Yes [provider]  methotrexate (RHEUMATREX) 2.5 MG tablet Take 15 mg by mouth once a week. Takes on Sunday.   Yes [provider]  metoprolol tartrate (LOPRESSOR) 50 MG tablet Take 1 tablet (50 mg total) by mouth 2 (two) times daily. 11/12/20  Yes Fenton, Clint R, PA  predniSONE (DELTASONE) 10 MG tablet Take 2 tablets (20 mg total) by mouth  daily. Patient taking differently: Take 10 mg by mouth daily. 09/23/20  Yes Rice, Resa Miner, MD  sacubitril-valsartan (ENTRESTO) 24-26 MG Take 1 tablet by mouth 2 (two) times daily. 11/04/20  Yes Baldwin Jamaica, PA-C    Allergies    Codeine, Other, Penicillin g, and Penicillins  Review of Systems   Review of Systems  Physical Exam Updated Vital Signs BP 114/86   Pulse 82   Temp 97.8 F (36.6 C)   Resp 17   Ht '6\' 5"'$  (1.956 m)   Wt 96.1 kg   SpO2 100%   BMI 25.13 kg/m   Physical Exam  ED Results / Procedures / Treatments   Labs (all labs ordered are listed, but only abnormal results are displayed) Labs Reviewed  CBC WITH DIFFERENTIAL/PLATELET - Abnormal; Notable for the following components:      Result Value   WBC 12.1 (*)    Neutro Abs 9.9 (*)    Abs Immature Granulocytes 0.11 (*)    All other components within normal limits  BASIC METABOLIC PANEL    EKG EKG Interpretation  Date/Time:  Sunday November 14 2020 15:29:31 EDT Ventricular Rate:  89 PR Interval:  143 QRS Duration: 88 QT Interval:  344 QTC Calculation: 419 R Axis:   16 Text Interpretation: Sinus arrhythmia Ventricular premature complex Abnormal R-wave progression, early transition Since last tracing at 12:53 PM today, now in sinus rhythm and QTC now normal. Confirmed by Daleen Bo [phone]) on 11/14/2020 3:46:11 PM   Date: 11/14/20- 12:53 PM  Rate: 157  Rhythm: Atrial flutter with 2-1 block  QRS Axis: normal  PR and QT Intervals: QTC prolonged  ST/T Wave abnormalities: normal  PR and QRS Conduction Disutrbances:none  Narrative Interpretation:     Radiology No results found.  Procedures .Sedation  Date/Time: 11/15/2020 10:58 AM Performed by: Daleen Bo, MD Authorized by: Daleen Bo, MD   Consent:    Consent obtained:  Written   Consent given by:  Patient   Risks discussed:  Prolonged hypoxia resulting in organ damage   Alternatives discussed:  Analgesia without  sedation Universal protocol:    Immediately prior to procedure, a time out was called: yes     Patient identity confirmed:  Verbally with patient Indications:    Procedure performed:  Cardioversion Pre-sedation assessment:    Time since last food or drink:  4 hr   ASA classification: class 3 - patient with severe systemic disease     Mallampati score:  III - soft palate, base of uvula visible   Neck mobility: normal     Pre-sedation assessments completed and reviewed: airway patency, cardiovascular function, hydration status, mental status and respiratory function     Pre-sedation assessments completed and reviewed: pre-procedure nausea and vomiting status not reviewed and pre-procedure pain level not reviewed   Immediate pre-procedure details:    Reassessment: Patient  reassessed immediately prior to procedure     Reviewed: vital signs, relevant labs/tests and NPO status     Verified: bag valve mask available, emergency equipment available and intubation equipment available   Procedure details (see MAR for exact dosages):    Sedation:  Etomidate   Intended level of sedation: deep   Analgesia:  Fentanyl   Intra-procedure monitoring:  Blood pressure monitoring, cardiac monitor, continuous pulse oximetry, continuous capnometry and frequent LOC assessments   Intra-procedure events: none     Total Provider sedation time (minutes):  25 Post-procedure details:    Post-sedation assessment completed:  11/14/2020 3:50 PM   Attendance: Constant attendance by certified staff until patient recovered     Recovery: Patient returned to pre-procedure baseline     Post-sedation assessments completed and reviewed: airway patency, cardiovascular function, hydration status, mental status and respiratory function     Post-sedation assessments completed and reviewed: post-procedure nausea and vomiting status not reviewed and pain score not reviewed     Patient is stable for discharge or admission: yes      Procedure completion:  Tolerated well, no immediate complications .Cardioversion  Date/Time: 11/15/2020 11:02 AM Performed by: Daleen Bo, MD Authorized by: Daleen Bo, MD   Consent:    Consent obtained:  Written   Consent given by:  Patient   Risks discussed:  Cutaneous burn   Alternatives discussed:  No treatment Pre-procedure details:    Cardioversion basis:  Elective   Rhythm:  Atrial flutter   Electrode placement:  Anterior-posterior Patient sedated: Yes. Refer to sedation procedure documentation for details of sedation.  Attempt one:    Cardioversion mode:  Synchronous   Waveform:  Biphasic   Shock (Joules):  120   Shock outcome:  Conversion to normal sinus rhythm Post-procedure details:    Patient status:  Awake   Patient tolerance of procedure:  Tolerated well, no immediate complications .Critical Care  Date/Time: 11/15/2020 11:08 AM Performed by: Daleen Bo, MD Authorized by: Daleen Bo, MD   Critical care provider statement:    Critical care time (minutes):  65   Critical care start time:  11/14/2020 12:50 PM   Critical care end time:  11/14/2020 4:30 PM   Critical care time was exclusive of:  Separately billable procedures and treating other patients   Critical care was time spent personally by me on the following activities:  Blood draw for specimens, development of treatment plan with patient or surrogate, discussions with consultants, evaluation of patient's response to treatment, examination of patient, obtaining history from patient or surrogate, ordering and performing treatments and interventions, ordering and review of laboratory studies, pulse oximetry, re-evaluation of patient's condition, review of old charts and ordering and review of radiographic studies   Medications Ordered in ED Medications  sodium chloride 0.9 % bolus 500 mL (0 mLs Intravenous Stopped 11/14/20 1504)  metoprolol tartrate (LOPRESSOR) injection 5 mg (5 mg Intravenous Given  11/14/20 1310)  etomidate (AMIDATE) injection 14.42 mg (14.42 mg Intravenous Given 11/14/20 1524)  fentaNYL (SUBLIMAZE) injection 100 mcg (100 mcg Intravenous Given 11/14/20 1526)  fentaNYL (SUBLIMAZE) injection (100 mcg Intravenous Given 11/14/20 1524)  etomidate (AMIDATE) injection (14.42 mg Intravenous Given 11/14/20 1526)    ED Course  I have reviewed the triage vital signs and the nursing notes.  Pertinent labs & imaging results that were available during my care of the patient were reviewed by me and considered in my medical decision making (see chart for details).    MDM Rules/Calculators/A&P  Patient Vitals for the past 24 hrs:  BP Temp Temp src Pulse Resp SpO2 Height Weight  11/14/20 1645 114/86 -- -- 82 17 100 % -- --  11/14/20 1630 109/84 -- -- 82 16 100 % -- --  11/14/20 1615 108/88 -- -- 65 17 100 % -- --  11/14/20 1600 104/79 -- -- 79 16 99 % -- --  11/14/20 1545 110/80 -- -- 71 16 99 % -- --  11/14/20 1530 115/89 -- -- 80 14 90 % -- --  11/14/20 1515 (!) 119/98 -- -- (!) 156 -- 96 % -- --  11/14/20 1500 (!) 127/102 -- -- (!) 157 -- 96 % -- --  11/14/20 1450 117/74 -- -- (!) 158 -- 97 % -- --  11/14/20 1430 (!) 125/100 97.8 F (36.6 C) -- (!) 158 (!) 22 97 % -- --  11/14/20 1400 119/90 -- -- (!) 155 -- 99 % -- --  11/14/20 1330 (!) 127/96 -- -- (!) 156 (!) 29 97 % -- --  11/14/20 1300 (!) 121/91 -- -- (!) 159 14 99 % '6\' 5"'$  (1.956 m) 96.1 kg  11/14/20 1258 (!) 139/95 97.7 F (36.5 C) Oral (!) 157 18 99 % -- --    4:27 PM Reevaluation with update and discussion. After initial assessment and treatment, an updated evaluation reveals he continues to be in normal sinus rhythm at 71 now.  5 discussed with the patient all questions were answered. Daleen Bo   Medical Decision Making:  This patient is presenting for evaluation of persistent tachycardia with atrial flutter, which does require a range of treatment options, and is a complaint that  involves a high risk of morbidity and mortality. The differential diagnoses include atrial fibrillation, versus atrial flutter, metabolic disorder, hemodynamic disorder. I decided to review old records, and in summary elderly male, followed closely by cardiology for atrial flutter, recently had metoprolol dose increased without resolution of his rapid atrial flutter.  He has had previous cardioversions x3, and is apparently a candidate for ablation.  He presents uncomfortable requiring stabilization, due to low blood pressure.  I did not require additional historical information from anyone.  Clinical Laboratory Tests Ordered, included CBC and Metabolic panel. Review indicates normal except mild elevation of white count, which is nonspecific, in this setting.   Cardiac Monitor Tracing which shows rapid atrial flutter   Critical Interventions-clinical evaluation, laboratory testing, cardiac monitor, EKG, attempt to stabilize heart rate with IV fluids, and Lopressor.  Persistent tachycardia with low blood pressure, required cardioversion.  Sedation required for cardioversion.  After These Interventions, the Patient was reevaluated and was found converted to normal sinus rhythm, with improved blood pressure and stable cardiac status after period of monitoring.  He will be discharged on same treatment, with close follow-up planned for atrial fibrillation clinic, in 2 days.  CRITICAL CARE-yes Performed by: Daleen Bo  Nursing Notes Reviewed/ Care Coordinated Applicable Imaging Reviewed Interpretation of Laboratory Data incorporated into ED treatment  The patient appears reasonably screened and/or stabilized for discharge and I doubt any other medical condition or other [facility] requiring further screening, evaluation, or treatment in the ED at this time prior to discharge.  Plan: Home Medications-continue usual; Home Treatments-regular diet and activity; return here if the recommended treatment,  does not improve the symptoms; Recommended follow up-cardiology in atrial fibrillation clinic in 2 days     Final Clinical Impression(s) / ED Diagnoses Final diagnoses:  Atrial flutter with rapid ventricular response ([place])  Rx / DC Orders ED Discharge Orders     None        Daleen Bo, MD 11/15/20 1109

## 2020-11-15 ENCOUNTER — Encounter (HOSPITAL_COMMUNITY): Payer: Self-pay

## 2020-11-15 ENCOUNTER — Telehealth (HOSPITAL_COMMUNITY): Payer: Self-pay

## 2020-11-15 NOTE — Telephone Encounter (Signed)
Called to confirm Heart & Vascular Transitions of Care appointment at 8/16 @ 10AM. Changed appt from 2pm to 10AM as pt has AF clinic appt at 0915.   Patient reminded to bring all medications and pill box organizer with them. Confirmed patient has transportation. Gave directions, instructed to utilize [place] parking.Completed [facility] via phone, pt voiced concern regarding finances (monthly bills, mortgage paused for 1 mo per pt) spouse recently broke foot--surgery scheduled for Friday. 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    Confirmed appointment prior to ending call.    Pricilla Holm, RN, BSN Heart Failure Nurse Navigator [phone]

## 2020-11-16 ENCOUNTER — Ambulatory Visit (HOSPITAL_COMMUNITY)
Admission: RE | Admit: 2020-11-16 | Discharge: 2020-11-16 | Disposition: A | Discharge status: Home / Self Care | Payer: Commercial Managed Care - PPO | Source: Ambulatory Visit | Attending: Nurse Practitioner | Admitting: Nurse Practitioner

## 2020-11-16 ENCOUNTER — Encounter (HOSPITAL_COMMUNITY): Payer: Self-pay

## 2020-11-16 ENCOUNTER — Encounter (HOSPITAL_COMMUNITY): Payer: Self-pay | Admitting: Nurse Practitioner

## 2020-11-16 ENCOUNTER — Encounter (HOSPITAL_COMMUNITY): Payer: Commercial Managed Care - PPO

## 2020-11-16 ENCOUNTER — Other Ambulatory Visit: Payer: Self-pay

## 2020-11-16 ENCOUNTER — Ambulatory Visit (HOSPITAL_BASED_OUTPATIENT_CLINIC_OR_DEPARTMENT_OTHER)
Admission: RE | Admit: 2020-11-16 | Discharge: 2020-11-16 | Disposition: A | Discharge status: Home / Self Care | Payer: Commercial Managed Care - PPO | Source: Ambulatory Visit | Attending: Nurse Practitioner | Admitting: Nurse Practitioner

## 2020-11-16 VITALS — BP 114/70 | HR 71 | Ht 77.0 in | Wt 214.0 lb

## 2020-11-16 VITALS — BP 108/64 | HR 78 | Wt 214.0 lb

## 2020-11-16 DIAGNOSIS — I4892 Unspecified atrial flutter: Secondary | ICD-10-CM

## 2020-11-16 DIAGNOSIS — Z8249 Family history of ischemic heart disease and other diseases of the circulatory system: Secondary | ICD-10-CM | POA: Diagnosis not present

## 2020-11-16 DIAGNOSIS — I483 Typical atrial flutter: Secondary | ICD-10-CM | POA: Diagnosis not present

## 2020-11-16 DIAGNOSIS — D6869 Other thrombophilia: Secondary | ICD-10-CM | POA: Diagnosis not present

## 2020-11-16 DIAGNOSIS — E7849 Other hyperlipidemia: Secondary | ICD-10-CM | POA: Diagnosis not present

## 2020-11-16 DIAGNOSIS — I5022 Chronic systolic (congestive) heart failure: Secondary | ICD-10-CM | POA: Diagnosis not present

## 2020-11-16 DIAGNOSIS — I5020 Unspecified systolic (congestive) heart failure: Secondary | ICD-10-CM | POA: Insufficient documentation

## 2020-11-16 DIAGNOSIS — Z79899 Other long term (current) drug therapy: Secondary | ICD-10-CM | POA: Diagnosis not present

## 2020-11-16 DIAGNOSIS — R000 Tachycardia, unspecified: Secondary | ICD-10-CM | POA: Diagnosis not present

## 2020-11-16 DIAGNOSIS — Z7901 Long term (current) use of anticoagulants: Secondary | ICD-10-CM | POA: Insufficient documentation

## 2020-11-16 DIAGNOSIS — I480 Paroxysmal atrial fibrillation: Secondary | ICD-10-CM | POA: Diagnosis not present

## 2020-11-16 DIAGNOSIS — I428 Other cardiomyopathies: Secondary | ICD-10-CM | POA: Insufficient documentation

## 2020-11-16 MED ORDER — SPIRONOLACTONE 25 MG PO TABS: 12.5000 mg | ORAL_TABLET | Freq: Every day | ORAL | 1 refills | Status: DC

## 2020-11-16 MED ORDER — FUROSEMIDE 20 MG PO TABS: 20.0000 mg | ORAL_TABLET | Freq: Every day | ORAL | 6 refills | Status: DC | PRN

## 2020-11-16 NOTE — Progress Notes (Signed)
[facility] Transitions of Care Clinic  [id number], [place] Primary Cardiologist:Pemberton, heather   HPI:   Peter Mcmillan is a 65 y.o.  male  with a PMH significant for RA, smoker, Aflutter, tobacco use, recently diagnosed systolic dysfunction likely tachymediated   Initially diagnosed A flutter with R[facility] at [facility].  He converted on his own.  2015 ECHO EF 55%, CHADS2vasc 0 on asa   Established with [facility] afterwards 12/2013 and managed outpatient, initially on diltiazem   Seen 2017 with aflutter with RVR in the ED.  Dr. Lovena Le EP gave flecainide, cardioverted him and arranged FU outpatient.     10/05/20 admitted with AF with RVR, underwent TEE/DCCV, metoprolol added to his chronic diltiazem.  ECHO with new systolic dysfunction EF [id number]   One month later 11/01/2020 seen by EP outpatient in afib w/rvr and signs of volume overload, admitted cardioverted again,   diuresed 30lbs of fluid off, switched to metoprolol from diltiazem given new HFrEF.  Also had ischemic eval by coronary CTA which demonstrated no CAD.  Discharged with plans to follow up outpatient for ablation consideration.    Seen for FU 11/12/20 back in rapid Aflutter rate 160, metoprolol tartrate increased to '50mg'$  BID.  Continued feeling poorly so went to the ED 11/14/20 was cardioverted in the ED.  Feeling better but still a little drained after cardioversion.  Has been taking it easy since then.  Watching his weight closely it is stable around 112lbs.  He is getting frustrated with multiple visits, continued afib, medication changes/additions.    ROS: All systems negative except as listed in HPI, PMH and Problem List.  SH:  Social History   Socioeconomic History   Marital status: Married    Spouse name: Not on file   Number of children: 1   Years of education: Not on file   Highest education level: Associate degree: occupational, Hotel manager, or vocational program  Occupational History   Occupation:  Clinical biochemist  Tobacco Use   Smoking status: Never   Smokeless tobacco: Current    Types: Snuff   Tobacco comments:    Not ready to quit snuff completely, but states has cut back since hospitalization.   Vaping Use   Vaping Use: Never used  Substance and Sexual Activity   Alcohol use: Not Currently    Comment: Rare(nothing in 2 weeks 11/12/20)   Drug use: No   Sexual activity: Not on file  Other Topics Concern   Not on file  Social History Narrative   Not on file   Social Determinants of Health   Financial Resource Strain: High Risk   Difficulty of Paying Living Expenses: Hard  Food Insecurity: No Food Insecurity   Worried About Running Out of Food in the Last Year: Never true   Ran Out of Food in the Last Year: Never true  Transportation Needs: No Transportation Needs   Lack of Transportation (Medical): No   Lack of Transportation (Non-Medical): No  Physical Activity: Not on file  Stress: Not on file  Social Connections: Not on file  Intimate Partner Violence: Not on file    FH:  Family History  Problem Relation Age of Onset   Heart attack Father    Heart attack Mother    Dementia Mother    Healthy Sister    Healthy Son     Past Medical History:  Diagnosis Date   Atrial flutter [facility])    Erectile dysfunction    Rheumatoid arthritis ([place])  Tobacco abuse    Smokeless tobacco    Current Outpatient Medications  Medication Sig Dispense Refill   Abatacept (ORENCIA CLICKJECT) 0000000 MG/ML SOAJ Inject 125 mg into the skin once a week. 12 mL 0   apixaban (ELIQUIS) 5 MG TABS tablet Take 1 tablet (5 mg total) by mouth 2 (two) times daily. 60 tablet 11   cholecalciferol (VITAMIN D3) 25 MCG (1000 UNIT) tablet Take 1,000 Units by mouth daily.     folic acid (FOLVITE) 1 MG tablet Take 1 tablet (1 mg total) by mouth daily. 90 tablet 0   furosemide (LASIX) 20 MG tablet Take 1 tablet (20 mg total) by mouth daily. 30 tablet 6   loratadine (CLARITIN) 10 MG tablet Take 10 mg by  mouth as needed for allergies.     methotrexate (RHEUMATREX) 2.5 MG tablet Take 15 mg by mouth once a week. Takes on Sunday.     metoprolol tartrate (LOPRESSOR) 50 MG tablet Take 1 tablet (50 mg total) by mouth 2 (two) times daily. 60 tablet 3   predniSONE (DELTASONE) 10 MG tablet Take 10 mg by mouth daily with breakfast.     sacubitril-valsartan (ENTRESTO) 24-26 MG Take 1 tablet by mouth 2 (two) times daily. 60 tablet 0   No current facility-administered medications for this encounter.    Vitals:   11/16/20 0942  BP: 108/64  Pulse: 78  SpO2: 98%  Weight: 97.1 kg (214 lb)    PHYSICAL EXAM: Cardiac: JVD flat, normal rate and rhythm, clear s1 and s2, no murmurs, rubs or gallops, no LE edema Pulmonary: CTAB, not in distress Abdominal: non distended abdomen, soft and nontender Psych: Alert, conversant, in good spirits    ECG  NSR rate 71, PAC's  ASSESSMENT & PLAN: HFrEF NICM -Almost certainly tachy mediated -2015 ECHO EF 55% -10/05/20 admitted with AF with RVR, underwent TEE/DCCV, metoprolol added to his chronic diltiazem.  ECHO with new systolic dysfunction EF [id number] -8/22 coronary CTA which demonstrated no CAD -NYHA Class II, dry on examination -Continue Entresto 24/26, Toprol Tartrate 50 BID  -Add spiro 12.5 daily -switch lasix to 65mdaily PRN -won't switch to toprol XL just yet given patient's frustrations and recent change   PAF/AFL -chads2vasc 1, on eliquis, toprol tartrate -no s/s of bleeding -follows with ep, discussing ablation with pt   Follow up with general cardiology, EP

## 2020-11-16 NOTE — Progress Notes (Signed)
Primary Care Physician: Lois Huxley, PA Primary Cardiologist: Dr Johney Frame Primary Electrophysiologist: Dr Quentin Ore  Referring Physician: Dr Dwana Melena Peter Mcmillan is a 65 y.o. male with a history of atrial flutter, atrial fibrillation, systolic dysfunction, RA who presents for follow up in the [facility]. Patient presented to the hospital 10/05/20 with afib RVR and was started on Eliquis for a CHADS2VASC score of 1. He underwent TEE guided DCCV on 10/06/20. He was hospitalized again 11/01/20 for progressive dyspnea and lower extremity edema. He was cardioverted in the ED then admitted for observation and diuresis. He lost almost 30 lbs of fluid. He is back in atrial flutter today but denies any awareness of his arrhythmia. He denies any symptoms of fluid overload.  F/u in afib clinic, 11/16/20. Unfortunately, the rate control strategy from  last visit did not work and pt presented to the ER  8/14 with atrial flutter with RVR. He was successfully cardioverted. Today he remains in SR. He is pending seeing Dr. Quentin Ore for ablation 8/30. He is concerned re going back out of rhythm as he has had 3 cardioversion's in the last month. He is concerned that workign will bring on arrhythmia and is asking to be placed out of work until he can be further evaluated by Dr. Quentin Ore 8/30.   Today, he denies symptoms of palpitations, chest pain, shortness of breath, orthopnea, PND, lower extremity edema, dizziness, presyncope, syncope, snoring, daytime somnolence, bleeding, or neurologic sequela. The patient is tolerating medications without difficulties and is otherwise without complaint today.    Atrial Fibrillation Risk Factors:  he does not have symptoms or diagnosis of sleep apnea. he does not have a history of rheumatic fever. he does not have a history of alcohol use. The patient does have a history of early familial atrial fibrillation or other arrhythmias. Mother has  afib.  he has a BMI of Body mass index is 25.38 kg/m.Marland Kitchen Filed Weights   11/16/20 0915  Weight: 97.1 kg    Family History  Problem Relation Age of Onset   Heart attack Father    Heart attack Mother    Dementia Mother    Healthy Sister    Healthy Son      Atrial Fibrillation Management history:  Previous antiarrhythmic drugs: none Previous cardioversions: 10/06/20, 11/01/20 Previous ablations: none CHADS2VASC score: 1 Anticoagulation history: Eliquis   Past Medical History:  Diagnosis Date   Atrial flutter ([place])    Erectile dysfunction    Rheumatoid arthritis ([place])    Tobacco abuse    Smokeless tobacco   Past Surgical History:  Procedure Laterality Date   BACK SURGERY  1999   CARDIOVERSION N/A 10/06/2020   Procedure: CARDIOVERSION;  Surgeon: Donato Heinz, MD;  Location: [facility];  Service: Cardiovascular;  Laterality: N/A;   MELANOMA EXCISION  2020   TEE WITHOUT CARDIOVERSION N/A 10/06/2020   Procedure: TRANSESOPHAGEAL ECHOCARDIOGRAM (TEE);  Surgeon: Donato Heinz, MD;  Location: [facility] ENDOSCOPY;  Service: Cardiovascular;  Laterality: N/A;    Current Outpatient Medications  Medication Sig Dispense Refill   Abatacept (ORENCIA CLICKJECT) 0000000 MG/ML SOAJ Inject 125 mg into the skin once a week. 12 mL 0   apixaban (ELIQUIS) 5 MG TABS tablet Take 1 tablet (5 mg total) by mouth 2 (two) times daily. 60 tablet 11   cholecalciferol (VITAMIN D3) 25 MCG (1000 UNIT) tablet Take 1,000 Units by mouth daily.     folic acid (FOLVITE) 1 MG tablet  Take 1 tablet (1 mg total) by mouth daily. 90 tablet 0   loratadine (CLARITIN) 10 MG tablet Take 10 mg by mouth as needed for allergies.     methotrexate (RHEUMATREX) 2.5 MG tablet Take 15 mg by mouth once a week. Takes on Sunday.     metoprolol tartrate (LOPRESSOR) 50 MG tablet Take 1 tablet (50 mg total) by mouth 2 (two) times daily. 60 tablet 3   sacubitril-valsartan (ENTRESTO) 24-26 MG Take 1 tablet by mouth 2 (two) times  daily. 60 tablet 0   furosemide (LASIX) 20 MG tablet Take 1 tablet (20 mg total) by mouth daily as needed. 30 tablet 6   predniSONE (DELTASONE) 10 MG tablet Take 10 mg by mouth daily with breakfast.     spironolactone (ALDACTONE) 25 MG tablet Take 0.5 tablets (12.5 mg total) by mouth daily. 15 tablet 1   No current facility-administered medications for this encounter.    Allergies  Allergen Reactions   Codeine Other (See Comments)    agitation   Other Hives, Swelling and Other (See Comments)    make feel weird; hives/swelling   Penicillin G Other (See Comments)   Penicillins Itching, Swelling and Rash    Social History   Socioeconomic History   Marital status: Married    Spouse name: Not on file   Number of children: 1   Years of education: Not on file   Highest education level: Associate degree: occupational, Hotel manager, or vocational program  Occupational History   Occupation: Clinical biochemist  Tobacco Use   Smoking status: Never   Smokeless tobacco: Current    Types: Snuff   Tobacco comments:    Not ready to quit snuff completely, but states has cut back since hospitalization.   Vaping Use   Vaping Use: Never used  Substance and Sexual Activity   Alcohol use: Not Currently    Comment: Rare(nothing in 2 weeks 11/12/20)   Drug use: No   Sexual activity: Not on file  Other Topics Concern   Not on file  Social History Narrative   Not on file   Social Determinants of Health   Financial Resource Strain: High Risk   Difficulty of Paying Living Expenses: Hard  Food Insecurity: No Food Insecurity   Worried About Running Out of Food in the Last Year: Never true   Ran Out of Food in the Last Year: Never true  Transportation Needs: No Transportation Needs   Lack of Transportation (Medical): No   Lack of Transportation (Non-Medical): No  Physical Activity: Not on file  Stress: Not on file  Social Connections: Not on file  Intimate Partner Violence: Not on file     ROS-  All systems are reviewed and negative except as per the HPI above.  Physical Exam: Vitals:   11/16/20 0915  BP: 114/70  Pulse: 71  Weight: 97.1 kg  Height: '6\' 5"'$  (1.956 m)    GEN- The patient is a well appearing male, alert and oriented x 3 today.   Head- normocephalic, atraumatic Eyes-  Sclera clear, conjunctiva pink Ears- hearing intact Oropharynx- clear Neck- supple  Lungs- Clear to ausculation bilaterally, normal work of breathing Heart- Regular rate and rhythm, tachycardia, no murmurs, rubs or gallops  GI- soft, NT, ND, + BS Extremities- no clubbing, cyanosis, or edema MS- no significant deformity or atrophy Skin- no rash or lesion Psych- euthymic mood, full affect Neuro- strength and sensation are intact  Wt Readings from Last 3 Encounters:  11/16/20 97.1 kg  11/16/20 97.1 kg  11/14/20 96.1 kg    EKG today demonstrates  Sinus rhythm at 71 bpm, pr int 142 ms, qrs int 88 ms, qtc 410 ms   TEE 10/06/20 demonstrated  1. Left ventricular ejection fraction, by estimation, is 40 to 45%. The  left ventricle has mildly decreased function. The left ventricle  demonstrates global hypokinesis.   2. Right ventricular systolic function is normal. The right ventricular  size is mildly enlarged.   3. Left atrial size was mildly dilated. No left atrial/left atrial  appendage thrombus was detected.   4. The mitral valve is normal in structure. Mild mitral valve  regurgitation.   5. The aortic valve is tricuspid. Aortic valve regurgitation is not  visualized.   Conclusion(s)/Recommendation(s): No LA/LAA thrombus identified. Successful  cardioversion performed with restoration of normal sinus rhythm.   Epic records are reviewed at length today  CHA2DS2-VASc Score = 1  The patient's score is based upon: CHF History: Yes HTN History: No Diabetes History: No Stroke History: No Vascular Disease History: No Age Score: 0 Gender Score: 0      ASSESSMENT AND PLAN: 1.  Paroxysmal Atrial Fibrillation/atrial flutter The patient's CHA2DS2-VASc score is 1, indicating a 0.6% annual risk of stroke.   Patient  has required a 3rd CV within a short period of time this past Sunday  for typical atrial flutter with RVR and is in SR today   He is pending seeing Dr. Quentin Ore 8/30 for ablation  Continue   metoprolol at 50 mg bid  Continue Eliquis 5 mg BID  2. Chronic systolic CHF EF [id number] No signs or symptoms of fluid overload today. Will be important for him to maintain SR. He has f/u with HF TOC clinic after this visit   He is asking to be out of work until he can has f/u with Dr. 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 Norabelle Kondo, [facility] [address] [place], [place] [address] [phone]

## 2020-11-16 NOTE — Patient Instructions (Signed)
Start Spironolactone 12.5 mg (1/2 tab) Daily  Take Furosemide ONLY AS NEEDED for weight gain or swelling  Your physician recommends that you return for lab work in: 1 week  Keep follow up appointment with Dr Quentin Ore as scheduled 12/01/31  Thank you for allowing [place] to provider your heart failure care after your recent hospitalization. Please follow-up with [facility] to establish with a general cardiologist, they will call you to schedule this appointment  If you have any questions, issues, or concerns before your next appointment please call our office at [phone], opt. 2 and leave a message for the triage nurse.  Do the following things EVERYDAY: Weigh yourself in the morning before breakfast. Write it down and keep it in a log. Take your medicines as prescribed Eat low salt foods--Limit salt (sodium) to 2000 mg per day.  Stay as active as you can everyday Limit all fluids for the day to less than 2 liters

## 2020-11-16 NOTE — Progress Notes (Signed)
Heart and Vascular Care Navigation  11/16/2020  Peter Mcmillan [facility] [birth year] [id number]  Reason for Referral: CSW met with patient in the HF [facility] clinic.    Engaged with patient face to face for initial visit for Heart and Vascular Care Coordination.                                                                                                   Assessment:   Patient is a  65 yo married male who was working full time until hospitalization.   Patient reports when he came for a follow up appointment his wife slipped on pine straw and broke her ankle. He states now his wife is out of work for 3 months with surgery planned for this Friday. Patient feeling a bit overwhelmed with his own medical needs and now concerned about his wife. He reports he is applying for short term disability benefits through his employer.   Patient reports he has been trying to stay ahead of his household bills and paid two months ahead on some of his utilities. He plans to ask his [place] for a 2 month extension to reduce his current financial burden.                              HRT/VAS Care Coordination     Patients Home Cardiology Office --  [facility]   Outpatient Care Team Social Worker   Social Worker Name: Raquel Sarna, Marlinda Mike [phone]   Living arrangements for the past 2 months Single Family Home   Lives with: Spouse   Patient Current Insurance Coverage Commercial Insurance   Patient Has Concern With Paying Medical Bills No   Does Patient Have Prescription Coverage? Yes   Home Assistive Devices/Equipment None   DME Agency NA       Social History:                                                                             SDOH Screenings   Alcohol Screen: Low Risk    Last Alcohol Screening Score (AUDIT): 1  Depression (PHQ2-9): Not on file  Financial Resource Strain: High Risk   Difficulty of Paying Living Expenses: Hard  Food Insecurity: No Food Insecurity   Worried About  Running Out of Food in the Last Year: Never true   Ran Out of Food in the Last Year: Never true  Housing: Medium Risk   Last Housing Risk Score: 1  Physical Activity: Not on file  Social Connections: Not on file  Stress: Not on file  Tobacco Use: High Risk   Smoking Tobacco Use: Never   Smokeless Tobacco Use: Current  Transportation Needs: No Transportation Needs   Lack of Transportation (Medical): No  Lack of Transportation (Non-Medical): No    SDOH Interventions: Financial Resources:  Financial Strain Interventions: Other (Comment) (Working on short term disability through employer) Assisted with short term disability  Food Insecurity:   N/a  Housing Insecurity:  N/a  Transportation:   N/a   Follow-up plan: CSW  assisted with obtaining needed letter for disability process. Patient will follow up with employer and has upcoming appointment with Dr Lambert for ongoing medical care needs. Patient will return call to CSW if needed. Jackie , LCSW, CCSW-MCS [phone]      

## 2020-11-23 ENCOUNTER — Ambulatory Visit (HOSPITAL_COMMUNITY)
Admission: RE | Admit: 2020-11-23 | Discharge: 2020-11-23 | Disposition: A | Discharge status: Home / Self Care | Payer: Commercial Managed Care - PPO | Source: Ambulatory Visit | Attending: Internal Medicine | Admitting: Internal Medicine

## 2020-11-23 ENCOUNTER — Other Ambulatory Visit: Payer: Self-pay

## 2020-11-23 DIAGNOSIS — I5022 Chronic systolic (congestive) heart failure: Secondary | ICD-10-CM | POA: Insufficient documentation

## 2020-11-23 LAB — BASIC METABOLIC PANEL
Anion gap: 5 (ref 5–15)
BUN: 9 mg/dL (ref 8–23)
CO2: 29 mmol/L (ref 22–32)
Calcium: 8.9 mg/dL (ref 8.9–10.3)
Chloride: 107 mmol/L (ref 98–111)
Creatinine, Ser: 0.82 mg/dL (ref 0.61–1.24)
GFR, Estimated: >60 mL/min (ref >60)
Glucose, Bld: 112 mg/dL — ABNORMAL HIGH (ref 70–99)
Potassium: 3.8 mmol/L (ref 3.5–5.1)
Sodium: 141 mmol/L (ref 135–145)

## 2020-11-30 ENCOUNTER — Other Ambulatory Visit: Payer: Self-pay

## 2020-11-30 ENCOUNTER — Encounter: Payer: Self-pay | Admitting: Cardiology

## 2020-11-30 ENCOUNTER — Encounter: Payer: Self-pay | Admitting: *Deleted

## 2020-11-30 ENCOUNTER — Ambulatory Visit (INDEPENDENT_AMBULATORY_CARE_PROVIDER_SITE_OTHER): Payer: Commercial Managed Care - PPO | Admitting: Cardiology

## 2020-11-30 VITALS — BP 112/72 | HR 88 | Ht 77.0 in | Wt 210.0 lb

## 2020-11-30 DIAGNOSIS — I5022 Chronic systolic (congestive) heart failure: Secondary | ICD-10-CM | POA: Diagnosis not present

## 2020-11-30 DIAGNOSIS — I483 Typical atrial flutter: Secondary | ICD-10-CM | POA: Diagnosis not present

## 2020-11-30 DIAGNOSIS — I480 Paroxysmal atrial fibrillation: Secondary | ICD-10-CM

## 2020-11-30 DIAGNOSIS — I10 Essential (primary) hypertension: Secondary | ICD-10-CM | POA: Diagnosis not present

## 2020-11-30 DIAGNOSIS — I4819 Other persistent atrial fibrillation: Secondary | ICD-10-CM | POA: Diagnosis not present

## 2020-11-30 NOTE — Progress Notes (Signed)
Electrophysiology Office Follow up Visit Note:    Date:  11/30/2020   ID:  Peter Mcmillan, Peter Mcmillan Jan 10, 1956, MRN [id number]  PCP:  Lois Huxley, [place]  [facility] Cardiologist:  Freada Bergeron, MD  [facility] Electrophysiologist:  Vickie Epley     Interval History:    Peter Mcmillan is a 65 y.o. male who presents for a follow up visit.  I last saw the patient when he was hospitalized earlier in August for acute on chronic systolic heart failure secondary to nonischemic cardiomyopathy.  The etiology of his heart failure was thought to be tachycardia mediated.  While hospitalized he was cardioverted.  He was diuresed over 30 pounds while he was inpatient.  Since leaving the hospital he is remained euvolemic and in normal rhythm.  He is taking Eliquis uninterrupted for stroke prophylaxis.  His energy level is much improved since being back in normal rhythm and euvolemic.  He is very interested in pursuing ablation to help maintain normal rhythm while avoiding long-term exposure antiarrhythmic drug therapy.    Past Medical History:  Diagnosis Date   Atrial flutter ([place])    Erectile dysfunction    Rheumatoid arthritis ([place])    Tobacco abuse    Smokeless tobacco    Past Surgical History:  Procedure Laterality Date   BACK SURGERY  1999   CARDIOVERSION N/A 10/06/2020   Procedure: CARDIOVERSION;  Surgeon: Donato Heinz, MD;  Location: [facility];  Service: Cardiovascular;  Laterality: N/A;   MELANOMA EXCISION  2020   TEE WITHOUT CARDIOVERSION N/A 10/06/2020   Procedure: TRANSESOPHAGEAL ECHOCARDIOGRAM (TEE);  Surgeon: Donato Heinz, MD;  Location: [facility] ENDOSCOPY;  Service: Cardiovascular;  Laterality: N/A;    Current Medications: Current Meds  Medication Sig   Abatacept (ORENCIA CLICKJECT) 0000000 MG/ML SOAJ Inject 125 mg into the skin once a week.   apixaban (ELIQUIS) 5 MG TABS tablet Take 1 tablet (5 mg total) by mouth 2 (two) times daily.    cholecalciferol (VITAMIN D3) 25 MCG (1000 UNIT) tablet Take 1,000 Units by mouth daily.   folic acid (FOLVITE) 1 MG tablet Take 1 tablet (1 mg total) by mouth daily.   furosemide (LASIX) 20 MG tablet Take 1 tablet (20 mg total) by mouth daily as needed.   loratadine (CLARITIN) 10 MG tablet Take 10 mg by mouth as needed for allergies.   methotrexate (RHEUMATREX) 2.5 MG tablet Take 15 mg by mouth once a week. Takes on Sunday.   metoprolol tartrate (LOPRESSOR) 50 MG tablet Take 1 tablet (50 mg total) by mouth 2 (two) times daily.   sacubitril-valsartan (ENTRESTO) 24-26 MG Take 1 tablet by mouth 2 (two) times daily.   spironolactone (ALDACTONE) 25 MG tablet Take 0.5 tablets (12.5 mg total) by mouth daily.   [DISCONTINUED] predniSONE (DELTASONE) 10 MG tablet Take 10 mg by mouth daily with breakfast.     Allergies:   Codeine, Other, Penicillin g, and Penicillins   Social History   Socioeconomic History   Marital status: Married    Spouse name: Not on file   Number of children: 1   Years of education: Not on file   Highest education level: Associate degree: occupational, Hotel manager, or vocational program  Occupational History   Occupation: Clinical biochemist  Tobacco Use   Smoking status: Never   Smokeless tobacco: Current    Types: Snuff   Tobacco comments:    Not ready to quit snuff completely, but states has cut back since hospitalization.  Vaping Use   Vaping Use: Never used  Substance and Sexual Activity   Alcohol use: Not Currently    Comment: Rare(nothing in 2 weeks 11/12/20)   Drug use: No   Sexual activity: Not on file  Other Topics Concern   Not on file  Social History Narrative   Not on file   Social Determinants of Health   Financial Resource Strain: High Risk   Difficulty of Paying Living Expenses: Hard  Food Insecurity: No Food Insecurity   Worried About Running Out of Food in the Last Year: Never true   Ran Out of Food in the Last Year: Never true  Transportation  Needs: No Transportation Needs   Lack of Transportation (Medical): No   Lack of Transportation (Non-Medical): No  Physical Activity: Not on file  Stress: Not on file  Social Connections: Not on file     Family History: The patient's family history includes Dementia in his mother; Healthy in his sister and son; Heart attack in his father and mother.  ROS:   Please see the history of present illness.    All other systems reviewed and are negative.  EKGs/Labs/Other Studies Reviewed:    The following studies were reviewed today: Hospital records   October 06, 2020 transesophageal echo Left ventricular function mild to moderately decreased, 40% Global LV hypokinesis Abnormal right ventricular function Mildly dilated left atrium Mild MR  EKG:  The ekg ordered today demonstrates sinus rhythm.  Blocked PAC.  Normal intervals.  Recent Labs: 09/23/2020: ALT 26 11/01/2020: B Natriuretic Peptide 438.2; TSH 0.809 11/03/2020: Magnesium 2.0 11/14/2020: Hemoglobin 16.5; Platelets 359 11/23/2020: BUN 9; Creatinine, Ser 0.82; Potassium 3.8; Sodium 141  Recent Lipid Panel    Component Value Date/Time   CHOL 137 10/05/2020 0844   TRIG 34 10/05/2020 0844   HDL 54 10/05/2020 0844   CHOLHDL 2.5 10/05/2020 0844   VLDL 7 10/05/2020 0844   LDLCALC 76 10/05/2020 0844    Physical Exam:    VS:  BP 112/72   Pulse 88   Ht '6\' 5"'$  (1.956 m)   Wt 210 lb (95.3 kg)   BMI 24.90 kg/m     Wt Readings from Last 3 Encounters:  11/30/20 210 lb (95.3 kg)  11/16/20 214 lb (97.1 kg)  11/16/20 214 lb (97.1 kg)     GEN:  Well nourished, well developed in no acute distress HEENT: Normal NECK: No JVD; No carotid bruits LYMPHATICS: No lymphadenopathy CARDIAC: RRR, no murmurs, rubs, gallops RESPIRATORY:  Clear to auscultation without rales, wheezing or rhonchi  ABDOMEN: Soft, non-tender, non-distended MUSCULOSKELETAL:  No edema; No deformity  SKIN: Warm and dry NEUROLOGIC:  Alert and oriented x  3 PSYCHIATRIC:  Normal affect   ASSESSMENT:    1. Persistent atrial fibrillation ([place])   2. Typical atrial flutter ([place])   3. Chronic systolic heart failure ([place])   4. Essential hypertension    PLAN:    In order of problems listed above:  1. Persistent atrial fibrillation (HCC) Highly symptomatic with tachycardia mediated cardiomyopathy.  On Eliquis for stroke prophylaxis.  A rhythm control strategy is indicated.  I discussed at length the options for rhythm control including antiarrhythmic drug therapy and ablation.  He would like to pursue ablation.  Ablation strategy will be pulmonary vein isolation plus posterior wall isolation plus cavotricuspid isthmus ablation.  He will continue Eliquis uninterrupted around the time of the procedure.   Risk, benefits, and alternatives to EP study and radiofrequency ablation for afib  were also discussed in detail today. These risks include but are not limited to stroke, bleeding, vascular damage, tamponade, perforation, damage to the esophagus, lungs, and other structures, pulmonary vein stenosis, worsening renal function, and death. The patient understands these risk and wishes to proceed.  We will therefore proceed with catheter ablation at the next available time.  Carto, ICE, anesthesia are requested for the procedure.     We do not need another CT scan prior to the procedure.  He had a relatively recent CT scan of the chest.  While it was not protocoled for left atrial assessment, the anatomy of the left atrium is well visualized.   2. Typical atrial flutter ([place]) See #1  3. Chronic systolic heart failure (HCC) Recent hospitalization for acutely decompensated chronic systolic heart failure.  Nonischemic.  Thought to be related to rapid atrial fibrillation.  Rhythm control is indicated as a #1.  Continue Lasix, metoprolol, Entresto.  Continue spironolactone.  4. Essential hypertension Controlled.  Continue above regimen.   Total time  spent with patient today 52 minutes. This includes reviewing records, evaluating the patient and coordinating care.   Medication Adjustments/Labs and Tests Ordered: Current medicines are reviewed at length with the patient today.  Concerns regarding medicines are outlined above.  Orders Placed This Encounter  Procedures   CBC w/Diff   Basic Metabolic Panel (BMET)   EKG 12-Lead   No orders of the defined types were placed in this encounter.    Signed, Lars Mage, MD, [facility], [facility] 11/30/2020 9:52 PM    Electrophysiology [facility]

## 2020-11-30 NOTE — Patient Instructions (Addendum)
Medication Instructions:  Your physician recommends that you continue on your current medications as directed. Please refer to the Current Medication list given to you today.  Labwork: CBC, BMP  Testing/Procedures: Your physician has recommended that you have an ablation. Catheter ablation is a medical procedure used to treat some cardiac arrhythmias (irregular heartbeats). During catheter ablation, a long, thin, flexible tube is put into a blood vessel in your groin (upper thigh), or neck. This tube is called an ablation catheter. It is then guided to your heart through the blood vessel. Radio frequency waves destroy small areas of heart tissue where abnormal heartbeats may cause an arrhythmia to start. Please see the instruction sheet given to you today.    Any Other Special Instructions Will Be Listed Below (If Applicable).  If you need a refill on your cardiac medications before your next appointment, please call your pharmacy.   Cardiac Ablation Cardiac ablation is a procedure to destroy (ablate) some heart tissue that is sending bad signals. These bad signals causeproblems in heart rhythm. The heart has many areas that make these signals. If there are problems in these areas, they can make the heart beat in a way that is not normal.Destroying some tissues can help make the heart rhythm normal. Tell your doctor about: Any allergies you have. All medicines you are taking. These include vitamins, herbs, eye drops, creams, and over-the-counter medicines. Any problems you or family members have had with medicines that make you fall asleep (anesthetics). Any blood disorders you have. Any surgeries you have had. Any medical conditions you have, such as kidney failure. Whether you are pregnant or may be pregnant. What are the risks? This is a safe procedure. But problems may occur, including: Infection. Bruising and bleeding. Bleeding into the chest. Stroke or blood clots. Damage to  nearby areas of your body. Allergies to medicines or dyes. The need for a pacemaker if the normal system is damaged. Failure of the procedure to treat the problem. What happens before the procedure? Medicines Ask your doctor about: Changing or stopping your normal medicines. This is important. Taking aspirin and ibuprofen. Do not take these medicines unless your doctor tells you to take them. Taking other medicines, vitamins, herbs, and supplements. General instructions Follow instructions from your doctor about what you cannot eat or drink. Plan to have someone take you home from the hospital or clinic. If you will be going home right after the procedure, plan to have someone with you for 24 hours. Ask your doctor what steps will be taken to prevent infection. What happens during the procedure?  An IV tube will be put into one of your veins. You will be given a medicine to help you relax. The skin on your neck or groin will be numbed. A cut (incision) will be made in your neck or groin. A needle will be put through your cut and into a large vein. A tube (catheter) will be put into the needle. The tube will be moved to your heart. Dye may be put through the tube. This helps your doctor see your heart. Small devices (electrodes) on the tube will send out signals. A type of energy will be used to destroy some heart tissue. The tube will be taken out. Pressure will be held on your cut. This helps stop bleeding. A bandage will be put over your cut. The exact procedure may vary among doctors and hospitals. What happens after the procedure? You will be watched until you leave  the hospital or clinic. This includes checking your heart rate, breathing rate, oxygen, and blood pressure. Your cut will be watched for bleeding. You will need to lie still for a few hours. Do not drive for 24 hours or as long as your doctor tells you. Summary Cardiac ablation is a procedure to destroy some heart  tissue. This is done to treat heart rhythm problems. Tell your doctor about any medical conditions you may have. Tell him or her about all medicines you are taking to treat them. This is a safe procedure. But problems may occur. These include infection, bruising, bleeding, and damage to nearby areas of your body. Follow what your doctor tells you about food and drink. You may also be told to change or stop some of your medicines. After the procedure, do not drive for 24 hours or as long as your doctor tells you. This information is not intended to replace advice given to you by your health care provider. Make sure you discuss any questions you have with your healthcare provider. Document Revised: 02/20/2019 Document Reviewed: 02/20/2019 Elsevier Patient Education  2022 [employer].

## 2020-12-01 LAB — CBC WITH DIFFERENTIAL/PLATELET
Basophils Absolute: 0.1 10*3/uL (ref 0.0–0.2)
Basos: 1 %
EOS (ABSOLUTE): 0.2 10*3/uL (ref 0.0–0.4)
Eos: 3 %
Hematocrit: 46.1 % (ref 37.5–51.0)
Hemoglobin: 15.6 g/dL (ref 13.0–17.7)
Immature Grans (Abs): 0.1 10*3/uL (ref 0.0–0.1)
Immature Granulocytes: 1 %
Lymphocytes Absolute: 1.8 10*3/uL (ref 0.7–3.1)
Lymphs: 20 %
MCH: 30.5 pg (ref 26.6–33.0)
MCHC: 33.8 g/dL (ref 31.5–35.7)
MCV: 90 fL (ref 79–97)
Monocytes Absolute: 1.2 10*3/uL — ABNORMAL HIGH (ref 0.1–0.9)
Monocytes: 14 %
Neutrophils Absolute: 5.7 10*3/uL (ref 1.4–7.0)
Neutrophils: 61 %
Platelets: 345 10*3/uL (ref 150–450)
RBC: 5.12 x10E6/uL (ref 4.14–5.80)
RDW: 13 % (ref 11.6–15.4)
WBC: 9.1 10*3/uL (ref 3.4–10.8)

## 2020-12-01 LAB — BASIC METABOLIC PANEL
BUN/Creatinine Ratio: 14 (ref 10–24)
BUN: 11 mg/dL (ref 8–27)
CO2: 21 mmol/L (ref 20–29)
Calcium: 9 mg/dL (ref 8.6–10.2)
Chloride: 102 mmol/L (ref 96–106)
Creatinine, Ser: 0.81 mg/dL (ref 0.76–1.27)
Glucose: 96 mg/dL (ref 65–99)
Potassium: 4.4 mmol/L (ref 3.5–5.2)
Sodium: 140 mmol/L (ref 134–144)
eGFR: 98 mL/min/{1.73_m2} (ref >59)

## 2020-12-05 ENCOUNTER — Telehealth: Payer: Self-pay | Admitting: Home Health

## 2020-12-05 NOTE — Telephone Encounter (Signed)
Patient called after hours line reporting pain and swelling of his hands. Called back at [phone], spoke to the patient directly. Patient states he saw Dr Quentin Ore recently, scheduled for ablation on 12/17/20, and was told to wean off his chronic prednisone '10mg'$  (to avoid worsening his A. Fib) which he had stopped taking. He noted his bilateral hands had been swelling for the past 2 days, also having pain of his both arms and shoulders, has difficulty rasing arms above his head. He denied any hx of injury or trauma. He also reports intermittent dizziness, BP 90/60 and AP 135. He has been taking all of his medication, did not take lasix today.   Chart reviewed, suspect rheumatoid arthritis flare up, unclear if he was instructed to wean off prednisone by Dr Quentin Ore. Advised patient to reach out to his rheumatologist discuss further use of prednisone and pain management, OK use tylenol for pain.  Advised patient to continue hold Lasix today and tomorrow, recheck blood pressure, re-evaluate symptoms of dizziness, before resume as needed Lasix. Call back if further questions.  He is agreeable with above plans. All questions answered to satisfaction.

## 2020-12-08 NOTE — Telephone Encounter (Signed)
Left message for Pt advising to continue prednisone.

## 2020-12-09 ENCOUNTER — Other Ambulatory Visit: Payer: Self-pay | Admitting: Internal Medicine

## 2020-12-09 DIAGNOSIS — M059 Rheumatoid arthritis with rheumatoid factor, unspecified: Secondary | ICD-10-CM

## 2020-12-10 ENCOUNTER — Telehealth: Payer: Self-pay | Admitting: Cardiology

## 2020-12-10 DIAGNOSIS — Z0279 Encounter for issue of other medical certificate: Secondary | ICD-10-CM

## 2020-12-10 NOTE — Telephone Encounter (Signed)
Patient brought in forms from the [facility] regarding his short term disability to be completed by Dr. Quentin Ore. Placed in his box 12/10/20  [facility]

## 2020-12-14 ENCOUNTER — Telehealth: Payer: Self-pay | Admitting: Cardiology

## 2020-12-14 NOTE — Telephone Encounter (Signed)
Patient's wife requesting a letter stating when the patient is okay to go back to work. She states she thinks Dr. Quentin Ore said October first.

## 2020-12-14 NOTE — Telephone Encounter (Signed)
Will send message to Dr. Quentin Ore.

## 2020-12-16 NOTE — Pre-Procedure Instructions (Signed)
Attempted to call patient regarding procedure instructions.  No answer 

## 2020-12-17 ENCOUNTER — Other Ambulatory Visit: Payer: Self-pay

## 2020-12-17 ENCOUNTER — Ambulatory Visit (HOSPITAL_COMMUNITY)
Admission: RE | Admit: 2020-12-17 | Discharge: 2020-12-18 | Disposition: A | Discharge status: Home / Self Care | Payer: Commercial Managed Care - PPO | Attending: Cardiology | Admitting: Cardiology

## 2020-12-17 ENCOUNTER — Ambulatory Visit (HOSPITAL_BASED_OUTPATIENT_CLINIC_OR_DEPARTMENT_OTHER): Payer: Commercial Managed Care - PPO

## 2020-12-17 ENCOUNTER — Ambulatory Visit (HOSPITAL_COMMUNITY): Payer: Commercial Managed Care - PPO | Admitting: Anesthesiology

## 2020-12-17 ENCOUNTER — Encounter (HOSPITAL_COMMUNITY): Payer: Self-pay | Admitting: Cardiology

## 2020-12-17 ENCOUNTER — Encounter (HOSPITAL_COMMUNITY)
Admission: RE | Disposition: A | Discharge status: Home / Self Care | Payer: Self-pay | Source: Home / Self Care | Attending: Cardiology

## 2020-12-17 DIAGNOSIS — I428 Other cardiomyopathies: Secondary | ICD-10-CM | POA: Diagnosis not present

## 2020-12-17 DIAGNOSIS — Z79899 Other long term (current) drug therapy: Secondary | ICD-10-CM | POA: Diagnosis not present

## 2020-12-17 DIAGNOSIS — F17220 Nicotine dependence, chewing tobacco, uncomplicated: Secondary | ICD-10-CM | POA: Diagnosis not present

## 2020-12-17 DIAGNOSIS — Z7901 Long term (current) use of anticoagulants: Secondary | ICD-10-CM | POA: Insufficient documentation

## 2020-12-17 DIAGNOSIS — I4819 Other persistent atrial fibrillation: Secondary | ICD-10-CM | POA: Insufficient documentation

## 2020-12-17 DIAGNOSIS — I5022 Chronic systolic (congestive) heart failure: Secondary | ICD-10-CM | POA: Diagnosis not present

## 2020-12-17 DIAGNOSIS — I4892 Unspecified atrial flutter: Secondary | ICD-10-CM | POA: Insufficient documentation

## 2020-12-17 DIAGNOSIS — Z20822 Contact with and (suspected) exposure to covid-19: Secondary | ICD-10-CM | POA: Insufficient documentation

## 2020-12-17 DIAGNOSIS — I483 Typical atrial flutter: Secondary | ICD-10-CM | POA: Diagnosis not present

## 2020-12-17 DIAGNOSIS — I110 Hypertensive heart disease with heart failure: Secondary | ICD-10-CM | POA: Insufficient documentation

## 2020-12-17 DIAGNOSIS — I4891 Unspecified atrial fibrillation: Secondary | ICD-10-CM | POA: Diagnosis present

## 2020-12-17 DIAGNOSIS — Z8249 Family history of ischemic heart disease and other diseases of the circulatory system: Secondary | ICD-10-CM | POA: Insufficient documentation

## 2020-12-17 DIAGNOSIS — I8290 Acute embolism and thrombosis of unspecified vein: Secondary | ICD-10-CM

## 2020-12-17 HISTORY — PX: ATRIAL FIBRILLATION ABLATION: EP1191

## 2020-12-17 HISTORY — PX: TEE WITHOUT CARDIOVERSION: SHX5443

## 2020-12-17 LAB — ECHO TEE
Height: 77 in
Weight: 3296 oz

## 2020-12-17 LAB — SARS CORONAVIRUS 2 (TAT 6-24 HRS): SARS Coronavirus 2: NEGATIVE

## 2020-12-17 SURGERY — ATRIAL FIBRILLATION ABLATION
Anesthesia: General

## 2020-12-17 MED ORDER — METOPROLOL TARTRATE 50 MG PO TABS
50.0000 mg | ORAL_TABLET | Freq: Two times a day (BID) | ORAL | Status: DC
  Administered 2020-12-17 – 2020-12-18 (×2): 50 mg via ORAL
  Filled 2020-12-17 (×2): qty 1

## 2020-12-17 MED ORDER — ACETAMINOPHEN 325 MG PO TABS: 650.0000 mg | ORAL_TABLET | ORAL | Status: DC | PRN

## 2020-12-17 MED ORDER — HEPARIN SODIUM (PORCINE) 1000 UNIT/ML IJ SOLN
INTRAMUSCULAR | Status: AC
  Filled 2020-12-17: qty 1

## 2020-12-17 MED ORDER — SODIUM CHLORIDE 0.9% FLUSH: 3.0000 mL | INTRAVENOUS | Status: DC | PRN

## 2020-12-17 MED ORDER — ROCURONIUM BROMIDE 10 MG/ML (PF) SYRINGE
PREFILLED_SYRINGE | INTRAVENOUS | Status: DC | PRN
  Administered 2020-12-17: 20 mg via INTRAVENOUS
  Administered 2020-12-17 (×2): 30 mg via INTRAVENOUS
  Administered 2020-12-17: 50 mg via INTRAVENOUS
  Administered 2020-12-17: 20 mg via INTRAVENOUS

## 2020-12-17 MED ORDER — HEPARIN (PORCINE) IN NACL 1000-0.9 UT/500ML-% IV SOLN
INTRAVENOUS | Status: AC
  Filled 2020-12-17: qty 500

## 2020-12-17 MED ORDER — MIDAZOLAM HCL 2 MG/2ML IJ SOLN
INTRAMUSCULAR | Status: DC | PRN
  Administered 2020-12-17: 2 mg via INTRAVENOUS

## 2020-12-17 MED ORDER — HEPARIN SODIUM (PORCINE) 1000 UNIT/ML IJ SOLN
INTRAMUSCULAR | Status: DC | PRN
  Administered 2020-12-17: 1000 [IU] via INTRAVENOUS

## 2020-12-17 MED ORDER — SODIUM CHLORIDE 0.9 % IV SOLN: 250.0000 mL | INTRAVENOUS | Status: DC | PRN

## 2020-12-17 MED ORDER — DEXAMETHASONE SODIUM PHOSPHATE 10 MG/ML IJ SOLN
INTRAMUSCULAR | Status: DC | PRN
  Administered 2020-12-17: 5 mg via INTRAVENOUS

## 2020-12-17 MED ORDER — PHENYLEPHRINE HCL-NACL 20-0.9 MG/250ML-% IV SOLN
INTRAVENOUS | Status: DC | PRN
Start: 2020-12-17 — End: 2020-12-17
  Administered 2020-12-17: 20 ug/min via INTRAVENOUS

## 2020-12-17 MED ORDER — PANTOPRAZOLE SODIUM 40 MG PO TBEC
40.0000 mg | DELAYED_RELEASE_TABLET | Freq: Every day | ORAL | Status: DC
  Administered 2020-12-17 – 2020-12-18 (×2): 40 mg via ORAL
  Filled 2020-12-17 (×2): qty 1

## 2020-12-17 MED ORDER — FENTANYL CITRATE (PF) 250 MCG/5ML IJ SOLN
INTRAMUSCULAR | Status: DC | PRN
  Administered 2020-12-17: 100 ug via INTRAVENOUS

## 2020-12-17 MED ORDER — LIDOCAINE 2% (20 MG/ML) 5 ML SYRINGE
INTRAMUSCULAR | Status: DC | PRN
  Administered 2020-12-17: 60 mg via INTRAVENOUS

## 2020-12-17 MED ORDER — PREDNISONE 10 MG PO TABS
10.0000 mg | ORAL_TABLET | Freq: Every day | ORAL | Status: DC
  Administered 2020-12-18: 10 mg via ORAL
  Filled 2020-12-17: qty 1

## 2020-12-17 MED ORDER — HEPARIN SODIUM (PORCINE) 1000 UNIT/ML IJ SOLN
INTRAMUSCULAR | Status: DC | PRN
  Administered 2020-12-17: 4000 [IU] via INTRAVENOUS
  Administered 2020-12-17: 13000 [IU] via INTRAVENOUS

## 2020-12-17 MED ORDER — COLCHICINE 0.6 MG PO TABS
0.6000 mg | ORAL_TABLET | Freq: Two times a day (BID) | ORAL | Status: DC
  Administered 2020-12-17 – 2020-12-18 (×2): 0.6 mg via ORAL
  Filled 2020-12-17 (×2): qty 1

## 2020-12-17 MED ORDER — APIXABAN 5 MG PO TABS
5.0000 mg | ORAL_TABLET | Freq: Two times a day (BID) | ORAL | Status: DC
  Administered 2020-12-17 – 2020-12-18 (×2): 5 mg via ORAL
  Filled 2020-12-17 (×2): qty 1

## 2020-12-17 MED ORDER — PROTAMINE SULFATE 10 MG/ML IV SOLN
INTRAVENOUS | Status: DC | PRN
  Administered 2020-12-17: 10 mg via INTRAVENOUS

## 2020-12-17 MED ORDER — HEPARIN (PORCINE) IN NACL 1000-0.9 UT/500ML-% IV SOLN
INTRAVENOUS | Status: DC | PRN
  Administered 2020-12-17 (×3): 500 mL

## 2020-12-17 MED ORDER — PHENYLEPHRINE 40 MCG/ML (10ML) SYRINGE FOR IV PUSH (FOR BLOOD PRESSURE SUPPORT)
PREFILLED_SYRINGE | INTRAVENOUS | Status: DC | PRN
  Administered 2020-12-17 (×3): 80 ug via INTRAVENOUS

## 2020-12-17 MED ORDER — SPIRONOLACTONE 12.5 MG HALF TABLET
12.5000 mg | ORAL_TABLET | Freq: Every day | ORAL | Status: DC
  Administered 2020-12-18: 12.5 mg via ORAL
  Filled 2020-12-17: qty 1

## 2020-12-17 MED ORDER — SODIUM CHLORIDE 0.9 % IV SOLN: INTRAVENOUS | Status: DC

## 2020-12-17 MED ORDER — SODIUM CHLORIDE 0.9% FLUSH
3.0000 mL | Freq: Two times a day (BID) | INTRAVENOUS | Status: DC
  Administered 2020-12-17 – 2020-12-18 (×2): 3 mL via INTRAVENOUS

## 2020-12-17 MED ORDER — ONDANSETRON HCL 4 MG/2ML IJ SOLN: 4.0000 mg | Freq: Four times a day (QID) | INTRAMUSCULAR | Status: DC | PRN

## 2020-12-17 MED ORDER — SACUBITRIL-VALSARTAN 24-26 MG PO TABS
1.0000 | ORAL_TABLET | Freq: Two times a day (BID) | ORAL | Status: DC
  Administered 2020-12-18: 1 via ORAL
  Filled 2020-12-17: qty 1

## 2020-12-17 MED ORDER — FUROSEMIDE 20 MG PO TABS
20.0000 mg | ORAL_TABLET | Freq: Every day | ORAL | Status: DC
  Administered 2020-12-17 – 2020-12-18 (×2): 20 mg via ORAL
  Filled 2020-12-17 (×2): qty 1

## 2020-12-17 MED ORDER — PROPOFOL 10 MG/ML IV BOLUS
INTRAVENOUS | Status: DC | PRN
  Administered 2020-12-17: 200 mg via INTRAVENOUS

## 2020-12-17 MED ORDER — SUGAMMADEX SODIUM 200 MG/2ML IV SOLN
INTRAVENOUS | Status: DC | PRN
  Administered 2020-12-17: 200 mg via INTRAVENOUS

## 2020-12-17 SURGICAL SUPPLY — 22 items
BLANKET WARM UNDERBOD FULL ACC (MISCELLANEOUS) ×2 IMPLANT
CATH 8FR REPROCESSED SOUNDSTAR (CATHETERS) ×2 IMPLANT
CATH 8FR SOUNDSTAR REPROCESSED (CATHETERS) IMPLANT
CATH OCTARAY 2.0 F 3-3-3-3-3 (CATHETERS) ×1 IMPLANT
CATH S CIRCA THERM PROBE 10F (CATHETERS) ×2 IMPLANT
CATH SMTCH THERMOCOOL SF DF (CATHETERS) ×1 IMPLANT
CATH SOUNDSTAR ECO 8FR (CATHETERS) ×1 IMPLANT
CATH WEB BI DIR CSDF CRV REPRO (CATHETERS) ×1 IMPLANT
CLOSURE PERCLOSE PROSTYLE (VASCULAR PRODUCTS) ×3 IMPLANT
COVER SWIFTLINK CONNECTOR (BAG) ×2 IMPLANT
MAT PREVALON FULL STRYKER (MISCELLANEOUS) ×1 IMPLANT
PACK EP LATEX FREE (CUSTOM PROCEDURE TRAY) ×2
PACK EP LF (CUSTOM PROCEDURE TRAY) ×1 IMPLANT
PAD PRO RADIOLUCENT 2001M-C (PAD) ×2 IMPLANT
PATCH CARTO3 (PAD) ×1 IMPLANT
SHEATH BAYLIS TRANSSEPTAL 98CM (NEEDLE) ×1 IMPLANT
SHEATH CARTO VIZIGO SM CVD (SHEATH) ×2 IMPLANT
SHEATH PINNACLE 7F 10CM (SHEATH) ×1 IMPLANT
SHEATH PINNACLE 8F 10CM (SHEATH) ×1 IMPLANT
SHEATH PINNACLE 9F 10CM (SHEATH) ×1 IMPLANT
SHEATH PROBE COVER 6X72 (BAG) ×1 IMPLANT
TUBING SMART ABLATE COOLFLOW (TUBING) ×1 IMPLANT

## 2020-12-17 NOTE — Transfer of Care (Signed)
Immediate Anesthesia Transfer of Care Note  Patient: Peter Mcmillan  Procedure(s) Performed: ATRIAL FIBRILLATION ABLATION TRANSESOPHAGEAL ECHOCARDIOGRAM (TEE)  Patient Location: PACU  Anesthesia Type:General  Level of Consciousness: awake, alert , oriented and patient cooperative  Airway & Oxygen Therapy: Patient Spontanous Breathing and Patient connected to nasal cannula oxygen  Post-op Assessment: Report given to RN and Post -op Vital signs reviewed and stable  Post vital signs: Reviewed and stable  Last Vitals:  Vitals Value Taken Time  BP 132/76 12/17/20 1818  Temp 37.4 C 12/17/20 1745  Pulse 85 12/17/20 1818  Resp 11 12/17/20 1819  SpO2 100 % 12/17/20 1818  Vitals shown include unvalidated device data.  Last Pain:  Vitals:   12/17/20 1744  TempSrc:   PainSc: 0-No pain         Complications: No notable events documented.

## 2020-12-17 NOTE — Progress Notes (Signed)
  Echocardiogram Echocardiogram Transesophageal has been performed.  Peter Mcmillan M 12/17/2020, 2:04 PM

## 2020-12-17 NOTE — H&P (Signed)
Electrophysiology Office Follow up Visit Note:     Date:  11/30/2020    ID:  Chas, Branan 1955-05-08, MRN [id number]   PCP:  Lois Huxley, [place]      [facility] Cardiologist:  Freada Bergeron, MD  [facility] Electrophysiologist:  Vickie Epley       Interval History:     Peter Mcmillan is a 65 y.o. male who presents for a follow up visit.  I last saw the patient when he was hospitalized earlier in August for acute on chronic systolic heart failure secondary to nonischemic cardiomyopathy.  The etiology of his heart failure was thought to be tachycardia mediated.  While hospitalized he was cardioverted.  He was diuresed over 30 pounds while he was inpatient.  Since leaving the hospital he is remained euvolemic and in normal rhythm.  He is taking Eliquis uninterrupted for stroke prophylaxis.  His energy level is much improved since being back in normal rhythm and euvolemic.  He is very interested in pursuing ablation to help maintain normal rhythm while avoiding long-term exposure antiarrhythmic drug therapy.           Past Medical History:  Diagnosis Date   Atrial flutter ([place])     Erectile dysfunction     Rheumatoid arthritis ([place])     Tobacco abuse      Smokeless tobacco           Past Surgical History:  Procedure Laterality Date   BACK SURGERY   1999   CARDIOVERSION N/A 10/06/2020    Procedure: CARDIOVERSION;  Surgeon: Donato Heinz, MD;  Location: [facility];  Service: Cardiovascular;  Laterality: N/A;   MELANOMA EXCISION   2020   TEE WITHOUT CARDIOVERSION N/A 10/06/2020    Procedure: TRANSESOPHAGEAL ECHOCARDIOGRAM (TEE);  Surgeon: Donato Heinz, MD;  Location: [facility] ENDOSCOPY;  Service: Cardiovascular;  Laterality: N/A;      Current Medications: Active Medications      Current Meds  Medication Sig   Abatacept (ORENCIA CLICKJECT) 0000000 MG/ML SOAJ Inject 125 mg into the skin once a week.   apixaban (ELIQUIS) 5 MG TABS  tablet Take 1 tablet (5 mg total) by mouth 2 (two) times daily.   cholecalciferol (VITAMIN D3) 25 MCG (1000 UNIT) tablet Take 1,000 Units by mouth daily.   folic acid (FOLVITE) 1 MG tablet Take 1 tablet (1 mg total) by mouth daily.   furosemide (LASIX) 20 MG tablet Take 1 tablet (20 mg total) by mouth daily as needed.   loratadine (CLARITIN) 10 MG tablet Take 10 mg by mouth as needed for allergies.   methotrexate (RHEUMATREX) 2.5 MG tablet Take 15 mg by mouth once a week. Takes on Sunday.   metoprolol tartrate (LOPRESSOR) 50 MG tablet Take 1 tablet (50 mg total) by mouth 2 (two) times daily.   sacubitril-valsartan (ENTRESTO) 24-26 MG Take 1 tablet by mouth 2 (two) times daily.   spironolactone (ALDACTONE) 25 MG tablet Take 0.5 tablets (12.5 mg total) by mouth daily.   [DISCONTINUED] predniSONE (DELTASONE) 10 MG tablet Take 10 mg by mouth daily with breakfast.        Allergies:   Codeine, Other, Penicillin g, and Penicillins    Social History         Socioeconomic History   Marital status: Married      Spouse name: Not on file   Number of children: 1   Years of education: Not on file   Highest education level:  Associate degree: occupational, technical, or vocational program  Occupational History   Occupation: Clinical biochemist  Tobacco Use   Smoking status: Never   Smokeless tobacco: Current      Types: Snuff   Tobacco comments:      Not ready to quit snuff completely, but states has cut back since hospitalization.   Vaping Use   Vaping Use: Never used  Substance and Sexual Activity   Alcohol use: Not Currently      Comment: Rare(nothing in 2 weeks 11/12/20)   Drug use: No   Sexual activity: Not on file  Other Topics Concern   Not on file  Social History Narrative   Not on file    Social Determinants of Health       Financial Resource Strain: High Risk   Difficulty of Paying Living Expenses: Hard  Food Insecurity: No Food Insecurity   Worried About Running Out of Food in  the Last Year: Never true   Ran Out of Food in the Last Year: Never true  Transportation Needs: No Transportation Needs   Lack of Transportation (Medical): No   Lack of Transportation (Non-Medical): No  Physical Activity: Not on file  Stress: Not on file  Social Connections: Not on file      Family History: The patient's family history includes Dementia in his mother; Healthy in his sister and son; Heart attack in his father and mother.   ROS:   Please see the history of present illness.    All other systems reviewed and are negative.   EKGs/Labs/Other Studies Reviewed:     The following studies were reviewed today: Hospital records     October 06, 2020 transesophageal echo Left ventricular function mild to moderately decreased, 40% Global LV hypokinesis Abnormal right ventricular function Mildly dilated left atrium Mild MR   EKG:  The ekg ordered today demonstrates sinus rhythm.  Blocked PAC.  Normal intervals.   Recent Labs: 09/23/2020: ALT 26 11/01/2020: B Natriuretic Peptide 438.2; TSH 0.809 11/03/2020: Magnesium 2.0 11/14/2020: Hemoglobin 16.5; Platelets 359 11/23/2020: BUN 9; Creatinine, Ser 0.82; Potassium 3.8; Sodium 141  Recent Lipid Panel Labs (Brief)          Component Value Date/Time    CHOL 137 10/05/2020 0844    TRIG 34 10/05/2020 0844    HDL 54 10/05/2020 0844    CHOLHDL 2.5 10/05/2020 0844    VLDL 7 10/05/2020 0844    LDLCALC 76 10/05/2020 0844        Physical Exam:     VS:  BP 112/72   Pulse 88   Ht '6\' 5"'$  (1.956 m)   Wt 210 lb (95.3 kg)   BMI 24.90 kg/m         Wt Readings from Last 3 Encounters:  11/30/20 210 lb (95.3 kg)  11/16/20 214 lb (97.1 kg)  11/16/20 214 lb (97.1 kg)      GEN:  Well nourished, well developed in no acute distress HEENT: Normal NECK: No JVD; No carotid bruits LYMPHATICS: No lymphadenopathy CARDIAC: RRR, no murmurs, rubs, gallops RESPIRATORY:  Clear to auscultation without rales, wheezing or rhonchi  ABDOMEN: Soft,  non-tender, non-distended MUSCULOSKELETAL:  No edema; No deformity  SKIN: Warm and dry NEUROLOGIC:  Alert and oriented x 3 PSYCHIATRIC:  Normal affect    ASSESSMENT:     1. Persistent atrial fibrillation ([place])   2. Typical atrial flutter ([place])   3. Chronic systolic heart failure ([place])   4. Essential hypertension  PLAN:     In order of problems listed above:   1. Persistent atrial fibrillation (HCC) Highly symptomatic with tachycardia mediated cardiomyopathy.  On Eliquis for stroke prophylaxis.  A rhythm control strategy is indicated.  I discussed at length the options for rhythm control including antiarrhythmic drug therapy and ablation.  He would like to pursue ablation.  Ablation strategy will be pulmonary vein isolation plus posterior wall isolation plus cavotricuspid isthmus ablation.  He will continue Eliquis uninterrupted around the time of the procedure.     Risk, benefits, and alternatives to EP study and radiofrequency ablation for afib were also discussed in detail today. These risks include but are not limited to stroke, bleeding, vascular damage, tamponade, perforation, damage to the esophagus, lungs, and other structures, pulmonary vein stenosis, worsening renal function, and death. The patient understands these risk and wishes to proceed.  We will therefore proceed with catheter ablation at the next available time.  Carto, ICE, anesthesia are requested for the procedure.       We do not need another CT scan prior to the procedure.  He had a relatively recent CT scan of the chest.  While it was not protocoled for left atrial assessment, the anatomy of the left atrium is well visualized.     2. Typical atrial flutter ([place]) See #1   3. Chronic systolic heart failure (HCC) Recent hospitalization for acutely decompensated chronic systolic heart failure.  Nonischemic.  Thought to be related to rapid atrial fibrillation.  Rhythm control is indicated as a #1.  Continue  Lasix, metoprolol, Entresto.  Continue spironolactone.  4. Essential hypertension Controlled.  Continue above regimen.    --------------------------------------------------------  I have seen, examined the patient, and reviewed the above assessment and plan.    Plan for PVI and CTI. Will need TEE prior to the procedure due to CT unable to exclude LAA thrombus.   Vickie Epley, MD 12/17/2020 9:32 AM

## 2020-12-17 NOTE — Anesthesia Procedure Notes (Signed)
Procedure Name: Intubation Date/Time: 12/17/2020 1:25 PM Performed by: Carolan Clines, CRNA Pre-anesthesia Checklist: Patient identified, Emergency Drugs available, Suction available and Patient being monitored Patient Re-evaluated:Patient Re-evaluated prior to induction Oxygen Delivery Method: Circle System Utilized Preoxygenation: Pre-oxygenation with 100% oxygen Induction Type: IV induction Ventilation: Mask ventilation without difficulty and Oral airway inserted - appropriate to patient size Laryngoscope Size: Mac and 4 Grade View: Grade I Tube type: Oral Tube size: 7.5 mm Number of attempts: 1 Airway Equipment and Method: Stylet and Oral airway Placement Confirmation: ETT inserted through vocal cords under direct vision, positive ETCO2 and breath sounds checked- equal and bilateral Secured at: 23 cm Tube secured with: Tape Dental Injury: Teeth and Oropharynx as per pre-operative assessment

## 2020-12-17 NOTE — Anesthesia Preprocedure Evaluation (Signed)
Anesthesia Evaluation  Patient identified by MRN, date of birth, ID band Patient awake    Reviewed: Allergy & Precautions, NPO status , Patient's Chart, lab work & pertinent test results, reviewed documented beta blocker date and time   Airway Mallampati: II  TM Distance: >3 FB Neck ROM: Full    Dental  (+) Missing, Dental Advisory Given,    Pulmonary  Covid 19 03/2020   Pulmonary exam normal breath sounds clear to auscultation       Cardiovascular hypertension, Pt. on medications and Pt. on home beta blockers  Rhythm:Irregular Rate:Normal     Neuro/Psych negative neurological ROS  negative psych ROS   GI/Hepatic Neg liver ROS, GERD  Medicated and Controlled,  Endo/Other  negative endocrine ROS  Renal/GU negative Renal ROS  negative genitourinary   Musculoskeletal  (+) Arthritis , Rheumatoid disorders,  RA diagnosed 04/2020 post Covid   Abdominal   Peds  Hematology RA diagnosed post Covid  Eliquis therapy- last dose yesterday PM   Anesthesia Other Findings   Reproductive/Obstetrics                             Anesthesia Physical Anesthesia Plan  ASA: 2  Anesthesia Plan: General   Post-op Pain Management:    Induction: Intravenous  PONV Risk Score and Plan: 3 and Treatment may vary due to age or medical condition  Airway Management Planned: Oral ETT  Additional Equipment:   Intra-op Plan:   Post-operative Plan: Extubation in OR  Informed Consent: I have reviewed the patients History and Physical, chart, labs and discussed the procedure including the risks, benefits and alternatives for the proposed anesthesia with the patient or authorized representative who has indicated his/her understanding and acceptance.     Dental advisory given  Plan Discussed with: CRNA and Anesthesiologist  Anesthesia Plan Comments:         Anesthesia Quick Evaluation

## 2020-12-17 NOTE — Discharge Instructions (Signed)
Post procedure care instructions No driving for 4 days. No lifting over 5 lbs for 1 week. No vigorous or sexual activity for 1 week. You may return to work/your usual activities on 12/25/20. Keep procedure site clean & dry. If you notice increased pain, swelling, bleeding or pus, call/return!  You may shower after 24 hours, but no soaking in baths/hot tubs/pools for 1 week.    You have an appointment set up with the [facility].  Multiple studies have shown that being followed by a dedicated atrial fibrillation clinic in addition to the standard care you receive from your other physicians improves health. We believe that enrollment in the atrial fibrillation clinic will allow [place] to better care for you.   The phone number to the [facility] is [phone]. The clinic is staffed Monday through Friday from 8:30am to 5pm.  Parking Directions: The clinic is located in the Heart and Vascular Building connected to [facility]. 1)From [address] turn on to [address] and go to the 3rd entrance  (Heart and Vascular entrance) on the right. 2)Look to the right for Heart &Vascular Parking Garage. 3)A code for the entrance is required, for October is 3333.   4)Take the elevators to the 1st floor. Registration is in the room with the glass walls at the end of the hallway.  If you have any trouble parking or locating the clinic, please don't hesitate to call [phone].

## 2020-12-18 ENCOUNTER — Other Ambulatory Visit: Payer: Self-pay

## 2020-12-18 DIAGNOSIS — I480 Paroxysmal atrial fibrillation: Secondary | ICD-10-CM

## 2020-12-18 DIAGNOSIS — Z20822 Contact with and (suspected) exposure to covid-19: Secondary | ICD-10-CM | POA: Diagnosis not present

## 2020-12-18 DIAGNOSIS — I110 Hypertensive heart disease with heart failure: Secondary | ICD-10-CM | POA: Diagnosis not present

## 2020-12-18 DIAGNOSIS — I4819 Other persistent atrial fibrillation: Secondary | ICD-10-CM | POA: Diagnosis not present

## 2020-12-18 DIAGNOSIS — I4892 Unspecified atrial flutter: Secondary | ICD-10-CM | POA: Diagnosis not present

## 2020-12-18 MED ORDER — PANTOPRAZOLE SODIUM 40 MG PO TBEC: 40.0000 mg | DELAYED_RELEASE_TABLET | Freq: Every day | ORAL | 0 refills | Status: DC

## 2020-12-18 MED ORDER — COLCHICINE 0.6 MG PO TABS: 0.6000 mg | ORAL_TABLET | Freq: Two times a day (BID) | ORAL | 0 refills | Status: DC

## 2020-12-18 NOTE — Progress Notes (Signed)
Progress Note  Patient Name: Peter Mcmillan Date of Encounter: 12/18/2020  Primary Cardiologist: Freada Bergeron, MD   Subjective   Denies chest pain or sob.   Inpatient Medications    Scheduled Meds:  apixaban  5 mg Oral BID   colchicine  0.6 mg Oral BID   furosemide  20 mg Oral Daily   metoprolol tartrate  50 mg Oral BID   pantoprazole  40 mg Oral Daily   predniSONE  10 mg Oral Daily   sacubitril-valsartan  1 tablet Oral BID   sodium chloride flush  3 mL Intravenous Q12H   spironolactone  12.5 mg Oral Daily   Continuous Infusions:  sodium chloride     PRN Meds: sodium chloride, acetaminophen, ondansetron (ZOFRAN) IV, sodium chloride flush   Vital Signs    Vitals:   12/18/20 0100 12/18/20 0145 12/18/20 0300 12/18/20 0500  BP: 117/84 122/76 122/76 125/82  Pulse: 99 89 89 99  Resp: '15 15 15   '$ Temp:  98.2 F (36.8 C)  98.5 F (36.9 C)  TempSrc:    Oral  SpO2: 96%  96% 96%  Weight:      Height:        Intake/Output Summary (Last 24 hours) at 12/18/2020 0847 Last data filed at 12/18/2020 0842 Gross per 24 hour  Intake 1720 ml  Output 1600 ml  Net 120 ml   Filed Weights   12/17/20 0940 12/17/20 1845 12/18/20 0001  Weight: 93.4 kg 96.5 kg 93.9 kg    Telemetry    nsr - Personally Reviewed  ECG    nsr - Personally Reviewed  Physical Exam   GEN: No acute distress.   Neck: No JVD Cardiac: RRR, no murmurs, rubs, or gallops.  Respiratory: Clear to auscultation bilaterally. GI: Soft, nontender, non-distended  MS: No edema; No deformity. Neuro:  Nonfocal  Psych: Normal affect   Labs    ChemistryNo results for input(s): NA, K, CL, CO2, GLUCOSE, BUN, CREATININE, CALCIUM, PROT, ALBUMIN, AST, ALT, ALKPHOS, BILITOT, GFRNONAA, GFRAA, ANIONGAP in the last 168 hours.   HematologyNo results for input(s): WBC, RBC, HGB, HCT, MCV, MCH, MCHC, RDW, PLT in the last 168 hours.  Cardiac EnzymesNo results for input(s): TROPONINI in the last 168 hours.  No results for input(s): TROPIPOC in the last 168 hours.   BNPNo results for input(s): BNP, PROBNP in the last 168 hours.   DDimer No results for input(s): DDIMER in the last 168 hours.   Radiology    ECHO TEE  Result Date: 12/17/2020    TRANSESOPHOGEAL ECHO REPORT   Patient Name:   Peter Mcmillan Date of Exam: 12/17/2020 Medical Rec #:  [id number]              Height:       77.0 in Accession #:    [id number]             Weight:       206.0 lb Date of Birth:  [birth year]              BSA:          2.265 m Patient Age:    65 years               BP:           147/96 mmHg Patient Gender: M                      HR:  69 bpm. Exam Location:  Inpatient Procedure: 2D Echo, Cardiac Doppler and Color Doppler Indications:     Atrial fibrillation I48.91  History:         Patient has prior history of Echocardiogram examinations, most                  recent 10/06/2020. Risk Factors:Current Smoker. Past history of                  COVID.  Sonographer:     Darlina Sicilian RDCS Referring Phys:  [id number] Vickie Epley Diagnosing Phys: Eleonore Chiquito MD PROCEDURE: After discussion of the risks and benefits of a TEE, an informed consent was obtained from the patient. The patient was intubated. TEE procedure time was 23 minutes. The transesophogeal probe was passed without difficulty through the esophogus  of the patient. Imaged were obtained with the patient in a supine position. Sedation performed by different physician. The patient was monitored while under deep sedation. Anesthestetic sedation was provided intravenously by Anesthesiology: '200mg'$  of Propofol, '60mg'$  of Lidocaine. Image quality was excellent. The patient's vital signs; including heart rate, blood pressure, and oxygen saturation; remained stable throughout the procedure. The patient developed no complications during the procedure. IMPRESSIONS  1. Pre Ablation TEE. No LAA thrombus present.  2. Left ventricular ejection fraction, by estimation, is 60  to 65%. The left ventricle has normal function. The left ventricle has no regional wall motion abnormalities.  3. Right ventricular systolic function is normal. The right ventricular size is normal.  4. No left atrial/left atrial appendage thrombus was detected.  5. The mitral valve is grossly normal. Trivial mitral valve regurgitation. No evidence of mitral stenosis.  6. The aortic valve is tricuspid. Aortic valve regurgitation is not visualized. No aortic stenosis is present. FINDINGS  Left Ventricle: Left ventricular ejection fraction, by estimation, is 60 to 65%. The left ventricle has normal function. The left ventricle has no regional wall motion abnormalities. The left ventricular internal cavity size was normal in size. Right Ventricle: The right ventricular size is normal. No increase in right ventricular wall thickness. Right ventricular systolic function is normal. Left Atrium: Left atrial size was normal in size. No left atrial/left atrial appendage thrombus was detected. Right Atrium: Right atrial size was normal in size. Pericardium: Trivial pericardial effusion is present. Mitral Valve: The mitral valve is grossly normal. Trivial mitral valve regurgitation. No evidence of mitral valve stenosis. Tricuspid Valve: The tricuspid valve is grossly normal. Tricuspid valve regurgitation is trivial. No evidence of tricuspid stenosis. Aortic Valve: The aortic valve is tricuspid. Aortic valve regurgitation is not visualized. No aortic stenosis is present. Pulmonic Valve: The pulmonic valve was grossly normal. Pulmonic valve regurgitation is not visualized. No evidence of pulmonic stenosis. Aorta: The aortic root and ascending aorta are structurally normal, with no evidence of dilitation. There is minimal (Grade I) layered plaque involving the descending aorta. Venous: The left upper pulmonary vein, left lower pulmonary vein, right lower pulmonary vein and right upper pulmonary vein are normal. IAS/Shunts: No  atrial level shunt detected by color flow Doppler. Additional Comments: Pre Ablation TEE. No LAA thrombus present.   AORTA Ao Root diam: 3.80 cm Ao Asc diam:  3.20 cm TRICUSPID VALVE TR Peak grad:   7.7 mmHg TR Vmax:        139.00 cm/s Eleonore Chiquito MD Electronically signed by Eleonore Chiquito MD Signature Date/Time: 12/17/2020/4:03:01 PM    Final     Cardiac Studies  See above Patient Profile     65 y.o. male admitted for atrial fib ablation, s/p successful PCI. Due to late finish time he was kept overnight.   Assessment & Plan    PAF - he is s/p PCI and doing well. He will be discharged home on his current home meds and protonix 40 mg daily for 45 days and Colchicine 0.6 mg po bid for 5 days Coags - his eliquis has been restarted.   For questions or updates, please contact [place] Please consult [email or web address] for contact info under Cardiology/STEMI.      Signed, Cristopher Peru, MD  12/18/2020, 8:47 AM

## 2020-12-18 NOTE — Discharge Summary (Addendum)
Discharge Summary    Patient ID: Peter Mcmillan MRN: [id number]; DOB: [birth year]  Admit date: 12/17/2020 Discharge date: 12/18/2020  PCP:  Lois Huxley, PA   [facility] Providers Cardiologist:  Freada Bergeron, MD  Electrophysiologist:  Vickie Epley, MD  {  Discharge Diagnoses    Active Problems:   Atrial fibrillation [facility])  Diagnostic Studies/Procedures    EP study/ablation: 12/17/20  PREPROCEDURE DIAGNOSES: 1. Persistent Atrial Fibrillation and Flutter   POSTPROCEDURE DIAGNOSES: 1. Persistent Atrial Fibrillation and Flutter  PROCEDURES: 1. Pulmonary vein isolation (wide antral circumferential lesion set) 2. Posterior wall ablation and isolation  3. Cavotricuspid Isthmus ablation for typical atrial flutter 4. Esophageal temperature monitoring using the Circa temperature monitor 5. Three-dimensional electroanatomic mapping of atrial fibrillation 6. Intracardiac echocardiography 7. Transseptal puncture of an intact septum. 8. Comprehensive EP study   CONCLUSIONS: 1. Successful PVI 2. Successful ablation/isolation of the posterior wall 3. Successful ablation of the tricuspid isthmus 4. Preprocedural TEE demonstrated no LAA thrombus. 5. Intracardiac echo reveals normal LV function, 4 PV, trivial pericardial effusion 6. No early apparent complications. 7. Protonix '40mg'$  PO daily for 45 days 8. Colchicine 0.'6mg'$  PO BID for 5 days  Lysbeth Galas T. Quentin Ore, MD, [facility] Cardiac Electrophysiology _____________   History of Present Illness     Peter Mcmillan is a 65 y.o. male with PMH of atrial fibrillation/flutter, RA and tobacco use who was seen in the office on 8/30 with Dr. Quentin Ore regarding follow up of persistent atrial flutter. He has been on Eliquis for stroke risk reduction. 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 Recommendations given for antiarrhythmic therapy vs ablation. The patient elected to proceed  with ablation which was arranged as an outpatient.    Hospital Course     Presented on 9/16 with Dr. Quentin Ore. Preprocedural TEE showed no LAA thrombus. Underwent successful ablation/isolation of the posterior wall and tricuspid isthmus. No post procedural complications noted. He was resumed on Eliquis post procedure. Plan for protonix '40mg'$  daily for total of 45 days, along with colchicine 0.'6mg'$  BID for 5 days total. Continued on other home medications   Seen by Dr. Lovena Le and deemed stable for discharge home. Follow up in the office has been arranged. Medications sent to pharmacy of choice.   Did the patient have an acute coronary syndrome (MI, NSTEMI, STEMI, etc) this admission?:  No                               Did the patient have a percutaneous coronary intervention (stent / angioplasty)?:  No.       _____________  Discharge Vitals Blood pressure 125/82, pulse 99, temperature 98.5 F (36.9 C), temperature source Oral, resp. rate 15, height '6\' 5"'$  (1.956 m), weight 93.9 kg, SpO2 96 %.  Filed Weights   12/17/20 0940 12/17/20 1845 12/18/20 0001  Weight: 93.4 kg 96.5 kg 93.9 kg    Labs & Radiologic Studies    CBC No results for input(s): WBC, NEUTROABS, HGB, HCT, MCV, PLT in the last 72 hours. Basic Metabolic Panel No results for input(s): NA, K, CL, CO2, GLUCOSE, BUN, CREATININE, CALCIUM, MG, PHOS in the last 72 hours. Liver Function Tests No results for input(s): AST, ALT, ALKPHOS, BILITOT, PROT, ALBUMIN in the last 72 hours. No results for input(s): LIPASE, AMYLASE in the last 72 hours. High Sensitivity Troponin:   No results for input(s): TROPONINIHS in the last 720 hours.  BNP Invalid input(s): POCBNP D-Dimer No results for input(s): DDIMER in the last 72 hours. Hemoglobin A1C No results for input(s): HGBA1C in the last 72 hours. Fasting Lipid Panel No results for input(s): CHOL, HDL, LDLCALC, TRIG, CHOLHDL, LDLDIRECT in the last 72 hours. Thyroid Function Tests No  results for input(s): TSH, T4TOTAL, T3FREE, THYROIDAB in the last 72 hours.  Invalid input(s): FREET3 _____________  ECHO TEE  Result Date: 12/17/2020    TRANSESOPHOGEAL ECHO REPORT   Patient Name:   Peter Mcmillan Date of Exam: 12/17/2020 Medical Rec #:  [id number]              Height:       77.0 in Accession #:    [id number]             Weight:       206.0 lb Date of Birth:  1955/08/09              BSA:          2.265 m Patient Age:    50 years               BP:           147/96 mmHg Patient Gender: M                      HR:           69 bpm. Exam Location:  Inpatient Procedure: 2D Echo, Cardiac Doppler and Color Doppler Indications:     Atrial fibrillation I48.91  History:         Patient has prior history of Echocardiogram examinations, most                  recent 10/06/2020. Risk Factors:Current Smoker. Past history of                  COVID.  Sonographer:     Darlina Sicilian RDCS Referring Phys:  [id number] Vickie Epley Diagnosing Phys: Eleonore Chiquito MD PROCEDURE: After discussion of the risks and benefits of a TEE, an informed consent was obtained from the patient. The patient was intubated. TEE procedure time was 23 minutes. The transesophogeal probe was passed without difficulty through the esophogus  of the patient. Imaged were obtained with the patient in a supine position. Sedation performed by different physician. The patient was monitored while under deep sedation. Anesthestetic sedation was provided intravenously by Anesthesiology: '200mg'$  of Propofol, '60mg'$  of Lidocaine. Image quality was excellent. The patient's vital signs; including heart rate, blood pressure, and oxygen saturation; remained stable throughout the procedure. The patient developed no complications during the procedure. IMPRESSIONS  1. Pre Ablation TEE. No LAA thrombus present.  2. Left ventricular ejection fraction, by estimation, is 60 to 65%. The left ventricle has normal function. The left ventricle has no regional wall  motion abnormalities.  3. Right ventricular systolic function is normal. The right ventricular size is normal.  4. No left atrial/left atrial appendage thrombus was detected.  5. The mitral valve is grossly normal. Trivial mitral valve regurgitation. No evidence of mitral stenosis.  6. The aortic valve is tricuspid. Aortic valve regurgitation is not visualized. No aortic stenosis is present. FINDINGS  Left Ventricle: Left ventricular ejection fraction, by estimation, is 60 to 65%. The left ventricle has normal function. The left ventricle has no regional wall motion abnormalities. The left ventricular internal cavity size was normal in size. Right Ventricle: The right ventricular size  is normal. No increase in right ventricular wall thickness. Right ventricular systolic function is normal. Left Atrium: Left atrial size was normal in size. No left atrial/left atrial appendage thrombus was detected. Right Atrium: Right atrial size was normal in size. Pericardium: Trivial pericardial effusion is present. Mitral Valve: The mitral valve is grossly normal. Trivial mitral valve regurgitation. No evidence of mitral valve stenosis. Tricuspid Valve: The tricuspid valve is grossly normal. Tricuspid valve regurgitation is trivial. No evidence of tricuspid stenosis. Aortic Valve: The aortic valve is tricuspid. Aortic valve regurgitation is not visualized. No aortic stenosis is present. Pulmonic Valve: The pulmonic valve was grossly normal. Pulmonic valve regurgitation is not visualized. No evidence of pulmonic stenosis. Aorta: The aortic root and ascending aorta are structurally normal, with no evidence of dilitation. There is minimal (Grade I) layered plaque involving the descending aorta. Venous: The left upper pulmonary vein, left lower pulmonary vein, right lower pulmonary vein and right upper pulmonary vein are normal. IAS/Shunts: No atrial level shunt detected by color flow Doppler. Additional Comments: Pre Ablation TEE.  No LAA thrombus present.   AORTA Ao Root diam: 3.80 cm Ao Asc diam:  3.20 cm TRICUSPID VALVE TR Peak grad:   7.7 mmHg TR Vmax:        139.00 cm/s Eleonore Chiquito MD Electronically signed by Eleonore Chiquito MD Signature Date/Time: 12/17/2020/4:03:01 PM    Final    Disposition   Pt is being discharged home today in good condition.  Follow-up Plans & Appointments     Follow-up Information     [facility] Follow up.   Specialty: Cardiology Why: 01/18/21 @ 9:30AM with Maximino Greenland, NP Contact information: [id number] [place]        Vickie Epley, MD Follow up.   Specialties: Cardiology, Radiology Why: 03/30/21 @ 8:45AM Contact information: [place] [id number] [phone]                Discharge Instructions     Diet - low sodium heart healthy   Complete by: As directed    Discharge wound care:   Complete by: As directed    Noted in DC instructions   Increase activity slowly   Complete by: As directed       Discharge Medications   Allergies as of 12/18/2020       Reactions   Codeine Other (See Comments)   agitation   Other Hives, Swelling, Other (See Comments)   make feel weird; hives/swelling   Penicillin G Other (See Comments)   Penicillins Itching, Swelling, Rash   Reaction: 5 years ago        Medication List     TAKE these medications    acetaminophen 500 MG tablet Commonly known as: TYLENOL Take 500 mg by mouth every 6 (six) hours as needed (Arthritis).   apixaban 5 MG Tabs tablet Commonly known as: ELIQUIS Take 1 tablet (5 mg total) by mouth 2 (two) times daily.   cholecalciferol 25 MCG (1000 UNIT) tablet Commonly known as: VITAMIN D3 Take 1,000 Units by mouth daily.   colchicine 0.6 MG tablet Take 1 tablet (0.6 mg total) by mouth 2 (two) times daily.   Entresto 24-26 MG Generic drug: sacubitril-valsartan Take 1 tablet  by mouth 2 (two) times daily.   folic acid 1 MG tablet Commonly known as: FOLVITE Take 1 tablet (1 mg total) by mouth daily.   furosemide 20  MG tablet Commonly known as: LASIX Take 1 tablet (20 mg total) by mouth daily as needed.   loratadine 10 MG tablet Commonly known as: CLARITIN Take 10 mg by mouth daily as needed for allergies.   methotrexate 2.5 MG tablet Commonly known as: RHEUMATREX Take 15 mg by mouth once a week. Takes on Sunday.   metoprolol tartrate 50 MG tablet Commonly known as: LOPRESSOR Take 1 tablet (50 mg total) by mouth 2 (two) times daily.   Orencia ClickJect 0000000 MG/ML Soaj Generic drug: Abatacept Inject 125 mg into the skin once a week. What changed: additional instructions   pantoprazole 40 MG tablet Commonly known as: PROTONIX Take 1 tablet (40 mg total) by mouth daily. Start taking on: December 19, 2020   predniSONE 10 MG tablet Commonly known as: DELTASONE Take 1 tablet (10 mg total) by mouth daily.   spironolactone 25 MG tablet Commonly known as: ALDACTONE Take 0.5 tablets (12.5 mg total) by mouth daily.               Discharge Care Instructions  (From admission, onward)           Start     Ordered   12/18/20 0000  Discharge wound care:       Comments: Noted in DC instructions   12/18/20 1105             Outstanding Labs/Studies   N/a   Duration of Discharge Encounter   Greater than 30 minutes including physician time.  Signed, Reino Bellis, NP 12/18/2020, 11:05 AM  EP Attending  Patient seen and examined. Agree with the findings as noted above. The patient is doing well after PVI for atrial fib. His groins demonstrate no bleeding or hematoma or ecchymosis. He is stable for dc home with usual followup as noted above.  Carleene Overlie Dany Walther,MD

## 2020-12-18 NOTE — Anesthesia Postprocedure Evaluation (Signed)
Anesthesia Post Note  Patient: Peter Mcmillan  Procedure(s) Performed: ATRIAL FIBRILLATION ABLATION TRANSESOPHAGEAL ECHOCARDIOGRAM (TEE)     Patient location during evaluation: PACU Anesthesia Type: General Level of consciousness: awake and alert Pain management: pain level controlled Vital Signs Assessment: post-procedure vital signs reviewed and stable Respiratory status: spontaneous breathing, nonlabored ventilation, respiratory function stable and patient connected to nasal cannula oxygen Cardiovascular status: blood pressure returned to baseline and stable Postop Assessment: no apparent nausea or vomiting Anesthetic complications: no   No notable events documented.  Last Vitals:  Vitals:   12/18/20 0300 12/18/20 0500  BP: 122/76 125/82  Pulse: 89 99  Resp: 15   Temp:  36.9 C  SpO2: 96% 96%    Last Pain:  Vitals:   12/18/20 0746  TempSrc:   PainSc: 0-No pain                 Belenda Cruise P Ronnie Mallette

## 2020-12-20 ENCOUNTER — Encounter (HOSPITAL_COMMUNITY): Payer: Self-pay | Admitting: Cardiology

## 2020-12-21 ENCOUNTER — Telehealth: Payer: Self-pay | Admitting: Cardiology

## 2020-12-21 NOTE — Telephone Encounter (Signed)
Paper work completed and returned to medical records.

## 2020-12-21 NOTE — Telephone Encounter (Signed)
Patient is calling to see if the paper work for his disability was fill out so that he is able to get pd. Patient is looking for a cal back today. Please advise

## 2020-12-21 NOTE — Telephone Encounter (Signed)
Form completed by Dr. Quentin Ore and faxed to [facility] on 12/21/2020. Patient has been notified to pick up paperwork. JMM 12/21/2020

## 2020-12-23 LAB — POCT ACTIVATED CLOTTING TIME
Activated Clotting Time: 341 seconds
Activated Clotting Time: 294 seconds
Activated Clotting Time: 323 seconds

## 2020-12-24 ENCOUNTER — Encounter: Payer: Self-pay | Admitting: Internal Medicine

## 2020-12-24 ENCOUNTER — Ambulatory Visit (INDEPENDENT_AMBULATORY_CARE_PROVIDER_SITE_OTHER): Payer: Commercial Managed Care - PPO | Admitting: Internal Medicine

## 2020-12-24 ENCOUNTER — Other Ambulatory Visit: Payer: Self-pay

## 2020-12-24 VITALS — BP 131/87 | HR 90 | Ht 77.0 in | Wt 214.0 lb

## 2020-12-24 DIAGNOSIS — M059 Rheumatoid arthritis with rheumatoid factor, unspecified: Secondary | ICD-10-CM | POA: Diagnosis not present

## 2020-12-24 DIAGNOSIS — Z79899 Other long term (current) drug therapy: Secondary | ICD-10-CM | POA: Diagnosis not present

## 2020-12-24 MED ORDER — FOLIC ACID 1 MG PO TABS: 1.0000 mg | ORAL_TABLET | Freq: Every day | ORAL | 0 refills | Status: DC

## 2020-12-24 MED ORDER — PREDNISONE 5 MG PO TABS: 5.0000 mg | ORAL_TABLET | Freq: Every day | ORAL | 0 refills | Status: DC

## 2020-12-24 MED ORDER — METHOTREXATE 2.5 MG PO TABS: 15.0000 mg | ORAL_TABLET | ORAL | 2 refills | Status: DC

## 2020-12-24 NOTE — Patient Instructions (Signed)
I recommend continuing the methotrexate 6 tablets and the abatacept weekly.  Decrease the prednisone by half to 5 mg for at least 2 weeks then if still doing well can discontinue this completely. I sent a new prescription for the 5 mg tablets if you need these to continue taking.

## 2020-12-24 NOTE — Progress Notes (Signed)
Office Visit Note  Patient: Peter Mcmillan             Date of Birth: February 07, 1956           MRN: [id number]             PCP: Lois Huxley, PA Referring: Lois Huxley, [place] Visit Date: 12/24/2020   Subjective:  Follow-up (Patient is taking MTX, Orencia, and Prednisone. Patient would like to discuss discontinuing Prednisone. Overall patient feels as if symptoms are well controlled.)   History of Present Illness: Peter Mcmillan is a 65 y.o. male here for follow up for seropositive RA after starting Orencia 125 mg [place] weekly and continuing methotrexate 15 mg PO weekly and prednisone 10 mg daily. He feels symptoms are improving significantly since starting orencia now at about 5 weeks. He is interested in stopping the prednisone.  Previous HPI 09/23/20 Peter Mcmillan is a 65 y.o. male here for follow-up of seropositive rheumatoid arthritis on methotrexate 15 mg p.o. weekly and prednisone 20 mg daily.  He feels his symptoms have improved taking the medications although worsened greatly when he decrease his prednisone down to 10 mg as directed and so went back to taking 20 daily.  He has been out of the methotrexate since about 2 weeks ago.   Review of Systems  Constitutional:  Positive for fatigue.  HENT:  Negative for mouth sores, mouth dryness and nose dryness.   Eyes:  Negative for pain, itching, visual disturbance and dryness.  Respiratory:  Positive for cough. Negative for hemoptysis, shortness of breath and difficulty breathing.   Cardiovascular:  Negative for chest pain, palpitations and swelling in legs/feet.  Gastrointestinal:  Negative for abdominal pain, blood in stool, constipation and diarrhea.  Endocrine: Negative for increased urination.  Genitourinary:  Negative for painful urination.  Musculoskeletal:  Positive for joint pain and joint pain. Negative for joint swelling, myalgias, muscle weakness, morning stiffness, muscle tenderness and myalgias.  Skin:   Negative for color change, rash and redness.  Allergic/Immunologic: Negative for susceptible to infections.  Neurological:  Negative for dizziness, numbness, headaches, memory loss and weakness.  Hematological:  Positive for bruising/bleeding tendency. Negative for swollen glands.  Psychiatric/Behavioral:  Negative for confusion and sleep disturbance.    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/11/2020   Prediabetes 06/11/2020   Tobacco user 06/11/2020   Vitamin D deficiency 06/11/2020   Bilateral hand pain 06/11/2020   Bilateral shoulder pain 06/11/2020   Atrial flutter ([place]) 12/09/2013   PULMONARY NODULE 08/18/2008   COUGH 08/18/2008    Past Medical History:  Diagnosis Date   Atrial flutter ([facility])    Erectile dysfunction    Rheumatoid arthritis ([place])    Tobacco abuse    Smokeless tobacco    Family History  Problem Relation Age of Onset   Heart attack Father    Heart attack Mother    Dementia Mother    Healthy Sister    Healthy Son    Past Surgical  History:  Procedure Laterality Date   ATRIAL FIBRILLATION ABLATION N/A 12/17/2020   Procedure: ATRIAL FIBRILLATION ABLATION;  Surgeon: Lanier Prude, MD;  Location: [facility] INVASIVE CV LAB;  Service: Cardiovascular;  Laterality: N/A;   BACK SURGERY  1999   CARDIOVERSION N/A 10/06/2020   Procedure: CARDIOVERSION;  Surgeon: Little Ishikawa, MD;  Location: [facility] ENDOSCOPY;  Service:  Cardiovascular;  Laterality: N/A;   MELANOMA EXCISION  2020   TEE WITHOUT CARDIOVERSION N/A 10/06/2020   Procedure: TRANSESOPHAGEAL ECHOCARDIOGRAM (TEE);  Surgeon: Little Ishikawa, MD;  Location: [facility] ENDOSCOPY;  Service: Cardiovascular;  Laterality: N/A;   TEE WITHOUT CARDIOVERSION N/A 12/17/2020   Procedure: TRANSESOPHAGEAL ECHOCARDIOGRAM (TEE);  Surgeon: Sande Rives, MD;  Location: [facility] INVASIVE CV LAB;  Service: Cardiovascular;  Laterality: N/A;   Social History   Social History Narrative   Not on file   Immunization History  Administered Date(s) Administered   Hep A / Hep B 02/17/2011   Influenza Split 01/29/2017   Moderna Sars-Covid-2 Vaccination 07/09/2019, 07/09/2019, 08/06/2019, 08/06/2019   Pneumococcal Polysaccharide-23 02/12/2009   Tdap 02/12/2009, 06/13/2017     Objective: Vital Signs: BP 131/87 (BP Location: Left Arm, Patient Position: Sitting, Cuff Size: Normal)   Pulse 90   Ht 6\' 5"  (1.956 m)   Wt 214 lb (97.1 kg)   BMI 25.38 kg/m    Physical Exam Cardiovascular:     Rate and Rhythm: Normal rate and regular rhythm.  Pulmonary:     Effort: Pulmonary effort is normal.     Breath sounds: Normal breath sounds.  Musculoskeletal:     Right lower leg: No edema.     Left lower leg: No edema.  Skin:    General: Skin is warm and dry.  Neurological:     Mental Status: He is alert.     Musculoskeletal Exam:  Shoulders full ROM no tenderness or swelling Elbows full ROM no tenderness or swelling Wrists full ROM no tenderness or swelling Fingers left 2nd-3rd MCP swelling, slightly decreased flexion ROM left 2nd digit otherwise normal Knees full ROM no tenderness or swelling  CDAI Exam: CDAI Score: -- Patient Global: --; Provider Global: -- Swollen: 0 ; Tender: 2  Joint Exam 12/24/2020      Right  Left  MCP 2      Tender  MCP 3      Tender     Investigation: No additional findings.  Imaging: ECHO TEE  Result Date: 12/17/2020     TRANSESOPHOGEAL ECHO REPORT   Patient Name:   Peter Mcmillan Date of Exam: 12/17/2020 Medical Rec #:  12/19/2020              Height:       77.0 in Accession #:    [id number]             Weight:       206.0 lb Date of Birth:  Dec 13, 1955              BSA:          2.265 m Patient Age:    64 years               BP:           147/96 mmHg Patient Gender: M                      HR:           69 bpm. Exam Location:  Inpatient Procedure: 2D Echo, Cardiac Doppler and Color Doppler Indications:     Atrial fibrillation I48.91  History:         Patient has prior history of Echocardiogram examinations, most                  recent 10/06/2020. Risk Factors:Current Smoker. Past history of                  COVID.  Sonographer:     Darlina Sicilian RDCS Referring Phys:  [id number] Vickie Epley Diagnosing Phys: Eleonore Chiquito MD PROCEDURE: After discussion of the risks and benefits of a TEE, an informed consent was obtained from the patient. The patient was intubated. TEE procedure time was 23 minutes. The transesophogeal probe was passed without difficulty through the esophogus  of the patient. Imaged were obtained with the patient in a supine position. Sedation performed by different physician. The patient was monitored while under deep sedation. Anesthestetic sedation was provided intravenously by Anesthesiology: 200mg  of Propofol, 60mg  of Lidocaine. Image quality was excellent. The patient's vital signs; including heart rate, blood pressure, and oxygen saturation; remained stable throughout the procedure. The patient developed no complications during the procedure. IMPRESSIONS  1. Pre Ablation TEE. No LAA thrombus present.  2. Left ventricular ejection fraction, by estimation, is 60 to 65%. The left ventricle has normal function. The left ventricle has no regional wall motion abnormalities.  3. Right ventricular systolic function is normal. The right ventricular size is normal.  4. No left atrial/left atrial appendage  thrombus was detected.  5. The mitral valve is grossly normal. Trivial mitral valve regurgitation. No evidence of mitral stenosis.  6. The aortic valve is tricuspid. Aortic valve regurgitation is not visualized. No aortic stenosis is present. FINDINGS  Left Ventricle: Left ventricular ejection fraction, by estimation, is 60 to 65%. The left ventricle has normal function. The left ventricle has no regional wall motion abnormalities. The left ventricular internal cavity size was normal in size. Right Ventricle: The right ventricular size is normal. No increase in right ventricular wall thickness. Right ventricular systolic function is normal. Left Atrium: Left atrial size was normal in size. No left atrial/left atrial appendage thrombus was detected. Right Atrium: Right atrial size was normal in size. Pericardium: Trivial pericardial effusion is present. Mitral Valve: The mitral valve is grossly normal. Trivial mitral valve regurgitation. No evidence of mitral valve stenosis. Tricuspid Valve: The tricuspid valve is grossly normal. Tricuspid valve regurgitation is trivial. No evidence of tricuspid stenosis. Aortic Valve: The aortic valve is tricuspid. Aortic valve regurgitation is not visualized. No aortic stenosis is present. Pulmonic Valve: The pulmonic valve was grossly normal. Pulmonic valve regurgitation is not visualized. No evidence of pulmonic stenosis. Aorta: The aortic root and ascending aorta are structurally normal, with no evidence of dilitation. There is minimal (Grade I) layered plaque involving the descending aorta. Venous: The left upper pulmonary vein, left lower pulmonary vein, right lower pulmonary vein and right upper pulmonary vein are normal. IAS/Shunts: No atrial level shunt detected by color flow Doppler. Additional Comments: Pre Ablation TEE. No LAA thrombus present.   AORTA Ao Root diam: 3.80 cm Ao Asc diam:  3.20 cm TRICUSPID VALVE TR Peak grad:   7.7 mmHg TR Vmax:        139.00 cm/s Eleonore Chiquito MD Electronically signed by Eleonore Chiquito MD Signature Date/Time: 12/17/2020/4:03:01 PM    Final     Recent Labs: Lab Results  Component Value Date  WBC 9.1 11/30/2020   HGB 15.6 11/30/2020   PLT 345 11/30/2020   NA 140 11/30/2020   K 4.4 11/30/2020   CL 102 11/30/2020   CO2 21 11/30/2020   GLUCOSE 96 11/30/2020   BUN 11 11/30/2020   CREATININE 0.81 11/30/2020   BILITOT 0.6 09/23/2020   AST 14 09/23/2020   ALT 26 09/23/2020   PROT 6.6 09/23/2020   CALCIUM 9.0 11/30/2020   GFRAA 111 09/23/2020   QFTBGOLDPLUS NEGATIVE 10/20/2020    Speciality Comments: No specialty comments available.  Procedures:  No procedures performed Allergies: Codeine, Other, Penicillin g, and Penicillins   Assessment / Plan:     Visit Diagnoses: Seropositive rheumatoid arthritis ([place]) - Plan: Sedimentation rate, predniSONE (DELTASONE) 5 MG tablet, methotrexate (RHEUMATREX) 2.5 MG tablet, folic acid (FOLVITE) 1 MG tablet  Disease activity significantly improved after starting orencia and would expect more progress on longer term treatment. Checking ESR for disease activity monitoring. Continue methotrexate 15 mg PO weekly and folic acid 1 mg daily and orencia 125 mg [place] weekly. Recommend tapering off prednisone down to 5 mg daily for 2 weeks then discontinuation, can resume low dose if disease flares.  High risk medication use - Plan: CBC with Differential/Platelet, COMPLETE METABOLIC PANEL WITH GFR  New start orencia follow up and continuation of methotrexate will check CBC and cMP for medication toxicity monitoring.  Orders: Orders Placed This Encounter  Procedures   Sedimentation rate   CBC with Differential/Platelet   COMPLETE METABOLIC PANEL WITH GFR    Meds ordered this encounter  Medications   predniSONE (DELTASONE) 5 MG tablet    Sig: Take 1 tablet (5 mg total) by mouth daily. Can discontinue after 2 weeks if symptoms well controlled.    Dispense:  30 tablet    Refill:  0    methotrexate (RHEUMATREX) 2.5 MG tablet    Sig: Take 6 tablets (15 mg total) by mouth once a week. Takes on Sunday.    Dispense:  30 tablet    Refill:  2   folic acid (FOLVITE) 1 MG tablet    Sig: Take 1 tablet (1 mg total) by mouth daily.    Dispense:  90 tablet    Refill:  0      Follow-Up Instructions: Return in about 3 months (around 03/25/2021) for Ra MTX+ABA f/u 66mos.   Collier Salina, MD  Note - This record has been created using [employer].  Chart creation errors have been sought, but may not always  have been located. Such creation errors do not reflect on  the standard of medical care.

## 2020-12-25 LAB — COMPLETE METABOLIC PANEL WITH GFR
AG Ratio: 1.6 (calc) (ref 1.0–2.5)
ALT: 15 U/L (ref 9–46)
AST: 13 U/L (ref 10–35)
Albumin: 3.8 g/dL (ref 3.6–5.1)
Alkaline phosphatase (APISO): 45 U/L (ref 35–144)
BUN: 9 mg/dL (ref 7–25)
CO2: 27 mmol/L (ref 20–32)
Calcium: 9.3 mg/dL (ref 8.6–10.3)
Chloride: 105 mmol/L (ref 98–110)
Creat: 0.79 mg/dL (ref 0.70–1.35)
Globulin: 2.4 g/dL (calc) (ref 1.9–3.7)
Glucose, Bld: 93 mg/dL (ref 65–99)
Potassium: 4.4 mmol/L (ref 3.5–5.3)
Sodium: 141 mmol/L (ref 135–146)
Total Bilirubin: 0.4 mg/dL (ref 0.2–1.2)
Total Protein: 6.2 g/dL (ref 6.1–8.1)
eGFR: 99 mL/min/{1.73_m2} (ref >=60)

## 2020-12-25 LAB — CBC WITH DIFFERENTIAL/PLATELET
Absolute Monocytes: 816 cells/uL (ref 200–950)
Basophils Absolute: 60 cells/uL (ref 0–200)
Basophils Relative: 0.7 %
Eosinophils Absolute: 111 cells/uL (ref 15–500)
Eosinophils Relative: 1.3 %
HCT: 47 % (ref 38.5–50.0)
Hemoglobin: 15.5 g/dL (ref 13.2–17.1)
Lymphs Abs: 1445 cells/uL (ref 850–3900)
MCH: 30.1 pg (ref 27.0–33.0)
MCHC: 33 g/dL (ref 32.0–36.0)
MCV: 91.3 fL (ref 80.0–100.0)
MPV: 10.6 fL (ref 7.5–12.5)
Monocytes Relative: 9.6 %
Neutro Abs: 6069 cells/uL (ref 1500–7800)
Neutrophils Relative %: 71.4 %
Platelets: 361 10*3/uL (ref 140–400)
RBC: 5.15 10*6/uL (ref 4.20–5.80)
RDW: 13.3 % (ref 11.0–15.0)
Total Lymphocyte: 17 %
WBC: 8.5 10*3/uL (ref 3.8–10.8)

## 2020-12-25 LAB — SEDIMENTATION RATE: Sed Rate: 14 mm/h (ref 0–20)

## 2020-12-27 NOTE — Progress Notes (Signed)
Sedimentation rate is in normal range, blood counts, and kidney and liver function are all fine to continue current plan.

## 2020-12-28 ENCOUNTER — Other Ambulatory Visit: Payer: Self-pay

## 2020-12-28 ENCOUNTER — Encounter (HOSPITAL_BASED_OUTPATIENT_CLINIC_OR_DEPARTMENT_OTHER): Payer: Self-pay

## 2020-12-28 ENCOUNTER — Emergency Department (HOSPITAL_BASED_OUTPATIENT_CLINIC_OR_DEPARTMENT_OTHER): Payer: Commercial Managed Care - PPO

## 2020-12-28 ENCOUNTER — Telehealth: Payer: Self-pay | Admitting: Cardiology

## 2020-12-28 ENCOUNTER — Emergency Department (HOSPITAL_BASED_OUTPATIENT_CLINIC_OR_DEPARTMENT_OTHER)
Admission: EM | Admit: 2020-12-28 | Discharge: 2020-12-28 | Disposition: A | Discharge status: Home / Self Care | Payer: Commercial Managed Care - PPO | Attending: Emergency Medicine | Admitting: Emergency Medicine

## 2020-12-28 DIAGNOSIS — Z7901 Long term (current) use of anticoagulants: Secondary | ICD-10-CM | POA: Insufficient documentation

## 2020-12-28 DIAGNOSIS — Z85820 Personal history of malignant melanoma of skin: Secondary | ICD-10-CM | POA: Diagnosis not present

## 2020-12-28 DIAGNOSIS — F17290 Nicotine dependence, other tobacco product, uncomplicated: Secondary | ICD-10-CM | POA: Diagnosis not present

## 2020-12-28 DIAGNOSIS — I4891 Unspecified atrial fibrillation: Secondary | ICD-10-CM | POA: Insufficient documentation

## 2020-12-28 DIAGNOSIS — R42 Dizziness and giddiness: Secondary | ICD-10-CM | POA: Diagnosis present

## 2020-12-28 DIAGNOSIS — E860 Dehydration: Secondary | ICD-10-CM

## 2020-12-28 DIAGNOSIS — I10 Essential (primary) hypertension: Secondary | ICD-10-CM | POA: Insufficient documentation

## 2020-12-28 DIAGNOSIS — Z79899 Other long term (current) drug therapy: Secondary | ICD-10-CM | POA: Diagnosis not present

## 2020-12-28 LAB — CBC
HCT: 44.3 % (ref 39.0–52.0)
Hemoglobin: 15.4 g/dL (ref 13.0–17.0)
MCH: 30.9 pg (ref 26.0–34.0)
MCHC: 34.8 g/dL (ref 30.0–36.0)
MCV: 88.8 fL (ref 80.0–100.0)
Platelets: 355 10*3/uL (ref 150–400)
RBC: 4.99 MIL/uL (ref 4.22–5.81)
RDW: 13.4 % (ref 11.5–15.5)
WBC: 9.1 10*3/uL (ref 4.0–10.5)
nRBC: 0 % (ref 0.0–0.2)

## 2020-12-28 LAB — TROPONIN I (HIGH SENSITIVITY)
Troponin I (High Sensitivity): 13 ng/L (ref <18)
Troponin I (High Sensitivity): 13 ng/L (ref <18)

## 2020-12-28 LAB — COMPREHENSIVE METABOLIC PANEL
ALT: 15 U/L (ref 0–44)
AST: 12 U/L — ABNORMAL LOW (ref 15–41)
Albumin: 3.8 g/dL (ref 3.5–5.0)
Alkaline Phosphatase: 44 U/L (ref 38–126)
Anion gap: 12 (ref 5–15)
BUN: 14 mg/dL (ref 8–23)
CO2: 21 mmol/L — ABNORMAL LOW (ref 22–32)
Calcium: 9.5 mg/dL (ref 8.9–10.3)
Chloride: 106 mmol/L (ref 98–111)
Creatinine, Ser: 1.25 mg/dL — ABNORMAL HIGH (ref 0.61–1.24)
GFR, Estimated: >60 mL/min (ref >60)
Glucose, Bld: 140 mg/dL — ABNORMAL HIGH (ref 70–99)
Potassium: 3.6 mmol/L (ref 3.5–5.1)
Sodium: 139 mmol/L (ref 135–145)
Total Bilirubin: 0.4 mg/dL (ref 0.3–1.2)
Total Protein: 6.6 g/dL (ref 6.5–8.1)

## 2020-12-28 LAB — CBG MONITORING, ED: Glucose-Capillary: 115 mg/dL — ABNORMAL HIGH (ref 70–99)

## 2020-12-28 LAB — LIPASE, BLOOD: Lipase: 35 U/L (ref 11–51)

## 2020-12-28 MED ORDER — FENTANYL CITRATE (PF) 100 MCG/2ML IJ SOLN
INTRAMUSCULAR | Status: AC | PRN
  Administered 2020-12-28: 100 ug via INTRAVENOUS

## 2020-12-28 MED ORDER — FENTANYL CITRATE PF 50 MCG/ML IJ SOSY
100.0000 ug | PREFILLED_SYRINGE | Freq: Once | INTRAMUSCULAR | Status: DC
  Filled 2020-12-28: qty 2

## 2020-12-28 MED ORDER — SODIUM CHLORIDE 0.9 % IV BOLUS
1000.0000 mL | Freq: Once | INTRAVENOUS | Status: AC
  Administered 2020-12-28: 1000 mL via INTRAVENOUS

## 2020-12-28 MED ORDER — METOPROLOL TARTRATE 75 MG PO TABS: 75.0000 mg | ORAL_TABLET | Freq: Two times a day (BID) | ORAL | 0 refills | Status: DC

## 2020-12-28 NOTE — ED Provider Notes (Signed)
Peter Mcmillan   CSN: [id number] Arrival date & time: 12/28/20  1854     History Chief Complaint  Patient presents with   Dizziness    Peter Mcmillan is a 65 y.o. male.  This is a 65 y.o. male with significant medical history as below, including afib s/p ablation, anticoagulated on DOAC, RA who presents to the ED with complaint of light headed, palpitations.  Patient reports onset of symptoms approximately 11 AM this morning.  Experiencing irregular palpitations, chest tightness.  When patient stands up he feels lightheaded.  Intermittent diaphoresis, no dyspnea.  No nausea or vomiting.  Tolerating oral intake without difficulty.  No fevers, chills, sick contacts or recent travel.  Compliance with DOAC.  No focal numbness, tingling, vision changes or hearing changes.  He has felt similar symptoms in the past when his heart rate was elevated.  History of recent ablation and multiple cardioversions.    The history is provided by the patient.  Dizziness Quality:  Lightheadedness Severity:  Mild Onset quality:  Sudden Duration:  8 hours Timing:  Intermittent Progression:  Waxing and waning Chronicity:  Recurrent Context: standing up   Associated symptoms: chest pain and palpitations   Associated symptoms: no headaches, no nausea, no shortness of breath and no vomiting       Past Medical History:  Diagnosis Date   Atrial flutter (HCC)    Erectile dysfunction    Rheumatoid arthritis ([place])    Tobacco abuse    Smokeless tobacco    Patient Active Problem List   Diagnosis Date Noted   Atrial fibrillation ([place]) 12/17/2020   Rapid atrial fibrillation ([place]) 11/01/2020   Atrial flutter with rapid ventricular response ([place]) 10/05/2020   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   High risk medication use 07/15/2020   Allergic rhinitis due to pollen 06/11/2020   ED (erectile dysfunction) of organic origin 06/11/2020    Essential hypertension 06/11/2020   Gastro-esophageal reflux disease without esophagitis 06/11/2020   Hyperlipidemia 06/11/2020   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   Prediabetes 06/11/2020   Tobacco user 06/11/2020   Vitamin D deficiency 06/11/2020   Bilateral hand pain 06/11/2020   Bilateral shoulder pain 06/11/2020   Atrial flutter ([place]) 12/09/2013   PULMONARY NODULE 08/18/2008   COUGH 08/18/2008    Past Surgical History:  Procedure Laterality Date   ATRIAL FIBRILLATION ABLATION N/A 12/17/2020   Procedure: ATRIAL FIBRILLATION ABLATION;  Surgeon: Vickie Epley, MD;  Location: [facility];  Service: Cardiovascular;  Laterality: N/A;   [place] N/A 10/06/2020   Procedure: CARDIOVERSION;  Surgeon: Donato Heinz, MD;  Location: [facility];  Service: Cardiovascular;  Laterality: N/A;   MELANOMA EXCISION  2020   TEE WITHOUT CARDIOVERSION N/A 10/06/2020   Procedure: TRANSESOPHAGEAL ECHOCARDIOGRAM (TEE);  Surgeon: Donato Heinz, MD;  Location: [facility];  Service: Cardiovascular;  Laterality: N/A;   TEE WITHOUT CARDIOVERSION N/A 12/17/2020   Procedure: TRANSESOPHAGEAL ECHOCARDIOGRAM (TEE);  Surgeon: Geralynn Rile, MD;  Location: [place] [facility];  Service: Cardiovascular;  Laterality: N/A;       Family History  Problem Relation Age of Onset   Heart attack Father    Heart attack Mother    Dementia Mother    Healthy Sister    Healthy Son     Social History   Tobacco Use   Smoking status: Never   Smokeless tobacco: Current    Types: Snuff  Tobacco comments:    Not ready to quit snuff completely, but states has cut back since hospitalization.   Vaping Use   Vaping Use: Never used  Substance Use Topics   Alcohol use: Not Currently    Comment: Rare(nothing in 2 weeks 11/12/20)   Drug use: No    Home Medications Prior to Admission medications   Medication Sig Start Date End Date Taking?  Authorizing Provider  metoprolol tartrate 75 MG TABS Take 75 mg by mouth 2 (two) times daily for 14 days. 12/28/20 01/11/21 Yes Jeanell Sparrow, DO  Abatacept (ORENCIA CLICKJECT) 505 MG/ML SOAJ Inject 125 mg into the skin once a week. Patient taking differently: Inject 125 mg into the skin once a week. Wednesday 11/10/20   Collier Salina, MD  acetaminophen (TYLENOL) 500 MG tablet Take 500 mg by mouth every 6 (six) hours as needed (Arthritis).    [provider]  apixaban (ELIQUIS) 5 MG TABS tablet Take 1 tablet (5 mg total) by mouth 2 (two) times daily. 10/06/20   Barrett, Evelene Croon, PA-C  cholecalciferol (VITAMIN D3) 25 MCG (1000 UNIT) tablet Take 1,000 Units by mouth daily.    [provider]  colchicine 0.6 MG tablet Take 1 tablet (0.6 mg total) by mouth 2 (two) times daily. 12/18/20   Cheryln Manly, NP  folic acid (FOLVITE) 1 MG tablet Take 1 tablet (1 mg total) by mouth daily. 12/24/20   Rice, Resa Miner, MD  furosemide (LASIX) 20 MG tablet Take 1 tablet (20 mg total) by mouth daily as needed. 11/16/20   Katherine Roan, MD  loratadine (CLARITIN) 10 MG tablet Take 10 mg by mouth daily as needed for allergies.    [provider]  methotrexate (RHEUMATREX) 2.5 MG tablet Take 6 tablets (15 mg total) by mouth once a week. Takes on Sunday. 12/24/20   Collier Salina, MD  pantoprazole (PROTONIX) 40 MG tablet Take 1 tablet (40 mg total) by mouth daily. Patient not taking: Reported on 12/24/2020 12/19/20   Reino Bellis B, NP  predniSONE (DELTASONE) 5 MG tablet Take 1 tablet (5 mg total) by mouth daily. Can discontinue after 2 weeks if symptoms well controlled. 12/24/20   Rice, Resa Miner, MD  sacubitril-valsartan (ENTRESTO) 24-26 MG Take 1 tablet by mouth 2 (two) times daily. 11/04/20   Baldwin Jamaica, PA-C  spironolactone (ALDACTONE) 25 MG tablet Take 0.5 tablets (12.5 mg total) by mouth daily. 11/16/20   Katherine Roan, MD    Allergies    Codeine,  Other, Penicillin g, and Penicillins  Review of Systems   Review of Systems  Constitutional:  Positive for fatigue. Negative for chills and fever.  HENT:  Negative for facial swelling and trouble swallowing.   Eyes:  Negative for photophobia and visual disturbance.  Respiratory:  Negative for cough and shortness of breath.   Cardiovascular:  Positive for chest pain and palpitations.  Gastrointestinal:  Negative for abdominal pain, nausea and vomiting.  Endocrine: Negative for polydipsia and polyuria.  Genitourinary:  Negative for difficulty urinating and hematuria.  Musculoskeletal:  Negative for gait problem and joint swelling.  Skin:  Negative for pallor and rash.  Neurological:  Positive for light-headedness. Negative for syncope and headaches.  Psychiatric/Behavioral:  Negative for agitation and confusion.    Physical Exam Updated Vital Signs BP 112/74   Pulse 86   Temp 97.8 F (36.6 C) (Oral)   Resp 16   Ht 6\' 5"  (1.956 m)   Wt  95.3 kg   SpO2 100%   BMI 24.90 kg/m   Physical Exam Vitals and nursing Mcmillan reviewed.  Constitutional:      General: He is in acute distress.     Appearance: He is well-developed.  HENT:     Head: Normocephalic and atraumatic.     Right Ear: External ear normal.     Left Ear: External ear normal.     Mouth/Throat:     Mouth: Mucous membranes are moist.  Eyes:     General: No scleral icterus.    Extraocular Movements: Extraocular movements intact.     Pupils: Pupils are equal, round, and reactive to light.  Cardiovascular:     Rate and Rhythm: Regular rhythm. Tachycardia present.     Pulses: Normal pulses.     Heart sounds: Normal heart sounds.  Pulmonary:     Effort: Pulmonary effort is normal. No respiratory distress.     Breath sounds: Normal breath sounds.  Abdominal:     General: Abdomen is flat.     Palpations: Abdomen is soft.     Tenderness: There is no abdominal tenderness.  Musculoskeletal:        General: Normal range of  motion.     Cervical back: Normal range of motion.     Right lower leg: No edema.     Left lower leg: No edema.  Skin:    General: Skin is warm and dry.     Capillary Refill: Capillary refill takes less than 2 seconds.  Neurological:     Mental Status: He is alert and oriented to person, place, and time.     GCS: GCS eye subscore is 4. GCS verbal subscore is 5. GCS motor subscore is 6.     Cranial Nerves: Cranial nerves are intact. No facial asymmetry.     Sensory: Sensation is intact.     Motor: Tremor present.     Coordination: Coordination is intact.  Psychiatric:        Mood and Affect: Mood normal.        Behavior: Behavior normal.    ED Results / Procedures / Treatments   Labs (all labs ordered are listed, but only abnormal results are displayed) Labs Reviewed  COMPREHENSIVE METABOLIC PANEL - Abnormal; Notable for the following components:      Result Value   CO2 21 (*)    Glucose, Bld 140 (*)    Creatinine, Ser 1.25 (*)    AST 12 (*)    All other components within normal limits  CBG MONITORING, ED - Abnormal; Notable for the following components:   Glucose-Capillary 115 (*)    All other components within normal limits  CBC  LIPASE, BLOOD  TROPONIN I (HIGH SENSITIVITY)  TROPONIN I (HIGH SENSITIVITY)    EKG None  Radiology DG Chest Port 1 View  Result Date: 12/28/2020 CLINICAL DATA:  Chest pain and lightheadedness today. EXAM: PORTABLE CHEST 1 VIEW COMPARISON:  11/01/2020 chest x-ray and 11/02/2020 chest CT FINDINGS: The cardiac silhouette, mediastinal and hilar contours are within normal limits. The lungs demonstrate mild chronic bronchitic changes but no infiltrates, edema or effusions. The bony thorax is intact. IMPRESSION: Mild chronic bronchitic changes but no acute pulmonary findings. Electronically Signed   By: Marijo Sanes M.D.   On: 12/28/2020 19:53    Procedures .Critical Care Performed by: Jeanell Sparrow, DO Authorized by: Jeanell Sparrow, DO    Critical care provider statement:    Critical care time (minutes):  60   Critical care time was exclusive of:  Separately billable procedures and treating other patients   Critical care was necessary to treat or prevent imminent or life-threatening deterioration of the following conditions:  Cardiac failure   Critical care was time spent personally by me on the following activities:  Discussions with consultants, evaluation of patient's response to treatment, examination of patient, ordering and performing treatments and interventions, ordering and review of laboratory studies, ordering and review of radiographic studies, pulse oximetry, re-evaluation of patient's condition, obtaining history from patient or surrogate and review of old charts .Cardioversion  Date/Time: 12/28/2020 10:18 PM Performed by: Jeanell Sparrow, DO Authorized by: Jeanell Sparrow, DO   Consent:    Consent obtained:  Verbal   Consent given by:  Patient   Risks discussed:  Cutaneous burn and pain   Alternatives discussed:  No treatment and rate-control medication Pre-procedure details:    Cardioversion basis:  Emergent   Rhythm:  Atrial fibrillation   Electrode placement:  Anterior-posterior Patient sedated: No Attempt one:    Cardioversion mode:  Synchronous   Shock (joules) attempt one: 120.   Shock outcome:  Conversion to normal sinus rhythm Post-procedure details:    Patient status:  Awake   Patient tolerance of procedure:  Tolerated well, no immediate complications   Medications Ordered in ED Medications  fentaNYL (SUBLIMAZE) injection 100 mcg (100 mcg Intravenous Not Given 12/28/20 2009)  sodium chloride 0.9 % bolus 1,000 mL (0 mLs Intravenous Stopped 12/28/20 2147)  fentaNYL (SUBLIMAZE) injection (100 mcg Intravenous Given 12/28/20 2007)    ED Course  I have reviewed the triage vital signs and the nursing notes.  Pertinent labs & imaging results that were available during my care of the patient were  reviewed by me and considered in my medical decision making (see chart for details).    MDM Rules/Calculators/A&P                         This patient complains of palpitations, cp, light headed; this involves an extensive number of treatment options and is a complaint that carries with it a high risk of complications and Morbidity. Vital signs reviewed. Serious etiologies considered.   Upon arrival patient with atrial fibrillation with RVR, hypotensive.  Unstable A. fib.  He is anticoagulated.  Mentation is appropriate.  ECG reviewed, afib with RVR. At this time recommend synchronized cardioversion. Patient consented for this procedure and will proceed. See procedure Mcmillan.  Following cardioversion patient rate controlled, now in sinus rhythm. BP stabilized. Continue IVF. Will d/w cardiologist who recommends increasing lopressor to 75mg  BID, f/u with EP clinic in next couple days, f/u with cardiology. Discussed this with patient who was agreeable.   Labs reviewed and are stable.  Chest x-ray was reviewed by myself and demonstrates no acute process.  Patient overall feeling better.  He is ambulatory.  Remains in sinus rhythm.   The patient improved significantly and was discharged in stable condition. Detailed discussions were had with the patient regarding current findings, and need for close f/u with PCP or on call doctor. The patient has been instructed to return immediately if the symptoms worsen in any way for re-evaluation. Patient verbalized understanding and is in agreement with current care plan. All questions answered prior to discharge.    Final Clinical Impression(s) / ED Diagnoses Final diagnoses:  Atrial fibrillation with RVR (HCC)  Mild dehydration    Rx / DC Orders ED Discharge  Orders          Ordered    metoprolol tartrate 75 MG TABS  2 times daily        12/28/20 2215             Jeanell Sparrow, DO 12/29/20 0106

## 2020-12-28 NOTE — Sedation Documentation (Signed)
New EKG obtained by this RN and provided to MD Pearline Cables.

## 2020-12-28 NOTE — Sedation Documentation (Signed)
First Shock administered by MD Pearline Cables. 120J at this Time.

## 2020-12-28 NOTE — ED Notes (Signed)
Patient signed Consent for Procedure willingly with MD at Bedside.

## 2020-12-28 NOTE — Telephone Encounter (Signed)
Received phone send in directly regarding pt having dizziness off and on today with position changes.  Per wife - pt reports HR in 120 range BP 80s/70s.  Heart rhythm does not feel irregular.  No recent n/v/diarrhea.  Eating and drinking OK.  No other symptoms. Taking medications as listed.  Spoke with Dr Quentin Ore directly regarding patient's s/s.  He advised pt to report to the closest ED for further evaluation.  Spoke with wife who states understanding.

## 2020-12-28 NOTE — ED Triage Notes (Signed)
Pt reports lightheaded 30 mins ago and states his BP was low. GCS 15. Came in  ambulatory   States ablation last 09/16.

## 2020-12-28 NOTE — Telephone Encounter (Signed)
Pt c/o BP issue: STAT if pt c/o blurred vision, one-sided weakness or slurred speech  1. What are your last 5 BP readings? 80 something/ 70 something HR 122  2. Are you having any other symptoms (ex. Dizziness, headache, blurred vision, passed out)? Dizziness, blurred vision and feeling like want to pass out  3. What is your BP issue? low

## 2020-12-28 NOTE — ED Notes (Signed)
This RN presented the AVS utilizing Teachback Method. Patient verbalizes understanding of Discharge Instructions. Opportunity for Questioning and Answers were provided. Patient Discharged from ED ambulatory to Home with Son as Geophysicist/field seismologist.

## 2021-01-03 ENCOUNTER — Other Ambulatory Visit: Payer: Self-pay

## 2021-01-03 ENCOUNTER — Ambulatory Visit (HOSPITAL_COMMUNITY)
Admission: RE | Admit: 2021-01-03 | Discharge: 2021-01-03 | Disposition: A | Discharge status: Home / Self Care | Payer: Commercial Managed Care - PPO | Source: Ambulatory Visit | Attending: Physician Assistant | Admitting: Physician Assistant

## 2021-01-03 ENCOUNTER — Encounter (HOSPITAL_COMMUNITY): Payer: Self-pay | Admitting: Physician Assistant

## 2021-01-03 VITALS — BP 112/90 | HR 69 | Ht 77.0 in | Wt 212.0 lb

## 2021-01-03 DIAGNOSIS — I5022 Chronic systolic (congestive) heart failure: Secondary | ICD-10-CM | POA: Insufficient documentation

## 2021-01-03 DIAGNOSIS — Z885 Allergy status to narcotic agent status: Secondary | ICD-10-CM | POA: Diagnosis not present

## 2021-01-03 DIAGNOSIS — Z7901 Long term (current) use of anticoagulants: Secondary | ICD-10-CM | POA: Diagnosis not present

## 2021-01-03 DIAGNOSIS — Z880 Allergy status to penicillin: Secondary | ICD-10-CM | POA: Insufficient documentation

## 2021-01-03 DIAGNOSIS — I4819 Other persistent atrial fibrillation: Secondary | ICD-10-CM | POA: Diagnosis not present

## 2021-01-03 DIAGNOSIS — Z79899 Other long term (current) drug therapy: Secondary | ICD-10-CM | POA: Diagnosis not present

## 2021-01-03 NOTE — Progress Notes (Addendum)
Primary Care Physician: Lois Huxley, PA Primary Cardiologist: Dr Johney Frame Primary Electrophysiologist: Dr Quentin Ore  Referring Physician: Dr Dwana Melena Peter Mcmillan is a 65 y.o. male with a history of atrial flutter, atrial fibrillation, systolic dysfunction, RA who presents for follow up in the [facility]. Patient presented to the hospital 10/05/20 with afib RVR and was started on Eliquis for a CHADS2VASC score of 1. He underwent TEE guided DCCV on 10/06/20. He was hospitalized again 11/01/20 for progressive dyspnea and lower extremity edema. He was cardioverted in the ED then admitted for observation and diuresis. He lost almost 30 lbs of fluid. Patient presented to the ER  8/14 with atrial flutter with RVR. He was successfully cardioverted.   On follow up today, patient underwent afib and flutter ablation with Dr Quentin Ore on 12/17/20. Unfortunately, he had recurrence of rapid afib and had another DCCV in the ED on 12/28/20. His metoprolol was also increased. Patient feels well today. He did have another episodes of heart racing but this only lasted ~ 2 hours. He does have some CP post ablation which is resolving. He denies groin issues.   Today, he denies symptoms of chest pain, shortness of breath, orthopnea, PND, lower extremity edema, dizziness, presyncope, syncope, snoring, daytime somnolence, bleeding, or neurologic sequela. The patient is tolerating medications without difficulties and is otherwise without complaint today.    Atrial Fibrillation Risk Factors:  he does not have symptoms or diagnosis of sleep apnea. he does not have a history of rheumatic fever. he does not have a history of alcohol use. The patient does have a history of early familial atrial fibrillation or other arrhythmias. Mother has afib.  he has a BMI of Body mass index is 25.14 kg/m.Marland Kitchen Filed Weights   01/03/21 0915  Weight: 96.2 kg    Family History  Problem Relation Age  of Onset   Heart attack Father    Heart attack Mother    Dementia Mother    Healthy Sister    Healthy Son      Atrial Fibrillation Management history:  Previous antiarrhythmic drugs: none Previous cardioversions: 10/06/20, 11/01/20, 11/14/20, 12/28/20 Previous ablations: 12/17/20 CHADS2VASC score: 1 Anticoagulation history: Eliquis   Past Medical History:  Diagnosis Date   Atrial flutter ([place])    Erectile dysfunction    Rheumatoid arthritis ([place])    Tobacco abuse    Smokeless tobacco   Past Surgical History:  Procedure Laterality Date   ATRIAL FIBRILLATION ABLATION N/A 12/17/2020   Procedure: ATRIAL FIBRILLATION ABLATION;  Surgeon: Vickie Epley, MD;  Location: [facility];  Service: Cardiovascular;  Laterality: N/A;   [place] N/A 10/06/2020   Procedure: CARDIOVERSION;  Surgeon: Donato Heinz, MD;  Location: [facility];  Service: Cardiovascular;  Laterality: N/A;   MELANOMA EXCISION  2020   TEE WITHOUT CARDIOVERSION N/A 10/06/2020   Procedure: TRANSESOPHAGEAL ECHOCARDIOGRAM (TEE);  Surgeon: Donato Heinz, MD;  Location: [facility];  Service: Cardiovascular;  Laterality: N/A;   TEE WITHOUT CARDIOVERSION N/A 12/17/2020   Procedure: TRANSESOPHAGEAL ECHOCARDIOGRAM (TEE);  Surgeon: Geralynn Rile, MD;  Location: [facility];  Service: Cardiovascular;  Laterality: N/A;    Current Outpatient Medications  Medication Sig Dispense Refill   Abatacept (ORENCIA CLICKJECT) 423 MG/ML SOAJ Inject 125 mg into the skin once a week. 12 mL 0   acetaminophen (TYLENOL) 500 MG tablet Take 500 mg by mouth every 6 (six) hours  as needed (Arthritis).     apixaban (ELIQUIS) 5 MG TABS tablet Take 1 tablet (5 mg total) by mouth 2 (two) times daily. 60 tablet 11   cholecalciferol (VITAMIN D3) 25 MCG (1000 UNIT) tablet Take 1,000 Units by mouth daily.     colchicine 0.6 MG tablet Take 1 tablet (0.6 mg total) by mouth 2 (two) times daily.  10 tablet 0   folic acid (FOLVITE) 1 MG tablet Take 1 tablet (1 mg total) by mouth daily. 90 tablet 0   furosemide (LASIX) 20 MG tablet Take 1 tablet (20 mg total) by mouth daily as needed. 30 tablet 6   loratadine (CLARITIN) 10 MG tablet Take 10 mg by mouth daily as needed for allergies.     methotrexate (RHEUMATREX) 2.5 MG tablet Take 6 tablets (15 mg total) by mouth once a week. Takes on Sunday. 30 tablet 2   metoprolol tartrate 75 MG TABS Take 75 mg by mouth 2 (two) times daily for 14 days. 28 tablet 0   predniSONE (DELTASONE) 5 MG tablet Take 1 tablet (5 mg total) by mouth daily. Can discontinue after 2 weeks if symptoms well controlled. 30 tablet 0   sacubitril-valsartan (ENTRESTO) 24-26 MG Take 1 tablet by mouth 2 (two) times daily. 60 tablet 0   spironolactone (ALDACTONE) 25 MG tablet Take 0.5 tablets (12.5 mg total) by mouth daily. 15 tablet 1   No current facility-administered medications for this encounter.    Allergies  Allergen Reactions   Codeine Other (See Comments)    agitation   Other Hives, Swelling and Other (See Comments)    make feel weird; hives/swelling   Penicillin G Other (See Comments)   Penicillins Itching, Swelling and Rash    Reaction: 5 years ago    Social History   Socioeconomic History   Marital status: Married    Spouse name: Not on file   Number of children: 1   Years of education: Not on file   Highest education level: Associate degree: occupational, Hotel manager, or vocational program  Occupational History   Occupation: Clinical biochemist  Tobacco Use   Smoking status: Never   Smokeless tobacco: Current    Types: Snuff   Tobacco comments:    Not ready to quit snuff completely, but states has cut back since hospitalization.   Vaping Use   Vaping Use: Never used  Substance and Sexual Activity   Alcohol use: Not Currently    Comment: Rare(nothing in 2 weeks 11/12/20)   Drug use: No   Sexual activity: Not on file  Other Topics Concern   Not on  file  Social History Narrative   Not on file   Social Determinants of Health   Financial Resource Strain: High Risk   Difficulty of Paying Living Expenses: Hard  Food Insecurity: No Food Insecurity   Worried About Running Out of Food in the Last Year: Never true   Ran Out of Food in the Last Year: Never true  Transportation Needs: No Transportation Needs   Lack of Transportation (Medical): No   Lack of Transportation (Non-Medical): No  Physical Activity: Not on file  Stress: Not on file  Social Connections: Not on file  Intimate Partner Violence: Not on file     ROS- All systems are reviewed and negative except as per the HPI above.  Physical Exam: Vitals:   01/03/21 0915  BP: 112/90  Pulse: 69  Weight: 96.2 kg  Height: 6\' 5"  (1.956 m)   GEN- The patient is  a well appearing male, alert and oriented x 3 today.   HEENT-head normocephalic, atraumatic, sclera clear, conjunctiva pink, hearing intact, trachea midline. Lungs- Clear to ausculation bilaterally, normal work of breathing Heart- Regular rate and rhythm, no murmurs, rubs or gallops  GI- soft, NT, ND, + BS Extremities- no clubbing, cyanosis, or edema MS- no significant deformity or atrophy Skin- no rash or lesion Psych- euthymic mood, full affect Neuro- strength and sensation are intact   Wt Readings from Last 3 Encounters:  01/03/21 96.2 kg  12/28/20 95.3 kg  12/24/20 97.1 kg    EKG today demonstrates  SR, PACs Vent. rate 69 BPM PR interval 136 ms QRS duration 82 ms QT/QTcB 386/413 ms  TEE 10/06/20 demonstrated  1. Left ventricular ejection fraction, by estimation, is 40 to 45%. The  left ventricle has mildly decreased function. The left ventricle  demonstrates global hypokinesis.   2. Right ventricular systolic function is normal. The right ventricular  size is mildly enlarged.   3. Left atrial size was mildly dilated. No left atrial/left atrial  appendage thrombus was detected.   4. The mitral valve  is normal in structure. Mild mitral valve  regurgitation.   5. The aortic valve is tricuspid. Aortic valve regurgitation is not  visualized.   Conclusion(s)/Recommendation(s): No LA/LAA thrombus identified. Successful  cardioversion performed with restoration of normal sinus rhythm.   Epic records are reviewed at length today  CHA2DS2-VASc Score = 1  The patient's score is based upon: CHF History: 1 HTN History: 0 Diabetes History: 0 Stroke History: 0 Vascular Disease History: 0 Age Score: 0 Gender Score: 0      ASSESSMENT AND PLAN: 1. Persistent Atrial Fibrillation/atrial flutter The patient's CHA2DS2-VASc score is 1, indicating a 0.6% annual risk of stroke.   S/p afib and flutter ablation with Dr Quentin Ore 12/17/20 S/p DCCV at the ED 12/28/20. Reassured patient that some breakthrough afib is not uncommon for the first few months following ablation. ? If he will need short course of AAD if he has more afib given he is highly symptomatic.  Continue metoprolol 75 mg BID Continue Eliquis 5 mg BID with no missed doses for 3 months post ablation.   2. Chronic systolic CHF EF 51-70% No signs or symptoms of fluid overload.   Follow up in the AF clinic as scheduled. Dr Quentin Ore as scheduled.     [facility] [address] [place], [place] [address] [phone]

## 2021-01-07 NOTE — Telephone Encounter (Signed)
Peter Mcmillan is calling stating he emailed Peter Mcmillan some documents in regards to him going back to work that he was advised to send in. Peter Mcmillan is requesting a callback to confirm they were received.

## 2021-01-18 ENCOUNTER — Ambulatory Visit (HOSPITAL_COMMUNITY): Payer: Commercial Managed Care - PPO | Admitting: Nurse Practitioner

## 2021-01-18 ENCOUNTER — Other Ambulatory Visit: Payer: Self-pay

## 2021-01-18 ENCOUNTER — Ambulatory Visit (HOSPITAL_COMMUNITY)
Admission: RE | Admit: 2021-01-18 | Discharge: 2021-01-18 | Disposition: A | Discharge status: Home / Self Care | Payer: Commercial Managed Care - PPO | Source: Ambulatory Visit | Attending: Nurse Practitioner | Admitting: Nurse Practitioner

## 2021-01-18 ENCOUNTER — Encounter (HOSPITAL_COMMUNITY): Payer: Self-pay | Admitting: Physician Assistant

## 2021-01-18 VITALS — BP 138/80 | HR 83 | Ht 77.0 in | Wt 219.6 lb

## 2021-01-18 DIAGNOSIS — Z7901 Long term (current) use of anticoagulants: Secondary | ICD-10-CM | POA: Diagnosis not present

## 2021-01-18 DIAGNOSIS — Z880 Allergy status to penicillin: Secondary | ICD-10-CM | POA: Diagnosis not present

## 2021-01-18 DIAGNOSIS — F17290 Nicotine dependence, other tobacco product, uncomplicated: Secondary | ICD-10-CM | POA: Insufficient documentation

## 2021-01-18 DIAGNOSIS — Z8249 Family history of ischemic heart disease and other diseases of the circulatory system: Secondary | ICD-10-CM | POA: Insufficient documentation

## 2021-01-18 DIAGNOSIS — Z885 Allergy status to narcotic agent status: Secondary | ICD-10-CM | POA: Diagnosis not present

## 2021-01-18 DIAGNOSIS — I4892 Unspecified atrial flutter: Secondary | ICD-10-CM | POA: Diagnosis not present

## 2021-01-18 DIAGNOSIS — I4819 Other persistent atrial fibrillation: Secondary | ICD-10-CM | POA: Diagnosis not present

## 2021-01-18 DIAGNOSIS — I5022 Chronic systolic (congestive) heart failure: Secondary | ICD-10-CM | POA: Insufficient documentation

## 2021-01-18 NOTE — Progress Notes (Signed)
Primary Care Physician: Lois Huxley, PA Primary Cardiologist: Dr Johney Frame Primary Electrophysiologist: Dr Quentin Ore  Referring Physician: Dr Dwana Melena Peter Mcmillan is a 65 y.o. male with a history of atrial flutter, atrial fibrillation, systolic dysfunction, RA who presents for follow up in the [facility]. Patient presented to the hospital 10/05/20 with afib RVR and was started on Eliquis for a CHADS2VASC score of 1. He underwent TEE guided DCCV on 10/06/20. He was hospitalized again 11/01/20 for progressive dyspnea and lower extremity edema. He was cardioverted in the ED then admitted for observation and diuresis. He lost almost 30 lbs of fluid. Patient presented to the ER  8/14 with atrial flutter with RVR. He was successfully cardioverted.   Patient underwent afib and flutter ablation with Dr Quentin Ore on 12/17/20. Unfortunately, he had recurrence of rapid afib and had another DCCV in the ED on 12/28/20. His metoprolol was also increased.   On follow up today, patient reports that he has done well since his last visit. He has brief palpitations lasting ~ 5 minutes but these are becoming more infrequent. He denies any bleeding issues on anticoagulation. No swallowing pain or groin issues.   Today, he denies symptoms of chest pain, shortness of breath, orthopnea, PND, lower extremity edema, dizziness, presyncope, syncope, snoring, daytime somnolence, bleeding, or neurologic sequela. The patient is tolerating medications without difficulties and is otherwise without complaint today.    Atrial Fibrillation Risk Factors:  he does not have symptoms or diagnosis of sleep apnea. he does not have a history of rheumatic fever. he does not have a history of alcohol use. The patient does have a history of early familial atrial fibrillation or other arrhythmias. Mother has afib.  he has a BMI of Body mass index is 26.04 kg/m.Marland Kitchen Filed Weights   01/18/21 0940  Weight:  99.6 kg     Family History  Problem Relation Age of Onset   Heart attack Father    Heart attack Mother    Dementia Mother    Healthy Sister    Healthy Son      Atrial Fibrillation Management history:  Previous antiarrhythmic drugs: none Previous cardioversions: 10/06/20, 11/01/20, 11/14/20, 12/28/20 Previous ablations: 12/17/20 CHADS2VASC score: 1 Anticoagulation history: Eliquis   Past Medical History:  Diagnosis Date   Atrial flutter ([place])    Erectile dysfunction    Rheumatoid arthritis ([place])    Tobacco abuse    Smokeless tobacco   Past Surgical History:  Procedure Laterality Date   ATRIAL FIBRILLATION ABLATION N/A 12/17/2020   Procedure: ATRIAL FIBRILLATION ABLATION;  Surgeon: Vickie Epley, MD;  Location: [facility];  Service: Cardiovascular;  Laterality: N/A;   [place] N/A 10/06/2020   Procedure: CARDIOVERSION;  Surgeon: Donato Heinz, MD;  Location: [facility];  Service: Cardiovascular;  Laterality: N/A;   MELANOMA EXCISION  2020   TEE WITHOUT CARDIOVERSION N/A 10/06/2020   Procedure: TRANSESOPHAGEAL ECHOCARDIOGRAM (TEE);  Surgeon: Donato Heinz, MD;  Location: [facility];  Service: Cardiovascular;  Laterality: N/A;   TEE WITHOUT CARDIOVERSION N/A 12/17/2020   Procedure: TRANSESOPHAGEAL ECHOCARDIOGRAM (TEE);  Surgeon: Geralynn Rile, MD;  Location: [facility];  Service: Cardiovascular;  Laterality: N/A;    Current Outpatient Medications  Medication Sig Dispense Refill   Abatacept (ORENCIA CLICKJECT) 150 MG/ML SOAJ Inject 125 mg into the skin once a week. 12 mL 0   acetaminophen (TYLENOL) 500 MG tablet Take  500 mg by mouth every 6 (six) hours as needed (Arthritis).     apixaban (ELIQUIS) 5 MG TABS tablet Take 1 tablet (5 mg total) by mouth 2 (two) times daily. 60 tablet 11   cholecalciferol (VITAMIN D3) 25 MCG (1000 UNIT) tablet Take 1,000 Units by mouth daily.     folic acid (FOLVITE) 1 MG  tablet Take 1 tablet (1 mg total) by mouth daily. 90 tablet 0   loratadine (CLARITIN) 10 MG tablet Take 10 mg by mouth daily as needed for allergies.     methotrexate (RHEUMATREX) 2.5 MG tablet Take 6 tablets (15 mg total) by mouth once a week. Takes on Sunday. 30 tablet 2   metoprolol tartrate 75 MG TABS Take 75 mg by mouth 2 (two) times daily for 14 days. 28 tablet 0   predniSONE (DELTASONE) 5 MG tablet Take 1 tablet (5 mg total) by mouth daily. Can discontinue after 2 weeks if symptoms well controlled. 30 tablet 0   sacubitril-valsartan (ENTRESTO) 24-26 MG Take 1 tablet by mouth 2 (two) times daily. 60 tablet 0   spironolactone (ALDACTONE) 25 MG tablet Take 0.5 tablets (12.5 mg total) by mouth daily. 15 tablet 1   No current facility-administered medications for this encounter.    Allergies  Allergen Reactions   Codeine Other (See Comments)    agitation   Other Hives, Swelling and Other (See Comments)    make feel weird; hives/swelling   Penicillin G Other (See Comments)   Penicillins Itching, Swelling and Rash    Reaction: 5 years ago    Social History   Socioeconomic History   Marital status: Married    Spouse name: Not on file   Number of children: 1   Years of education: Not on file   Highest education level: Associate degree: occupational, Hotel manager, or vocational program  Occupational History   Occupation: Clinical biochemist  Tobacco Use   Smoking status: Never   Smokeless tobacco: Current    Types: Snuff   Tobacco comments:    Not ready to quit snuff completely, but states has cut back since hospitalization.   Vaping Use   Vaping Use: Never used  Substance and Sexual Activity   Alcohol use: Yes    Alcohol/week: 1.0 standard drink    Types: 1 Cans of beer per week    Comment: 1 beer last weekend 01/15/2021   Drug use: No   Sexual activity: Not on file  Other Topics Concern   Not on file  Social History Narrative   Not on file   Social Determinants of Health    Financial Resource Strain: High Risk   Difficulty of Paying Living Expenses: Hard  Food Insecurity: No Food Insecurity   Worried About Running Out of Food in the Last Year: Never true   Ran Out of Food in the Last Year: Never true  Transportation Needs: No Transportation Needs   Lack of Transportation (Medical): No   Lack of Transportation (Non-Medical): No  Physical Activity: Not on file  Stress: Not on file  Social Connections: Not on file  Intimate Partner Violence: Not on file     ROS- All systems are reviewed and negative except as per the HPI above.  Physical Exam: Vitals:   01/18/21 0940  BP: 138/80  Pulse: 83  Weight: 99.6 kg  Height: 6\' 5"  (1.956 m)    GEN- The patient is a well appearing male, alert and oriented x 3 today.   HEENT-head normocephalic, atraumatic, sclera clear,  conjunctiva pink, hearing intact, trachea midline. Lungs- Clear to ausculation bilaterally, normal work of breathing Heart- Regular rate and rhythm, no murmurs, rubs or gallops  GI- soft, NT, ND, + BS Extremities- no clubbing, cyanosis, or edema MS- no significant deformity or atrophy Skin- no rash or lesion Psych- euthymic mood, full affect Neuro- strength and sensation are intact   Wt Readings from Last 3 Encounters:  01/18/21 99.6 kg  01/03/21 96.2 kg  12/28/20 95.3 kg    EKG today demonstrates  SR, PAC Vent. rate 83 BPM PR interval 132 ms QRS duration 86 ms QT/QTcB 380/446 ms  TEE 10/06/20 demonstrated  1. Left ventricular ejection fraction, by estimation, is 40 to 45%. The  left ventricle has mildly decreased function. The left ventricle  demonstrates global hypokinesis.   2. Right ventricular systolic function is normal. The right ventricular  size is mildly enlarged.   3. Left atrial size was mildly dilated. No left atrial/left atrial  appendage thrombus was detected.   4. The mitral valve is normal in structure. Mild mitral valve  regurgitation.   5. The aortic  valve is tricuspid. Aortic valve regurgitation is not  visualized.   Conclusion(s)/Recommendation(s): No LA/LAA thrombus identified. Successful  cardioversion performed with restoration of normal sinus rhythm.   Epic records are reviewed at length today  CHA2DS2-VASc Score = 1  The patient's score is based upon: CHF History: 1 HTN History: 0 Diabetes History: 0 Stroke History: 0 Vascular Disease History: 0 Age Score: 0 Gender Score: 0      ASSESSMENT AND PLAN: 1. Persistent Atrial Fibrillation/atrial flutter The patient's CHA2DS2-VASc score is 1, indicating a 0.6% annual risk of stroke.   S/p afib and flutter ablation with Dr Quentin Ore 12/17/20 with DCCV at the ED 12/28/20. Patient in [place].  Continue metoprolol 75 mg BID Continue Eliquis 5 mg BID with no missed doses for 3 months post ablation.   2. Chronic systolic CHF EF 51-02% No signs or symptoms of fluid overload.   Follow up with Dr Quentin Ore as scheduled.     [facility] [address] [place], [place] [address] [phone]

## 2021-01-22 ENCOUNTER — Other Ambulatory Visit: Payer: Self-pay | Admitting: Internal Medicine

## 2021-01-22 DIAGNOSIS — M059 Rheumatoid arthritis with rheumatoid factor, unspecified: Secondary | ICD-10-CM

## 2021-01-28 ENCOUNTER — Other Ambulatory Visit: Payer: Self-pay

## 2021-01-28 ENCOUNTER — Encounter: Payer: Self-pay | Admitting: Internal Medicine

## 2021-01-28 ENCOUNTER — Ambulatory Visit (INDEPENDENT_AMBULATORY_CARE_PROVIDER_SITE_OTHER): Payer: Commercial Managed Care - PPO | Admitting: Internal Medicine

## 2021-01-28 VITALS — BP 120/72 | HR 89 | Ht 77.0 in | Wt 221.6 lb

## 2021-01-28 DIAGNOSIS — E559 Vitamin D deficiency, unspecified: Secondary | ICD-10-CM | POA: Diagnosis not present

## 2021-01-28 DIAGNOSIS — M059 Rheumatoid arthritis with rheumatoid factor, unspecified: Secondary | ICD-10-CM | POA: Diagnosis not present

## 2021-01-28 NOTE — Patient Instructions (Addendum)
Medication Instructions:  Your physician recommends that you continue on your current medications as directed. Please refer to the Current Medication list given to you today.  *If you need a refill on your cardiac medications before your next appointment, please call your pharmacy*   Lab Work: TODAY: SED RATE, VITAMIN D If you have labs (blood work) drawn today and your tests are completely normal, you will receive your results only by: [place] (if you have MyChart) OR A paper copy in the mail If you have any lab test that is abnormal or we need to change your treatment, we will call you to review the results.   Testing/Procedures: NONE  Follow-Up: At [facility], you and your health needs are our priority.  As part of our continuing mission to provide you with exceptional heart care, we have created designated Provider Care Teams.  These Care Teams include your primary Cardiologist (physician) and Advanced Practice Providers (APPs -  Physician Assistants and Nurse Practitioners) who all work together to provide you with the care you need, when you need it.  We recommend signing up for the patient portal called "MyChart".  Sign up information is provided on this After Visit Summary.  MyChart is used to connect with patients for Virtual Visits (Telemedicine).  Patients are able to view lab/test results, encounter notes, upcoming appointments, etc.  Non-urgent messages can be sent to your provider as well.   To learn more about what you can do with MyChart, go to [email or web address].    Your next appointment:   PLEASE KEEP SCHEDULED FOLLOW-UP WITH DR. Quentin Ore  Your physician recommends that you schedule a follow-up appointment in: MAY 2023

## 2021-01-28 NOTE — Progress Notes (Signed)
Cardiology Office Note   Date:  01/28/2021   ID:  Peter Mcmillan, [name] 11/03/1955, MRN [id number]  PCP:  Lois Huxley, PA  Cardiologist:   Dorris Carnes, MD   Patient presents for follow up of atrial fibrillation     History of Present Illness: Peter Mcmillan is a 65 y.o. male with a history of atrial flutter, atrial fibrillation, systolic dysfunction, RA who presents for follow up in the [facility]. Patient presented to the hospital 10/05/20 with afib RVR and was started on Eliquis for a CHADS2VASC score of 1. He underwent TEE guided DCCV on 10/06/20. He was hospitalized again 11/01/20 for progressive dyspnea and lower extremity edema. He was cardioverted in the ED then admitted for observation and diuresis. Patient presented to the ER  8/14 with atrial flutter with RVR. He was successfully cardioverted.    Patient underwent afib and flutter ablation by Grayce Sessions on 12/17/20. Unfortunately, he had recurrence of rapid afib and had another DCCV in the ED on 12/28/20. His metoprolol was also increased.   The pt was last seen by  Louretta Shorten on Jan 18 2021  Since seen the pt has done OK    Breathing is good  Rare isolated palpitation.   No CP    Active   Working   [employer] 9000 to 15000 steps per day     Atrial Fibrillation Risk Factors:   he does not have symptoms or diagnosis of sleep apnea. he does not have a history of rheumatic fever. he does not have a history of alcohol use. The patient does have a history of early familial atrial fibrillation or other arrhythmias. Mother has afib.      Current Meds  Medication Sig   acetaminophen (TYLENOL) 500 MG tablet Take 500 mg by mouth every 6 (six) hours as needed (Arthritis).   apixaban (ELIQUIS) 5 MG TABS tablet Take 1 tablet (5 mg total) by mouth 2 (two) times daily.   cholecalciferol (VITAMIN D3) 25 MCG (1000 UNIT) tablet Take 1,000 Units by mouth daily.   folic acid (FOLVITE) 1 MG tablet Take 1  tablet (1 mg total) by mouth daily.   loratadine (CLARITIN) 10 MG tablet Take 10 mg by mouth daily as needed for allergies.   methotrexate (RHEUMATREX) 2.5 MG tablet Take 6 tablets (15 mg total) by mouth once a week. Takes on Sunday.   metoprolol tartrate 75 MG TABS Take 75 mg by mouth 2 (two) times daily for 14 days.   ORENCIA CLICKJECT 154 MG/ML SOAJ INJECT 125 MG (1 ML) UNDER THE SKIN ONCE A WEEK   predniSONE (DELTASONE) 5 MG tablet Take 1 tablet (5 mg total) by mouth daily. Can discontinue after 2 weeks if symptoms well controlled.   sacubitril-valsartan (ENTRESTO) 24-26 MG Take 1 tablet by mouth 2 (two) times daily.   spironolactone (ALDACTONE) 25 MG tablet Take 0.5 tablets (12.5 mg total) by mouth daily. (Patient taking differently: Take 12.5 mg by mouth daily as needed.)     Allergies:   Codeine, Other, Penicillin g, and Penicillins   Past Medical History:  Diagnosis Date   Atrial flutter ([place])    Erectile dysfunction    Rheumatoid arthritis ([place])    Tobacco abuse    Smokeless tobacco    Past Surgical History:  Procedure Laterality Date   ATRIAL FIBRILLATION ABLATION N/A 12/17/2020   Procedure: ATRIAL FIBRILLATION ABLATION;  Surgeon: Vickie Epley, MD;  Location: [facility];  Service: Cardiovascular;  Laterality: N/A;   [place] N/A 10/06/2020   Procedure: CARDIOVERSION;  Surgeon: Donato Heinz, MD;  Location: [facility];  Service: Cardiovascular;  Laterality: N/A;   MELANOMA EXCISION  2020   TEE WITHOUT CARDIOVERSION N/A 10/06/2020   Procedure: TRANSESOPHAGEAL ECHOCARDIOGRAM (TEE);  Surgeon: Donato Heinz, MD;  Location: [facility];  Service: Cardiovascular;  Laterality: N/A;   TEE WITHOUT CARDIOVERSION N/A 12/17/2020   Procedure: TRANSESOPHAGEAL ECHOCARDIOGRAM (TEE);  Surgeon: Geralynn Rile, MD;  Location: [facility];  Service: Cardiovascular;  Laterality: N/A;     Social History:  The patient   reports that he has never smoked. His smokeless tobacco use includes snuff. He reports current alcohol use of about 1.0 standard drink per week. He reports that he does not use drugs.   Family History:  The patient's family history includes Dementia in his mother; Healthy in his sister and son; Heart attack in his father and mother.    ROS:  Please see the history of present illness. All other systems are reviewed and  Negative to the above problem except as noted.    PHYSICAL EXAM: VS:  BP 120/72   Pulse 89   Ht _0  (1.956 m)   Wt 221 lb 9.6 oz (100.5 kg)   SpO2 99%   BMI 26.28 kg/m   GEN: OVerweight 65 bpm  in no acute distress  HEENT: normal  Neck: no JVD, carotid bruits Cardiac: RRR; no murmurs  NO LE edema  Respiratory:  clear to auscultation bilaterally,  GI: soft, nontender, nondistended, + BS  No hepatomegaly  MS: Rheumatoid changes in R hand  Moving all extremities   Skin: warm and dry, no rash Neuro:  Strength and sensation are intact Psych: euthymic mood, full affect   EKG:  EKG is not ordered today.   Lipid Panel    Component Value Date/Time   CHOL 137 10/05/2020 0844   TRIG 34 10/05/2020 0844   HDL 54 10/05/2020 0844   CHOLHDL 2.5 10/05/2020 0844   VLDL 7 10/05/2020 0844   LDLCALC 76 10/05/2020 0844      Wt Readings from Last 3 Encounters:  01/28/21 221 lb 9.6 oz (100.5 kg)  01/18/21 219 lb 9.6 oz (99.6 kg)  01/03/21 212 lb (96.2 kg)      ASSESSMENT AND PLAN:  1  Atrial fibrillation.   PT is s/p ablation earlier this fall  Had recurrence soon after requireing cardioversion   None since   Keep on current regimen   Feels much better   Has appt with Grayce Sessions in Dec   I will set to see in May     2 CHF   Pt with LVEF that was down last summer  TEE this fall LVEF was normal     VOlume status is good    He is on toprol and Entresto     He is not taking spironolactone    I would not resume     3.  MTX   Will check ESR   He is noticing a flare  Will  check Vit D   Current medicines are reviewed at length with the patient today.  The patient does not have concerns regarding medicines.  Signed, Dorris Carnes, MD  01/28/2021 [facility] [place], [place], [place]  [address] Phone: [phone]; Fax: [phone]

## 2021-01-29 LAB — VITAMIN D 25 HYDROXY (VIT D DEFICIENCY, FRACTURES): Vit D, 25-Hydroxy: 30.7 ng/mL (ref 30.0–100.0)

## 2021-01-29 LAB — SEDIMENTATION RATE: Sed Rate: 30 mm/hr (ref 0–30)

## 2021-02-02 ENCOUNTER — Other Ambulatory Visit: Payer: Self-pay | Admitting: Cardiology

## 2021-02-03 ENCOUNTER — Other Ambulatory Visit: Payer: Self-pay | Admitting: Cardiology

## 2021-02-08 ENCOUNTER — Telehealth: Payer: Self-pay | Admitting: Internal Medicine

## 2021-02-08 DIAGNOSIS — E559 Vitamin D deficiency, unspecified: Secondary | ICD-10-CM

## 2021-02-08 DIAGNOSIS — Z79899 Other long term (current) drug therapy: Secondary | ICD-10-CM

## 2021-02-08 MED ORDER — VITAMIN D 25 MCG (1000 UNIT) PO TABS: 3000.0000 [IU] | ORAL_TABLET | Freq: Every day | ORAL | 3 refills | Status: DC

## 2021-02-08 NOTE — Telephone Encounter (Signed)
-----   Message from Fay Records, MD sent at 01/29/2021  9:43 AM EDT ----- Vit D level is still low end normal by these guidelnes.   Some physicians view 50 mg as desirable level I would recomm increasing to at least 2000, better 3000 per day Follow up Vit D in 4 months

## 2021-02-08 NOTE — Telephone Encounter (Signed)
Pt verbalized understanding of his lab results and Dr. Harrington Challenger' recommendations and will have repeat Vit D level 06/2021.

## 2021-02-08 NOTE — Telephone Encounter (Signed)
Patient was returning call from [facility] to discuss lab results. Please call back

## 2021-02-21 ENCOUNTER — Other Ambulatory Visit: Payer: Self-pay | Admitting: Cardiology

## 2021-03-29 NOTE — Progress Notes (Signed)
Electrophysiology Office Follow up Visit Note:    Date:  03/30/2021   ID:  Peter, Mcmillan 06-29-1955, MRN [id number]  PCP:  Lois Huxley, [place]  [facility] Cardiologist:  Freada Bergeron, MD  [facility] Electrophysiologist:  Vickie Epley, MD    Interval History:    Peter Mcmillan is a 65 y.o. male who presents for a follow up visit.  He had a PVI+CTI ablation 12/17/2020. He required DCCV on 12/28/2020. He is on eliquis for stroke ppx.  He saw Ricky on 01/03/2021. At that appt, patient reported continued palpitations. He saw Audry Pili again on 01/18/2021 and said that his AF symptoms were improving.  He saw Dr Harrington Challenger in clinic 01/28/2021. At that appointment, patient was doing well without recurrence of AF.  Today he is back out of rhythm.  He feels tingling in his hands and feet when he is out of rhythm.  No shortness of breath, presyncope or syncope.  No chest pain.    Past Medical History:  Diagnosis Date   Atrial flutter ([place])    Erectile dysfunction    Rheumatoid arthritis ([place])    Tobacco abuse    Smokeless tobacco    Past Surgical History:  Procedure Laterality Date   ATRIAL FIBRILLATION ABLATION N/A 12/17/2020   Procedure: ATRIAL FIBRILLATION ABLATION;  Surgeon: Vickie Epley, MD;  Location: [place] [facility];  Service: Cardiovascular;  Laterality: N/A;   [place] N/A 10/06/2020   Procedure: CARDIOVERSION;  Surgeon: Donato Heinz, MD;  Location: [place];  Service: Cardiovascular;  Laterality: N/A;   MELANOMA EXCISION  2020   TEE WITHOUT CARDIOVERSION N/A 10/06/2020   Procedure: TRANSESOPHAGEAL ECHOCARDIOGRAM (TEE);  Surgeon: Donato Heinz, MD;  Location: [place];  Service: Cardiovascular;  Laterality: N/A;   TEE WITHOUT CARDIOVERSION N/A 12/17/2020   Procedure: TRANSESOPHAGEAL ECHOCARDIOGRAM (TEE);  Surgeon: Geralynn Rile, MD;  Location: [place] [facility];  Service:  Cardiovascular;  Laterality: N/A;    Current Medications: Current Meds  Medication Sig   acetaminophen (TYLENOL) 500 MG tablet Take 500 mg by mouth every 6 (six) hours as needed (Arthritis).   apixaban (ELIQUIS) 5 MG TABS tablet Take 1 tablet (5 mg total) by mouth 2 (two) times daily.   cholecalciferol (VITAMIN D3) 25 MCG (1000 UNIT) tablet Take 3 tablets (3,000 Units total) by mouth daily.   Elderberry 500 MG CAPS Take 1 tablet by mouth daily.   folic acid (FOLVITE) 1 MG tablet Take 1 tablet (1 mg total) by mouth daily.   loratadine (CLARITIN) 10 MG tablet Take 10 mg by mouth daily as needed for allergies.   methotrexate (RHEUMATREX) 2.5 MG tablet Take 6 tablets (15 mg total) by mouth once a week. Takes on Sunday.   metoprolol succinate (TOPROL-XL) 50 MG 24 hr tablet Take 1 tablet (50 mg total) by mouth daily. Take with or immediately following a meal.   sacubitril-valsartan (ENTRESTO) 24-26 MG Take 1 tablet by mouth 2 (two) times daily.   vitamin C (ASCORBIC ACID) 500 MG tablet Take 500 mg by mouth daily.     Allergies:   Codeine, Other, Penicillin g, and Penicillins   Social History   Socioeconomic History   Marital status: Married    Spouse name: Not on file   Number of children: 1   Years of education: Not on file   Highest education level: Associate degree: occupational, Hotel manager, or vocational program  Occupational History  Occupation: Clinical biochemist  Tobacco Use   Smoking status: Never   Smokeless tobacco: Current    Types: Snuff   Tobacco comments:    Not ready to quit snuff completely, but states has cut back since hospitalization.   Vaping Use   Vaping Use: Never used  Substance and Sexual Activity   Alcohol use: Yes    Alcohol/week: 1.0 standard drink    Types: 1 Cans of beer per week    Comment: 1 beer last weekend 01/15/2021   Drug use: No   Sexual activity: Not on file  Other Topics Concern   Not on file  Social History Narrative   Not on file   Social  Determinants of Health   Financial Resource Strain: High Risk   Difficulty of Paying Living Expenses: Hard  Food Insecurity: No Food Insecurity   Worried About Running Out of Food in the Last Year: Never true   Ran Out of Food in the Last Year: Never true  Transportation Needs: No Transportation Needs   Lack of Transportation (Medical): No   Lack of Transportation (Non-Medical): No  Physical Activity: Not on file  Stress: Not on file  Social Connections: Not on file     Family History: The patient's family history includes Dementia in his mother; Healthy in his sister and son; Heart attack in his father and mother.  ROS:   Please see the history of present illness.    All other systems reviewed and are negative.  EKGs/Labs/Other Studies Reviewed:    The following studies were reviewed today:  Ablation records  EKG:  The ekg ordered today demonstrates atypical atrial flutter, likely left atrial.  Ventricular rate of 148 bpm.  Recent Labs: 11/01/2020: B Natriuretic Peptide 438.2; TSH 0.809 11/03/2020: Magnesium 2.0 12/28/2020: ALT 15; BUN 14; Creatinine, Ser 1.25; Hemoglobin 15.4; Platelets 355; Potassium 3.6; Sodium 139  Recent Lipid Panel    Component Value Date/Time   CHOL 137 10/05/2020 0844   TRIG 34 10/05/2020 0844   HDL 54 10/05/2020 0844   CHOLHDL 2.5 10/05/2020 0844   VLDL 7 10/05/2020 0844   LDLCALC 76 10/05/2020 0844    Physical Exam:    VS:  BP 130/78    Pulse (!) 148    Ht 6\' 5"  (1.956 m)    Wt 215 lb (97.5 kg)    SpO2 99%    BMI 25.50 kg/m     Wt Readings from Last 3 Encounters:  03/30/21 215 lb (97.5 kg)  01/28/21 221 lb 9.6 oz (100.5 kg)  01/18/21 219 lb 9.6 oz (99.6 kg)     GEN:  Well nourished, well developed in no acute distress HEENT: Normal NECK: No JVD; No carotid bruits LYMPHATICS: No lymphadenopathy CARDIAC: Regular rhythm, tachycardic, no murmurs, rubs, gallops RESPIRATORY:  Clear to auscultation without rales, wheezing or rhonchi   ABDOMEN: Soft, non-tender, non-distended MUSCULOSKELETAL:  No edema; No deformity  SKIN: Warm and dry NEUROLOGIC:  Alert and oriented x 3 PSYCHIATRIC:  Normal affect        ASSESSMENT:    1. Persistent atrial fibrillation ([place])   2. Typical atrial flutter ([place])   3. Chronic systolic heart failure (HCC)    PLAN:    In order of problems listed above:  #Persistent atrial fibrillation/atypical atrial flutter Symptomatic.  I suspect he has a Perry mitral flutter versus alternative left atrial circuit.  For now, would like to initiate dofetilide to get him back in a normal rhythm.  We will work  on scheduling this admission soon as possible.  I would also like him to restart metoprolol.  Will write for metoprolol succinate 50 mg by mouth once daily. He will continue to take Eliquis twice daily for stroke prophylaxis. We will target next week admission for dofetilide.  Creatinine okay for Tikosyn. Sinus rhythm EKG with an acceptable QTC.  #Chronic systolic heart failure NYHA class II.  I do think a rhythm control strategy is important.  We will continue Eliquis and plan to start dofetilide next week (Tuesday).    Medication Adjustments/Labs and Tests Ordered: Current medicines are reviewed at length with the patient today.  Concerns regarding medicines are outlined above.  Orders Placed This Encounter  Procedures   EKG 12-Lead   Meds ordered this encounter  Medications   metoprolol succinate (TOPROL-XL) 50 MG 24 hr tablet    Sig: Take 1 tablet (50 mg total) by mouth daily. Take with or immediately following a meal.    Dispense:  90 tablet    Refill:  3     Signed, Lars Mage, MD, [facility], [facility] 03/30/2021 9:05 AM    Electrophysiology [facility]

## 2021-03-30 ENCOUNTER — Ambulatory Visit (INDEPENDENT_AMBULATORY_CARE_PROVIDER_SITE_OTHER): Payer: Commercial Managed Care - PPO | Admitting: Cardiology

## 2021-03-30 ENCOUNTER — Other Ambulatory Visit: Payer: Self-pay

## 2021-03-30 ENCOUNTER — Telehealth: Payer: Self-pay | Admitting: Pharmacist

## 2021-03-30 ENCOUNTER — Encounter: Payer: Self-pay | Admitting: Cardiology

## 2021-03-30 VITALS — BP 130/78 | HR 148 | Ht 77.0 in | Wt 215.0 lb

## 2021-03-30 DIAGNOSIS — I5022 Chronic systolic (congestive) heart failure: Secondary | ICD-10-CM

## 2021-03-30 DIAGNOSIS — I4819 Other persistent atrial fibrillation: Secondary | ICD-10-CM

## 2021-03-30 DIAGNOSIS — I483 Typical atrial flutter: Secondary | ICD-10-CM

## 2021-03-30 MED ORDER — METOPROLOL SUCCINATE ER 50 MG PO TB24: 50.0000 mg | ORAL_TABLET | Freq: Every day | ORAL | 3 refills | Status: DC

## 2021-03-30 NOTE — Telephone Encounter (Signed)
Medication list reviewed in anticipation of upcoming Tikosyn initiation. Patient is not taking any contraindicated or QTc prolonging medications.   Patient is anticoagulated on Eliquis on the appropriate dose. Please ensure that patient has not missed any anticoagulation doses in the 3 weeks prior to Tikosyn initiation.   Patient will need to be counseled to avoid use of Benadryl while on Tikosyn and in the 2-3 days prior to Tikosyn initiation.  

## 2021-03-30 NOTE — Patient Instructions (Addendum)
Medication Instructions:  Your physician has recommended you make the following change in your medication:    START taking metoprolol succinate 50 mg-  Take one tablet by mouth daily   Lab Work: None ordered. If you have labs (blood work) drawn today and your tests are completely normal, you will receive your results only by: [place] (if you have MyChart) OR A paper copy in the mail If you have any lab test that is abnormal or we need to change your treatment, we will call you to review the results.  Testing/Procedures: None ordered.  Follow-Up:  You will be scheduled for dofetilide admission by [facility] RN with the afib clinic.  Tikosyn (Dofetilide) Hospital Admission  Prior to day of admission: Check with drug insurance company for cost of drug to ensure affordability --- Dofetilide 500 mcg twice a day.  GoodRx is an option if insurance copay is unaffordable.  All patients are tested for COVID-19 prior to admission.  No Benadryl is allowed 3 days prior to admission.  Please ensure no missed doses of your anticoagulation (blood thinner) for 3 weeks prior to admission. If a dose is missed please notify our office immediately.  A pharmacist will review all your medications for potential interactions with Tikosyn. If any medication changes are needed prior to admission we will be in touch with you.  If any new medications are started AFTER your admission date is set with Nurse, adult. Please notify our office immediately so your medication list can be updated and reviewed by our pharmacist again. On day of admission: Tikosyn initiation requires a 3 night/4 day hospital stay with constant telemetry monitoring. You will have an EKG after each dose of Tikosyn as well as daily lab draws.  If the drug does not convert you to normal rhythm a cardioversion after the 4th dose of Tikosyn.  Afib Clinic office visit on the morning of admission is needed for preliminary labs/ekg.  Time of  admission is dependent on bed availability in the hospital. In some instances, you will be sent home until bed is available. Rarely admission can be delayed to the following day if hospital census prevents available beds.  You may bring personal belongings/clothing with you to the hospital. Please leave your suitcase in the car until you arrive in admissions.  Questions please call our office at [phone]

## 2021-04-01 ENCOUNTER — Other Ambulatory Visit: Payer: Self-pay | Admitting: Internal Medicine

## 2021-04-01 ENCOUNTER — Other Ambulatory Visit: Payer: Self-pay | Admitting: Cardiology

## 2021-04-01 DIAGNOSIS — M059 Rheumatoid arthritis with rheumatoid factor, unspecified: Secondary | ICD-10-CM

## 2021-04-01 LAB — SARS CORONAVIRUS 2 (TAT 6-24 HRS): SARS Coronavirus 2: NEGATIVE

## 2021-04-01 NOTE — Telephone Encounter (Signed)
Please schedule patient for a follow up visit. Patient due December 2022. Thanks!

## 2021-04-01 NOTE — Telephone Encounter (Signed)
Next Visit: no follow up scheduled. Due December 2022. Message sent to the front to schedule.   Last Visit: 12/24/2020  Last Fill: 12/24/2020  Dx: Seropositive rheumatoid arthritis   Current Dose per office note on 6/77/3736: folic acid 1 mg daily   Okay to refill Folic Acid?

## 2021-04-05 ENCOUNTER — Other Ambulatory Visit (HOSPITAL_COMMUNITY): Payer: Self-pay

## 2021-04-05 ENCOUNTER — Other Ambulatory Visit: Payer: Self-pay

## 2021-04-05 ENCOUNTER — Encounter (HOSPITAL_COMMUNITY): Payer: Self-pay | Admitting: Physician Assistant

## 2021-04-05 ENCOUNTER — Inpatient Hospital Stay (HOSPITAL_COMMUNITY)
Admission: AD | Admit: 2021-04-05 | Discharge: 2021-04-08 | DRG: 309 | Disposition: A | Discharge status: Home / Self Care | Payer: Commercial Managed Care - PPO | Source: Ambulatory Visit | Attending: Cardiology | Admitting: Cardiology

## 2021-04-05 ENCOUNTER — Ambulatory Visit (HOSPITAL_COMMUNITY)
Admission: RE | Admit: 2021-04-05 | Discharge: 2021-04-05 | Disposition: A | Discharge status: Home / Self Care | Payer: Commercial Managed Care - PPO | Source: Ambulatory Visit | Attending: Physician Assistant | Admitting: Physician Assistant

## 2021-04-05 ENCOUNTER — Encounter (HOSPITAL_COMMUNITY): Payer: Self-pay | Admitting: Internal Medicine

## 2021-04-05 ENCOUNTER — Telehealth (HOSPITAL_COMMUNITY): Payer: Self-pay | Admitting: *Deleted

## 2021-04-05 VITALS — BP 108/88 | HR 136 | Ht 77.0 in | Wt 214.0 lb

## 2021-04-05 DIAGNOSIS — I484 Atypical atrial flutter: Secondary | ICD-10-CM | POA: Diagnosis present

## 2021-04-05 DIAGNOSIS — I428 Other cardiomyopathies: Secondary | ICD-10-CM | POA: Diagnosis present

## 2021-04-05 DIAGNOSIS — Z7901 Long term (current) use of anticoagulants: Secondary | ICD-10-CM | POA: Diagnosis not present

## 2021-04-05 DIAGNOSIS — M069 Rheumatoid arthritis, unspecified: Secondary | ICD-10-CM | POA: Diagnosis present

## 2021-04-05 DIAGNOSIS — Z8249 Family history of ischemic heart disease and other diseases of the circulatory system: Secondary | ICD-10-CM | POA: Diagnosis not present

## 2021-04-05 DIAGNOSIS — Z720 Tobacco use: Secondary | ICD-10-CM

## 2021-04-05 DIAGNOSIS — Z880 Allergy status to penicillin: Secondary | ICD-10-CM

## 2021-04-05 DIAGNOSIS — I4819 Other persistent atrial fibrillation: Secondary | ICD-10-CM | POA: Diagnosis present

## 2021-04-05 DIAGNOSIS — I4892 Unspecified atrial flutter: Secondary | ICD-10-CM | POA: Diagnosis present

## 2021-04-05 DIAGNOSIS — R9431 Abnormal electrocardiogram [ECG] [EKG]: Secondary | ICD-10-CM | POA: Diagnosis present

## 2021-04-05 DIAGNOSIS — Z885 Allergy status to narcotic agent status: Secondary | ICD-10-CM | POA: Diagnosis not present

## 2021-04-05 DIAGNOSIS — I5022 Chronic systolic (congestive) heart failure: Secondary | ICD-10-CM | POA: Diagnosis present

## 2021-04-05 DIAGNOSIS — M059 Rheumatoid arthritis with rheumatoid factor, unspecified: Secondary | ICD-10-CM

## 2021-04-05 DIAGNOSIS — Z79899 Other long term (current) drug therapy: Secondary | ICD-10-CM

## 2021-04-05 DIAGNOSIS — Z7982 Long term (current) use of aspirin: Secondary | ICD-10-CM | POA: Diagnosis not present

## 2021-04-05 DIAGNOSIS — D6869 Other thrombophilia: Secondary | ICD-10-CM

## 2021-04-05 LAB — BASIC METABOLIC PANEL
Anion gap: 8 (ref 5–15)
Anion gap: 9 (ref 5–15)
Anion gap: 7 (ref 5–15)
BUN: 8 mg/dL (ref 8–23)
BUN: 6 mg/dL — ABNORMAL LOW (ref 8–23)
BUN: 9 mg/dL (ref 8–23)
CO2: 25 mmol/L (ref 22–32)
CO2: 23 mmol/L (ref 22–32)
CO2: 22 mmol/L (ref 22–32)
Calcium: 8.6 mg/dL — ABNORMAL LOW (ref 8.9–10.3)
Calcium: 8.4 mg/dL — ABNORMAL LOW (ref 8.9–10.3)
Calcium: 8.4 mg/dL — ABNORMAL LOW (ref 8.9–10.3)
Chloride: 106 mmol/L (ref 98–111)
Chloride: 105 mmol/L (ref 98–111)
Chloride: 108 mmol/L (ref 98–111)
Creatinine, Ser: 0.92 mg/dL (ref 0.61–1.24)
Creatinine, Ser: 0.87 mg/dL (ref 0.61–1.24)
Creatinine, Ser: 0.76 mg/dL (ref 0.61–1.24)
GFR, Estimated: >60 mL/min (ref >60)
GFR, Estimated: >60 mL/min (ref >60)
GFR, Estimated: >60 mL/min (ref >60)
Glucose, Bld: 101 mg/dL — ABNORMAL HIGH (ref 70–99)
Glucose, Bld: 166 mg/dL — ABNORMAL HIGH (ref 70–99)
Glucose, Bld: 105 mg/dL — ABNORMAL HIGH (ref 70–99)
Potassium: 3.7 mmol/L (ref 3.5–5.1)
Potassium: 3.8 mmol/L (ref 3.5–5.1)
Potassium: 4.5 mmol/L (ref 3.5–5.1)
Sodium: 139 mmol/L (ref 135–145)
Sodium: 137 mmol/L (ref 135–145)
Sodium: 137 mmol/L (ref 135–145)

## 2021-04-05 LAB — MAGNESIUM: Magnesium: 2 mg/dL (ref 1.7–2.4)

## 2021-04-05 MED ORDER — METOPROLOL SUCCINATE ER 50 MG PO TB24
50.0000 mg | ORAL_TABLET | Freq: Every day | ORAL | Status: DC
  Administered 2021-04-06 – 2021-04-08 (×3): 50 mg via ORAL
  Filled 2021-04-05 (×3): qty 1

## 2021-04-05 MED ORDER — APIXABAN 5 MG PO TABS
5.0000 mg | ORAL_TABLET | Freq: Two times a day (BID) | ORAL | Status: DC
  Administered 2021-04-05 – 2021-04-08 (×6): 5 mg via ORAL
  Filled 2021-04-05 (×6): qty 1

## 2021-04-05 MED ORDER — ASCORBIC ACID 500 MG PO TABS
500.0000 mg | ORAL_TABLET | Freq: Every day | ORAL | Status: DC
  Administered 2021-04-06 – 2021-04-08 (×3): 500 mg via ORAL
  Filled 2021-04-05 (×3): qty 1

## 2021-04-05 MED ORDER — VITAMIN D 25 MCG (1000 UNIT) PO TABS
3000.0000 [IU] | ORAL_TABLET | Freq: Every day | ORAL | Status: DC
  Administered 2021-04-06 – 2021-04-08 (×3): 3000 [IU] via ORAL
  Filled 2021-04-05 (×3): qty 3

## 2021-04-05 MED ORDER — SODIUM CHLORIDE 0.9 % IV SOLN: 250.0000 mL | INTRAVENOUS | Status: DC | PRN

## 2021-04-05 MED ORDER — SACUBITRIL-VALSARTAN 24-26 MG PO TABS
1.0000 | ORAL_TABLET | Freq: Two times a day (BID) | ORAL | Status: DC
  Administered 2021-04-05 – 2021-04-08 (×6): 1 via ORAL
  Filled 2021-04-05 (×6): qty 1

## 2021-04-05 MED ORDER — POTASSIUM CHLORIDE CRYS ER 20 MEQ PO TBCR
60.0000 meq | EXTENDED_RELEASE_TABLET | ORAL | Status: AC
  Administered 2021-04-05: 60 meq via ORAL
  Filled 2021-04-05: qty 3

## 2021-04-05 MED ORDER — SODIUM CHLORIDE 0.9% FLUSH
3.0000 mL | Freq: Two times a day (BID) | INTRAVENOUS | Status: DC
  Administered 2021-04-05 – 2021-04-08 (×4): 3 mL via INTRAVENOUS

## 2021-04-05 MED ORDER — FOLIC ACID 1 MG PO TABS
1.0000 mg | ORAL_TABLET | Freq: Every day | ORAL | Status: DC
  Administered 2021-04-06 – 2021-04-08 (×3): 1 mg via ORAL
  Filled 2021-04-05 (×3): qty 1

## 2021-04-05 MED ORDER — DOFETILIDE 500 MCG PO CAPS
500.0000 ug | ORAL_CAPSULE | Freq: Two times a day (BID) | ORAL | Status: DC
  Administered 2021-04-05 – 2021-04-06 (×3): 500 ug via ORAL
  Filled 2021-04-05 (×3): qty 1

## 2021-04-05 MED ORDER — ELDERBERRY 500 MG PO CAPS: 1.0000 | ORAL_CAPSULE | Freq: Every day | ORAL | Status: DC

## 2021-04-05 MED ORDER — SODIUM CHLORIDE 0.9% FLUSH: 3.0000 mL | INTRAVENOUS | Status: DC | PRN

## 2021-04-05 MED ORDER — ACETAMINOPHEN 500 MG PO TABS
500.0000 mg | ORAL_TABLET | Freq: Four times a day (QID) | ORAL | Status: DC | PRN
  Administered 2021-04-08: 500 mg via ORAL
  Filled 2021-04-05: qty 1

## 2021-04-05 MED ORDER — MAGNESIUM SULFATE 2 GM/50ML IV SOLN
2.0000 g | Freq: Once | INTRAVENOUS | Status: AC
  Administered 2021-04-05: 2 g via INTRAVENOUS
  Filled 2021-04-05: qty 50

## 2021-04-05 MED ORDER — LORATADINE 10 MG PO TABS: 10.0000 mg | ORAL_TABLET | Freq: Every day | ORAL | Status: DC | PRN

## 2021-04-05 NOTE — Care Management (Signed)
04-05-21 1526 Patient presented for Tikosyn Load. Benefits check submitted for cost. Case Manager will follow for cost and pharmacy of choice for refills.

## 2021-04-05 NOTE — Progress Notes (Addendum)
Primary Care Physician: Lois Huxley, PA Primary Cardiologist: Dr Johney Frame Primary Electrophysiologist: Dr Quentin Ore  Referring Physician: Dr Dwana Melena Peter Mcmillan is a 66 y.o. male with a history of atrial flutter, atrial fibrillation, systolic dysfunction, RA who presents for follow up in the [facility]. Patient presented to the hospital 10/05/20 with afib RVR and was started on Eliquis for a CHADS2VASC score of 1. He underwent TEE guided DCCV on 10/06/20. He was hospitalized again 11/01/20 for progressive dyspnea and lower extremity edema. He was cardioverted in the ED then admitted for observation and diuresis. He lost almost 30 lbs of fluid. Patient presented to the ER  8/14 with atrial flutter with RVR. He was successfully cardioverted.   Patient underwent afib and flutter ablation with Dr Quentin Ore on 12/17/20. Unfortunately, he had recurrence of rapid afib and had another DCCV in the ED on 12/28/20. His metoprolol was also increased.   On follow up today, patient had significant recurrence of his atrial flutter and he presents today for dofetilide admission. He denies any missed doses of anticoagulation in the past three weeks. He remains difficult to rate control despite high dose BB.   Today, he denies symptoms of chest pain, shortness of breath, orthopnea, PND, lower extremity edema, dizziness, presyncope, syncope, snoring, daytime somnolence, bleeding, or neurologic sequela. The patient is tolerating medications without difficulties and is otherwise without complaint today.    Atrial Fibrillation Risk Factors:  he does not have symptoms or diagnosis of sleep apnea. he does not have a history of rheumatic fever. he does not have a history of alcohol use. The patient does have a history of early familial atrial fibrillation or other arrhythmias. Mother has afib.  he has a BMI of Body mass index is 25.38 kg/m.Marland Kitchen Filed Weights   04/05/21 1122   Weight: 97.1 kg      Family History  Problem Relation Age of Onset   Heart attack Father    Heart attack Mother    Dementia Mother    Healthy Sister    Healthy Son      Atrial Fibrillation Management history:  Previous antiarrhythmic drugs: none Previous cardioversions: 10/06/20, 11/01/20, 11/14/20, 12/28/20 Previous ablations: 12/17/20 CHADS2VASC score: 1 Anticoagulation history: Eliquis   Past Medical History:  Diagnosis Date   Atrial flutter ([place])    Erectile dysfunction    Rheumatoid arthritis ([place])    Tobacco abuse    Smokeless tobacco   Past Surgical History:  Procedure Laterality Date   ATRIAL FIBRILLATION ABLATION N/A 12/17/2020   Procedure: ATRIAL FIBRILLATION ABLATION;  Surgeon: Vickie Epley, MD;  Location: [place] [facility];  Service: Cardiovascular;  Laterality: N/A;   [place] N/A 10/06/2020   Procedure: CARDIOVERSION;  Surgeon: Donato Heinz, MD;  Location: [place];  Service: Cardiovascular;  Laterality: N/A;   MELANOMA EXCISION  2020   TEE WITHOUT CARDIOVERSION N/A 10/06/2020   Procedure: TRANSESOPHAGEAL ECHOCARDIOGRAM (TEE);  Surgeon: Donato Heinz, MD;  Location: [facility];  Service: Cardiovascular;  Laterality: N/A;   TEE WITHOUT CARDIOVERSION N/A 12/17/2020   Procedure: TRANSESOPHAGEAL ECHOCARDIOGRAM (TEE);  Surgeon: Geralynn Rile, MD;  Location: [place] [facility];  Service: Cardiovascular;  Laterality: N/A;    Current Outpatient Medications  Medication Sig Dispense Refill   acetaminophen (TYLENOL) 500 MG tablet Take 500 mg by mouth every 6 (six) hours as needed (Arthritis).     apixaban (ELIQUIS) 5 MG TABS  tablet Take 1 tablet (5 mg total) by mouth 2 (two) times daily. 60 tablet 11   cholecalciferol (VITAMIN D3) 25 MCG (1000 UNIT) tablet Take 3 tablets (3,000 Units total) by mouth daily. 270 tablet 3   Elderberry 500 MG CAPS Take 1 tablet by mouth daily.     folic acid (FOLVITE) 1 MG  tablet Take 1 tablet (1 mg total) by mouth daily. 90 tablet 0   loratadine (CLARITIN) 10 MG tablet Take 10 mg by mouth daily as needed for allergies.     methotrexate (RHEUMATREX) 2.5 MG tablet Take 6 tablets (15 mg total) by mouth once a week. Takes on Sunday. 30 tablet 2   metoprolol succinate (TOPROL-XL) 50 MG 24 hr tablet Take 1 tablet (50 mg total) by mouth daily. Take with or immediately following a meal. 90 tablet 3   sacubitril-valsartan (ENTRESTO) 24-26 MG Take 1 tablet by mouth 2 (two) times daily. 60 tablet 0   vitamin C (ASCORBIC ACID) 500 MG tablet Take 500 mg by mouth daily.     No current facility-administered medications for this encounter.    Allergies  Allergen Reactions   Codeine Other (See Comments)    agitation   Other Hives, Swelling and Other (See Comments)    make feel weird; hives/swelling   Penicillin G Other (See Comments)   Penicillins Itching, Swelling and Rash    Reaction: 5 years ago    Social History   Socioeconomic History   Marital status: Married    Spouse name: Not on file   Number of children: 1   Years of education: Not on file   Highest education level: Associate degree: occupational, Hotel manager, or vocational program  Occupational History   Occupation: Clinical biochemist  Tobacco Use   Smoking status: Never   Smokeless tobacco: Current    Types: Snuff   Tobacco comments:    1/2 can Snuff daily 04/05/2021  Vaping Use   Vaping Use: Never used  Substance and Sexual Activity   Alcohol use: Not Currently    Alcohol/week: 1.0 standard drink    Types: 1 Cans of beer per week    Comment: 1 beer last weekend 04/05/2021   Drug use: No   Sexual activity: Not on file  Other Topics Concern   Not on file  Social History Narrative   Not on file   Social Determinants of Health   Financial Resource Strain: High Risk   Difficulty of Paying Living Expenses: Hard  Food Insecurity: No Food Insecurity   Worried About Running Out of Food in the Last  Year: Never true   Ran Out of Food in the Last Year: Never true  Transportation Needs: No Transportation Needs   Lack of Transportation (Medical): No   Lack of Transportation (Non-Medical): No  Physical Activity: Not on file  Stress: Not on file  Social Connections: Not on file  Intimate Partner Violence: Not on file     ROS- All systems are reviewed and negative except as per the HPI above.  Physical Exam: Vitals:   04/05/21 1122  BP: 108/88  Pulse: (!) 136  Weight: 97.1 kg  Height: 6\' 5"  (1.956 m)    GEN- The patient is a well appearing male, alert and oriented x 3 today.   HEENT-head normocephalic, atraumatic, sclera clear, conjunctiva pink, hearing intact, trachea midline. Lungs- Clear to ausculation bilaterally, normal work of breathing Heart- irregular rate and rhythm, no murmurs, rubs or gallops  GI- soft, NT, ND, + BS  Extremities- no clubbing, cyanosis, or edema MS- no significant deformity or atrophy Skin- no rash or lesion Psych- euthymic mood, full affect Neuro- strength and sensation are intact   Wt Readings from Last 3 Encounters:  04/05/21 97.1 kg  03/30/21 97.5 kg  01/28/21 100.5 kg    EKG today demonstrates  Atypical atrial flutter with variable block Vent. rate 136 BPM PR interval * ms QRS duration 80 ms QT/QTcB 294/442 ms  TEE 10/06/20 demonstrated  1. Left ventricular ejection fraction, by estimation, is 40 to 45%. The  left ventricle has mildly decreased function. The left ventricle  demonstrates global hypokinesis.   2. Right ventricular systolic function is normal. The right ventricular  size is mildly enlarged.   3. Left atrial size was mildly dilated. No left atrial/left atrial  appendage thrombus was detected.   4. The mitral valve is normal in structure. Mild mitral valve  regurgitation.   5. The aortic valve is tricuspid. Aortic valve regurgitation is not  visualized.   Conclusion(s)/Recommendation(s): No LA/LAA thrombus  identified. Successful  cardioversion performed with restoration of normal sinus rhythm.   Epic records are reviewed at length today  CHA2DS2-VASc Score = 2  The patient's score is based upon: CHF History: 1 HTN History: 0 Diabetes History: 0 Stroke History: 0 Vascular Disease History: 0 Age Score: 1 Gender Score: 0         ASSESSMENT AND PLAN: 1. Persistent Atrial Fibrillation/atrial flutter The patient's CHA2DS2-VASc score is 2, indicating a 2.2% annual risk of stroke.   S/p afib and flutter ablation with Dr Quentin Ore 12/17/20  Patient presents for dofetilide admission. Patient aware of price of dofetilide. Continue Eliquis 5 mg BID, states no missed doses in the last 3 weeks. No recent benadryl use PharmD has screened medications Labs today show creatinine at 0.92, K+ 3.7 and mag 2.0, CrCl calculated at 110 mL/min Continue metoprolol 50 mg daily  2. Chronic systolic CHF EF 89-16% No signs or symptoms of fluid overload.   To be admitted later today once a bed becomes available.     [facility] [address] [place], [place] [address] [phone]

## 2021-04-05 NOTE — Plan of Care (Signed)
°  Problem: Education: Goal: Knowledge of General Education information will improve Description: Including pain rating scale, medication(s)/side effects and non-pharmacologic comfort measures Outcome: Progressing   Problem: Clinical Measurements: Goal: Ability to maintain clinical measurements within normal limits will improve Outcome: Progressing   Problem: Clinical Measurements: Goal: Cardiovascular complication will be avoided Outcome: Progressing   Problem: Clinical Measurements: Goal: Diagnostic test results will improve Outcome: Progressing

## 2021-04-05 NOTE — TOC Benefit Eligibility Note (Signed)
Patient Teacher, English as a foreign language completed.    The patient is currently admitted and upon discharge could be taking dofetilide(Tikosyn) 500 mcg.  The current 30 day co-pay is, $10.00.   The patient is insured through [employer] (Only [place])     Lyndel Safe, [place] Patient Advocate Specialist [place] Patient Advocate Team Direct Number: [phone]  Fax: [phone]

## 2021-04-05 NOTE — Progress Notes (Signed)
Pharmacy: Dofetilide (Tikosyn) - Initial Consult Assessment and Electrolyte Replacement  Pharmacy consulted to assist in monitoring and replacing electrolytes in this 66 y.o. male admitted on 04/05/2021 undergoing dofetilide initiation. First dofetilide dose: 04/05/21.  Assessment:  Patient Exclusion Criteria: If any screening criteria checked as "Yes", then  patient  should NOT receive dofetilide until criteria item is corrected.  If Yes please indicate correction plan.  YES  NO Patient  Exclusion Criteria Correction Plan   []   [x]   Baseline QTc interval is greater than or equal to 440 msec. IF above YES box checked dofetilide contraindicated unless patient has ICD; then may proceed if QTc 500-550 msec or with known ventricular conduction abnormalities may proceed with QTc 550-600 msec. QTc = 442    []   [x]   Patient is known or suspected to have a digoxin level greater than 2 ng/ml: No results found for: DIGOXIN     []   [x]   Creatinine clearance less than 20 ml/min (calculated using Cockcroft-Gault, actual body weight and serum creatinine): Estimated Creatinine Clearance: 100.9 mL/min (by C-G formula based on SCr of 0.92 mg/dL).     []   [x]  Patient has received drugs known to prolong the QT intervals within the last 48 hours (phenothiazines, tricyclics or tetracyclic antidepressants, erythromycin, H-1 antihistamines, cisapride, fluoroquinolones, azithromycin, ondansetron).   Updated information on QT prolonging agents is available to be searched on the following database:QT prolonging agents     []   [x]   Patient received a dose of hydrochlorothiazide (Oretic) alone or in any combination including triamterene (Dyazide, Maxzide) in the last 48 hours.    []   [x]  Patient received a medication known to increase dofetilide plasma concentrations prior to initial dofetilide dose:  Trimethoprim (Primsol, Proloprim) in the last 36 hours Verapamil (Calan, Verelan) in the last 36 hours  or a sustained release dose in the last 72 hours Megestrol (Megace) in the last 5 days  Cimetidine (Tagamet) in the last 6 hours Ketoconazole (Nizoral) in the last 24 hours Itraconazole (Sporanox) in the last 48 hours  Prochlorperazine (Compazine) in the last 36 hours     []   [x]   Patient is known to have a history of torsades de pointes; congenital or acquired long QT syndromes.    []   [x]   Patient has received a Class 1 antiarrhythmic with less than 2 half-lives since last dose. (Disopyramide, Quinidine, Procainamide, Lidocaine, Mexiletine, Flecainide, Propafenone)    []   [x]   Patient has received amiodarone therapy in the past 3 months or amiodarone level is greater than 0.3 ng/ml.    Patient has been appropriately anticoagulated with apixaban.  Labs:    Component Value Date/Time   K 3.7 04/05/2021 1053   MG 2.0 04/05/2021 1053     Plan: Potassium: K 3.5-3.7:  Hold Tikosyn initiation and give KCl 60 mEq po x1 and repeat BMET 2hr after dose - repeat appropriate dose if K < 4    Magnesium: Mg 1.8-2: Give Mg 2 gm IV x1 to prevent Mg from dropping below 1.8 - do not need to recheck Mg. Appropriate to initiate Tikosyn   Thank you for allowing pharmacy to participate in this patient's care   Erin Hearing PharmD., BCPS Clinical Pharmacist 04/05/2021 3:17 PM

## 2021-04-05 NOTE — Telephone Encounter (Signed)
Patient approved for inpatient hospital stay through [facility] number [id number] authorized 04/05/21-04/08/21.

## 2021-04-05 NOTE — H&P (Signed)
Primary Care Physician: Lois Huxley, PA Primary Cardiologist: Dr Johney Frame Primary Electrophysiologist: Dr Quentin Ore  Referring Physician: Dr Dwana Melena Peter Mcmillan is a 66 y.o. male with a history of atrial flutter, atrial fibrillation, systolic dysfunction, RA who presents for follow up in the [facility]. Patient presented to the hospital 10/05/20 with afib RVR and was started on Eliquis for a CHADS2VASC score of 1. He underwent TEE guided DCCV on 10/06/20. He was hospitalized again 11/01/20 for progressive dyspnea and lower extremity edema. He was cardioverted in the ED then admitted for observation and diuresis. He lost almost 30 lbs of fluid. Patient presented to the ER  8/14 with atrial flutter with RVR. He was successfully cardioverted.    Patient underwent afib and flutter ablation with Dr Quentin Ore on 12/17/20. Unfortunately, he had recurrence of rapid afib and had another DCCV in the ED on 12/28/20. His metoprolol was also increased.    On follow up today, patient had significant recurrence of his atrial flutter and he presents today for dofetilide admission. He denies any missed doses of anticoagulation in the past three weeks. He remains difficult to rate control despite high dose BB.    Today, he denies symptoms of chest pain, shortness of breath, orthopnea, PND, lower extremity edema, dizziness, presyncope, syncope, snoring, daytime somnolence, bleeding, or neurologic sequela. The patient is tolerating medications without difficulties and is otherwise without complaint today.      Atrial Fibrillation Risk Factors:   he does not have symptoms or diagnosis of sleep apnea. he does not have a history of rheumatic fever. he does not have a history of alcohol use. The patient does have a history of early familial atrial fibrillation or other arrhythmias. Mother has afib.   he has a BMI of Body mass index is 25.38 kg/m.Marland Kitchen    Filed Weights     04/05/21 1122  Weight: 97.1 kg               Family History  Problem Relation Age of Onset   Heart attack Father     Heart attack Mother     Dementia Mother     Healthy Sister     Healthy Son          Atrial Fibrillation Management history:   Previous antiarrhythmic drugs: none Previous cardioversions: 10/06/20, 11/01/20, 11/14/20, 12/28/20 Previous ablations: 12/17/20 CHADS2VASC score: 1 Anticoagulation history: Eliquis         Past Medical History:  Diagnosis Date   Atrial flutter ([place])     Erectile dysfunction     Rheumatoid arthritis ([place])     Tobacco abuse      Smokeless tobacco         Past Surgical History:  Procedure Laterality Date   ATRIAL FIBRILLATION ABLATION N/A 12/17/2020    Procedure: ATRIAL FIBRILLATION ABLATION;  Surgeon: Vickie Epley, MD;  Location: [facility];  Service: Cardiovascular;  Laterality: N/A;   [place] N/A 10/06/2020    Procedure: CARDIOVERSION;  Surgeon: Donato Heinz, MD;  Location: [facility];  Service: Cardiovascular;  Laterality: N/A;   MELANOMA EXCISION   2020   TEE WITHOUT CARDIOVERSION N/A 10/06/2020    Procedure: TRANSESOPHAGEAL ECHOCARDIOGRAM (TEE);  Surgeon: Donato Heinz, MD;  Location: [facility];  Service: Cardiovascular;  Laterality: N/A;   TEE WITHOUT CARDIOVERSION N/A 12/17/2020    Procedure: TRANSESOPHAGEAL ECHOCARDIOGRAM (TEE);  Surgeon: O'Neal,  Cassie Freer, MD;  Location: [facility];  Service: Cardiovascular;  Laterality: N/A;            Current Outpatient Medications  Medication Sig Dispense Refill   acetaminophen (TYLENOL) 500 MG tablet Take 500 mg by mouth every 6 (six) hours as needed (Arthritis).       apixaban (ELIQUIS) 5 MG TABS tablet Take 1 tablet (5 mg total) by mouth 2 (two) times daily. 60 tablet 11   cholecalciferol (VITAMIN D3) 25 MCG (1000 UNIT) tablet Take 3 tablets (3,000 Units total) by mouth daily. 270 tablet 3   Elderberry 500 MG  CAPS Take 1 tablet by mouth daily.       folic acid (FOLVITE) 1 MG tablet Take 1 tablet (1 mg total) by mouth daily. 90 tablet 0   loratadine (CLARITIN) 10 MG tablet Take 10 mg by mouth daily as needed for allergies.       methotrexate (RHEUMATREX) 2.5 MG tablet Take 6 tablets (15 mg total) by mouth once a week. Takes on Sunday. 30 tablet 2   metoprolol succinate (TOPROL-XL) 50 MG 24 hr tablet Take 1 tablet (50 mg total) by mouth daily. Take with or immediately following a meal. 90 tablet 3   sacubitril-valsartan (ENTRESTO) 24-26 MG Take 1 tablet by mouth 2 (two) times daily. 60 tablet 0   vitamin C (ASCORBIC ACID) 500 MG tablet Take 500 mg by mouth daily.        No current facility-administered medications for this encounter.           Allergies  Allergen Reactions   Codeine Other (See Comments)      agitation   Other Hives, Swelling and Other (See Comments)      make feel weird; hives/swelling   Penicillin G Other (See Comments)   Penicillins Itching, Swelling and Rash      Reaction: 5 years ago      Social History         Socioeconomic History   Marital status: Married      Spouse name: Not on file   Number of children: 1   Years of education: Not on file   Highest education level: Associate degree: occupational, Hotel manager, or vocational program  Occupational History   Occupation: Clinical biochemist  Tobacco Use   Smoking status: Never   Smokeless tobacco: Current      Types: Snuff   Tobacco comments:      1/2 can Snuff daily 04/05/2021  Vaping Use   Vaping Use: Never used  Substance and Sexual Activity   Alcohol use: Not Currently      Alcohol/week: 1.0 standard drink      Types: 1 Cans of beer per week      Comment: 1 beer last weekend 04/05/2021   Drug use: No   Sexual activity: Not on file  Other Topics Concern   Not on file  Social History Narrative   Not on file    Social Determinants of Health       Financial Resource Strain: High Risk   Difficulty of  Paying Living Expenses: Hard  Food Insecurity: No Food Insecurity   Worried About Running Out of Food in the Last Year: Never true   Ran Out of Food in the Last Year: Never true  Transportation Needs: No Transportation Needs   Lack of Transportation (Medical): No   Lack of Transportation (Non-Medical): No  Physical Activity: Not on file  Stress: Not on file  Social  Connections: Not on file  Intimate Partner Violence: Not on file        ROS- All systems are reviewed and negative except as per the HPI above.   Physical Exam:    Vitals:    04/05/21 1122  BP: 108/88  Pulse: (!) 136  Weight: 97.1 kg  Height: 6\' 5"  (1.956 m)      GEN- The patient is a well appearing male, alert and oriented x 3 today.   HEENT-head normocephalic, atraumatic, sclera clear, conjunctiva pink, hearing intact, trachea midline. Lungs- Clear to ausculation bilaterally, normal work of breathing Heart- irregular rate and rhythm, no murmurs, rubs or gallops  GI- soft, NT, ND, + BS Extremities- no clubbing, cyanosis, or edema MS- no significant deformity or atrophy Skin- no rash or lesion Psych- euthymic mood, full affect Neuro- strength and sensation are intact        Wt Readings from Last 3 Encounters:  04/05/21 97.1 kg  03/30/21 97.5 kg  01/28/21 100.5 kg      EKG today demonstrates  Atypical atrial flutter with variable block Vent. rate 136 BPM PR interval * ms QRS duration 80 ms QT/QTcB 294/442 ms   TEE 10/06/20 demonstrated  1. Left ventricular ejection fraction, by estimation, is 40 to 45%. The  left ventricle has mildly decreased function. The left ventricle  demonstrates global hypokinesis.   2. Right ventricular systolic function is normal. The right ventricular  size is mildly enlarged.   3. Left atrial size was mildly dilated. No left atrial/left atrial  appendage thrombus was detected.   4. The mitral valve is normal in structure. Mild mitral valve  regurgitation.   5. The  aortic valve is tricuspid. Aortic valve regurgitation is not  visualized.   Conclusion(s)/Recommendation(s): No LA/LAA thrombus identified. Successful  cardioversion performed with restoration of normal sinus rhythm.    Epic records are reviewed at length today   CHA2DS2-VASc Score = 2  The patient's score is based upon: CHF History: 1 HTN History: 0 Diabetes History: 0 Stroke History: 0 Vascular Disease History: 0 Age Score: 1 Gender Score: 0           ASSESSMENT AND PLAN: 1. Persistent Atrial Fibrillation/atrial flutter The patient's CHA2DS2-VASc score is 2, indicating a 2.2% annual risk of stroke.   S/p afib and flutter ablation with Dr Quentin Ore 12/17/20  Patient presents for dofetilide admission. Patient aware of price of dofetilide. Continue Eliquis 5 mg BID, states no missed doses in the last 3 weeks. No recent benadryl use PharmD has screened medications Labs today show creatinine at 0.92, K+ 3.7 and mag 2.0, CrCl calculated at 110 mL/min Continue metoprolol 50 mg daily   2. Chronic systolic CHF EF 72-62% No signs or symptoms of fluid overload.     To be admitted later today once a bed becomes available.      [facility] [address] [place], [place] [address] [phone]   EP Attending  Patient seen and examined. Agree with above. The patient presents today for initiation of dofetilide by Dr. Quentin Ore. He has recurrent atrial flutter which is thought to be due to left sided flutter. Ihave reviewed the indications risks/benefits/goals/expectations of initiation of dofetilide and he wishes to proceed.  Carleene Overlie Roselind Klus,MD

## 2021-04-05 NOTE — Progress Notes (Signed)
°   04/05/21 1450  Assess: MEWS Score  Temp 99.3 F (37.4 C)  BP 122/81  Pulse Rate (!) 122  ECG Heart Rate (!) 126  Resp 18  Level of Consciousness Alert  SpO2 98 %  O2 Device Room Air  Assess: MEWS Score  MEWS Temp 0  MEWS Systolic 0  MEWS Pulse 2  MEWS RR 0  MEWS LOC 0  MEWS Score 2  MEWS Score Color Yellow  Assess: if the MEWS score is Yellow or Red  Were vital signs taken at a resting state? Yes  Focused Assessment No change from prior assessment  Early Detection of Sepsis Score *See Row Information* Low  MEWS guidelines implemented *See Row Information* Yes  Treat  MEWS Interventions Escalated (See documentation below)  Pain Scale 0-10  Pain Score 0  Take Vital Signs  Increase Vital Sign Frequency  Yellow: Q 2hr X 2 then Q 4hr X 2, if remains yellow, continue Q 4hrs  Escalate  MEWS: Escalate Yellow: discuss with charge nurse/RN and consider discussing with provider and RRT  Notify: Charge Nurse/RN  Name of Charge Nurse/RN Notified Wallene Dales RN  Date Charge Nurse/RN Notified 04/05/21  Time Charge Nurse/RN Notified 1453  Document  Patient Outcome Other (Comment) (pt in A flutter)  Progress note created (see row info) Yes

## 2021-04-06 ENCOUNTER — Other Ambulatory Visit (HOSPITAL_COMMUNITY): Payer: Self-pay

## 2021-04-06 DIAGNOSIS — I484 Atypical atrial flutter: Secondary | ICD-10-CM | POA: Diagnosis not present

## 2021-04-06 LAB — BASIC METABOLIC PANEL
Anion gap: 7 (ref 5–15)
BUN: 8 mg/dL (ref 8–23)
CO2: 21 mmol/L — ABNORMAL LOW (ref 22–32)
Calcium: 8.5 mg/dL — ABNORMAL LOW (ref 8.9–10.3)
Chloride: 109 mmol/L (ref 98–111)
Creatinine, Ser: 0.78 mg/dL (ref 0.61–1.24)
GFR, Estimated: >60 mL/min (ref >60)
Glucose, Bld: 100 mg/dL — ABNORMAL HIGH (ref 70–99)
Potassium: 4.7 mmol/L (ref 3.5–5.1)
Sodium: 137 mmol/L (ref 135–145)

## 2021-04-06 LAB — MAGNESIUM: Magnesium: 2.2 mg/dL (ref 1.7–2.4)

## 2021-04-06 MED ORDER — SODIUM CHLORIDE 0.9 % IV SOLN: INTRAVENOUS | Status: DC

## 2021-04-06 NOTE — Care Management (Signed)
1243 04-06-21 Patient presented for Tikosyn Load. Case Manager spoke with the patient regarding cost and he is agreeable to cost. Patient would like for initial Rx to be filled via [facility] and Rx refills to be submitted to [facility]. No further needs identified at this time.

## 2021-04-06 NOTE — Progress Notes (Addendum)
Progress Note  Patient Name: Peter Mcmillan Date of Encounter: 04/06/2021  [facility] Cardiologist: Freada Bergeron, MD   Subjective   Bed is too short!, otherwise doing ok, no complaints  Inpatient Medications    Scheduled Meds:  apixaban  5 mg Oral BID   vitamin C  500 mg Oral Daily   cholecalciferol  3,000 Units Oral Daily   dofetilide  500 mcg Oral BID   folic acid  1 mg Oral Daily   metoprolol succinate  50 mg Oral Daily   sacubitril-valsartan  1 tablet Oral BID   sodium chloride flush  3 mL Intravenous Q12H   Continuous Infusions:  sodium chloride     PRN Meds: sodium chloride, acetaminophen, loratadine, sodium chloride flush   Vital Signs    Vitals:   04/05/21 2026 04/06/21 0026 04/06/21 0430 04/06/21 0812  BP: 125/88 (!) 120/91 (!) 104/91 105/83  Pulse: (!) 126 (!) 114 (!) 137 (!) 122  Resp: 19 17 19 18   Temp: 98.1 F (36.7 C) 98.1 F (36.7 C) 97.7 F (36.5 C) 97.8 F (36.6 C)  TempSrc: Oral Oral Oral Oral  SpO2: 100% 99% 95%   Weight:      Height:        Intake/Output Summary (Last 24 hours) at 04/06/2021 1015 Last data filed at 04/05/2021 2200 Gross per 24 hour  Intake 265.41 ml  Output --  Net 265.41 ml   Last 3 Weights 04/05/2021 04/05/2021 03/30/2021  Weight (lbs) 214 lb 214 lb 215 lb  Weight (kg) 97.07 kg 97.07 kg 97.523 kg      Telemetry    AFlutter 120's - Personally Reviewed  ECG    AFlutter 117bpm, QTc is difficult, machine reads 460ms - Personally Reviewed  Physical Exam   GEN: No acute distress.   Neck: No JVD Cardiac: irreg-irreg, tachycardic, no murmurs, rubs, or gallops.  Respiratory: CTA b/l. GI: Soft, nontender, non-distended  MS: No edema; No deformity. Neuro:  Nonfocal  Psych: Normal affect   Labs    High Sensitivity Troponin:  No results for input(s): TROPONINIHS in the last 720 hours.   Chemistry Recent Labs  Lab 04/05/21 1053 04/05/21 1757 04/05/21 2151 04/06/21 0234  NA 139 137 137 137  K  3.7 3.8 4.5 4.7  CL 106 105 108 109  CO2 25 23 22  21*  GLUCOSE 101* 166* 105* 100*  BUN 8 6* 9 8  CREATININE 0.92 0.87 0.76 0.78  CALCIUM 8.6* 8.4* 8.4* 8.5*  MG 2.0  --   --  2.2  GFRNONAA >60 >60 >60 >60  ANIONGAP 8 9 7 7     Lipids No results for input(s): CHOL, TRIG, HDL, LABVLDL, LDLCALC, CHOLHDL in the last 168 hours.  HematologyNo results for input(s): WBC, RBC, HGB, HCT, MCV, MCH, MCHC, RDW, PLT in the last 168 hours. Thyroid No results for input(s): TSH, FREET4 in the last 168 hours.  BNPNo results for input(s): BNP, PROBNP in the last 168 hours.  DDimer No results for input(s): DDIMER in the last 168 hours.   Radiology    No results found.  Cardiac Studies   12/17/20 IMPRESSIONS   1. Pre Ablation TEE. No LAA thrombus present.   2. Left ventricular ejection fraction, by estimation, is 60 to 65%. The  left ventricle has normal function. The left ventricle has no regional  wall motion abnormalities.   3. Right ventricular systolic function is normal. The right ventricular  size is normal.  4. No left atrial/left atrial appendage thrombus was detected.   5. The mitral valve is grossly normal. Trivial mitral valve  regurgitation. No evidence of mitral stenosis.   6. The aortic valve is tricuspid. Aortic valve regurgitation is not  visualized. No aortic stenosis is present.   Patient Profile     66 y.o. male w/Hx of RA, NICM, persistent Afib/aflutter (s/p CTI/PVI ablation 12/17/20 with recurrent arrhythmia admitted for Tikosyn initiation  Assessment & Plan    Persistent AFib, aflutter CHA2DS2Vasc is 2, on Eliquis Tikosyn load is in progress K+ 4.7 Mag 2.2 Creat 0.78 QTc difficult to tell but appears stable  DCCV tomorrow if not in SR, pt is aware and agreeable   NICM Chronic CHF Most recent echo with normalized LVEF Does not appear volume OL   RA Home meds   For questions or updates, please contact [facility] Please consult [email or web address] for  contact info under     Signed, Baldwin Jamaica, PA-C  04/06/2021, 10:15 AM    EP attending  Patient seen and examined.  Agree with the findings above.  On exam he is a well-appearing 66 year old man in no distress.  Cardiovascular exam reveals an irregular rhythm.  Lungs are clear bilaterally.  Extremities demonstrated no edema.  The patient is doing well on dofetilide.  His QT is not measurable secondary to the very coarse flutter waves present.  He will continue dofetilide.  If he does not revert to sinus rhythm, he will undergo cardioversion tomorrow.  No change in medications at this time.  Cristopher Peru, MD

## 2021-04-06 NOTE — Plan of Care (Signed)

## 2021-04-06 NOTE — Progress Notes (Signed)
EKG done 2 hours post administration of Tikosyn (3rd dose). Qtc = 516. Stable vital signs. Informed Dr. Chyrel Masson. No order made.   Willadean Carol Jamee Keach, BSN, [employer]

## 2021-04-06 NOTE — Plan of Care (Signed)
°  Problem: Education: °Goal: Knowledge of General Education information will improve °Description: Including pain rating scale, medication(s)/side effects and non-pharmacologic comfort measures °Outcome: Progressing °  °Problem: Clinical Measurements: °Goal: Ability to maintain clinical measurements within normal limits will improve °Outcome: Progressing °  °Problem: Clinical Measurements: °Goal: Cardiovascular complication will be avoided °Outcome: Progressing °  °

## 2021-04-06 NOTE — Progress Notes (Signed)
Informed provider that pt experienced 5 seconds of SVT hr 157, now back to NSR. Pt was asleep and asymptomatic during event, call bell within reach, will continue to monitor

## 2021-04-06 NOTE — Plan of Care (Signed)
  Problem: Cardiac: Goal: Ability to achieve and maintain adequate cardiopulmonary perfusion will improve Outcome: Progressing   

## 2021-04-06 NOTE — Progress Notes (Signed)
Sinus tach on monitor. EKG done 2 hours post Tikoysn - still on Atrial flutter. Will continue to monitor.

## 2021-04-07 ENCOUNTER — Encounter (HOSPITAL_COMMUNITY): Payer: Self-pay | Admitting: Certified Registered Nurse Anesthetist

## 2021-04-07 ENCOUNTER — Encounter (HOSPITAL_COMMUNITY): Payer: Self-pay | Admitting: Internal Medicine

## 2021-04-07 ENCOUNTER — Encounter (HOSPITAL_COMMUNITY)
Admission: AD | Disposition: A | Discharge status: Home / Self Care | Payer: Self-pay | Source: Ambulatory Visit | Attending: Cardiology

## 2021-04-07 DIAGNOSIS — I484 Atypical atrial flutter: Secondary | ICD-10-CM | POA: Diagnosis not present

## 2021-04-07 LAB — BASIC METABOLIC PANEL
Anion gap: 5 (ref 5–15)
BUN: 11 mg/dL (ref 8–23)
CO2: 23 mmol/L (ref 22–32)
Calcium: 8.7 mg/dL — ABNORMAL LOW (ref 8.9–10.3)
Chloride: 105 mmol/L (ref 98–111)
Creatinine, Ser: 0.87 mg/dL (ref 0.61–1.24)
GFR, Estimated: >60 mL/min (ref >60)
Glucose, Bld: 92 mg/dL (ref 70–99)
Potassium: 4.3 mmol/L (ref 3.5–5.1)
Sodium: 133 mmol/L — ABNORMAL LOW (ref 135–145)

## 2021-04-07 LAB — MAGNESIUM: Magnesium: 2.2 mg/dL (ref 1.7–2.4)

## 2021-04-07 SURGERY — CANCELLED PROCEDURE

## 2021-04-07 MED ORDER — DOFETILIDE 250 MCG PO CAPS
250.0000 ug | ORAL_CAPSULE | Freq: Two times a day (BID) | ORAL | Status: DC
  Administered 2021-04-07 – 2021-04-08 (×3): 250 ug via ORAL
  Filled 2021-04-07 (×3): qty 1

## 2021-04-07 NOTE — Progress Notes (Signed)
Pharmacy: Dofetilide (Tikosyn) - Follow Up Assessment and Electrolyte Replacement  Pharmacy consulted to assist in monitoring and replacing electrolytes in this 66 y.o. male admitted on 04/05/2021 undergoing dofetilide initiation.  Labs:    Component Value Date/Time   K 4.3 04/07/2021 0230   MG 2.2 04/07/2021 0230     Plan: Potassium: K >/= 4: No additional supplementation needed  Magnesium: Mg > 2: No additional supplementation needed   Thank you for allowing pharmacy to participate in this patient's care   Hildred Laser, PharmD Clinical Pharmacist **Pharmacist phone directory can now be found on [email or web address] ([email or web address]).  Listed under [place].

## 2021-04-07 NOTE — Progress Notes (Signed)
Progress Note  Patient Name: Peter Mcmillan Date of Encounter: 04/07/2021  [facility] Cardiologist: Freada Bergeron, MD   Subjective   Can not tell really any change in SR  Inpatient Medications    Scheduled Meds:  apixaban  5 mg Oral BID   vitamin C  500 mg Oral Daily   cholecalciferol  3,000 Units Oral Daily   dofetilide  250 mcg Oral BID   folic acid  1 mg Oral Daily   metoprolol succinate  50 mg Oral Daily   sacubitril-valsartan  1 tablet Oral BID   sodium chloride flush  3 mL Intravenous Q12H   Continuous Infusions:  sodium chloride     sodium chloride 20 mL/hr at 04/06/21 1956   PRN Meds: sodium chloride, acetaminophen, loratadine, sodium chloride flush   Vital Signs    Vitals:   04/06/21 1142 04/06/21 1642 04/06/21 2047 04/07/21 0535  BP: 111/77 114/81 109/69 109/78  Pulse:  83 84 84  Resp: 18 18 19 17   Temp: 97.8 F (36.6 C) 97.8 F (36.6 C) 97.8 F (36.6 C) 97.8 F (36.6 C)  TempSrc: Oral Oral Oral Oral  SpO2:   97% 97%  Weight:      Height:        Intake/Output Summary (Last 24 hours) at 04/07/2021 0748 Last data filed at 04/06/2021 1956 Gross per 24 hour  Intake 233.77 ml  Output --  Net 233.77 ml   Last 3 Weights 04/05/2021 04/05/2021 03/30/2021  Weight (lbs) 214 lb 214 lb 215 lb  Weight (kg) 97.07 kg 97.07 kg 97.523 kg      Telemetry     SR 60's-80's - Personally Reviewed  ECG    SR 77bpm, manually measured QT 464ms, Qtc 474ms - Personally Reviewed  Physical Exam   GEN: No acute distress.   Neck: No JVD Cardiac: RRR, no murmurs, rubs, or gallops.  Respiratory: CTA b/l. GI: Soft, nontender, non-distended  MS: No edema; No deformity. Neuro:  Nonfocal  Psych: Normal affect   Labs    High Sensitivity Troponin:  No results for input(s): TROPONINIHS in the last 720 hours.   Chemistry Recent Labs  Lab 04/05/21 1053 04/05/21 1757 04/05/21 2151 04/06/21 0234 04/07/21 0230  NA 139   < > 137 137 133*  K 3.7   < >  4.5 4.7 4.3  CL 106   < > 108 109 105  CO2 25   < > 22 21* 23  GLUCOSE 101*   < > 105* 100* 92  BUN 8   < > 9 8 11   CREATININE 0.92   < > 0.76 0.78 0.87  CALCIUM 8.6*   < > 8.4* 8.5* 8.7*  MG 2.0  --   --  2.2 2.2  GFRNONAA >60   < > >60 >60 >60  ANIONGAP 8   < > 7 7 5    < > = values in this interval not displayed.    Lipids No results for input(s): CHOL, TRIG, HDL, LABVLDL, LDLCALC, CHOLHDL in the last 168 hours.  HematologyNo results for input(s): WBC, RBC, HGB, HCT, MCV, MCH, MCHC, RDW, PLT in the last 168 hours. Thyroid No results for input(s): TSH, FREET4 in the last 168 hours.  BNPNo results for input(s): BNP, PROBNP in the last 168 hours.  DDimer No results for input(s): DDIMER in the last 168 hours.   Radiology    No results found.  Cardiac Studies   12/17/20 IMPRESSIONS  1. Pre Ablation TEE. No LAA thrombus present.   2. Left ventricular ejection fraction, by estimation, is 60 to 65%. The  left ventricle has normal function. The left ventricle has no regional  wall motion abnormalities.   3. Right ventricular systolic function is normal. The right ventricular  size is normal.   4. No left atrial/left atrial appendage thrombus was detected.   5. The mitral valve is grossly normal. Trivial mitral valve  regurgitation. No evidence of mitral stenosis.   6. The aortic valve is tricuspid. Aortic valve regurgitation is not  visualized. No aortic stenosis is present.   Patient Profile     66 y.o. male w/Hx of RA, NICM, persistent Afib/aflutter (s/p CTI/PVI ablation 12/17/20 with recurrent arrhythmia admitted for Tikosyn initiation  Assessment & Plan    Persistent AFib, aflutter CHA2DS2Vasc is 2, on Eliquis Tikosyn load is in progress K+ 4.3 Mag 2.2 Creat 0.87  He converted with drug yesterday He has had QT prolongation, will reduce dose this AM  Anticipate discharge tomorrow  NICM Chronic CHF Most recent echo with normalized LVEF Does not appear volume OL    RA Home meds   For questions or updates, please contact [facility] Please consult [email or web address] for contact info under     Signed, Baldwin Jamaica, PA-C  04/07/2021, 7:48 AM

## 2021-04-08 ENCOUNTER — Other Ambulatory Visit (HOSPITAL_COMMUNITY): Payer: Self-pay

## 2021-04-08 ENCOUNTER — Encounter: Payer: Self-pay | Admitting: Physician Assistant

## 2021-04-08 DIAGNOSIS — I484 Atypical atrial flutter: Secondary | ICD-10-CM | POA: Diagnosis not present

## 2021-04-08 LAB — BASIC METABOLIC PANEL
Anion gap: 6 (ref 5–15)
BUN: 12 mg/dL (ref 8–23)
CO2: 24 mmol/L (ref 22–32)
Calcium: 8.6 mg/dL — ABNORMAL LOW (ref 8.9–10.3)
Chloride: 105 mmol/L (ref 98–111)
Creatinine, Ser: 0.97 mg/dL (ref 0.61–1.24)
GFR, Estimated: >60 mL/min (ref >60)
Glucose, Bld: 97 mg/dL (ref 70–99)
Potassium: 4.4 mmol/L (ref 3.5–5.1)
Sodium: 135 mmol/L (ref 135–145)

## 2021-04-08 LAB — MAGNESIUM: Magnesium: 2.2 mg/dL (ref 1.7–2.4)

## 2021-04-08 MED ORDER — DOFETILIDE 250 MCG PO CAPS
250.0000 ug | ORAL_CAPSULE | Freq: Two times a day (BID) | ORAL | 5 refills | Status: DC
  Filled 2021-04-08: qty 60, 30d supply, fill #0

## 2021-04-08 NOTE — Progress Notes (Signed)
Pharmacy: Dofetilide (Tikosyn) - Follow Up Assessment and Electrolyte Replacement  Pharmacy consulted to assist in monitoring and replacing electrolytes in this 66 y.o. male admitted on 04/05/2021 undergoing dofetilide initiation.   Labs:    Component Value Date/Time   K 4.4 04/08/2021 0206   MG 2.2 04/08/2021 0206     Plan: Potassium: K >/= 4: No additional supplementation needed  Magnesium: Mg > 2: No additional supplementation needed  Thank you for allowing pharmacy to participate in this patient's care   Hildred Laser, PharmD Clinical Pharmacist **Pharmacist phone directory can now be found on [email or web address] ([email or web address]).  Listed under [place].

## 2021-04-08 NOTE — Progress Notes (Signed)
Discharge instructions (including medications) discussed with and copy provided to patient/caregiver 

## 2021-04-08 NOTE — Progress Notes (Signed)
Patient has received his last dose of Tikosyn. Pending EKG in two hours.

## 2021-04-08 NOTE — Discharge Summary (Signed)
ELECTROPHYSIOLOGY PROCEDURE DISCHARGE SUMMARY    Patient ID: Peter Mcmillan,  MRN: [id number], DOB/AGE: 05-05-1955 66 y.o.  Admit date: 04/05/2021 Discharge date: 04/08/2021  Primary Care Physician: Lois Huxley, PA Primary Cardiologist: Dr. Johney Frame Electrophysiologist: Dr. Quentin Ore  Primary Discharge Diagnosis:  1.  persistent atrial fibrillation status post Tikosyn loading this admission      CHA2DS2Vasc is 2, on Eliquis  Secondary Discharge Diagnosis:  NICM CHF (systolic) Recovered LVEF  Allergies  Allergen Reactions   Codeine Other (See Comments)    agitation   Other Hives, Swelling and Other (See Comments)    make feel weird; hives/swelling   Penicillin G Other (See Comments)   Penicillins Itching, Swelling and Rash    Reaction: 5 years ago     Procedures This Admission:  1.  Tikosyn loading    Brief HPI: Peter Mcmillan is a 66 y.o. male with a past medical history as noted above.  They were referred to EP in the outpatient setting for treatment options of atrial fibrillation.  Risks, benefits, and alternatives to Tikosyn were reviewed with the patient who wished to proceed.    Hospital Course:  The patient was admitted and Tikosyn was initiated.  Renal function and electrolytes were followed during the hospitalization.  The patient's QTc became prolonged requiring reduction in dose, though then remained stable.  He converted with drug and did not required DCCV  The patient was monitored until discharge on telemetry which demonstrated SR, occ PACs.  On the day of discharge, he feels well, was examined by Dr Quentin Ore who considered the patient stable for discharge to home.  Follow-up has been arranged with the AFib clinic in 1 week and with Dr Quentin Ore in 4 weeks.   Tikosyn teaching was completed No new electrolyte replacement for home  Physical Exam: Vitals:   04/07/21 1115 04/07/21 2030 04/08/21 0602 04/08/21 0851  BP: 107/75 116/76 122/75  107/68  Pulse: 76 70 74 76  Resp: 13 16 14    Temp: 99.4 F (37.4 C) 98.4 F (36.9 C) 98.4 F (36.9 C)   TempSrc: Oral Oral Oral   SpO2:  98% 99%   Weight:      Height:         GEN- The patient is well appearing, alert and oriented x 3 today.   HEENT: normocephalic, atraumatic; sclera clear, conjunctiva pink; hearing intact; oropharynx clear; neck supple, no JVP Lymph- no cervical lymphadenopathy Lungs- CTA b/l, normal work of breathing.  No wheezes, rales, rhonchi Heart- RRR, no murmurs, rubs or gallops, PMI not laterally displaced GI- soft, non-tender, non-distended Extremities- no clubbing, cyanosis, or edema MS- no significant deformity or atrophy Skin- warm and dry, no rash or lesion Psych- euthymic mood, full affect Neuro- strength and sensation are intact   Labs:   Lab Results  Component Value Date   WBC 9.1 12/28/2020   HGB 15.4 12/28/2020   HCT 44.3 12/28/2020   MCV 88.8 12/28/2020   PLT 355 12/28/2020    Recent Labs  Lab 04/08/21 0206  NA 135  K 4.4  CL 105  CO2 24  BUN 12  CREATININE 0.97  CALCIUM 8.6*  GLUCOSE 97     Discharge Medications:  Allergies as of 04/08/2021       Reactions   Codeine Other (See Comments)   agitation   Other Hives, Swelling, Other (See Comments)   make feel weird; hives/swelling   Penicillin G Other (See Comments)   Penicillins  Itching, Swelling, Rash   Reaction: 5 years ago        Medication List     TAKE these medications    acetaminophen 500 MG tablet Commonly known as: TYLENOL Take 500 mg by mouth every 6 (six) hours as needed (Arthritis).   apixaban 5 MG Tabs tablet Commonly known as: ELIQUIS Take 1 tablet (5 mg total) by mouth 2 (two) times daily.   cholecalciferol 25 MCG (1000 UNIT) tablet Commonly known as: VITAMIN D3 Take 3 tablets (3,000 Units total) by mouth daily.   dofetilide 250 MCG capsule Commonly known as: TIKOSYN Take 1 capsule (250 mcg total) by mouth 2 (two) times daily.    Elderberry 500 MG Caps Take 1 tablet by mouth daily.   Entresto 24-26 MG Generic drug: sacubitril-valsartan Take 1 tablet by mouth 2 (two) times daily.   folic acid 1 MG tablet Commonly known as: FOLVITE TAKE 1 TABLET BY MOUTH ONCE A DAY   loratadine 10 MG tablet Commonly known as: CLARITIN Take 10 mg by mouth daily as needed for allergies.   methotrexate 2.5 MG tablet Commonly known as: RHEUMATREX Take 6 tablets (15 mg total) by mouth once a week. Takes on Sunday. What changed: additional instructions   metoprolol succinate 50 MG 24 hr tablet Commonly known as: TOPROL-XL Take 1 tablet (50 mg total) by mouth daily. Take with or immediately following a meal.   vitamin C 500 MG tablet Commonly known as: ASCORBIC ACID Take 500 mg by mouth daily.        Disposition: Home Discharge Instructions     Diet - low sodium heart healthy   Complete by: As directed    Increase activity slowly   Complete by: As directed         Duration of Discharge Encounter: Greater than 30 minutes including physician time.  Venetia Night, PA-C 04/08/2021 11:45 AM

## 2021-04-08 NOTE — Plan of Care (Signed)
  Problem: Education: Goal: Knowledge of General Education information will improve Description: Including pain rating scale, medication(s)/side effects and non-pharmacologic comfort measures Outcome: Adequate for Discharge   

## 2021-04-15 ENCOUNTER — Other Ambulatory Visit: Payer: Self-pay

## 2021-04-15 ENCOUNTER — Ambulatory Visit (HOSPITAL_COMMUNITY)
Admit: 2021-04-15 | Discharge: 2021-04-15 | Disposition: A | Discharge status: Home / Self Care | Payer: Commercial Managed Care - PPO | Attending: Physician Assistant | Admitting: Physician Assistant

## 2021-04-15 VITALS — BP 134/88 | HR 88 | Ht 77.0 in | Wt 214.0 lb

## 2021-04-15 DIAGNOSIS — Z7901 Long term (current) use of anticoagulants: Secondary | ICD-10-CM | POA: Diagnosis not present

## 2021-04-15 DIAGNOSIS — I5022 Chronic systolic (congestive) heart failure: Secondary | ICD-10-CM | POA: Insufficient documentation

## 2021-04-15 DIAGNOSIS — I4892 Unspecified atrial flutter: Secondary | ICD-10-CM | POA: Diagnosis not present

## 2021-04-15 DIAGNOSIS — R0600 Dyspnea, unspecified: Secondary | ICD-10-CM | POA: Insufficient documentation

## 2021-04-15 DIAGNOSIS — M069 Rheumatoid arthritis, unspecified: Secondary | ICD-10-CM | POA: Diagnosis not present

## 2021-04-15 DIAGNOSIS — I4819 Other persistent atrial fibrillation: Secondary | ICD-10-CM

## 2021-04-15 DIAGNOSIS — D6869 Other thrombophilia: Secondary | ICD-10-CM | POA: Diagnosis not present

## 2021-04-15 LAB — BASIC METABOLIC PANEL
Anion gap: 10 (ref 5–15)
BUN: 10 mg/dL (ref 8–23)
CO2: 23 mmol/L (ref 22–32)
Calcium: 9 mg/dL (ref 8.9–10.3)
Chloride: 107 mmol/L (ref 98–111)
Creatinine, Ser: 0.77 mg/dL (ref 0.61–1.24)
GFR, Estimated: >60 mL/min (ref >60)
Glucose, Bld: 105 mg/dL — ABNORMAL HIGH (ref 70–99)
Potassium: 4.1 mmol/L (ref 3.5–5.1)
Sodium: 140 mmol/L (ref 135–145)

## 2021-04-15 LAB — MAGNESIUM: Magnesium: 2 mg/dL (ref 1.7–2.4)

## 2021-04-15 MED ORDER — DOFETILIDE 250 MCG PO CAPS: 250.0000 ug | ORAL_CAPSULE | Freq: Two times a day (BID) | ORAL | 6 refills | Status: DC

## 2021-04-15 NOTE — Progress Notes (Signed)
Primary Care Physician: Lois Huxley, PA Primary Cardiologist: Dr Johney Frame Primary Electrophysiologist: Dr Quentin Ore  Referring Physician: Dr Dwana Melena Peter Mcmillan is a 66 y.o. male with a history of atrial flutter, atrial fibrillation, systolic dysfunction, RA who presents for follow up in the [facility]. Patient presented to the hospital 10/05/20 with afib RVR and was started on Eliquis for a CHADS2VASC score of 1. He underwent TEE guided DCCV on 10/06/20. He was hospitalized again 11/01/20 for progressive dyspnea and lower extremity edema. He was cardioverted in the ED then admitted for observation and diuresis. He lost almost 30 lbs of fluid. Patient presented to the ER  8/14 with atrial flutter with RVR. He was successfully cardioverted.   Patient underwent afib and flutter ablation with Dr Quentin Ore on 12/17/20. Unfortunately, he had recurrence of rapid afib and had another DCCV in the ED on 12/28/20. His metoprolol was also increased.   On follow up today, patient is s/p dofetilide loading 1/3-04/08/21. He converted with the medication and did not require DCCV. He has not noticed any elevated heart rates since admission. 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 He did miss one dose of dofetilide 04/14/21.   Today, he denies symptoms of palpitations, chest pain, shortness of breath, orthopnea, PND, lower extremity edema, dizziness, presyncope, syncope, snoring, daytime somnolence, bleeding, or neurologic sequela. The patient is tolerating medications without difficulties and is otherwise without complaint today.    Atrial Fibrillation Risk Factors:  he does not have symptoms or diagnosis of sleep apnea. he does not have a history of rheumatic fever. he does not have a history of alcohol use. The patient does have a history of early familial atrial fibrillation or other  arrhythmias. Mother has afib.  he has a BMI of Body mass index is 25.38 kg/m.Marland Kitchen Filed Weights   04/15/21 1023  Weight: 97.1 kg       Family History  Problem Relation Age of Onset   Heart attack Father    Heart attack Mother    Dementia Mother    Healthy Sister    Healthy Son      Atrial Fibrillation Management history:  Previous antiarrhythmic drugs: dofetilide  Previous cardioversions: 10/06/20, 11/01/20, 11/14/20, 12/28/20 Previous ablations: 12/17/20 CHADS2VASC score: 1 Anticoagulation history: Eliquis   Past Medical History:  Diagnosis Date   Atrial flutter ([place])    Erectile dysfunction    Rheumatoid arthritis ([place])    Tobacco abuse    Smokeless tobacco   Past Surgical History:  Procedure Laterality Date   ATRIAL FIBRILLATION ABLATION N/A 12/17/2020   Procedure: ATRIAL FIBRILLATION ABLATION;  Surgeon: Vickie Epley, MD;  Location: [facility];  Service: Cardiovascular;  Laterality: N/A;   [place] N/A 10/06/2020   Procedure: CARDIOVERSION;  Surgeon: Donato Heinz, MD;  Location: [facility];  Service: Cardiovascular;  Laterality: N/A;   MELANOMA EXCISION  2020   TEE WITHOUT CARDIOVERSION N/A 10/06/2020   Procedure: TRANSESOPHAGEAL ECHOCARDIOGRAM (TEE);  Surgeon: Donato Heinz, MD;  Location: [facility];  Service: Cardiovascular;  Laterality: N/A;   TEE WITHOUT CARDIOVERSION N/A 12/17/2020   Procedure: TRANSESOPHAGEAL ECHOCARDIOGRAM (TEE);  Surgeon: Geralynn Rile, MD;  Location: [facility];  Service: Cardiovascular;  Laterality: N/A;    Current Outpatient Medications  Medication Sig Dispense Refill   acetaminophen (TYLENOL) 500 MG tablet Take 500  mg by mouth every 6 (six) hours as needed (Arthritis).     apixaban (ELIQUIS) 5 MG TABS tablet Take 1 tablet (5 mg total) by mouth 2 (two) times daily. 60 tablet 11   cholecalciferol (VITAMIN D3) 25 MCG (1000 UNIT) tablet Take 3 tablets (3,000 Units  total) by mouth daily. 270 tablet 3   dofetilide (TIKOSYN) 250 MCG capsule Take 1 capsule (250 mcg total) by mouth 2 (two) times daily. 60 capsule 5   Elderberry 500 MG CAPS Take 1 tablet by mouth daily.     folic acid (FOLVITE) 1 MG tablet TAKE 1 TABLET BY MOUTH ONCE A DAY 90 tablet 0   loratadine (CLARITIN) 10 MG tablet Take 10 mg by mouth daily as needed for allergies.     methotrexate (RHEUMATREX) 2.5 MG tablet Take 6 tablets (15 mg total) by mouth once a week. Takes on Sunday. (Patient taking differently: Take 15 mg by mouth once a week.) 30 tablet 2   metoprolol succinate (TOPROL-XL) 50 MG 24 hr tablet Take 1 tablet (50 mg total) by mouth daily. Take with or immediately following a meal. 90 tablet 3   sacubitril-valsartan (ENTRESTO) 24-26 MG Take 1 tablet by mouth 2 (two) times daily. 60 tablet 0   vitamin C (ASCORBIC ACID) 500 MG tablet Take 250 mg by mouth daily.     No current facility-administered medications for this encounter.    Allergies  Allergen Reactions   Codeine Other (See Comments)    agitation   Other Hives, Swelling and Other (See Comments)    make feel weird; hives/swelling   Penicillin G Other (See Comments)   Penicillins Itching, Swelling and Rash    Reaction: 5 years ago    Social History   Socioeconomic History   Marital status: Married    Spouse name: Not on file   Number of children: 1   Years of education: Not on file   Highest education level: Associate degree: occupational, Hotel manager, or vocational program  Occupational History   Occupation: Clinical biochemist  Tobacco Use   Smoking status: Never   Smokeless tobacco: Current    Types: Snuff   Tobacco comments:    1/2 can Snuff daily 04/05/2021  Vaping Use   Vaping Use: Never used  Substance and Sexual Activity   Alcohol use: Not Currently    Alcohol/week: 1.0 standard drink    Types: 1 Cans of beer per week    Comment: 1 beer last weekend 04/05/2021   Drug use: No   Sexual activity: Not on file   Other Topics Concern   Not on file  Social History Narrative   Not on file   Social Determinants of Health   Financial Resource Strain: High Risk   Difficulty of Paying Living Expenses: Hard  Food Insecurity: No Food Insecurity   Worried About Running Out of Food in the Last Year: Never true   Ran Out of Food in the Last Year: Never true  Transportation Needs: No Transportation Needs   Lack of Transportation (Medical): No   Lack of Transportation (Non-Medical): No  Physical Activity: Not on file  Stress: Not on file  Social Connections: Not on file  Intimate Partner Violence: Not on file     ROS- All systems are reviewed and negative except as per the HPI above.  Physical Exam: Vitals:   04/15/21 1023  BP: 134/88  Pulse: 88  Weight: 97.1 kg  Height: 6\' 5"  (1.956 m)    GEN- The patient  is a well appearing male, alert and oriented x 3 today.   HEENT-head normocephalic, atraumatic, sclera clear, conjunctiva pink, hearing intact, trachea midline. Lungs- Clear to ausculation bilaterally, normal work of breathing Heart- Regular rate and rhythm, no murmurs, rubs or gallops  GI- soft, NT, ND, + BS Extremities- no clubbing, cyanosis, or edema MS- no significant deformity or atrophy Skin- no rash or lesion Psych- euthymic mood, full affect Neuro- strength and sensation are intact   Wt Readings from Last 3 Encounters:  04/15/21 97.1 kg  04/05/21 97.1 kg  04/05/21 97.1 kg    EKG today demonstrates  SR Vent. rate 88 BPM PR interval 146 ms QRS duration 80 ms QT/QTcB 396/479 ms  TEE 10/06/20 demonstrated  1. Left ventricular ejection fraction, by estimation, is 40 to 45%. The  left ventricle has mildly decreased function. The left ventricle  demonstrates global hypokinesis.   2. Right ventricular systolic function is normal. The right ventricular  size is mildly enlarged.   3. Left atrial size was mildly dilated. No left atrial/left atrial  appendage thrombus was  detected.   4. The mitral valve is normal in structure. Mild mitral valve  regurgitation.   5. The aortic valve is tricuspid. Aortic valve regurgitation is not  visualized.   Conclusion(s)/Recommendation(s): No LA/LAA thrombus identified. Successful cardioversion performed with restoration of normal sinus rhythm.   Epic records are reviewed at length today  CHA2DS2-VASc Score = 2  The patient's score is based upon: CHF History: 1 HTN History: 0 Diabetes History: 0 Stroke History: 0 Vascular Disease History: 0 Age Score: 1 Gender Score: 0         ASSESSMENT AND PLAN: 1. Persistent Atrial Fibrillation/atrial flutter The patient's CHA2DS2-VASc score is 2, indicating a 2.2% annual risk of stroke.   S/p afib and flutter ablation with Dr Quentin Ore 12/17/20  S/p dofetilide loading 1/3-04/08/21 Patient appears to be maintaining SR. Continue dofetilide 250 mcg BID. QT stable. Stressed importance of not missing any doses. Continue Eliquis 5 mg BID Check bmet/mag Continue metoprolol 50 mg daily  2. Chronic systolic CHF EF 97-53% No signs or symptoms of fluid overload   Follow up with Dr Quentin Ore as scheduled. AF clinic in 4 months.     [facility] [address] [place],  [address] [phone]

## 2021-04-16 ENCOUNTER — Other Ambulatory Visit: Payer: Self-pay | Admitting: Internal Medicine

## 2021-04-16 DIAGNOSIS — M059 Rheumatoid arthritis with rheumatoid factor, unspecified: Secondary | ICD-10-CM

## 2021-04-22 ENCOUNTER — Telehealth: Payer: Self-pay | Admitting: Cardiology

## 2021-04-22 NOTE — Telephone Encounter (Signed)
Patient can try Coricidin HBP chest congestion and cough

## 2021-04-22 NOTE — Telephone Encounter (Signed)
Patient called stating he has some congestion and wants to know what kind of medication he can take.

## 2021-04-22 NOTE — Telephone Encounter (Signed)
Notified patient of recommendation.  Verbalized understanding with read back.

## 2021-04-25 ENCOUNTER — Telehealth: Payer: Self-pay | Admitting: Cardiology

## 2021-04-25 ENCOUNTER — Telehealth: Payer: Self-pay

## 2021-04-25 NOTE — Telephone Encounter (Signed)
Called pt advised pt to reach out to PCP provider for this matter.  Pt is concerned that possible medication could interact with Tikosyn.  I advised pt to call back once a medication has been prescribed and we will have our pharmacist review med list to see if all meds are compatible. Pt thanked me for call.  I informed pt that I called him and not spouse. Pt will update spouse.

## 2021-04-25 NOTE — Telephone Encounter (Signed)
Spoke with wife.  Advised Pt cannot take a medication to treat covid until he is tests positive.  He will retest.  Also advised -per pharmacist-he should not take paxlovid on dofetilide.  OK to take molnupavir.  Wife aware and thanked nurse for information.

## 2021-04-25 NOTE — Telephone Encounter (Signed)
Pt c/o medication issue:  1. Name of Medication:  Paxlovid   2. How are you currently taking this medication (dosage and times per day)?   3. Are you having a reaction (difficulty breathing--STAT)?   4. What is your medication issue?   Patient's wife is following up. She states the patient has been sick since Friday, 01/20. However, he tested negative for COVID twice in the past few days. Patient's wife states she has been prescribed Paxlovid and she would like to know if this is something the patient may be eligible to take to relieve his symptoms. Please advise.

## 2021-04-25 NOTE — Telephone Encounter (Signed)
Wife called in to say that she has test positive for covid. She calling in to see what can be doing or meds can be take to make sure the patient dont get it. Please advise

## 2021-04-25 NOTE — Telephone Encounter (Signed)
PA renewal initiated automatically by CoverMyMeds.  Submitted a Prior Authorization request to [employer] for [facility] via CoverMyMeds. Will update once we receive a response.   Key: [id number]

## 2021-04-28 ENCOUNTER — Telehealth: Payer: Self-pay | Admitting: Cardiology

## 2021-04-28 NOTE — Telephone Encounter (Signed)
Pt c/o medication issue:  1. Name of Medication: antibiotic  2. How are you currently taking this medication (dosage and times per day)? Not currently taking  3. Are you having a reaction (difficulty breathing--STAT)? no  4. What is your medication issue? Patient's wife states the patient is negative for covid, but has a sinus infection. She would like to know if Dr. Quentin Ore can prescribe an antibiotic or if the patient needs to go to a walk in clinic.

## 2021-04-28 NOTE — Telephone Encounter (Signed)
Spoke with wife and made her aware that she would need to contact pt's PCP or go to walk n clinic for antibiotics as we would not prescribe.  Wife verbalized understanding.

## 2021-05-02 NOTE — Telephone Encounter (Signed)
Received notification from [place] regarding a prior authorization for [facility]. Authorization has been APPROVED from 04/03/21 to 05/02/22.    Authorization # Key: [id number] - PA Case ID: [id number]

## 2021-05-03 NOTE — Progress Notes (Signed)
Office Visit Note  Patient: Peter Mcmillan             Date of Birth: Jun 13, 1955           MRN: [id number]             PCP: Lois Huxley, PA Referring: Lois Huxley, [place] Visit Date: 05/04/2021   Subjective:   History of Present Illness: Peter Mcmillan is a 66 y.o. 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 weekly.  He stopped taking the Orencia injections for about a month ever since he was hospitalized for his A. fib.  He feels there was not much difference when he took the shots and has not noticed much difference since stopping them.  He continues having some joint pain stiffness and swelling mostly affecting his hands and knees.  He is curious about any other anti-inflammatory medicine or supplements to try in addition to his RA medicines.  Previous HPI 12/24/20 Peter Mcmillan is a 66 y.o. male here for follow up for seropositive RA after starting Orencia 125 mg [place] weekly and continuing methotrexate 15 mg PO weekly and prednisone 10 mg daily. He feels symptoms are improving significantly since starting orencia now at about 5 weeks. He is interested in stopping the prednisone.   Previous HPI 09/23/20 Peter Mcmillan is a 66 y.o. male here for follow-up of seropositive rheumatoid arthritis on methotrexate 15 mg p.o. weekly and prednisone 20 mg daily.  He feels his symptoms have improved taking the medications although worsened greatly when he decrease his prednisone down to 10 mg as directed and so went back to taking 20 daily.  He has been out of the methotrexate since about 2 weeks ago.   Review of Systems  Respiratory:  Negative for shortness of breath and difficulty breathing.   Musculoskeletal:  Positive for joint pain, joint pain, joint swelling and morning stiffness.  Allergic/Immunologic: Negative for susceptible to infections.  Neurological:  Negative for numbness and weakness.   PMFS History:  Patient Active Problem  List   Diagnosis Date Noted   Secondary hypercoagulable state ([place]) 04/05/2021   Atypical atrial flutter ( [place]) 04/05/2021   Atrial fibrillation ([employer]) 12/17/2020   Rapid atrial fibrillation ([place]) 11/01/2020   Atrial flutter with rapid ventricular response ([place]) 10/05/2020   Seropositive rheumatoid arthritis ([place]) 07/15/2020   High risk medication use 07/15/2020   Allergic rhinitis due to pollen 06/11/2020   ED (erectile dysfunction) of organic origin 06/11/2020   Essential hypertension 06/11/2020   Gastro-esophageal reflux disease without esophagitis 06/11/2020   Hyperlipidemia 06/11/2020   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[place]) 06/11/2020   Prediabetes 06/11/2020   Tobacco user 06/11/2020   Vitamin D deficiency 06/11/2020   Bilateral hand pain 06/11/2020   Bilateral shoulder pain 06/11/2020   Atrial flutter ([place]) 12/09/2013   PULMONARY NODULE 08/18/2008   COUGH 08/18/2008    Past Medical History:  Diagnosis Date   Atrial flutter (HCC)    Erectile dysfunction    Rheumatoid arthritis ([place])    Tobacco abuse    Smokeless tobacco    Family History  Problem Relation Age of Onset   Heart attack Father    Heart attack Mother    Dementia Mother    Healthy Sister    Healthy Son    Past Surgical History:  Procedure Laterality Date   ATRIAL FIBRILLATION ABLATION N/A 12/17/2020   Procedure: ATRIAL FIBRILLATION ABLATION;  Surgeon: Vickie Epley, MD;  Location: [facility];  Service: Cardiovascular;  Laterality: N/A;   [place] N/A 10/06/2020   Procedure: CARDIOVERSION;  Surgeon: Donato Heinz, MD;  Location: [facility];  Service: Cardiovascular;  Laterality: N/A;   MELANOMA EXCISION  2020   TEE WITHOUT CARDIOVERSION N/A 10/06/2020   Procedure: TRANSESOPHAGEAL ECHOCARDIOGRAM (TEE);  Surgeon: Donato Heinz, MD;  Location: [place];  Service: Cardiovascular;  Laterality: N/A;   TEE WITHOUT CARDIOVERSION N/A 12/17/2020   Procedure:  TRANSESOPHAGEAL ECHOCARDIOGRAM (TEE);  Surgeon: Geralynn Rile, MD;  Location: [facility];  Service: Cardiovascular;  Laterality: N/A;   Social History   Social History Narrative   Not on file   Immunization History  Administered Date(s) Administered   Hep A / Hep B 02/17/2011   Influenza Split 01/29/2017   Moderna Sars-Covid-2 Vaccination 07/09/2019, 07/09/2019, 08/06/2019, 08/06/2019   Pneumococcal Polysaccharide-23 02/12/2009   Tdap 02/12/2009, 06/13/2017     Objective: Vital Signs: BP (!) 151/93    Pulse 90    Ht 6\' 5"  (1.956 m)    Wt 215 lb (97.5 kg)    BMI 25.50 kg/m    Physical Exam Cardiovascular:     Rate and Rhythm: Normal rate and regular rhythm.  Pulmonary:     Effort: Pulmonary effort is normal.     Breath sounds: Normal breath sounds.  Musculoskeletal:     Right lower leg: No edema.     Left lower leg: No edema.  Skin:    General: Skin is warm and dry.     Findings: No rash.  Neurological:     Mental Status: He is alert.  Psychiatric:        Mood and Affect: Mood normal.     Musculoskeletal Exam:  Neck full ROM no tenderness Shoulders full ROM no tenderness or swelling Elbows full ROM no tenderness or swelling Wrists full ROM no tenderness or swelling Left second third MCP joint enlargement slightly decreased extension range of motion tenderness to palpation tight grip strength with full range of movement Knees full ROM some right knee tenderness no palpable effusions Ankles full ROM no tenderness or swelling   CDAI Exam: CDAI Score: 7  Patient Global: 20 mm; Provider Global: 20 mm Swollen: 0 ; Tender: 3  Joint Exam 05/04/2021      Right  Left  MCP 2      Tender  MCP 3      Tender  Knee   Tender        Investigation: No additional findings.  Imaging: No results found.  Recent Labs: Lab Results  Component Value Date   WBC 9.1 12/28/2020   HGB 15.4 12/28/2020   PLT 355 12/28/2020   NA 140 04/15/2021   K 4.1  04/15/2021   CL 107 04/15/2021   CO2 23 04/15/2021   GLUCOSE 105 (H) 04/15/2021   BUN 10 04/15/2021   CREATININE 0.77 04/15/2021   BILITOT 0.4 12/28/2020   ALKPHOS 44 12/28/2020   AST 12 (L) 12/28/2020   ALT 15 12/28/2020   PROT 6.6 12/28/2020   ALBUMIN 3.8 12/28/2020   CALCIUM 9.0 04/15/2021   GFRAA 111 09/23/2020   QFTBGOLDPLUS NEGATIVE 10/20/2020    Speciality Comments: No specialty comments available.  Procedures:  No procedures performed Allergies: Codeine, Other, Penicillin g, and Penicillins   Assessment / Plan:     Visit Diagnoses: Seropositive rheumatoid arthritis ([place]) - Plan: methotrexate 2.5 MG tablet  RA appears pretty  well controlled just on the methotrexate treatment still having some joint pains and protracted stiffness.  We discussed treatment options including switching to a different injectable drug, titration of methotrexate dose, or continuing the current DMARD.  He prefers to continue the current methotrexate.  Continuing folic acid 1 mg daily.  We discussed anti-inflammatory options I recommended turmeric may be helpful but recommended him to avoid high doses since very high dose could increase his risk combined with Eliquis.  He will stay off the [place] for now I want to next follow-up a little bit sooner within 2 months to recheck the progress and discuss if we do need to titrate his medicine.  High risk medication use  He has recent labs from a hospitalization with normal blood count.  Do not have an updated liver function test he has an upcoming annual visit with his primary care office I recommend he can provide [place] a copy of those lab results to avoid duplication.  If they do not end up checking a metabolic panel will need to get that from him.  Orders: No orders of the defined types were placed in this encounter.  Meds ordered this encounter  Medications   DISCONTD: methotrexate 2.5 MG tablet    Sig: Take 6 tablets (15 mg total) by mouth once a  week.    Dispense:  24 tablet    Refill:  1    PLEASE SEND RX FOR 24 TABS, A 28 DAY SUPPLY FOR INS PURPOSES THANK YOU.   methotrexate 2.5 MG tablet    Sig: Take 6 tablets (15 mg total) by mouth once a week.    Dispense:  24 tablet    Refill:  1    PLEASE SEND RX FOR 24 TABS, A 28 DAY SUPPLY FOR INS PURPOSES THANK YOU.     Follow-Up Instructions: Return in about 6 weeks (around 06/15/2021) for RA on MTX/trying turmeric f/u 4-8wks.   Collier Salina, MD  Note - This record has been created using [employer].  Chart creation errors have been sought, but may not always  have been located. Such creation errors do not reflect on  the standard of medical care.

## 2021-05-04 ENCOUNTER — Other Ambulatory Visit (HOSPITAL_COMMUNITY): Payer: Self-pay

## 2021-05-04 ENCOUNTER — Other Ambulatory Visit: Payer: Self-pay

## 2021-05-04 ENCOUNTER — Encounter: Payer: Self-pay | Admitting: Internal Medicine

## 2021-05-04 ENCOUNTER — Ambulatory Visit (INDEPENDENT_AMBULATORY_CARE_PROVIDER_SITE_OTHER): Payer: Commercial Managed Care - PPO | Admitting: Internal Medicine

## 2021-05-04 VITALS — BP 151/93 | HR 90 | Ht 77.0 in | Wt 215.0 lb

## 2021-05-04 DIAGNOSIS — M059 Rheumatoid arthritis with rheumatoid factor, unspecified: Secondary | ICD-10-CM | POA: Diagnosis not present

## 2021-05-04 DIAGNOSIS — Z79899 Other long term (current) drug therapy: Secondary | ICD-10-CM

## 2021-05-04 MED ORDER — METHOTREXATE SODIUM 2.5 MG PO TABS: 15.0000 mg | ORAL_TABLET | ORAL | 1 refills | Status: DC

## 2021-05-04 MED ORDER — METHOTREXATE SODIUM 2.5 MG PO TABS
15.0000 mg | ORAL_TABLET | ORAL | 1 refills | Status: DC
  Filled 2021-05-04: qty 24, 28d supply, fill #0

## 2021-05-04 NOTE — Patient Instructions (Signed)
Turmeric/curcumin or other curcumin containing compounds are generally safe and often effective in improving arthritis symptoms. At high doses these can increase the risk for bleeding when combined with blood thinners so I would recommend avoiding high doses probably not above 1000-1500 mg per day. If you notice any side effects such as bruising or bleeding or stomach pain those would be a risk for mediation side effects.

## 2021-05-10 ENCOUNTER — Encounter: Payer: Self-pay | Admitting: Cardiology

## 2021-05-10 ENCOUNTER — Ambulatory Visit (INDEPENDENT_AMBULATORY_CARE_PROVIDER_SITE_OTHER): Payer: Commercial Managed Care - PPO | Admitting: Cardiology

## 2021-05-10 ENCOUNTER — Other Ambulatory Visit: Payer: Self-pay

## 2021-05-10 VITALS — BP 126/80 | HR 88 | Ht 77.0 in | Wt 212.0 lb

## 2021-05-10 DIAGNOSIS — I484 Atypical atrial flutter: Secondary | ICD-10-CM | POA: Diagnosis not present

## 2021-05-10 DIAGNOSIS — Z79899 Other long term (current) drug therapy: Secondary | ICD-10-CM | POA: Diagnosis not present

## 2021-05-10 DIAGNOSIS — I4819 Other persistent atrial fibrillation: Secondary | ICD-10-CM

## 2021-05-10 DIAGNOSIS — I10 Essential (primary) hypertension: Secondary | ICD-10-CM | POA: Diagnosis not present

## 2021-05-10 NOTE — Progress Notes (Signed)
Electrophysiology Office Follow up Visit Note:    Date:  05/10/2021   ID:  Peter, Mcmillan 03/06/1956, MRN [id number]  PCP:  Lois Huxley, [place]  [facility] Cardiologist:  Freada Bergeron, MD  [facility] Electrophysiologist:  Vickie Epley, MD    Interval History:    Peter Mcmillan is a 66 y.o. male who presents for a follow up visit. They were last seen in clinic 03/30/2021.  Since their last appointment, he was admitted 04/05/2021 for Tikosyn loading.  Overall he is feeling well. However, since his surgery he has developed a persistent dry cough. He describes this as a "tickle," frequently occurring just before he eats.  Occasionally when he checks his blood pressure in the AM it is high such as 156/90. His midday and evening BP readings are generally lower. He is taking all of his antihypertensives in the morning.  He stays active, and is able to walk 8k+ steps daily. He complains of becoming fatigued easily.  No bleeding issues while on Eliquis.  He denies any palpitations, chest pain, or shortness of breath. No lightheadedness, headaches, syncope, orthopnea, PND, or lower extremity edema.        Past Medical History:  Diagnosis Date   Atrial flutter ([place])    Erectile dysfunction    Rheumatoid arthritis ([place])    Tobacco abuse    Smokeless tobacco    Past Surgical History:  Procedure Laterality Date   ATRIAL FIBRILLATION ABLATION N/A 12/17/2020   Procedure: ATRIAL FIBRILLATION ABLATION;  Surgeon: Vickie Epley, MD;  Location: [facility];  Service: Cardiovascular;  Laterality: N/A;   [place] N/A 10/06/2020   Procedure: CARDIOVERSION;  Surgeon: Donato Heinz, MD;  Location: [facility];  Service: Cardiovascular;  Laterality: N/A;   MELANOMA EXCISION  2020   TEE WITHOUT CARDIOVERSION N/A 10/06/2020   Procedure: TRANSESOPHAGEAL ECHOCARDIOGRAM (TEE);  Surgeon: Donato Heinz, MD;   Location: [facility];  Service: Cardiovascular;  Laterality: N/A;   TEE WITHOUT CARDIOVERSION N/A 12/17/2020   Procedure: TRANSESOPHAGEAL ECHOCARDIOGRAM (TEE);  Surgeon: Geralynn Rile, MD;  Location: [facility];  Service: Cardiovascular;  Laterality: N/A;    Current Medications: Current Meds  Medication Sig   acetaminophen (TYLENOL) 500 MG tablet Take 500 mg by mouth every 6 (six) hours as needed (Arth[place]).   apixaban (ELIQUIS) 5 MG TABS tablet Take 1 tablet (5 mg total) by mouth 2 (two) times daily.   cholecalciferol (VITAMIN D3) 25 MCG (1000 UNIT) tablet Take 3 tablets (3,000 Units total) by mouth daily.   dofetilide (TIKOSYN) 250 MCG capsule Take 1 capsule (250 mcg total) by mouth 2 (two) times daily.   Elderberry 500 MG CAPS Take 1 tablet by mouth daily.   folic acid (FOLVITE) 1 MG tablet TAKE 1 TABLET BY MOUTH ONCE A DAY   loratadine (CLARITIN) 10 MG tablet Take 10 mg by mouth daily as needed for allergies.   methotrexate 2.5 MG tablet Take 6 tablets (15 mg total) by mouth once a week.   metoprolol succinate (TOPROL-XL) 50 MG 24 hr tablet Take 1 tablet (50 mg total) by mouth daily. Take with or immediately following a meal.   sacubitril-valsartan (ENTRESTO) 24-26 MG Take 1 tablet by mouth 2 (two) times daily.   vitamin C (ASCORBIC ACID) 500 MG tablet Take 250 mg by mouth daily.     Allergies:   Codeine, Other, Penicillin g, and Penicillins   Social  History   Socioeconomic History   Marital status: Married    Spouse name: Not on file   Number of children: 1   Years of education: Not on file   Highest education level: Associate degree: occupational, Hotel manager, or vocational program  Occupational History   Occupation: Clinical biochemist  Tobacco Use   Smoking status: Never   Smokeless tobacco: Current    Types: Snuff   Tobacco comments:    1/2 can Snuff daily 04/05/2021  Vaping Use   Vaping Use: Never used  Substance and Sexual Activity   Alcohol use: Not  Currently    Alcohol/week: 1.0 standard drink    Types: 1 Cans of beer per week    Comment: 1 beer last weekend 04/05/2021   Drug use: No   Sexual activity: Not on file  Other Topics Concern   Not on file  Social History Narrative   Not on file   Social Determinants of Health   Financial Resource Strain: High Risk   Difficulty of Paying Living Expenses: Hard  Food Insecurity: No Food Insecurity   Worried About Running Out of Food in the Last Year: Never true   [place] in the Last Year: Never true  Transportation Needs: No Transportation Needs   Lack of Transportation (Medical): No   Lack of Transportation (Non-Medical): No  Physical Activity: Not on file  Stress: Not on file  Social Connections: Not on file     Family History: The patient's family history includes Dementia in his mother; Healthy in his sister and son; Heart attack in his father and mother.  ROS:   Please see the history of present illness.    (+) Cough (+) Fatigue All other systems reviewed and are negative.  EKGs/Labs/Other Studies Reviewed:    The following studies were reviewed today:  12/17/2020 Atrial Fibrillation Ablation: CONCLUSIONS: 1. Successful PVI 2. Successful ablation/isolation of the posterior wall 3. Successful ablation of the tricuspid isthmus 4. Preprocedural TEE demonstrated no LAA thrombus. 5. Intracardiac echo reveals normal LV function, 4 PV, trivial pericardial effusion 6. No early apparent complications. 7. Protonix 40mg  PO daily for 45 days 8. Colchicine 0.6mg  PO BID for 5 days  12/17/2020 Echo TEE:  1. Pre Ablation TEE. No LAA thrombus present.   2. Left ventricular ejection fraction, by estimation, is 60 to 65%. The  left ventricle has normal function. The left ventricle has no regional  wall motion abnormalities.   3. Right ventricular systolic function is normal. The right ventricular  size is normal.   4. No left atrial/left atrial appendage thrombus was  detected.   5. The mitral valve is grossly normal. Trivial mitral valve  regurgitation. No evidence of mitral stenosis.   6. The aortic valve is tricuspid. Aortic valve regurgitation is not  visualized. No aortic stenosis is present.   11/02/2020 Cardiac CTA: There is misregistration artifact (affects a small portion of circumflex read).   Aorta:  Normal size.  No calcifications.  No dissection.   Aortic Valve: No calcifications.   Coronary Arteries:  Normal coronary origin.  Right dominance.   RCA is a large dominant artery that gives rise to PDA and PLA. There is no plaque.   Left main is a large artery that gives rise to LAD and LCX arteries.   LAD is a large vessel that has no plaque.   LCX is a non-dominant artery that gives rise to two large OM branches. There is no plaque. (A portion of  the proximal to mid vessel is not interpretable due to misregistration artifact).   Other findings:   Normal pulmonary vein drainage into the left atrium.   RUPV: 19.2 x 15.8 mm, area 2.2 cm2   RLPV: 23.7 x 21.0 mm, area 3.82 cm2   LUPV: 20.2 x 15.6 mm, area 2.46 cm2   LLPV: 20.4 x 16.8 mm, area 2.71 cm2   Can not exclude left atrial appendage thrombus. There are no delayed acquisitions to confirm absence of clot.   Normal size of the pulmonary artery.   Please see radiology report for non cardiac findings.   IMPRESSION: 1. Coronary calcium score of 0. This was 0 percentile for age and sex matched control.   2. Normal coronary origin with right dominance.   3. No evidence of CAD. (A small portion of the proximal to mid circumflex is not interpretable due to misregistration artifact).   4. Can not exclude left atrial appendage thrombus. There are no delayed acquisitions to confirm absence of clot. Consider repeat dedicated pulmonary vein protocol study or TEE.  11/02/2020 Bilateral LE Venous Doppler: Summary:  BILATERAL:  - No evidence of deep vein thrombosis seen in the  lower extremities,  bilaterally.  -No evidence of popliteal cyst, bilaterally.   EKG:   Sinus rhythm QTc is 450 ms  Recent Labs: 11/01/2020: B Natriuretic Peptide 438.2; TSH 0.809 12/28/2020: ALT 15; Hemoglobin 15.4; Platelets 355 04/15/2021: BUN 10; Creatinine, Ser 0.77; Magnesium 2.0; Potassium 4.1; Sodium 140  Recent Lipid Panel    Component Value Date/Time   CHOL 137 10/05/2020 0844   TRIG 34 10/05/2020 0844   HDL 54 10/05/2020 0844   CHOLHDL 2.5 10/05/2020 0844   VLDL 7 10/05/2020 0844   LDLCALC 76 10/05/2020 0844    Physical Exam:    VS:  BP 126/80    Pulse 88    Ht 6\' 5"  (1.956 m)    Wt 212 lb (96.2 kg)    SpO2 96%    BMI 25.14 kg/m     Wt Readings from Last 3 Encounters:  05/10/21 212 lb (96.2 kg)  05/04/21 215 lb (97.5 kg)  04/15/21 214 lb (97.1 kg)     GEN:  Well nourished, well developed in no acute distress HEENT: Normal NECK: No JVD; No carotid bruits LYMPHATICS: No lymphadenopathy CARDIAC: RRR, no murmurs, rubs, gallops RESPIRATORY:  Clear to auscultation without rales, wheezing or rhonchi  ABDOMEN: Soft, non-tender, non-distended MUSCULOSKELETAL:  No edema; No deformity  SKIN: Warm and dry NEUROLOGIC:  Alert and oriented x 3 PSYCHIATRIC:  Normal affect        ASSESSMENT:    1. Persistent atrial fibrillation ([place])   2. Atypical atrial flutter (HCC)   3. Essential hypertension   4. Encounter for long-term (current) use of high-risk medication    PLAN:    In order of problems listed above:   #Persistent atrial fibrillation Maintaining sinus rhythm on Tikosyn 250 mcg by mouth twice daily.  On Eliquis 5 mg by mouth twice daily for stroke prophylaxis.  We will plan to see him back in 4 months with an APP.  Get a BMP and a magnesium at that visit.  #Hypertension Measures his blood pressures 3 times a day at home.  In general the lunch and evening time readings are within normal limits.  The morning reading is slightly elevated.  For now, continue  current regimen including Entresto, Toprol.  Continue monitoring his pressures at least 1-2 times per week.  #High  risk med monitoring QTc is stable today.  Get blood work at follow-up appointment in 4 months.   Follow-up in 4 months with APP. BNP and Mag at that visit.    Medication Adjustments/Labs and Tests Ordered: Current medicines are reviewed at length with the patient today.  Concerns regarding medicines are outlined above.   Orders Placed This Encounter  Procedures   EKG 12-Lead   No orders of the defined types were placed in this encounter.  I,Mathew Stumpf,acting as a Education administrator for Vickie Epley, MD.,have documented all relevant documentation on the behalf of Vickie Epley, MD,as directed by  Vickie Epley, MD while in the presence of Vickie Epley, MD.  I, Vickie Epley, MD, have reviewed all documentation for this visit. The documentation on 05/10/21 for the exam, diagnosis, procedures, and orders are all accurate and complete.   Signed, Lars Mage, MD, [facility], [facility] 05/10/2021 4:59 PM    Electrophysiology [facility]

## 2021-05-10 NOTE — Patient Instructions (Signed)
Medication Instructions:  Your physician recommends that you continue on your current medications as directed. Please refer to the Current Medication list given to you today. *If you need a refill on your cardiac medications before your next appointment, please call your pharmacy*  Lab Work: None. If you have labs (blood work) drawn today and your tests are completely normal, you will receive your results only by: [place] (if you have MyChart) OR A paper copy in the mail If you have any lab test that is abnormal or we need to change your treatment, we will call you to review the results.  Testing/Procedures: None.  Follow-Up: At [facility], you and your health needs are our priority.  As part of our continuing mission to provide you with exceptional heart care, we have created designated Provider Care Teams.  These Care Teams include your primary Cardiologist (physician) and Advanced Practice Providers (APPs -  Physician Assistants and Nurse Practitioners) who all work together to provide you with the care you need, when you need it.  Your physician wants you to follow-up in: 4 months with  one of the following Advanced Practice Providers on your designated Care Team:    Peter Mcmillan, Peter Mcmillan "Peter Mcmillan" [place], Peter   You will receive a reminder letter in the mail two months in advance. If you don't receive a letter, please call our office to schedule the follow-up appointment.  We recommend signing up for the patient portal called "MyChart".  Sign up information is provided on this After Visit Summary.  MyChart is used to connect with patients for Virtual Visits (Telemedicine).  Patients are able to view lab/test results, encounter notes, upcoming appointments, etc.  Non-urgent messages can be sent to your provider as well.   To learn more about what you can do with MyChart, go to [email or web address].    Any Other Special Instructions Will Be Listed Below (If  Applicable).

## 2021-06-12 NOTE — Progress Notes (Signed)
Office Visit Note  Patient: Peter Mcmillan             Date of Birth: Jul 24, 1955           MRN: [id number]             PCP: Lois Huxley, PA Referring: Lois Huxley, [place] Visit Date: 06/13/2021   Subjective:  Follow-up (Bil hand pain and swelling)   History of Present Illness: Peter Mcmillan is a 66 y.o. 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 He missed one does of methotrexate due to running out of his medication and now has increased symptoms with bilateral hand pain and swelling. Right hand is worst affected and difficult to use normally. He also has a new cough ongoing for the past month it is not productive. Cough is most common when talking, when sitting and eating, or after he lies down in bed to sleep. He is not yet taking any seasonal allergy medication for the year. He is not taking any cough medicines.  Previous HPI 05/04/21 Peter Mcmillan is a 66 y.o. 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 weekly.  He stopped taking the Orencia injections for about a month ever since he was hospitalized for his A. fib.  He feels there was not much difference when he took the shots and has not noticed much difference since stopping them.  He continues having some joint pain stiffness and swelling mostly affecting his hands and knees.  He is curious about any other anti-inflammatory medicine or supplements to try in addition to his RA medicines.   Previous HPI 09/23/20 Peter Mcmillan is a 66 y.o. male here for follow-up of seropositive rheumatoid arthritis on methotrexate 15 mg p.o. weekly and prednisone 20 mg daily.  He feels his symptoms have improved taking the medications although worsened greatly when he decrease his prednisone down to 10 mg as directed and so went back to taking 20 daily.  He has been out of the methotrexate since  about 2 weeks ago.   Review of Systems  Constitutional:  Negative for fatigue.  HENT:  Negative for mouth dryness.   Eyes:  Negative for dryness.  Respiratory:  Positive for cough and shortness of breath.   Cardiovascular:  Negative for swelling in legs/feet.  Gastrointestinal:  Negative for constipation.  Endocrine: Positive for cold intolerance and increased urination.  Genitourinary:  Negative for difficulty urinating.  Musculoskeletal:  Positive for joint pain, joint pain, joint swelling, muscle weakness, morning stiffness and muscle tenderness.  Skin:  Negative for rash.  Allergic/Immunologic: Negative for susceptible to infections.  Neurological:  Negative for numbness.  Hematological:  Positive for bruising/bleeding tendency.  Psychiatric/Behavioral:  Positive for sleep disturbance.    PMFS History:  Patient Active Problem List   Diagnosis Date Noted   Secondary hypercoagulable state ([place]) 04/05/2021   Atypical atrial flutter ([place]) 04/05/2021   Atrial fibrillation ([place]) 12/17/2020   Rapid atrial fibrillation ([place]) 11/01/2020   Atrial flutter with rapid ventricular response ([place]) 10/05/2020   Seropositive rheumatoid arthritis ([place]) 07/15/2020   High risk medication use 07/15/2020   Allergic rhinitis due to pollen 06/11/2020   ED (erectile dysfunction) of organic origin 06/11/2020   Essential hypertension 06/11/2020   Gastro-esophageal reflux disease without esophagitis 06/11/2020   Hyperlipidemia 06/11/2020   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[place]) 06/11/2020  Prediabetes 06/11/2020   Tobacco user 06/11/2020   Vitamin D deficiency 06/11/2020   Bilateral hand pain 06/11/2020   Bilateral shoulder pain 06/11/2020   Atrial flutter ([place]) 12/09/2013   PULMONARY NODULE 08/18/2008   COUGH 08/18/2008    Past Medical History:  Diagnosis Date   Atrial flutter (HCC)    Erectile dysfunction    Rheumatoid arthritis ([place])    Tobacco abuse    Smokeless tobacco    Family History   Problem Relation Age of Onset   Heart attack Father    Heart attack Mother    Dementia Mother    Healthy Sister    Healthy Son    Past Surgical History:  Procedure Laterality Date   ATRIAL FIBRILLATION ABLATION N/A 12/17/2020   Procedure: ATRIAL FIBRILLATION ABLATION;  Surgeon: Vickie Epley, MD;  Location: [facility];  Service: Cardiovascular;  Laterality: N/A;   [place] N/A 10/06/2020   Procedure: CARDIOVERSION;  Surgeon: Donato Heinz, MD;  Location: [facility];  Service: Cardiovascular;  Laterality: N/A;   MELANOMA EXCISION  2020   TEE WITHOUT CARDIOVERSION N/A 10/06/2020   Procedure: TRANSESOPHAGEAL ECHOCARDIOGRAM (TEE);  Surgeon: Donato Heinz, MD;  Location: [facility];  Service: Cardiovascular;  Laterality: N/A;   TEE WITHOUT CARDIOVERSION N/A 12/17/2020   Procedure: TRANSESOPHAGEAL ECHOCARDIOGRAM (TEE);  Surgeon: Geralynn Rile, MD;  Location: [facility];  Service: Cardiovascular;  Laterality: N/A;   Social History   Social History Narrative   Not on file   Immunization History  Administered Date(s) Administered   Hep A / Hep B 02/17/2011   Influenza Split 01/29/2017   Moderna Sars-Covid-2 Vaccination 07/09/2019, 07/09/2019, 08/06/2019, 08/06/2019   Pneumococcal Polysaccharide-23 02/12/2009   Tdap 02/12/2009, 06/13/2017     Objective: Vital Signs: BP 137/75 (BP Location: Left Arm, Patient Position: Sitting, Cuff Size: Normal)    Pulse 96    Resp 15    Ht '6\' 5"'$  (1.956 m)    Wt 215 lb (97.5 kg)    BMI 25.50 kg/m    Physical Exam HENT:     Mouth/Throat:     Mouth: Mucous membranes are moist.     Pharynx: Oropharynx is clear.  Eyes:     Comments: Mild conjunctival injection  Cardiovascular:     Rate and Rhythm: Normal rate and regular rhythm.  Pulmonary:     Effort: Pulmonary effort is normal.     Breath sounds: Normal breath sounds.     Comments: Dry cough, repeatedly coughing with deep  inspiration or with pressure on throat for exam Musculoskeletal:     Right lower leg: No edema.     Left lower leg: No edema.  Skin:    General: Skin is warm and dry.  Neurological:     Mental Status: He is alert.  Psychiatric:        Mood and Affect: Mood normal.     Musculoskeletal Exam:  Shoulders full ROM no tenderness or swelling Elbows full ROM no tenderness or swelling Wrists bilateral tenderness to pressure and with ROM no palpable swelling Right 2nd-3rd MCPs swollen and painful, lateral deviation and unable to fully extent, left hand 2nd-3rd MCPs tender with decreased grip tightness but no palpable swelling Knees full ROM no tenderness or swelling   CDAI Exam: CDAI Score: 17  Patient Global: 40 mm; Provider Global: 50 mm Swollen: 2 ; Tender: 6  Joint Exam 06/13/2021      Right  Left  Wrist   Tender   Tender  MCP 2  Swollen Tender   Tender  MCP 3  Swollen Tender   Tender     Investigation: No additional findings.  Imaging: No results found.  Recent Labs: Lab Results  Component Value Date   WBC 9.1 12/28/2020   HGB 15.4 12/28/2020   PLT 355 12/28/2020   NA 140 04/15/2021   K 4.1 04/15/2021   CL 107 04/15/2021   CO2 23 04/15/2021   GLUCOSE 105 (H) 04/15/2021   BUN 10 04/15/2021   CREATININE 0.77 04/15/2021   BILITOT 0.4 12/28/2020   ALKPHOS 44 12/28/2020   AST 12 (L) 12/28/2020   ALT 15 12/28/2020   PROT 6.6 12/28/2020   ALBUMIN 3.8 12/28/2020   CALCIUM 9.0 04/15/2021   GFRAA 111 09/23/2020   QFTBGOLDPLUS NEGATIVE 10/20/2020    Speciality Comments: No specialty comments available.  Procedures:  No procedures performed Allergies: Codeine, Other, Penicillin g, and Penicillins   Assessment / Plan:     Visit Diagnoses: Seropositive rheumatoid arthritis ([place]) - Plan: methotrexate 2.5 MG tablet  Symptoms worse today compared to our last visit deftly think we need to increase his treatment for active seropositive RA may be developing some early  chronic changes with right hand not completely reducible.  Increase methotrexate to 25 mg p.o. weekly split dose continue folic acid 1 mg daily.  We will need to repeat labs within 1 month after dose increase if tolerating clinical follow-up in 3 months.  High risk medication use - Plan: CBC with Differential/Platelet, COMPLETE METABOLIC PANEL WITH GFR  Future orders for CBC and CMP placed with plan to increase methotrexate dose.  He has been tolerating 15 mg without incident.  Acute cough  Coughing for the past 1 month lungs are completely clear on exam suspect this is more upper airway.  He has some conjunctivitis does have history of perennial allergies so I suspect this is the main contributor.  Could have some ongoing bronchitis seems less likely.  Discussed trying some over-the-counter cough medicine and starting use of antihistamines which she has typically done during the spring season.  Orders: Orders Placed This Encounter  Procedures   CBC with Differential/Platelet   COMPLETE METABOLIC PANEL WITH GFR   Meds ordered this encounter  Medications   methotrexate 2.5 MG tablet    Sig: Take 10 tablets (25 mg total) by mouth once a week.    Dispense:  40 tablet    Refill:  2    PLEASE SEND RX FOR 28 DAY SUPPLY FOR INS PURPOSES THANK YOU.     Follow-Up Instructions: Return in about 3 months (around 09/13/2021) for RA on MTX increased f/u 34mo.   CCollier Salina MD  Note - This record has been created using [employer]  Chart creation errors have been sought, but may not always  have been located. Such creation errors do not reflect on  the standard of medical care.

## 2021-06-13 ENCOUNTER — Other Ambulatory Visit: Payer: Self-pay

## 2021-06-13 ENCOUNTER — Ambulatory Visit: Payer: Commercial Managed Care - PPO | Admitting: Internal Medicine

## 2021-06-13 ENCOUNTER — Encounter: Payer: Self-pay | Admitting: Internal Medicine

## 2021-06-13 ENCOUNTER — Other Ambulatory Visit: Payer: Commercial Managed Care - PPO

## 2021-06-13 ENCOUNTER — Ambulatory Visit (INDEPENDENT_AMBULATORY_CARE_PROVIDER_SITE_OTHER): Payer: Commercial Managed Care - PPO | Admitting: Internal Medicine

## 2021-06-13 VITALS — BP 137/75 | HR 96 | Resp 15 | Ht 77.0 in | Wt 215.0 lb

## 2021-06-13 DIAGNOSIS — Z79899 Other long term (current) drug therapy: Secondary | ICD-10-CM

## 2021-06-13 DIAGNOSIS — M059 Rheumatoid arthritis with rheumatoid factor, unspecified: Secondary | ICD-10-CM | POA: Diagnosis not present

## 2021-06-13 DIAGNOSIS — R051 Acute cough: Secondary | ICD-10-CM | POA: Diagnosis not present

## 2021-06-13 DIAGNOSIS — E559 Vitamin D deficiency, unspecified: Secondary | ICD-10-CM

## 2021-06-13 MED ORDER — METHOTREXATE SODIUM 2.5 MG PO TABS: 25.0000 mg | ORAL_TABLET | ORAL | 2 refills | Status: DC

## 2021-06-14 LAB — VITAMIN D 25 HYDROXY (VIT D DEFICIENCY, FRACTURES): Vit D, 25-Hydroxy: 42.9 ng/mL (ref 30.0–100.0)

## 2021-06-17 ENCOUNTER — Telehealth: Payer: Self-pay

## 2021-06-17 MED ORDER — VITAMIN D 50 MCG (2000 UT) PO TABS: 4000.0000 [IU] | ORAL_TABLET | Freq: Every day | ORAL | 3 refills | Status: DC

## 2021-06-17 NOTE — Telephone Encounter (Signed)
-----   Message from Fay Records, MD sent at 06/14/2021 11:31 PM EDT ----- ?Vit D still a little low   I would take 4000 U per day   ?

## 2021-06-17 NOTE — Telephone Encounter (Signed)
The patient has been notified of the result and verbalized understanding.  All questions (if any) were answered.     

## 2021-06-20 ENCOUNTER — Telehealth: Payer: Self-pay

## 2021-06-20 DIAGNOSIS — M059 Rheumatoid arthritis with rheumatoid factor, unspecified: Secondary | ICD-10-CM

## 2021-06-20 NOTE — Telephone Encounter (Signed)
Patient called stating he has increased pain in his hands, shoulders, and knees.  The pain started over the weekend and he is having difficulty walking.  Patient states he just had an appointment last week with Dr. Benjamine Mola who told him to call the office if his pain increased for a prescription of pain medication. ?

## 2021-06-21 ENCOUNTER — Telehealth: Payer: Self-pay | Admitting: Internal Medicine

## 2021-06-21 MED ORDER — PREDNISONE 10 MG PO TABS: ORAL_TABLET | ORAL | 0 refills | Status: DC

## 2021-06-21 MED ORDER — DICLOFENAC SODIUM 1 % EX GEL: 2.0000 g | Freq: Four times a day (QID) | CUTANEOUS | 0 refills | Status: DC | PRN

## 2021-06-21 NOTE — Telephone Encounter (Signed)
Jenny Reichmann, patients wife, called the office stating Swayze cant lift his arms, his hands and knees are swollen and painful and he is unable to get relief with what they can do at home. Jenny Reichmann states he has tried [employer], OTC arthritis creams and lidocaine patches.Jenny Reichmann wishes for him to be seen and since Dr. Benjamine Mola is out of office he cannot. Cindy requests a call back with his options because he is in so much pain. Jenny Reichmann states he cannot take prednisone because of his AFIB. Please advise. She requests an urgent response. ?

## 2021-06-21 NOTE — Addendum Note (Signed)
Addended by: Collier Salina on: 06/21/2021 07:56 PM ? ? Modules accepted: Orders ? ?

## 2021-06-21 NOTE — Telephone Encounter (Signed)
Sending Rx for prednisone 1 week 20 mg daily 1 week 10 mg daily. He had significant inflammation from RA and increasing methotrexate will take some time to work.

## 2021-06-21 NOTE — Telephone Encounter (Signed)
I spoke with Peter Mcmillan about his increased joint pain affecting a few areas including shoulders and hands. He is recommended against taking prednisone. He does not want to take any narcotic pain medication concerned this will impair his ability to work. Recommended he can try topical voltaren for joint swelling. Very small amount of systemic exposure should  not be a major risk for his cardiac medications. ?

## 2021-06-21 NOTE — Telephone Encounter (Signed)
Spoke with Jenny Reichmann and advised if patient is in that much pain seek evaluation at urgent care or the emergency. Cindy expressed understanding.  ?

## 2021-06-21 NOTE — Addendum Note (Signed)
Addended by: Collier Salina on: 06/21/2021 11:31 AM ? ? Modules accepted: Orders ? ?

## 2021-06-23 ENCOUNTER — Telehealth: Payer: Self-pay | Admitting: Cardiology

## 2021-06-23 NOTE — Telephone Encounter (Signed)
New Message: ? ? ? ?Patient have Arthritidis ? and his doctor prescribed Prednisone. She wants to know if this is alright for pt to take with his condition and his other medicine? If she does not answer, please just leave the message on her voicemail. ?

## 2021-06-23 NOTE — Telephone Encounter (Signed)
Prednisone should be OK with his dofetilide.  ?Thx,  ?CL   ? ?Peter Mcmillan is aware per Dr Quentin Ore OK to take prednisone.  ?

## 2021-07-06 ENCOUNTER — Other Ambulatory Visit: Payer: Self-pay | Admitting: Physician Assistant

## 2021-07-06 ENCOUNTER — Other Ambulatory Visit: Payer: Self-pay | Admitting: Internal Medicine

## 2021-07-06 DIAGNOSIS — M059 Rheumatoid arthritis with rheumatoid factor, unspecified: Secondary | ICD-10-CM

## 2021-07-06 NOTE — Telephone Encounter (Signed)
Next Visit: 09/16/2021 ? ?Last Visit: 06/13/2021 ? ?Last Fill: 04/06/2021 ? ?Dx: Seropositive rheumatoid arthritis  ? ?Current Dose per office note on 05/13/4707: folic acid 1 mg daily ? ?Okay to refill folic acid?  ? ?

## 2021-07-29 ENCOUNTER — Other Ambulatory Visit: Payer: Self-pay | Admitting: Internal Medicine

## 2021-07-29 DIAGNOSIS — M059 Rheumatoid arthritis with rheumatoid factor, unspecified: Secondary | ICD-10-CM

## 2021-07-29 NOTE — Telephone Encounter (Signed)
Next Visit: 09/16/2021 ?  ?Last Visit: 06/13/2021 ?  ?Last Fill: 06/21/2021 ? ?Dx: Seropositive rheumatoid arthritis  ?  ?Current Dose per office note on 06/13/2021: not discussed.  ? ?Okay to refill Voltaren Gel?  ?

## 2021-08-19 ENCOUNTER — Ambulatory Visit (HOSPITAL_COMMUNITY)
Admission: RE | Admit: 2021-08-19 | Discharge: 2021-08-19 | Disposition: A | Discharge status: Home / Self Care | Payer: Commercial Managed Care - PPO | Source: Ambulatory Visit | Attending: Physician Assistant | Admitting: Physician Assistant

## 2021-08-19 ENCOUNTER — Encounter (HOSPITAL_COMMUNITY): Payer: Self-pay | Admitting: Physician Assistant

## 2021-08-19 VITALS — BP 112/82 | HR 127 | Ht 77.0 in | Wt 213.2 lb

## 2021-08-19 DIAGNOSIS — N529 Male erectile dysfunction, unspecified: Secondary | ICD-10-CM | POA: Insufficient documentation

## 2021-08-19 DIAGNOSIS — Z7901 Long term (current) use of anticoagulants: Secondary | ICD-10-CM | POA: Diagnosis not present

## 2021-08-19 DIAGNOSIS — I4819 Other persistent atrial fibrillation: Secondary | ICD-10-CM

## 2021-08-19 DIAGNOSIS — I4892 Unspecified atrial flutter: Secondary | ICD-10-CM | POA: Insufficient documentation

## 2021-08-19 NOTE — Progress Notes (Signed)
Primary Care Physician: Lois Huxley, PA Primary Cardiologist: Dr Johney Frame Primary Electrophysiologist: Dr Quentin Ore  Referring Physician: Dr Dwana Melena Peter Mcmillan is a 66 y.o. male with a history of atrial flutter, atrial fibrillation, systolic dysfunction, RA who presents for follow up in the [facility]. Patient presented to the hospital 10/05/20 with afib RVR and was started on Eliquis for a CHADS2VASC score of 1. He underwent TEE guided DCCV on 10/06/20. He was hospitalized again 11/01/20 for progressive dyspnea and lower extremity edema. He was cardioverted in the ED then admitted for observation and diuresis. He lost almost 30 lbs of fluid. Patient presented to the ER  8/14 with atrial flutter with RVR. He was successfully cardioverted.   Patient underwent afib and flutter ablation with Dr Quentin Ore on 12/17/20. Unfortunately, he had recurrence of rapid afib and had another DCCV in the ED on 12/28/20. His metoprolol was also increased. Patient is s/p dofetilide loading 1/3-04/08/21. He converted with the medication and did not require DCCV.   On follow up today, patient reports that he feels "great". He is in rapid atrial flutter today. There were no triggers that he could identify.   Today, he denies symptoms of palpitations, chest pain, shortness of breath, orthopnea, PND, lower extremity edema, dizziness, presyncope, syncope, snoring, daytime somnolence, bleeding, or neurologic sequela. The patient is tolerating medications without difficulties and is otherwise without complaint today.    Atrial Fibrillation Risk Factors:  he does not have symptoms or diagnosis of sleep apnea. he does not have a history of rheumatic fever. he does not have a history of alcohol use. The patient does have a history of early familial atrial fibrillation or other arrhythmias. Mother has afib.  he has a BMI of Body mass index is 25.28 kg/m.Marland Kitchen Filed Weights   08/19/21 1019   Weight: 96.7 kg     Family History  Problem Relation Age of Onset   Heart attack Father    Heart attack Mother    Dementia Mother    Healthy Sister    Healthy Son      Atrial Fibrillation Management history:  Previous antiarrhythmic drugs: dofetilide  Previous cardioversions: 10/06/20, 11/01/20, 11/14/20, 12/28/20 Previous ablations: 12/17/20 CHADS2VASC score: 1 Anticoagulation history: Eliquis   Past Medical History:  Diagnosis Date   Atrial flutter ([place])    Erectile dysfunction    Rheumatoid arthritis ([place])    Tobacco abuse    Smokeless tobacco   Past Surgical History:  Procedure Laterality Date   ATRIAL FIBRILLATION ABLATION N/A 12/17/2020   Procedure: ATRIAL FIBRILLATION ABLATION;  Surgeon: Vickie Epley, MD;  Location: [facility];  Service: Cardiovascular;  Laterality: N/A;   [place] N/A 10/06/2020   Procedure: CARDIOVERSION;  Surgeon: Donato Heinz, MD;  Location: [facility];  Service: Cardiovascular;  Laterality: N/A;   MELANOMA EXCISION  2020   TEE WITHOUT CARDIOVERSION N/A 10/06/2020   Procedure: TRANSESOPHAGEAL ECHOCARDIOGRAM (TEE);  Surgeon: Donato Heinz, MD;  Location: [facility];  Service: Cardiovascular;  Laterality: N/A;   TEE WITHOUT CARDIOVERSION N/A 12/17/2020   Procedure: TRANSESOPHAGEAL ECHOCARDIOGRAM (TEE);  Surgeon: Geralynn Rile, MD;  Location: [facility];  Service: Cardiovascular;  Laterality: N/A;    Current Outpatient Medications  Medication Sig Dispense Refill   acetaminophen (TYLENOL) 500 MG tablet Take 500 mg by mouth every 6 (six) hours as needed (Arthritis).     apixaban (ELIQUIS) 5 MG  TABS tablet Take 1 tablet (5 mg total) by mouth 2 (two) times daily. 60 tablet 11   Cholecalciferol (VITAMIN D) 50 MCG (2000 UT) tablet Take 2 tablets (4,000 Units total) by mouth daily. 180 tablet 3   diclofenac Sodium (VOLTAREN) 1 % GEL APPLY 2 GRAMS TOPICALLY 4 TIMES DAILY ASNEEDED  100 g 0   dofetilide (TIKOSYN) 250 MCG capsule Take 1 capsule (250 mcg total) by mouth 2 (two) times daily. 60 capsule 6   Elderberry 500 MG CAPS Take 1 tablet by mouth daily.     ENTRESTO 24-26 MG TAKE 1 TABLET BY MOUTH TWICE (2) DAILY 884 tablet 3   folic acid (FOLVITE) 1 MG tablet TAKE 1 TABLET BY MOUTH ONCE A DAY 90 tablet 0   loratadine (CLARITIN) 10 MG tablet Take 10 mg by mouth daily as needed for allergies.     methotrexate 2.5 MG tablet Take 10 tablets (25 mg total) by mouth once a week. 40 tablet 2   sildenafil (REVATIO) 20 MG tablet TAKE 2 TO 5 TABLETS BY MOUTH 30 MINUTES PRIOR TO SEXUAL ACTIVITY     vitamin C (ASCORBIC ACID) 500 MG tablet Take 250 mg by mouth daily.     metoprolol succinate (TOPROL-XL) 50 MG 24 hr tablet Take 1 tablet (50 mg total) by mouth daily. Take with or immediately following a meal. 90 tablet 3   No current facility-administered medications for this encounter.    Allergies  Allergen Reactions   Codeine Other (See Comments)    agitation   Other Hives, Swelling and Other (See Comments)    make feel weird; hives/swelling   Penicillin G Other (See Comments)   Penicillins Itching, Swelling and Rash    Reaction: 5 years ago    Social History   Socioeconomic History   Marital status: Married    Spouse name: Not on file   Number of children: 1   Years of education: Not on file   Highest education level: Associate degree: occupational, Hotel manager, or vocational program  Occupational History   Occupation: Clinical biochemist  Tobacco Use   Smoking status: Never   Smokeless tobacco: Current    Types: Snuff   Tobacco comments:    1/4 can Snuff daily 04/05/2021  Vaping Use   Vaping Use: Never used  Substance and Sexual Activity   Alcohol use: Yes    Comment: 1 beer every 2 weeks   Drug use: No   Sexual activity: Not on file  Other Topics Concern   Not on file  Social History Narrative   Not on file   Social Determinants of Health   Financial  Resource Strain: High Risk   Difficulty of Paying Living Expenses: Hard  Food Insecurity: No Food Insecurity   Worried About Running Out of Food in the Last Year: Never true   Ran Out of Food in the Last Year: Never true  Transportation Needs: No Transportation Needs   Lack of Transportation (Medical): No   Lack of Transportation (Non-Medical): No  Physical Activity: Not on file  Stress: Not on file  Social Connections: Not on file  Intimate Partner Violence: Not on file     ROS- All systems are reviewed and negative except as per the HPI above.  Physical Exam: Vitals:   08/19/21 1019  BP: 112/82  Pulse: (!) 127  Weight: 96.7 kg  Height: '6\' 5"'$  (1.956 m)     GEN- The patient is a well appearing male, alert and oriented x 3  today.   HEENT-head normocephalic, atraumatic, sclera clear, conjunctiva pink, hearing intact, trachea midline. Lungs- Clear to ausculation bilaterally, normal work of breathing Heart- irregular rate and rhythm, no murmurs, rubs or gallops  GI- soft, NT, ND, + BS Extremities- no clubbing, cyanosis, or edema MS- no significant deformity or atrophy Skin- no rash or lesion Psych- euthymic mood, full affect Neuro- strength and sensation are intact   Wt Readings from Last 3 Encounters:  08/19/21 96.7 kg  06/13/21 97.5 kg  05/10/21 96.2 kg    EKG today demonstrates  Atrial flutter with variable block Vent. rate 127 BPM PR interval * ms QRS duration 80 ms QT/QTcB 324/470 ms  TEE 10/06/20 demonstrated  1. Left ventricular ejection fraction, by estimation, is 40 to 45%. The  left ventricle has mildly decreased function. The left ventricle  demonstrates global hypokinesis.   2. Right ventricular systolic function is normal. The right ventricular  size is mildly enlarged.   3. Left atrial size was mildly dilated. No left atrial/left atrial  appendage thrombus was detected.   4. The mitral valve is normal in structure. Mild mitral valve  regurgitation.    5. The aortic valve is tricuspid. Aortic valve regurgitation is not  visualized.   Conclusion(s)/Recommendation(s): No LA/LAA thrombus identified. Successful cardioversion performed with restoration of normal sinus rhythm.   Epic records are reviewed at length today  CHA2DS2-VASc Score = 2  The patient's score is based upon: CHF History: 1 HTN History: 0 Diabetes History: 0 Stroke History: 0 Vascular Disease History: 0 Age Score: 1 Gender Score: 0      ASSESSMENT AND PLAN: 1. Persistent Atrial Fibrillation/atrial flutter The patient's CHA2DS2-VASc score is 2, indicating a 2.2% annual risk of stroke.   S/p afib and flutter ablation with Dr Quentin Ore 12/17/20  S/p dofetilide loading 1/3-04/08/21 Patient in atrial flutter today with elevated rates, asymptomatic, unclear duration. He states he checked his HR this AM and it was in the low 90s.  Increase Toprol to 50 mg BID. Will bring him back next week for ECG to see if he is persistent. We also discussed Kardia mobile for home monitoring.  Continue dofetilide 250 mcg BID Continue Eliquis 5 mg BID  2. H/o systolic dysfunction  EF 25-95% on pre ablation TEE Appears euvolemic today.   Follow up in the AF clinic next week.     [facility] [address] [place], [place] [address] [phone]

## 2021-08-19 NOTE — Patient Instructions (Addendum)
Kardia   Increase metoprolol '50mg'$  twice a day

## 2021-08-26 ENCOUNTER — Ambulatory Visit (HOSPITAL_COMMUNITY)
Admission: RE | Admit: 2021-08-26 | Discharge: 2021-08-26 | Disposition: A | Discharge status: Home / Self Care | Payer: Commercial Managed Care - PPO | Source: Ambulatory Visit | Attending: Physician Assistant | Admitting: Physician Assistant

## 2021-08-26 DIAGNOSIS — Z79899 Other long term (current) drug therapy: Secondary | ICD-10-CM | POA: Diagnosis not present

## 2021-08-26 DIAGNOSIS — I484 Atypical atrial flutter: Secondary | ICD-10-CM | POA: Diagnosis not present

## 2021-08-26 DIAGNOSIS — D6869 Other thrombophilia: Secondary | ICD-10-CM

## 2021-08-26 DIAGNOSIS — I480 Paroxysmal atrial fibrillation: Secondary | ICD-10-CM | POA: Insufficient documentation

## 2021-08-26 DIAGNOSIS — I4892 Unspecified atrial flutter: Secondary | ICD-10-CM | POA: Diagnosis present

## 2021-08-26 LAB — BASIC METABOLIC PANEL
Anion gap: 9 (ref 5–15)
BUN: 9 mg/dL (ref 8–23)
CO2: 21 mmol/L — ABNORMAL LOW (ref 22–32)
Calcium: 8.9 mg/dL (ref 8.9–10.3)
Chloride: 108 mmol/L (ref 98–111)
Creatinine, Ser: 0.83 mg/dL (ref 0.61–1.24)
GFR, Estimated: >60 mL/min (ref >60)
Glucose, Bld: 107 mg/dL — ABNORMAL HIGH (ref 70–99)
Potassium: 3.7 mmol/L (ref 3.5–5.1)
Sodium: 138 mmol/L (ref 135–145)

## 2021-08-26 LAB — MAGNESIUM: Magnesium: 2 mg/dL (ref 1.7–2.4)

## 2021-08-26 MED ORDER — METOPROLOL SUCCINATE ER 50 MG PO TB24: 50.0000 mg | ORAL_TABLET | Freq: Two times a day (BID) | ORAL | 2 refills | Status: DC

## 2021-08-26 MED ORDER — POTASSIUM CHLORIDE CRYS ER 10 MEQ PO TBCR: 10.0000 meq | EXTENDED_RELEASE_TABLET | Freq: Every day | ORAL | 1 refills | Status: DC

## 2021-08-26 NOTE — Progress Notes (Signed)
Patient returns for ECG after increasing metoprolol. ECG shows SR HR 70, PR 138, QRS 84, QTc 429. Will not pursue DCCV. He will continue on higher dose of BB. We discussed smart device technology to monitor for reoccurrence. We also discussed the possibility of repeat ablation if his afib and atrial flutter returns, he is agreeable to ablation if needed. F/u in the AF clinic in 3 months.

## 2021-08-26 NOTE — Addendum Note (Signed)
Encounter addended by: Hinda Kehr, CMA on: 08/26/2021 1:47 PM  Actions taken: Pharmacy for encounter modified, Order list changed

## 2021-08-26 NOTE — Patient Instructions (Signed)
Ecg capabilities on watches

## 2021-09-02 ENCOUNTER — Other Ambulatory Visit (HOSPITAL_BASED_OUTPATIENT_CLINIC_OR_DEPARTMENT_OTHER): Payer: Self-pay

## 2021-09-02 NOTE — Progress Notes (Signed)
Office Visit Note  Patient: Peter Mcmillan             Date of Birth: 02/07/1956           MRN: [id number]             PCP: Lois Huxley, PA Referring: Lois Huxley, [place] Visit Date: 09/16/2021   Subjective:  Pain and Edema of the Right Elbow (Bursitis and requesting elbow ti be drained. )   History of Present Illness: Peter Mcmillan is a 66 y.o. 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  Hand pain is pretty stable without any major flareup or exacerbation.  His current complaint is due to right elbow swelling.  He developed painful bursitis this was evaluated at urgent care clinic last month and again about a week ago.  Not particularly hot or inflamed but is painful and just remains swollen.  He does not recall any trauma preceding the onset of the swelling.  Previous HPI 06/13/2021  Peter Mcmillan is a 66 y.o. 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 He missed one does of methotrexate due to running out of his medication and now has increased symptoms with bilateral hand pain and swelling. Right hand is worst affected and difficult to use normally. He also has a new cough ongoing for the past month it is not productive. Cough is most common when talking, when sitting and eating, or after he lies down in bed to sleep. He is not yet taking any seasonal allergy medication for the year. He is not taking any cough medicines.   Previous HPI 05/04/21 Peter Mcmillan is a 66 y.o. 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 weekly.  He stopped taking the Orencia injections for about a month ever since he was hospitalized for his A. fib.  He feels there was not much difference when he took the shots and has not noticed much difference since stopping them.  He continues having some joint pain stiffness  and swelling mostly affecting his hands and knees.  He is curious about any other anti-inflammatory medicine or supplements to try in addition to his RA medicines.   Previous HPI 09/23/20 Peter Mcmillan is a 66 y.o. male here for follow-up of seropositive rheumatoid arthritis on methotrexate 15 mg p.o. weekly and prednisone 20 mg daily.  He feels his symptoms have improved taking the medications although worsened greatly when he decrease his prednisone down to 10 mg as directed and so went back to taking 20 daily.  He has been out of the methotrexate since about 2 weeks ago.   Review of Systems  Constitutional:  Negative for fatigue.  HENT:  Negative for mouth dryness.   Eyes:  Negative for dryness.  Respiratory:  Positive for shortness of breath.   Cardiovascular:  Negative for swelling in legs/feet.  Gastrointestinal:  Positive for constipation and diarrhea.  Endocrine: Positive for cold intolerance and increased urination.  Genitourinary:  Negative for difficulty urinating.  Musculoskeletal:  Positive for joint pain, gait problem, joint pain, joint swelling and morning stiffness.  Skin:  Positive for nodules/bumps.  Allergic/Immunologic: Negative for susceptible to infections.  Neurological:  Positive for weakness.  Hematological:  Positive for bruising/bleeding tendency.  Psychiatric/Behavioral:  Positive for sleep disturbance.     PMFS History:  Patient Active Problem List   Diagnosis Date Noted   Secondary hypercoagulable state ([place]) 04/05/2021   Atypical atrial flutter ([place]) 04/05/2021   Atrial fibrillation ([place]) 12/17/2020   Rapid atrial fibrillation ([place]) 11/01/2020   Atrial flutter with rapid ventricular response ([place]) 10/05/2020   Seropositive rheumatoid arthritis ([place]) 07/15/2020   High risk medication use 07/15/2020   Allergic rhinitis due to pollen 06/11/2020   ED (erectile dysfunction) of organic origin 06/11/2020   Essential hypertension 06/11/2020    Gastro-esophageal reflux disease without esophagitis 06/11/2020   Hyperlipidemia 06/11/2020   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/11/2020   Prediabetes 06/11/2020   Tobacco user 06/11/2020   Vitamin D deficiency 06/11/2020   Bilateral hand pain 06/11/2020   Bilateral shoulder pain 06/11/2020   Atrial flutter ([place]) 12/09/2013   PULMONARY NODULE 08/18/2008   COUGH 08/18/2008    Past Medical History:  Diagnosis Date   Atrial flutter ([place])    Bursitis of right elbow    Erectile dysfunction    Rheumatoid arthritis ([place])    Tobacco abuse    Smokeless tobacco    Family History  Problem Relation Age of Onset   Heart attack Father    Heart attack Mother    Dementia Mother    Healthy Sister    Healthy Son    Past Surgical History:  Procedure Laterality Date   ATRIAL FIBRILLATION ABLATION N/A 12/17/2020   Procedure: ATRIAL FIBRILLATION ABLATION;  Surgeon: Vickie Epley, MD;  Location: [facility];  Service: Cardiovascular;  Laterality: N/A;   [place] N/A 10/06/2020   Procedure: CARDIOVERSION;  Surgeon: Donato Heinz, MD;  Location: [facility];  Service: Cardiovascular;  Laterality: N/A;   MELANOMA EXCISION  2020   TEE WITHOUT CARDIOVERSION N/A 10/06/2020   Procedure: TRANSESOPHAGEAL ECHOCARDIOGRAM (TEE);  Surgeon: Donato Heinz, MD;  Location: [facility];  Service: Cardiovascular;  Laterality: N/A;   TEE WITHOUT CARDIOVERSION N/A 12/17/2020   Procedure: TRANSESOPHAGEAL ECHOCARDIOGRAM (TEE);  Surgeon: Geralynn Rile, MD;  Location: [facility];  Service: Cardiovascular;  Laterality: N/A;   Social History   Social History Narrative   Not on file   Immunization History  Administered Date(s) Administered   Hep A / Hep B 02/17/2011   Influenza Split 01/29/2017   Moderna Sars-Covid-2 Vaccination 07/09/2019, 07/09/2019, 08/06/2019, 08/06/2019   Pneumococcal Polysaccharide-23 02/12/2009   Tdap 02/12/2009,  06/13/2017, 06/18/2020     Objective: Vital Signs: BP (!) 142/88 (BP Location: Left Arm, Patient Position: Sitting, Cuff Size: Small)   Pulse 80   Resp 12   Ht '6\' 5"'$  (1.956 m)   Wt 216 lb 3.2 oz (98.1 kg)   BMI 25.64 kg/m    Physical Exam Cardiovascular:     Rate and Rhythm: Normal rate and regular rhythm.  Pulmonary:     Effort: Pulmonary effort is normal.     Breath sounds: Normal breath sounds.  Skin:    General: Skin is warm and dry.     Findings: No rash.  Neurological:     Mental Status: He is alert.  Psychiatric:        Mood and Affect: Mood normal.     Musculoskeletal Exam:  Shoulders full ROM no tenderness or swelling Elbows full ROM, right olecranon bursa with large swelling, minimal tenderness, no erythema or warmth Wrists full ROM no tenderness or swelling b/l Right 2nd-3rd MCPs mildly tender, chronic soft tissue swelling with mild lateral deviation and decreased  extension ROM Knees full ROM no tenderness or swelling  CDAI Exam: CDAI Score: 8  Patient Global: 20 mm; Provider Global: 30 mm Swollen: 1 ; Tender: 2  Joint Exam 09/16/2021      Right  Left  Elbow  Swollen      MCP 2   Tender     MCP 3   Tender        Investigation: No additional findings.  Imaging: No results found.  Recent Labs: Lab Results  Component Value Date   WBC 9.1 12/28/2020   HGB 15.4 12/28/2020   PLT 355 12/28/2020   NA 136 09/13/2021   K 3.9 09/13/2021   CL 104 09/13/2021   CO2 25 09/13/2021   GLUCOSE 148 (H) 09/13/2021   BUN 10 09/13/2021   CREATININE 0.99 09/13/2021   BILITOT 0.4 12/28/2020   ALKPHOS 44 12/28/2020   AST 12 (L) 12/28/2020   ALT 15 12/28/2020   PROT 6.6 12/28/2020   ALBUMIN 3.8 12/28/2020   CALCIUM 9.3 09/13/2021   GFRAA 111 09/23/2020   QFTBGOLDPLUS NEGATIVE 10/20/2020    Speciality Comments: No specialty comments available.  Procedures:  Medium Joint Inj: R olecranon bursa on 09/16/2021 11:50 AM Indications: pain and joint  swelling Details: 22 G 1.5 in needle, posterior approach Aspirate: 17 mL blood-tinged Outcome: tolerated well, no immediate complications Procedure, treatment alternatives, risks and benefits explained, specific risks discussed. Consent was given by the patient. Immediately prior to procedure a time out was called to verify the correct patient, procedure, equipment, support staff and site/side marked as required. Patient was prepped and draped in the usual sterile fashion.     Allergies: Codeine, Other, Penicillin g, and Penicillins   Assessment / Plan:     Visit Diagnoses: Seropositive rheumatoid arthritis ([place]) - Plan: Sedimentation rate  Toyed arthritis appears reasonably controlled in hands or wrist at this time.  I am not sure if the predominant bursitis is due to RA disease activity or could have sustained minor trauma that he does not specifically recall provoking this.  Checking sedimentation rate for overall disease activity monitoring.  He is interested in trial of aspiration of the olecranon bursa we discussed risk the site can be susceptible to infection or can reaccumulate if underlying process remains active.  Aspiration demonstrated significantly blood-tinged fluid definitely no purulence.  Plan to continue the methotrexate 25 mg p.o. weekly and folic acid 1 mg daily.  High risk medication use - methotrexate to 25 mg p.o. weekly split dose continue folic acid 1 mg daily.   - Plan: CBC with Differential/Platelet, COMPLETE METABOLIC PANEL WITH GFR  Checking CBC and CMP today for methotrexate medication monitoring.  Acute cough  Not sure if this is directly related to any of his other complaints.  Not having situational symptoms.  No concerning findings on pulmonary exam and no specific treatment changes at this time.  Orders: Orders Placed This Encounter  Procedures   Medium Joint Inj   Sedimentation rate   CBC with Differential/Platelet   COMPLETE METABOLIC PANEL WITH GFR    No orders of the defined types were placed in this encounter.    Follow-Up Instructions: Return in about 3 months (around 12/17/2021) for RA on MTX f/u 36mo.   CCollier Salina MD  Note - This record has been created using [employer]  Chart creation errors have been sought, but may not always  have been located. Such creation errors do not reflect on  the standard of  medical care.

## 2021-09-12 ENCOUNTER — Ambulatory Visit: Payer: Commercial Managed Care - PPO | Admitting: Physician Assistant

## 2021-09-13 ENCOUNTER — Ambulatory Visit (HOSPITAL_COMMUNITY)
Admission: RE | Admit: 2021-09-13 | Discharge: 2021-09-13 | Disposition: A | Discharge status: Home / Self Care | Payer: Commercial Managed Care - PPO | Source: Ambulatory Visit | Attending: Physician Assistant | Admitting: Physician Assistant

## 2021-09-13 DIAGNOSIS — I4891 Unspecified atrial fibrillation: Secondary | ICD-10-CM | POA: Insufficient documentation

## 2021-09-13 LAB — BASIC METABOLIC PANEL
Anion gap: 7 (ref 5–15)
BUN: 10 mg/dL (ref 8–23)
CO2: 25 mmol/L (ref 22–32)
Calcium: 9.3 mg/dL (ref 8.9–10.3)
Chloride: 104 mmol/L (ref 98–111)
Creatinine, Ser: 0.99 mg/dL (ref 0.61–1.24)
GFR, Estimated: >60 mL/min (ref >60)
Glucose, Bld: 148 mg/dL — ABNORMAL HIGH (ref 70–99)
Potassium: 3.9 mmol/L (ref 3.5–5.1)
Sodium: 136 mmol/L (ref 135–145)

## 2021-09-16 ENCOUNTER — Encounter: Payer: Self-pay | Admitting: Internal Medicine

## 2021-09-16 ENCOUNTER — Ambulatory Visit (INDEPENDENT_AMBULATORY_CARE_PROVIDER_SITE_OTHER): Payer: Commercial Managed Care - PPO | Admitting: Internal Medicine

## 2021-09-16 VITALS — BP 142/88 | HR 80 | Resp 12 | Ht 77.0 in | Wt 216.2 lb

## 2021-09-16 DIAGNOSIS — R051 Acute cough: Secondary | ICD-10-CM

## 2021-09-16 DIAGNOSIS — M7021 Olecranon bursitis, right elbow: Secondary | ICD-10-CM

## 2021-09-16 DIAGNOSIS — Z79899 Other long term (current) drug therapy: Secondary | ICD-10-CM | POA: Diagnosis not present

## 2021-09-16 DIAGNOSIS — M059 Rheumatoid arthritis with rheumatoid factor, unspecified: Secondary | ICD-10-CM

## 2021-09-17 LAB — COMPLETE METABOLIC PANEL WITH GFR
AG Ratio: 1.7 (calc) (ref 1.0–2.5)
ALT: 13 U/L (ref 9–46)
AST: 15 U/L (ref 10–35)
Albumin: 4 g/dL (ref 3.6–5.1)
Alkaline phosphatase (APISO): 49 U/L (ref 35–144)
BUN: 13 mg/dL (ref 7–25)
CO2: 23 mmol/L (ref 20–32)
Calcium: 9.2 mg/dL (ref 8.6–10.3)
Chloride: 106 mmol/L (ref 98–110)
Creat: 1 mg/dL (ref 0.70–1.35)
Globulin: 2.4 g/dL (calc) (ref 1.9–3.7)
Glucose, Bld: 90 mg/dL (ref 65–99)
Potassium: 4.4 mmol/L (ref 3.5–5.3)
Sodium: 138 mmol/L (ref 135–146)
Total Bilirubin: 0.5 mg/dL (ref 0.2–1.2)
Total Protein: 6.4 g/dL (ref 6.1–8.1)
eGFR: 84 mL/min/{1.73_m2} (ref >=60)

## 2021-09-17 LAB — CBC WITH DIFFERENTIAL/PLATELET
Absolute Monocytes: 807 cells/uL (ref 200–950)
Basophils Absolute: 62 cells/uL (ref 0–200)
Basophils Relative: 0.9 %
Eosinophils Absolute: 221 cells/uL (ref 15–500)
Eosinophils Relative: 3.2 %
HCT: 46.2 % (ref 38.5–50.0)
Hemoglobin: 15.8 g/dL (ref 13.2–17.1)
Lymphs Abs: 1201 cells/uL (ref 850–3900)
MCH: 32 pg (ref 27.0–33.0)
MCHC: 34.2 g/dL (ref 32.0–36.0)
MCV: 93.5 fL (ref 80.0–100.0)
MPV: 10.1 fL (ref 7.5–12.5)
Monocytes Relative: 11.7 %
Neutro Abs: 4609 cells/uL (ref 1500–7800)
Neutrophils Relative %: 66.8 %
Platelets: 273 10*3/uL (ref 140–400)
RBC: 4.94 10*6/uL (ref 4.20–5.80)
RDW: 13.4 % (ref 11.0–15.0)
Total Lymphocyte: 17.4 %
WBC: 6.9 10*3/uL (ref 3.8–10.8)

## 2021-09-17 LAB — SEDIMENTATION RATE: Sed Rate: 6 mm/h (ref 0–20)

## 2021-09-18 NOTE — Progress Notes (Signed)
Lab results look fine for continuing methotrexate.

## 2021-09-20 NOTE — Progress Notes (Signed)
Sorry to hear that. I don't recommend draining this again right now due to risks. Is it causing any pain after coming back? He can wear an elbow pad or brace for this ad can ice this for swelling. If hurting very badly could call in a steroid medication.

## 2021-09-30 ENCOUNTER — Other Ambulatory Visit: Payer: Self-pay | Admitting: Physician Assistant

## 2021-09-30 DIAGNOSIS — M059 Rheumatoid arthritis with rheumatoid factor, unspecified: Secondary | ICD-10-CM

## 2021-09-30 NOTE — Telephone Encounter (Signed)
Prescription refill request for Eliquis received. Indication:Afib Last office visit:2/23 Scr:1.0 Age: 66 Weight:98.1 kg  Prescription refilled

## 2021-09-30 NOTE — Telephone Encounter (Signed)
Next Visit: 12/30/2021  Last Visit: 09/16/2021  Last Fill: 07/06/2021  Dx: Seropositive rheumatoid arthritis   Current Dose per office note on 2/84/1324: folic acid 1 mg daily  Okay to refill Folic Acid?

## 2021-11-25 ENCOUNTER — Other Ambulatory Visit (HOSPITAL_COMMUNITY): Payer: Self-pay | Admitting: Physician Assistant

## 2021-12-02 ENCOUNTER — Ambulatory Visit (HOSPITAL_COMMUNITY): Payer: Commercial Managed Care - PPO | Admitting: Physician Assistant

## 2021-12-09 ENCOUNTER — Encounter (HOSPITAL_COMMUNITY): Payer: Self-pay | Admitting: Physician Assistant

## 2021-12-09 ENCOUNTER — Ambulatory Visit (HOSPITAL_COMMUNITY)
Admission: RE | Admit: 2021-12-09 | Discharge: 2021-12-09 | Disposition: A | Discharge status: Home / Self Care | Payer: Commercial Managed Care - PPO | Source: Ambulatory Visit | Attending: Physician Assistant | Admitting: Physician Assistant

## 2021-12-09 VITALS — BP 136/88 | HR 60 | Ht 77.0 in | Wt 220.0 lb

## 2021-12-09 DIAGNOSIS — I4819 Other persistent atrial fibrillation: Secondary | ICD-10-CM

## 2021-12-09 DIAGNOSIS — D6869 Other thrombophilia: Secondary | ICD-10-CM | POA: Diagnosis not present

## 2021-12-09 DIAGNOSIS — I4892 Unspecified atrial flutter: Secondary | ICD-10-CM | POA: Insufficient documentation

## 2021-12-09 LAB — BASIC METABOLIC PANEL
Anion gap: 7 (ref 5–15)
BUN: 11 mg/dL (ref 8–23)
CO2: 24 mmol/L (ref 22–32)
Calcium: 8.9 mg/dL (ref 8.9–10.3)
Chloride: 108 mmol/L (ref 98–111)
Creatinine, Ser: 0.96 mg/dL (ref 0.61–1.24)
GFR, Estimated: >60 mL/min (ref >60)
Glucose, Bld: 102 mg/dL — ABNORMAL HIGH (ref 70–99)
Potassium: 4.2 mmol/L (ref 3.5–5.1)
Sodium: 139 mmol/L (ref 135–145)

## 2021-12-09 LAB — MAGNESIUM: Magnesium: 1.9 mg/dL (ref 1.7–2.4)

## 2021-12-09 NOTE — Progress Notes (Signed)
Primary Care Physician: Lois Huxley, PA Primary Cardiologist: Dr Harrington Challenger Primary Electrophysiologist: Dr Quentin Ore  Referring Physician: Dr Dwana Melena Peter Mcmillan is a 66 y.o. male with a history of atrial flutter, atrial fibrillation, systolic dysfunction, RA who presents for follow up in the [facility]. Patient presented to the hospital 10/05/20 with afib RVR and was started on Eliquis for a CHADS2VASC score of 1. He underwent TEE guided DCCV on 10/06/20. He was hospitalized again 11/01/20 for progressive dyspnea and lower extremity edema. He was cardioverted in the ED then admitted for observation and diuresis. He lost almost 30 lbs of fluid. Patient presented to the ER  8/14 with atrial flutter with RVR. He was successfully cardioverted.   Patient underwent afib and flutter ablation with Dr Quentin Ore on 12/17/20. Unfortunately, he had recurrence of rapid afib and had another DCCV in the ED on 12/28/20. His metoprolol was also increased. Patient is s/p dofetilide loading 1/3-04/08/21. He converted with the medication and did not require DCCV.   On follow up today, patient reports that he has done well since his last visit. He did have one episode of afib which lasted about 1-2 hours after working outside in hot weather. Otherwise, he has not noticed any elevated heart rates. No bleeding issues on anticoagulation.   Today, he denies symptoms of palpitations, chest pain, shortness of breath, orthopnea, PND, lower extremity edema, dizziness, presyncope, syncope, snoring, daytime somnolence, bleeding, or neurologic sequela. The patient is tolerating medications without difficulties and is otherwise without complaint today.    Atrial Fibrillation Risk Factors:  he does not have symptoms or diagnosis of sleep apnea. he does not have a history of rheumatic fever. he does not have a history of alcohol use. The patient does have a history of early familial atrial  fibrillation or other arrhythmias. Mother has afib.  he has a BMI of Body mass index is 26.09 kg/m.Marland Kitchen Filed Weights   12/09/21 0901  Weight: 99.8 kg    Family History  Problem Relation Age of Onset   Heart attack Father    Heart attack Mother    Dementia Mother    Healthy Sister    Healthy Son      Atrial Fibrillation Management history:  Previous antiarrhythmic drugs: dofetilide  Previous cardioversions: 10/06/20, 11/01/20, 11/14/20, 12/28/20 Previous ablations: 12/17/20 CHADS2VASC score: 1 Anticoagulation history: Eliquis   Past Medical History:  Diagnosis Date   Atrial flutter ([place])    Bursitis of right elbow    Erectile dysfunction    Rheumatoid arthritis ([place])    Tobacco abuse    Smokeless tobacco   Past Surgical History:  Procedure Laterality Date   ATRIAL FIBRILLATION ABLATION N/A 12/17/2020   Procedure: ATRIAL FIBRILLATION ABLATION;  Surgeon: Vickie Epley, MD;  Location: [facility];  Service: Cardiovascular;  Laterality: N/A;   [place] N/A 10/06/2020   Procedure: CARDIOVERSION;  Surgeon: Donato Heinz, MD;  Location: [facility];  Service: Cardiovascular;  Laterality: N/A;   MELANOMA EXCISION  2020   TEE WITHOUT CARDIOVERSION N/A 10/06/2020   Procedure: TRANSESOPHAGEAL ECHOCARDIOGRAM (TEE);  Surgeon: Donato Heinz, MD;  Location: [facility];  Service: Cardiovascular;  Laterality: N/A;   TEE WITHOUT CARDIOVERSION N/A 12/17/2020   Procedure: TRANSESOPHAGEAL ECHOCARDIOGRAM (TEE);  Surgeon: Geralynn Rile, MD;  Location: [facility];  Service: Cardiovascular;  Laterality: N/A;    Current Outpatient Medications  Medication Sig Dispense  Refill   acetaminophen (TYLENOL) 500 MG tablet Take 500 mg by mouth every 6 (six) hours as needed (Arthritis).     apixaban (ELIQUIS) 5 MG TABS tablet TAKE 1 TABLET BY MOUTH TWICE A DAY 60 tablet 5   Cholecalciferol (VITAMIN D) 50 MCG (2000 UT) tablet Take 2  tablets (4,000 Units total) by mouth daily. 180 tablet 3   diclofenac Sodium (VOLTAREN) 1 % GEL APPLY 2 GRAMS TOPICALLY 4 TIMES DAILY ASNEEDED 100 g 0   dofetilide (TIKOSYN) 250 MCG capsule TAKE 1 CAPSULE BY MOUTH TWICE DAILY 60 capsule 6   Elderberry 500 MG CAPS Take 1 tablet by mouth daily.     ENTRESTO 24-26 MG TAKE 1 TABLET BY MOUTH TWICE (2) DAILY 188 tablet 3   folic acid (FOLVITE) 1 MG tablet TAKE 1 TABLET BY MOUTH ONCE A DAY 90 tablet 3   loratadine (CLARITIN) 10 MG tablet Take 10 mg by mouth daily as needed for allergies.     methotrexate 2.5 MG tablet Take 10 tablets (25 mg total) by mouth once a week. 40 tablet 2   metoprolol succinate (TOPROL-XL) 50 MG 24 hr tablet Take 1 tablet (50 mg total) by mouth 2 (two) times daily. Take with or immediately following a meal. 180 tablet 2   potassium chloride (KLOR-CON M) 10 MEQ tablet Take 1 tablet (10 mEq total) by mouth daily. 90 tablet 1   sildenafil (REVATIO) 20 MG tablet TAKE 2 TO 5 TABLETS BY MOUTH 30 MINUTES PRIOR TO SEXUAL ACTIVITY     vitamin C (ASCORBIC ACID) 500 MG tablet Take 250 mg by mouth daily.     No current facility-administered medications for this encounter.    Allergies  Allergen Reactions   Codeine Other (See Comments)    agitation   Other Hives, Swelling and Other (See Comments)    make feel weird; hives/swelling   Penicillin G Other (See Comments)   Penicillins Itching, Swelling and Rash    Reaction: 5 years ago    Social History   Socioeconomic History   Marital status: Married    Spouse name: Not on file   Number of children: 1   Years of education: Not on file   Highest education level: Associate degree: occupational, Hotel manager, or vocational program  Occupational History   Occupation: Clinical biochemist  Tobacco Use   Smoking status: Never    Passive exposure: Never   Smokeless tobacco: Current    Types: Snuff   Tobacco comments:    1/4 can Snuff daily 04/05/2021  Vaping Use   Vaping Use: Never used   Substance and Sexual Activity   Alcohol use: Yes    Comment: 1 beer every 2 weeks 12/09/21   Drug use: No   Sexual activity: Not on file  Other Topics Concern   Not on file  Social History Narrative   Not on file   Social Determinants of Health   Financial Resource Strain: High Risk (11/15/2020)   Overall Financial Resource Strain (CARDIA)    Difficulty of Paying Living Expenses: Hard  Food Insecurity: No Food Insecurity (11/15/2020)   Hunger Vital Sign    Worried About Running Out of Food in the Last Year: Never true    Ran Out of Food in the Last Year: Never true  Transportation Needs: No Transportation Needs (11/15/2020)   PRAPARE - Hydrologist (Medical): No    Lack of Transportation (Non-Medical): No  Physical Activity: Not on file  Stress:  Not on file  Social Connections: Not on file  Intimate Partner Violence: Not on file     ROS- All systems are reviewed and negative except as per the HPI above.  Physical Exam: Vitals:   12/09/21 0901  BP: 136/88  Pulse: 60  Weight: 99.8 kg  Height: '6\' 5"'$  (1.956 m)     GEN- The patient is a well appearing male, alert and oriented x 3 today.   HEENT-head normocephalic, atraumatic, sclera clear, conjunctiva pink, hearing intact, trachea midline. Lungs- Clear to ausculation bilaterally, normal work of breathing Heart- Regular rate and rhythm, no murmurs, rubs or gallops  GI- soft, NT, ND, + BS Extremities- no clubbing, cyanosis, or edema MS- no significant deformity or atrophy Skin- no rash or lesion Psych- euthymic mood, full affect Neuro- strength and sensation are intact   Wt Readings from Last 3 Encounters:  12/09/21 99.8 kg  09/16/21 98.1 kg  08/19/21 96.7 kg    EKG today demonstrates  SR Vent. rate 60 BPM PR interval 158 ms QRS duration 86 ms QT/QTcB 470/470 ms  TEE 10/06/20 demonstrated  1. Left ventricular ejection fraction, by estimation, is 40 to 45%. The  left ventricle has  mildly decreased function. The left ventricle  demonstrates global hypokinesis.   2. Right ventricular systolic function is normal. The right ventricular  size is mildly enlarged.   3. Left atrial size was mildly dilated. No left atrial/left atrial  appendage thrombus was detected.   4. The mitral valve is normal in structure. Mild mitral valve  regurgitation.   5. The aortic valve is tricuspid. Aortic valve regurgitation is not  visualized.   Conclusion(s)/Recommendation(s): No LA/LAA thrombus identified. Successful cardioversion performed with restoration of normal sinus rhythm.   Epic records are reviewed at length today  CHA2DS2-VASc Score = 2  The patient's score is based upon: CHF History: 1 HTN History: 0 Diabetes History: 0 Stroke History: 0 Vascular Disease History: 0 Age Score: 1 Gender Score: 0      ASSESSMENT AND PLAN: 1. Persistent Atrial Fibrillation/atrial flutter The patient's CHA2DS2-VASc score is 2, indicating a 2.2% annual risk of stroke.   S/p afib and flutter ablation with Dr Quentin Ore 12/17/20  S/p dofetilide loading 1/3-04/08/21 Patient appears to be maintaining SR. Continue Toprol 50 mg BID Continue dofetilide 250 mcg BID. QT stable. Check bmet/mag today. Continue Eliquis 5 mg BID  2. H/o systolic dysfunction  EF 57-01% on pre ablation TEE Fluid status appears stable.   Follow up in the AF clinic in 4 months.     [facility] [address] [place], [place] [address] [phone]

## 2021-12-12 ENCOUNTER — Telehealth: Payer: Self-pay

## 2021-12-12 ENCOUNTER — Ambulatory Visit: Payer: Commercial Managed Care - PPO | Admitting: Internal Medicine

## 2021-12-12 ENCOUNTER — Other Ambulatory Visit: Payer: Self-pay

## 2021-12-12 ENCOUNTER — Encounter: Payer: Self-pay | Admitting: Internal Medicine

## 2021-12-12 MED ORDER — MAGNESIUM OXIDE -MG SUPPLEMENT 200 MG PO TABS: 200.0000 mg | ORAL_TABLET | Freq: Every day | ORAL | 3 refills | Status: DC

## 2021-12-12 NOTE — Telephone Encounter (Signed)
Pt added to Dr Harrington Challenger' schedule today by the [facility] to fill an opening this afternoon for which he had to leave work... after bringing the pt back and Dr Harrington Challenger reviewed his chart... he had just been seen by the Afib clinic 12/09/21 and doing well so the appt was premature... Dr Harrington Challenger spoke with the pt and based on his recent labwork... we sent in Mag 200 mg to be taken daily.. the pt verbalized understanding.   Appt was cancelled so he would not incur an OV charge.

## 2021-12-12 NOTE — Progress Notes (Signed)
Cardiology Office Note   Date:  12/12/2021   ID:  Mcmillan, Peter 10/01/1955, MRN [id number]  PCP:  Lois Huxley, PA  Cardiologist:   Dorris Carnes, MD       History of Present Illness: Peter Mcmillan is a 66 y.o. male with a history of 66 y.o. male with a history of atrial flutter, atrial fibrillation, systolic dysfunction, RA who presents for follow up in the [facility]. Patient presented to the hospital 10/05/20 with afib RVR and was started on Eliquis for a CHADS2VASC score of 1. He underwent TEE guided DCCV on 10/06/20. He was hospitalized again 11/01/20 for progressive dyspnea and lower extremity edema. He was cardioverted in the ED then admitted for observation and diuresis. Patient presented to the ER  8/14 with atrial flutter with RVR. He was successfully cardioverted.    Patient underwent afib and flutter ablation by Grayce Sessions on 12/17/20. Unfortunately, he had recurrence of rapid afib and had another DCCV in the ED on 12/28/20. His metoprolol was also increased.   The pt was last seen by  Louretta Shorten on Jan 18 2021   Since seen the pt has done OK    Breathing is good  Rare isolated palpitation.   No CP    Active   Working   Walks 9000 to 15000 steps per day    I saw the pt in clinic in Oct 2022  After I saw him he was seen by C lambert.   In Jan 2023 he underwent Tikosyn loading       Peter Mcmillan is a 66 y.o. male with a history of atrial flutter, atrial fibrillation, systolic dysfunction, RA who presents for follow up in the [facility]. Patient presented to the hospital 10/05/20 with afib RVR and was started on Eliquis for a CHADS2VASC score of 1. He underwent TEE guided DCCV on 10/06/20. He was hospitalized again 11/01/20 for progressive dyspnea and lower extremity edema. He was cardioverted in the ED then admitted for observation and diuresis. He lost almost 30 lbs of fluid. Patient presented to the ER  8/14  with atrial flutter with RVR. He was successfully cardioverted.    Patient underwent afib and flutter ablation with Dr Quentin Ore on 12/17/20. Unfortunately, he had recurrence of rapid afib and had another DCCV in the ED on 12/28/20. His metoprolol was also increased. Patient is s/p dofetilide loading 1/3-04/08/21. He converted with the medication and did not require DCCV.    On follow up today, patient reports that he has done well since his last visit. He did have one episode of afib which lasted about 1-2 hours after working outside in hot weather. Otherwise, he has not noticed any elevated heart rates. No bleeding issues on anticoagulation.    Today, he denies symptoms of palpitations, chest pain, shortness of breath, orthopnea, PND, lower extremity edema, dizziness, presyncope, syncope, snoring, daytime somnolence, bleeding, or neurologic sequela. The patient is tolerating medications without difficulties and is otherwise without complaint today.         Current Meds  Medication Sig   apixaban (ELIQUIS) 5 MG TABS tablet TAKE 1 TABLET BY MOUTH TWICE A DAY   Cholecalciferol (VITAMIN D) 50 MCG (2000 UT) tablet Take 2 tablets (4,000 Units total) by mouth daily.   dofetilide (TIKOSYN) 250 MCG capsule TAKE 1 CAPSULE BY MOUTH TWICE DAILY   Elderberry 500 MG CAPS Take 1 tablet by mouth daily.  ENTRESTO 24-26 MG TAKE 1 TABLET BY MOUTH TWICE (2) DAILY   folic acid (FOLVITE) 1 MG tablet TAKE 1 TABLET BY MOUTH ONCE A DAY   methotrexate 2.5 MG tablet Take 10 tablets (25 mg total) by mouth once a week. (Patient taking differently: Take 25 mg by mouth once a week. 6 tablets)   potassium chloride (KLOR-CON M) 10 MEQ tablet Take 1 tablet (10 mEq total) by mouth daily.   vitamin C (ASCORBIC ACID) 500 MG tablet Take 250 mg by mouth daily.     Allergies:   Codeine, Other, Penicillin g, and Penicillins   Past Medical History:  Diagnosis Date   Atrial flutter ([place])    Bursitis of right elbow    Erectile  dysfunction    Rheumatoid arthritis ([place])    Tobacco abuse    Smokeless tobacco    Past Surgical History:  Procedure Laterality Date   ATRIAL FIBRILLATION ABLATION N/A 12/17/2020   Procedure: ATRIAL FIBRILLATION ABLATION;  Surgeon: Vickie Epley, MD;  Location: [facility];  Service: Cardiovascular;  Laterality: N/A;   [place] N/A 10/06/2020   Procedure: CARDIOVERSION;  Surgeon: Donato Heinz, MD;  Location: [facility];  Service: Cardiovascular;  Laterality: N/A;   MELANOMA EXCISION  2020   TEE WITHOUT CARDIOVERSION N/A 10/06/2020   Procedure: TRANSESOPHAGEAL ECHOCARDIOGRAM (TEE);  Surgeon: Donato Heinz, MD;  Location: [facility];  Service: Cardiovascular;  Laterality: N/A;   TEE WITHOUT CARDIOVERSION N/A 12/17/2020   Procedure: TRANSESOPHAGEAL ECHOCARDIOGRAM (TEE);  Surgeon: Geralynn Rile, MD;  Location: [facility];  Service: Cardiovascular;  Laterality: N/A;     Social History:  The patient  reports that he has never smoked. He has never been exposed to tobacco smoke. His smokeless tobacco use includes snuff. He reports current alcohol use. He reports that he does not use drugs.   Family History:  The patient's family history includes Dementia in his mother; Healthy in his sister and son; Heart attack in his father and mother.    ROS:  Please see the history of present illness. All other systems are reviewed and  Negative to the above problem except as noted.    PHYSICAL EXAM: VS:  BP 120/70   Pulse 85   Ht '6\' 5"'$  (1.956 m)   Wt 225 lb 9.6 oz (102.3 kg)   SpO2 96%   BMI 26.75 kg/m   GEN: Well nourished, well developed, in no acute distress  HEENT: normal  Neck: no JVD, carotid bruits, or masses Cardiac: RRR; no murmurs, rubs, or gallops,no edema  Respiratory:  clear to auscultation bilaterally, normal work of breathing GI: soft, nontender, nondistended, + BS  No hepatomegaly  MS: no deformity Moving  all extremities   Skin: warm and dry, no rash Neuro:  Strength and sensation are intact Psych: euthymic mood, full affect   EKG:  EKG is ordered today.   Lipid Panel    Component Value Date/Time   CHOL 137 10/05/2020 0844   TRIG 34 10/05/2020 0844   HDL 54 10/05/2020 0844   CHOLHDL 2.5 10/05/2020 0844   VLDL 7 10/05/2020 0844   LDLCALC 76 10/05/2020 0844      Wt Readings from Last 3 Encounters:  12/12/21 225 lb 9.6 oz (102.3 kg)  12/09/21 220 lb (99.8 kg)  09/16/21 216 lb 3.2 oz (98.1 kg)      ASSESSMENT AND PLAN:     Current medicines are reviewed at  length with the patient today.  The patient does not have concerns regarding medicines.  Signed, Dorris Carnes, MD  12/12/2021 1:43 PM    [facility] [place], [place],   [address] Phone: [phone]; Fax: [phone]

## 2021-12-14 NOTE — Telephone Encounter (Signed)
I saw pt.   He was doing good    Confused by appt since he was just seen in afib clinic  Mg slightly low on lab check so I added supplement.

## 2021-12-15 NOTE — Progress Notes (Signed)
This encounter was created in error - please disregard.

## 2021-12-20 NOTE — Progress Notes (Deleted)
Office Visit Note  Patient: Peter Mcmillan             Date of Birth: Dec 28, 1955           MRN: [id number]             PCP: Lois Huxley, PA Referring: Lois Huxley, [place] Visit Date: 12/30/2021   Subjective:  No chief complaint on file.   History of Present Illness: Donold Marotto is a 66 y.o. 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  ***   Previous HPI 06/13/2021  Hattie Aguinaldo Mcdiarmid is a 66 y.o. 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 He missed one does of methotrexate due to running out of his medication and now has increased symptoms with bilateral hand pain and swelling. Right hand is worst affected and difficult to use normally. He also has a new cough ongoing for the past month it is not productive. Cough is most common when talking, when sitting and eating, or after he lies down in bed to sleep. He is not yet taking any seasonal allergy medication for the year. He is not taking any cough medicines.   Previous HPI 05/04/21 Javed Cotto Wiegel is a 66 y.o. 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 weekly.  He stopped taking the Orencia injections for about a month ever since he was hospitalized for his A. fib.  He feels there was not much difference when he took the shots and has not noticed much difference since stopping them.  He continues having some joint pain stiffness and swelling mostly affecting his hands and knees.  He is curious about any other anti-inflammatory medicine or supplements to try in addition to his RA medicines.   Previous HPI 09/23/20 Ahmaad Neidhardt is a 66 y.o. male here for follow-up of seropositive rheumatoid arthritis on  methotrexate 15 mg p.o. weekly and prednisone 20 mg daily.  He feels his symptoms have improved taking the medications although worsened greatly when he decrease his prednisone down to 10 mg as directed and so went back to taking 20 daily.  He has been out of the methotrexate since about 2 weeks ago.     No Rheumatology ROS completed.   PMFS History:  Patient Active Problem List   Diagnosis Date Noted   Secondary hypercoagulable state ([place]) 04/05/2021   Atypical atrial flutter ([place]) 04/05/2021   Atrial fibrillation ([place]) 12/17/2020   Rapid atrial fibrillation ([place]) 11/01/2020   Atrial flutter with rapid ventricular response ([place]) 10/05/2020   Seropositive rheumatoid arthritis ([place]) 07/15/2020   High risk medication use 07/15/2020   Allergic rhinitis due to pollen 06/11/2020   ED (erectile dysfunction) of organic origin 06/11/2020   Essential hypertension 06/11/2020   Gastro-esophageal reflux disease without esophagitis 06/11/2020   Hyperlipidemia 06/11/2020   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[place]) 06/11/2020   Prediabetes 06/11/2020   Tobacco user 06/11/2020   Vitamin D deficiency 06/11/2020   Bilateral hand pain 06/11/2020   Bilateral shoulder pain 06/11/2020   Atrial flutter ([place]) 12/09/2013   PULMONARY NODULE 08/18/2008   COUGH 08/18/2008    Past Medical History:  Diagnosis Date   Atrial flutter ([place])  Bursitis of right elbow    Erectile dysfunction    Rheumatoid arthritis ([facility])    Tobacco abuse    Smokeless tobacco    Family History  Problem Relation Age of Onset   Heart attack Father    Heart attack Mother    Dementia Mother    Healthy Sister    Healthy Son    Past Surgical History:  Procedure Laterality Date   ATRIAL FIBRILLATION ABLATION N/A 12/17/2020   Procedure: ATRIAL FIBRILLATION ABLATION;  Surgeon: Vickie Epley, MD;  Location: [facility] [facility];  Service: Cardiovascular;  Laterality: N/A;   [place] N/A 10/06/2020    Procedure: CARDIOVERSION;  Surgeon: Donato Heinz, MD;  Location: [facility];  Service: Cardiovascular;  Laterality: N/A;   MELANOMA EXCISION  2020   TEE WITHOUT CARDIOVERSION N/A 10/06/2020   Procedure: TRANSESOPHAGEAL ECHOCARDIOGRAM (TEE);  Surgeon: Donato Heinz, MD;  Location: [facility];  Service: Cardiovascular;  Laterality: N/A;   TEE WITHOUT CARDIOVERSION N/A 12/17/2020   Procedure: TRANSESOPHAGEAL ECHOCARDIOGRAM (TEE);  Surgeon: Geralynn Rile, MD;  Location: [facility];  Service: Cardiovascular;  Laterality: N/A;   Social History   Social History Narrative   Not on file   Immunization History  Administered Date(s) Administered   Hep A / Hep B 02/17/2011   Influenza Split 01/29/2017   Moderna Sars-Covid-2 Vaccination 07/09/2019, 07/09/2019, 08/06/2019, 08/06/2019   Pneumococcal Polysaccharide-23 02/12/2009   Tdap 02/12/2009, 06/13/2017, 06/18/2020     Objective: Vital Signs: There were no vitals taken for this visit.   Physical Exam   Musculoskeletal Exam: ***  CDAI Exam: CDAI Score: -- Patient Global: --; Provider Global: -- Swollen: --; Tender: -- Joint Exam 12/30/2021   No joint exam has been documented for this visit   There is currently no information documented on the homunculus. Go to the Rheumatology activity and complete the homunculus joint exam.  Investigation: No additional findings.  Imaging: No results found.  Recent Labs: Lab Results  Component Value Date   WBC 6.9 09/16/2021   HGB 15.8 09/16/2021   PLT 273 09/16/2021   NA 139 12/09/2021   K 4.2 12/09/2021   CL 108 12/09/2021   CO2 24 12/09/2021   GLUCOSE 102 (H) 12/09/2021   BUN 11 12/09/2021   CREATININE 0.96 12/09/2021   BILITOT 0.5 09/16/2021   ALKPHOS 44 12/28/2020   AST 15 09/16/2021   ALT 13 09/16/2021   PROT 6.4 09/16/2021   ALBUMIN 3.8 12/28/2020   CALCIUM 8.9 12/09/2021   GFRAA 111 09/23/2020   QFTBGOLDPLUS NEGATIVE 10/20/2020     Speciality Comments: No specialty comments available.  Procedures:  No procedures performed Allergies: Codeine, Other, Penicillin g, and Penicillins   Assessment / Plan:     Visit Diagnoses: No diagnosis found.  ***  Orders: No orders of the defined types were placed in this encounter.  No orders of the defined types were placed in this encounter.    Follow-Up Instructions: No follow-ups on file.   Bertram Savin, RT  Note - This record has been created using Editor, commissioning.  Chart creation errors have been sought, but may not always  have been located. Such creation errors do not reflect on  the standard of medical care.

## 2021-12-27 ENCOUNTER — Other Ambulatory Visit: Payer: Self-pay | Admitting: Internal Medicine

## 2021-12-27 DIAGNOSIS — M059 Rheumatoid arthritis with rheumatoid factor, unspecified: Secondary | ICD-10-CM

## 2021-12-27 NOTE — Telephone Encounter (Signed)
Next Visit: 12/30/2021  Last Visit: 09/16/2021  Last Fill: 06/13/2021  DX: Seropositive rheumatoid arthritis   Current Dose per office note 09/16/2021: methotrexate to 25 mg p.o. weekly   Labs: 12/09/2021 BMP Glucose 102 09/16/2021 CBC WNL  Okay to refill methotrexate?

## 2021-12-30 ENCOUNTER — Ambulatory Visit: Payer: Commercial Managed Care - PPO | Attending: Internal Medicine | Admitting: Internal Medicine

## 2021-12-30 DIAGNOSIS — R051 Acute cough: Secondary | ICD-10-CM

## 2021-12-30 DIAGNOSIS — Z79899 Other long term (current) drug therapy: Secondary | ICD-10-CM

## 2021-12-30 DIAGNOSIS — M059 Rheumatoid arthritis with rheumatoid factor, unspecified: Secondary | ICD-10-CM

## 2022-01-01 NOTE — Telephone Encounter (Signed)
Recommend patient to reschedule missed appointment. Will send refill for next month in the meantime.

## 2022-01-02 NOTE — Telephone Encounter (Signed)
Patient states he started a new project at work and is working 6 days/week.  Patient states he will call back to reschedule when his work schedules opens up.

## 2022-01-27 ENCOUNTER — Other Ambulatory Visit: Payer: Self-pay | Admitting: Internal Medicine

## 2022-01-27 DIAGNOSIS — M059 Rheumatoid arthritis with rheumatoid factor, unspecified: Secondary | ICD-10-CM

## 2022-01-27 NOTE — Telephone Encounter (Signed)
Next Visit: Due around 12/17/2021. Message sent to the front to schedule.  Last Visit: 09/16/2021  Last Fill: 01/01/2022  DX: Seropositive rheumatoid arthritis   Current Dose per office note 09/16/2021: methotrexate 25 mg p.o. weekly   Labs: 09/16/2021 Lab results look fine for continuing methotrexate.  Called patient to advise that he is due for labs and follow up visit. Patient stated he will call us back to schedule appointment.   Assessment/Plan and Visit note differ in dosage, please advise.  Okay to refill methotrexate?

## 2022-02-20 ENCOUNTER — Other Ambulatory Visit (HOSPITAL_COMMUNITY): Payer: Self-pay | Admitting: Physician Assistant

## 2022-03-24 ENCOUNTER — Other Ambulatory Visit: Payer: Self-pay | Admitting: Physician Assistant

## 2022-03-24 DIAGNOSIS — I4819 Other persistent atrial fibrillation: Secondary | ICD-10-CM

## 2022-03-24 NOTE — Telephone Encounter (Signed)
Eliquis '5mg'$  refill request received. Patient is 66 years old, weight-102.3kg, Crea-0.96 on 12/09/2021, Diagnosis-Afib, and last seen by Adline Peals on 12/09/2021. Dose is appropriate based on dosing criteria. Will send in refill to requested pharmacy.

## 2022-04-14 ENCOUNTER — Ambulatory Visit (HOSPITAL_COMMUNITY): Payer: Commercial Managed Care - PPO | Admitting: Physician Assistant

## 2022-04-14 NOTE — Progress Notes (Incomplete)
Primary Care Physician: Lois Huxley, PA Primary Cardiologist: Dr Harrington Challenger Primary Electrophysiologist: Dr Quentin Ore  Referring Physician: Dr Dwana Melena Myka Lukins is a 67 y.o. male with a history of atrial flutter, atrial fibrillation, systolic dysfunction, RA who presents for follow up in the [facility]. Patient presented to the hospital 10/05/20 with afib RVR and was started on Eliquis for a CHADS2VASC score of 1. He underwent TEE guided DCCV on 10/06/20. He was hospitalized again 11/01/20 for progressive dyspnea and lower extremity edema. He was cardioverted in the ED then admitted for observation and diuresis. He lost almost 30 lbs of fluid. Patient presented to the ER  8/14 with atrial flutter with RVR. He was successfully cardioverted.   Patient underwent afib and flutter ablation with Dr Quentin Ore on 12/17/20. Unfortunately, he had recurrence of rapid afib and had another DCCV in the ED on 12/28/20. His metoprolol was also increased. Patient is s/p dofetilide loading 1/3-04/08/21. He converted with the medication and did not require DCCV.   On follow up today, ***  Today, he denies symptoms of ***palpitations, chest pain, shortness of breath, orthopnea, PND, lower extremity edema, dizziness, presyncope, syncope, snoring, daytime somnolence, bleeding, or neurologic sequela. The patient is tolerating medications without difficulties and is otherwise without complaint today.    Atrial Fibrillation Risk Factors:  he does not have symptoms or diagnosis of sleep apnea. he does not have a history of rheumatic fever. he does not have a history of alcohol use. The patient does have a history of early familial atrial fibrillation or other arrhythmias. Mother has afib.  he has a BMI of There is no height or weight on file to calculate BMI.. There were no vitals filed for this visit.   Family History  Problem Relation Age of Onset   Heart attack Father    Heart  attack Mother    Dementia Mother    Healthy Sister    Healthy Son      Atrial Fibrillation Management history:  Previous antiarrhythmic drugs: dofetilide  Previous cardioversions: 10/06/20, 11/01/20, 11/14/20, 12/28/20 Previous ablations: 12/17/20 CHADS2VASC score: 1 Anticoagulation history: Eliquis   Past Medical History:  Diagnosis Date   Atrial flutter ([place])    Bursitis of right elbow    Erectile dysfunction    Rheumatoid arthritis ([place])    Tobacco abuse    Smokeless tobacco   Past Surgical History:  Procedure Laterality Date   ATRIAL FIBRILLATION ABLATION N/A 12/17/2020   Procedure: ATRIAL FIBRILLATION ABLATION;  Surgeon: Vickie Epley, MD;  Location: [facility];  Service: Cardiovascular;  Laterality: N/A;   [place] N/A 10/06/2020   Procedure: CARDIOVERSION;  Surgeon: Donato Heinz, MD;  Location: [facility];  Service: Cardiovascular;  Laterality: N/A;   MELANOMA EXCISION  2020   TEE WITHOUT CARDIOVERSION N/A 10/06/2020   Procedure: TRANSESOPHAGEAL ECHOCARDIOGRAM (TEE);  Surgeon: Donato Heinz, MD;  Location: [facility];  Service: Cardiovascular;  Laterality: N/A;   TEE WITHOUT CARDIOVERSION N/A 12/17/2020   Procedure: TRANSESOPHAGEAL ECHOCARDIOGRAM (TEE);  Surgeon: Geralynn Rile, MD;  Location: [facility];  Service: Cardiovascular;  Laterality: N/A;    Current Outpatient Medications  Medication Sig Dispense Refill   Cholecalciferol (VITAMIN D) 50 MCG (2000 UT) tablet Take 2 tablets (4,000 Units total) by mouth daily. 180 tablet 3   dofetilide (TIKOSYN) 250 MCG capsule TAKE 1 CAPSULE BY MOUTH TWICE DAILY 60 capsule 6  Elderberry 500 MG CAPS Take 1 tablet by mouth daily.     ELIQUIS 5 MG TABS tablet TAKE 1 TABLET BY MOUTH TWICE A DAY 60 tablet 5   ENTRESTO 24-26 MG TAKE 1 TABLET BY MOUTH TWICE (2) DAILY 170 tablet 3   folic acid (FOLVITE) 1 MG tablet TAKE 1 TABLET BY MOUTH ONCE A DAY 90 tablet 3    Magnesium Oxide -Mg Supplement (MAG-200) 200 MG TABS Take 1 tablet (200 mg total) by mouth daily. 100 tablet 3   methotrexate (RHEUMATREX) 2.5 MG tablet Take 10 tablets (25 mg total) by mouth once a week. 50 tablet 0   metoprolol succinate (TOPROL-XL) 50 MG 24 hr tablet Take 1 tablet (50 mg total) by mouth 2 (two) times daily. Take with or immediately following a meal. 180 tablet 2   potassium chloride (KLOR-CON) 10 MEQ tablet TAKE 1 TABLET BY MOUTH DAILY 90 tablet 2   vitamin C (ASCORBIC ACID) 500 MG tablet Take 250 mg by mouth daily.     No current facility-administered medications for this visit.    Allergies  Allergen Reactions   Codeine Other (See Comments)    agitation   Other Hives, Swelling and Other (See Comments)    make feel weird; hives/swelling   Penicillin G Other (See Comments)   Penicillins Itching, Swelling and Rash    Reaction: 5 years ago    Social History   Socioeconomic History   Marital status: Married    Spouse name: Not on file   Number of children: 1   Years of education: Not on file   Highest education level: Associate degree: occupational, Hotel manager, or vocational program  Occupational History   Occupation: Clinical biochemist  Tobacco Use   Smoking status: Never    Passive exposure: Never   Smokeless tobacco: Current    Types: Snuff   Tobacco comments:    1/4 can Snuff daily 04/05/2021  Vaping Use   Vaping Use: Never used  Substance and Sexual Activity   Alcohol use: Yes    Comment: 1 beer every 2 weeks 12/09/21   Drug use: No   Sexual activity: Not on file  Other Topics Concern   Not on file  Social History Narrative   Not on file   Social Determinants of Health   Financial Resource Strain: High Risk (11/15/2020)   Overall Financial Resource Strain (CARDIA)    Difficulty of Paying Living Expenses: Hard  Food Insecurity: No Food Insecurity (11/15/2020)   Hunger Vital Sign    Worried About Running Out of Food in the Last Year: Never true     Ran Out of Food in the Last Year: Never true  Transportation Needs: No Transportation Needs (11/15/2020)   PRAPARE - Hydrologist (Medical): No    Lack of Transportation (Non-Medical): No  Physical Activity: Not on file  Stress: Not on file  Social Connections: Not on file  Intimate Partner Violence: Not on file     ROS- All systems are reviewed and negative except as per the HPI above.  Physical Exam: There were no vitals filed for this visit.   GEN- The patient is a well appearing *** {Desc; male/male:11659}, alert and oriented x 3 today.   HEENT-head normocephalic, atraumatic, sclera clear, conjunctiva pink, hearing intact, trachea midline. Lungs- Clear to ausculation bilaterally, normal work of breathing Heart- ***Regular rate and rhythm, no murmurs, rubs or gallops  GI- soft, NT, ND, + BS Extremities- no clubbing, cyanosis,  or edema MS- no significant deformity or atrophy Skin- no rash or lesion Psych- euthymic mood, full affect Neuro- strength and sensation are intact   Wt Readings from Last 3 Encounters:  12/12/21 102.3 kg  12/09/21 99.8 kg  09/16/21 98.1 kg    EKG today demonstrates  ***  TEE 10/06/20 demonstrated  1. Left ventricular ejection fraction, by estimation, is 40 to 45%. The  left ventricle has mildly decreased function. The left ventricle  demonstrates global hypokinesis.   2. Right ventricular systolic function is normal. The right ventricular  size is mildly enlarged.   3. Left atrial size was mildly dilated. No left atrial/left atrial  appendage thrombus was detected.   4. The mitral valve is normal in structure. Mild mitral valve  regurgitation.   5. The aortic valve is tricuspid. Aortic valve regurgitation is not  visualized.   Conclusion(s)/Recommendation(s): No LA/LAA thrombus identified. Successful cardioversion performed with restoration of normal sinus rhythm.   Epic records are reviewed at length  today  CHA2DS2-VASc Score =    The patient's score is based upon:    {Confirm score is correct.  If not, click here to update score.  REFRESH note.  :1}     ASSESSMENT AND PLAN: 1. Persistent Atrial Fibrillation/atrial flutter The patient's CHA2DS2-VASc score is  , indicating a  % annual risk of stroke.   S/p afib and flutter ablation with Dr Quentin Ore 12/17/20  S/p dofetilide loading 1/3-04/08/21 *** Continue Toprol 50 mg BID Continue dofetilide 250 mcg BID. QT stable.  Check bmet/mag today.  Continue Eliquis 5 mg BID  2. H/o systolic dysfunction  EF 54-65% on pre ablation TEE Appears euvolemic.  ***   Follow up ***    Adline Peals PA-C Afib [facility] [address] [place], [place] [address] [phone]

## 2022-04-26 ENCOUNTER — Other Ambulatory Visit: Payer: Self-pay | Admitting: Internal Medicine

## 2022-04-26 DIAGNOSIS — M059 Rheumatoid arthritis with rheumatoid factor, unspecified: Secondary | ICD-10-CM

## 2022-05-02 ENCOUNTER — Other Ambulatory Visit: Payer: Self-pay | Admitting: Internal Medicine

## 2022-05-02 DIAGNOSIS — M059 Rheumatoid arthritis with rheumatoid factor, unspecified: Secondary | ICD-10-CM

## 2022-05-11 ENCOUNTER — Encounter (HOSPITAL_COMMUNITY): Payer: Self-pay | Admitting: *Deleted

## 2022-05-24 ENCOUNTER — Other Ambulatory Visit (HOSPITAL_COMMUNITY): Payer: Self-pay | Admitting: *Deleted

## 2022-05-24 MED ORDER — METOPROLOL SUCCINATE ER 50 MG PO TB24: 50.0000 mg | ORAL_TABLET | Freq: Two times a day (BID) | ORAL | 0 refills | Status: DC

## 2022-05-25 ENCOUNTER — Other Ambulatory Visit (HOSPITAL_COMMUNITY): Payer: Self-pay

## 2022-06-09 ENCOUNTER — Ambulatory Visit (HOSPITAL_COMMUNITY): Payer: Commercial Managed Care - PPO | Admitting: Physician Assistant

## 2022-06-12 ENCOUNTER — Other Ambulatory Visit (HOSPITAL_COMMUNITY): Payer: Self-pay | Admitting: Physician Assistant

## 2022-06-12 ENCOUNTER — Other Ambulatory Visit: Payer: Self-pay | Admitting: Internal Medicine

## 2022-06-12 NOTE — Telephone Encounter (Signed)
Pt is to take Vit D 4000 units daily... RX sent in for the correct dosing.

## 2022-06-13 ENCOUNTER — Other Ambulatory Visit (HOSPITAL_COMMUNITY): Payer: Self-pay | Admitting: Physician Assistant

## 2022-06-14 ENCOUNTER — Other Ambulatory Visit: Payer: Self-pay | Admitting: Internal Medicine

## 2022-06-27 ENCOUNTER — Ambulatory Visit (HOSPITAL_COMMUNITY): Payer: Commercial Managed Care - PPO | Admitting: Physician Assistant

## 2022-06-30 ENCOUNTER — Ambulatory Visit (HOSPITAL_COMMUNITY): Payer: Commercial Managed Care - PPO | Admitting: Nurse Practitioner

## 2022-07-03 ENCOUNTER — Encounter (HOSPITAL_COMMUNITY): Payer: Self-pay | Admitting: Physician Assistant

## 2022-07-03 ENCOUNTER — Ambulatory Visit (HOSPITAL_COMMUNITY)
Admission: RE | Admit: 2022-07-03 | Discharge: 2022-07-03 | Disposition: A | Discharge status: Home / Self Care | Payer: Commercial Managed Care - PPO | Source: Ambulatory Visit | Attending: Physician Assistant | Admitting: Physician Assistant

## 2022-07-03 VITALS — BP 108/82 | HR 88 | Ht 77.0 in | Wt 224.2 lb

## 2022-07-03 DIAGNOSIS — Z79899 Other long term (current) drug therapy: Secondary | ICD-10-CM | POA: Diagnosis not present

## 2022-07-03 DIAGNOSIS — Z5181 Encounter for therapeutic drug level monitoring: Secondary | ICD-10-CM

## 2022-07-03 DIAGNOSIS — I4819 Other persistent atrial fibrillation: Secondary | ICD-10-CM | POA: Diagnosis present

## 2022-07-03 DIAGNOSIS — I4892 Unspecified atrial flutter: Secondary | ICD-10-CM | POA: Diagnosis not present

## 2022-07-03 DIAGNOSIS — Z7901 Long term (current) use of anticoagulants: Secondary | ICD-10-CM | POA: Diagnosis not present

## 2022-07-03 LAB — BASIC METABOLIC PANEL
Anion gap: 12 (ref 5–15)
BUN: 13 mg/dL (ref 8–23)
CO2: 22 mmol/L (ref 22–32)
Calcium: 8.6 mg/dL — ABNORMAL LOW (ref 8.9–10.3)
Chloride: 103 mmol/L (ref 98–111)
Creatinine, Ser: 1.09 mg/dL (ref 0.61–1.24)
GFR, Estimated: >60 mL/min (ref >60)
Glucose, Bld: 87 mg/dL (ref 70–99)
Potassium: 4 mmol/L (ref 3.5–5.1)
Sodium: 137 mmol/L (ref 135–145)

## 2022-07-03 LAB — MAGNESIUM: Magnesium: 2.1 mg/dL (ref 1.7–2.4)

## 2022-07-03 NOTE — Progress Notes (Signed)
Primary Care Physician: Lois Huxley, PA Primary Cardiologist: Dr Harrington Challenger Primary Electrophysiologist: Dr Quentin Ore  Referring Physician: Dr Dwana Melena Verdon Grimaud is a 67 y.o. male with a history of atrial flutter, atrial fibrillation, systolic dysfunction, RA who presents for follow up in the [facility]. Patient presented to the hospital 10/05/20 with afib RVR and was started on Eliquis for a CHADS2VASC score of 1. He underwent TEE guided DCCV on 10/06/20. He was hospitalized again 11/01/20 for progressive dyspnea and lower extremity edema. He was cardioverted in the ED then admitted for observation and diuresis. He lost almost 30 lbs of fluid. Patient presented to the ER  8/14 with atrial flutter with RVR. He was successfully cardioverted.   Patient underwent afib and flutter ablation with Dr Quentin Ore on 12/17/20. Unfortunately, he had recurrence of rapid afib and had another DCCV in the ED on 12/28/20. His metoprolol was also increased. Patient is s/p dofetilide loading 1/3-04/08/21. He converted with the medication and did not require DCCV.   On follow up today, patient reports that he has done well since his last visit. He has not had any afib symptoms in the interim. No bleeding issues on anticoagulation.   Today, he denies symptoms of palpitations, chest pain, shortness of breath, orthopnea, PND, lower extremity edema, dizziness, presyncope, syncope, snoring, daytime somnolence, bleeding, or neurologic sequela. The patient is tolerating medications without difficulties and is otherwise without complaint today.    Atrial Fibrillation Risk Factors:  he does not have symptoms or diagnosis of sleep apnea. he does not have a history of rheumatic fever. he does not have a history of alcohol use. The patient does have a history of early familial atrial fibrillation or other arrhythmias. Mother has afib.  he has a BMI of Body mass index is 26.59 kg/m.Marland Kitchen Filed  Weights   07/03/22 1534  Weight: 101.7 kg    Family History  Problem Relation Age of Onset   Heart attack Father    Heart attack Mother    Dementia Mother    Healthy Sister    Healthy Son      Atrial Fibrillation Management history:  Previous antiarrhythmic drugs: dofetilide  Previous cardioversions: 10/06/20, 11/01/20, 11/14/20, 12/28/20 Previous ablations: 12/17/20 CHADS2VASC score: 2 Anticoagulation history: Eliquis   Past Medical History:  Diagnosis Date   Atrial flutter    Bursitis of right elbow    Erectile dysfunction    Rheumatoid arthritis    Tobacco abuse    Smokeless tobacco   Past Surgical History:  Procedure Laterality Date   ATRIAL FIBRILLATION ABLATION N/A 12/17/2020   Procedure: ATRIAL FIBRILLATION ABLATION;  Surgeon: Vickie Epley, MD;  Location: [facility];  Service: Cardiovascular;  Laterality: N/A;   [place] N/A 10/06/2020   Procedure: CARDIOVERSION;  Surgeon: Donato Heinz, MD;  Location: [facility];  Service: Cardiovascular;  Laterality: N/A;   MELANOMA EXCISION  2020   TEE WITHOUT CARDIOVERSION N/A 10/06/2020   Procedure: TRANSESOPHAGEAL ECHOCARDIOGRAM (TEE);  Surgeon: Donato Heinz, MD;  Location: [facility];  Service: Cardiovascular;  Laterality: N/A;   TEE WITHOUT CARDIOVERSION N/A 12/17/2020   Procedure: TRANSESOPHAGEAL ECHOCARDIOGRAM (TEE);  Surgeon: Geralynn Rile, MD;  Location: [facility];  Service: Cardiovascular;  Laterality: N/A;    Current Outpatient Medications  Medication Sig Dispense Refill   Cholecalciferol 100 MCG (4000 UT) CAPS Take 1 capsule (4,000 Units total) by mouth daily in  the afternoon. 100 capsule 3   dofetilide (TIKOSYN) 250 MCG capsule TAKE ONE CAPSULE BY MOUTH TWICE DAILY 60 capsule 0   Elderberry 500 MG CAPS Take 1 tablet by mouth daily.     ELIQUIS 5 MG TABS tablet TAKE 1 TABLET BY MOUTH TWICE A DAY 60 tablet 5   ENTRESTO 24-26 MG TAKE 1 TABLET  BY MOUTH TWICE (2) DAILY 99991111 tablet 3   folic acid (FOLVITE) 1 MG tablet TAKE 1 TABLET BY MOUTH ONCE A DAY 90 tablet 3   Magnesium Oxide -Mg Supplement (MAG-200) 200 MG TABS Take 1 tablet (200 mg total) by mouth daily. 100 tablet 3   methotrexate (RHEUMATREX) 2.5 MG tablet Take 10 tablets (25 mg total) by mouth once a week. 50 tablet 0   metoprolol succinate (TOPROL-XL) 50 MG 24 hr tablet Take 1 tablet (50 mg total) by mouth 2 (two) times daily. Take with or immediately following a meal. 60 tablet 0   potassium chloride (KLOR-CON) 10 MEQ tablet TAKE 1 TABLET BY MOUTH DAILY 90 tablet 2   vitamin C (ASCORBIC ACID) 500 MG tablet Take 250 mg by mouth daily.     No current facility-administered medications for this encounter.    Allergies  Allergen Reactions   Codeine Other (See Comments)    agitation   Other Hives, Swelling and Other (See Comments)    make feel weird; hives/swelling   Penicillin G Other (See Comments)   Penicillins Itching, Swelling and Rash    Reaction: 5 years ago    Social History   Socioeconomic History   Marital status: Married    Spouse name: Not on file   Number of children: 1   Years of education: Not on file   Highest education level: Associate degree: occupational, Hotel manager, or vocational program  Occupational History   Occupation: Clinical biochemist  Tobacco Use   Smoking status: Never    Passive exposure: Never   Smokeless tobacco: Current    Types: Snuff   Tobacco comments:    1/4 can Snuff daily 04/05/2021  Vaping Use   Vaping Use: Never used  Substance and Sexual Activity   Alcohol use: Yes    Comment: 1 beer every 2 weeks 12/09/21   Drug use: No   Sexual activity: Not on file  Other Topics Concern   Not on file  Social History Narrative   Not on file   Social Determinants of Health   Financial Resource Strain: High Risk (11/15/2020)   Overall Financial Resource Strain (CARDIA)    Difficulty of Paying Living Expenses: Hard  Food Insecurity:  No Food Insecurity (11/15/2020)   Hunger Vital Sign    Worried About Running Out of Food in the Last Year: Never true    Ran Out of Food in the Last Year: Never true  Transportation Needs: No Transportation Needs (11/15/2020)   PRAPARE - Hydrologist (Medical): No    Lack of Transportation (Non-Medical): No  Physical Activity: Not on file  Stress: Not on file  Social Connections: Not on file  Intimate Partner Violence: Not on file     ROS- All systems are reviewed and negative except as per the HPI above.  Physical Exam: Vitals:   07/03/22 1534  BP: 108/82  Pulse: 88  Weight: 101.7 kg  Height: 6\' 5"  (1.956 m)    GEN- The patient is a well appearing male, alert and oriented x 3 today.   HEENT-head normocephalic, atraumatic, sclera clear,  conjunctiva pink, hearing intact, trachea midline. Lungs- Clear to ausculation bilaterally, normal work of breathing Heart- Regular rate and rhythm, no murmurs, rubs or gallops  GI- soft, NT, ND, + BS Extremities- no clubbing, cyanosis, or edema MS- no significant deformity or atrophy Skin- no rash or lesion Psych- euthymic mood, full affect Neuro- strength and sensation are intact   Wt Readings from Last 3 Encounters:  07/03/22 101.7 kg  12/12/21 102.3 kg  12/09/21 99.8 kg    EKG today demonstrates  SR Vent. rate 88 BPM PR interval 142 ms QRS duration 80 ms QT/QTcB 348/421 ms  TEE 10/06/20 demonstrated  1. Left ventricular ejection fraction, by estimation, is 40 to 45%. The  left ventricle has mildly decreased function. The left ventricle  demonstrates global hypokinesis.   2. Right ventricular systolic function is normal. The right ventricular  size is mildly enlarged.   3. Left atrial size was mildly dilated. No left atrial/left atrial  appendage thrombus was detected.   4. The mitral valve is normal in structure. Mild mitral valve  regurgitation.   5. The aortic valve is tricuspid. Aortic valve  regurgitation is not  visualized.   Conclusion(s)/Recommendation(s): No LA/LAA thrombus identified. Successful cardioversion performed with restoration of normal sinus rhythm.   Epic records are reviewed at length today  CHA2DS2-VASc Score = 2  The patient's score is based upon: CHF History: 1 HTN History: 0 Diabetes History: 0 Stroke History: 0 Vascular Disease History: 0 Age Score: 1 Gender Score: 0        ASSESSMENT AND PLAN: 1. Persistent Atrial Fibrillation/atrial flutter The patient's CHA2DS2-VASc score is 2, indicating a 2.2% annual risk of stroke.   S/p afib and flutter ablation with Dr Quentin Ore 12/17/20  S/p dofetilide loading 1/3-04/08/21 Patient appears to be maintaining SR.  Continue Toprol 50 mg BID Continue dofetilide 250 mcg BID. QT stable. Check bmet/mag Continue Eliquis 5 mg BID  2. H/o systolic dysfunction  EF [id number] on pre ablation TEE Appears euvolemic.    Follow up in the AF clinic in 6 months.     [facility] [address] [place], [place] [address] [phone]

## 2022-07-05 ENCOUNTER — Other Ambulatory Visit: Payer: Self-pay | Admitting: Cardiology

## 2022-07-05 ENCOUNTER — Other Ambulatory Visit (HOSPITAL_COMMUNITY): Payer: Self-pay | Admitting: Physician Assistant

## 2022-07-17 ENCOUNTER — Other Ambulatory Visit: Payer: Self-pay | Admitting: Internal Medicine

## 2022-07-19 NOTE — Telephone Encounter (Signed)
This is a A-Fib clinic pt 

## 2022-07-22 ENCOUNTER — Telehealth: Payer: Self-pay | Admitting: Internal Medicine

## 2022-07-22 ENCOUNTER — Other Ambulatory Visit: Payer: Self-pay | Admitting: Internal Medicine

## 2022-07-22 DIAGNOSIS — M059 Rheumatoid arthritis with rheumatoid factor, unspecified: Secondary | ICD-10-CM

## 2022-07-22 MED ORDER — PREDNISONE 5 MG PO TABS: ORAL_TABLET | ORAL | 0 refills | Status: AC

## 2022-07-22 NOTE — Telephone Encounter (Signed)
I spoke with Peter Mcmillan he called due to significant worsening of joint pain and swelling especially affecting his hands and knees. He notices increased symptoms for a few weeks ever since spring pollen increased but swelling is worse within the past few days. He has an appointment scheduled 4/22 at 8am. Previously symptoms have improved well when on prednisone without causing exacerbation of his afib. Rx for prednisone 20 mg taper over 12 days sent to [facility] pharmacy today.

## 2022-07-24 ENCOUNTER — Encounter: Payer: Self-pay | Admitting: Internal Medicine

## 2022-07-24 ENCOUNTER — Ambulatory Visit: Payer: Commercial Managed Care - PPO | Attending: Internal Medicine | Admitting: Internal Medicine

## 2022-07-24 VITALS — BP 129/86 | HR 80 | Resp 16 | Ht 77.0 in | Wt 225.0 lb

## 2022-07-24 DIAGNOSIS — M059 Rheumatoid arthritis with rheumatoid factor, unspecified: Secondary | ICD-10-CM | POA: Diagnosis not present

## 2022-07-24 DIAGNOSIS — Z79899 Other long term (current) drug therapy: Secondary | ICD-10-CM

## 2022-07-24 MED ORDER — FOLIC ACID 1 MG PO TABS: 1.0000 mg | ORAL_TABLET | Freq: Every day | ORAL | 3 refills | Status: DC

## 2022-07-24 MED ORDER — METHOTREXATE SODIUM 2.5 MG PO TABS: 25.0000 mg | ORAL_TABLET | ORAL | 0 refills | Status: DC

## 2022-07-24 NOTE — Progress Notes (Signed)
Office Visit Note  Patient: Peter Mcmillan             Date of Birth: 06-23-1955           MRN: [id number]             PCP: Wilfrid Lund, PA Referring: Wilfrid Lund, [place] Visit Date: 07/24/2022   Subjective:  Follow-up (Patient states joint pain and swelling has increase in the last six weeks. Patient states he has pain in his hands, shoulders, knees, and legs.)   History of Present Illness: Peter Mcmillan is a 67 y.o. male here for follow up for seropositive RA previously on methotrexate but has been out of the medication for about 2 months was taking 25 mg p.o. weekly he still on the folic acid 1 mg daily.  He has been experiencing increased joint pain and swelling in his hands shoulders and knees more severe on the right side than left.  Did not recall any other illness or injury prior to symptom worsening.  We spoke by phone 2 days ago and started on prednisone taper he is on 20 mg today is day 3 so far seeing a partial improvement joint pain and swelling is decreased but still has prolonged morning stiffness and still having some swelling in that right hand and shoulder.  He had recent cardiology follow-up everything is doing well and not in A-fib.  Previous HPI 09/16/21 Peter Mcmillan is a 67 y.o. 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  Hand pain is pretty stable without any major flareup or exacerbation.  His current complaint is Mcmillan to right elbow swelling.  He developed painful bursitis this was evaluated at urgent care clinic last month and again about a week ago.  Not particularly hot or inflamed but is painful and just remains swollen.  He does not recall any trauma preceding the onset of the swelling.   Previous HPI 06/13/2021 Peter Mcmillan is a 67 y.o. 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 He missed  one does of methotrexate Mcmillan to running out of his medication and now has increased symptoms with bilateral hand pain and swelling. Right hand is worst affected and difficult to use normally. He also has a new cough ongoing for the past month it is not productive. Cough is most common when talking, when sitting and eating, or after he lies down in bed to sleep. He is not yet taking any seasonal allergy medication for the year. He is not taking any cough medicines.   Previous HPI 05/04/21 Peter Mcmillan is a 67 y.o. 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 weekly.  He stopped taking the Orencia injections for about a month ever since he was hospitalized for his A. fib.  He feels there was not much difference when he took the shots and has not noticed much difference since stopping them.  He continues having some joint pain stiffness and swelling mostly affecting his hands and knees.  He is curious about any other anti-inflammatory medicine or supplements to try in addition to his RA medicines.   Previous HPI 09/23/20 Peter Mcmillan is a 67 y.o. male here for follow-up of seropositive rheumatoid arthritis on methotrexate 15 mg p.o. weekly and prednisone 20 mg daily.  He feels his symptoms  have improved taking the medications although worsened greatly when he decrease his prednisone down to 10 mg as directed and so went back to taking 20 daily.  He has been out of the methotrexate since about 2 weeks ago.   Review of Systems  Constitutional:  Positive for fatigue.  HENT:  Negative for mouth sores and mouth dryness.   Eyes:  Negative for dryness.  Respiratory:  Positive for shortness of breath.   Cardiovascular:  Negative for chest pain and palpitations.  Gastrointestinal:  Negative for blood in stool, constipation and diarrhea.  Endocrine: Negative for increased urination.  Genitourinary:  Negative for involuntary urination.  Musculoskeletal:  Positive for joint  pain, joint pain, joint swelling, myalgias, muscle weakness, morning stiffness, muscle tenderness and myalgias. Negative for gait problem.  Skin:  Negative for color change, rash, hair loss and sensitivity to sunlight.  Allergic/Immunologic: Negative for susceptible to infections.  Neurological:  Negative for dizziness and headaches.  Hematological:  Negative for swollen glands.  Psychiatric/Behavioral:  Negative for depressed mood and sleep disturbance. The patient is not nervous/anxious.     PMFS History:  Patient Active Problem List   Diagnosis Date Noted   Secondary hypercoagulable state 04/05/2021   Atypical atrial flutter 04/05/2021   Atrial fibrillation 12/17/2020   Rapid atrial fibrillation 11/01/2020   Atrial flutter with rapid ventricular response 10/05/2020   Seropositive rheumatoid arthritis 07/15/2020   High risk medication use 07/15/2020   Allergic rhinitis Mcmillan to pollen 06/11/2020   ED (erectile dysfunction) of organic origin 06/11/2020   Essential hypertension 06/11/2020   Gastro-esophageal reflux disease without esophagitis 06/11/2020   Hyperlipidemia 06/11/2020   Malignant melanoma of trunk 06/11/2020   Prediabetes 06/11/2020   Tobacco user 06/11/2020   Vitamin D deficiency 06/11/2020   Bilateral hand pain 06/11/2020   Bilateral shoulder pain 06/11/2020   Atrial flutter 12/09/2013   PULMONARY NODULE 08/18/2008   COUGH 08/18/2008    Past Medical History:  Diagnosis Date   Atrial flutter    Bursitis of right elbow    Erectile dysfunction    Rheumatoid arthritis    Tobacco abuse    Smokeless tobacco    Family History  Problem Relation Age of Onset   Heart attack Father    Heart attack Mother    Dementia Mother    Healthy Sister    Healthy Son    Past Surgical History:  Procedure Laterality Date   ATRIAL FIBRILLATION ABLATION N/A 12/17/2020   Procedure: ATRIAL FIBRILLATION ABLATION;  Surgeon: Lanier Prude, MD;  Location: [facility];   Service: Cardiovascular;  Laterality: N/A;   BACK SURGERY  1999   CARDIOVERSION N/A 10/06/2020   Procedure: CARDIOVERSION;  Surgeon: Little Ishikawa, MD;  Location: [facility];  Service: Cardiovascular;  Laterality: N/A;   MELANOMA EXCISION  2020   TEE WITHOUT CARDIOVERSION N/A 10/06/2020   Procedure: TRANSESOPHAGEAL ECHOCARDIOGRAM (TEE);  Surgeon: Little Ishikawa, MD;  Location: [facility] ENDOSCOPY;  Service: Cardiovascular;  Laterality: N/A;   TEE WITHOUT CARDIOVERSION N/A 12/17/2020   Procedure: TRANSESOPHAGEAL ECHOCARDIOGRAM (TEE);  Surgeon: Sande Rives, MD;  Location: [facility];  Service: Cardiovascular;  Laterality: N/A;   Social History   Social History Narrative   Not on file   Immunization History  Administered Date(s) Administered   Hep A / Hep B 02/17/2011   Influenza Split 01/29/2017   Moderna Sars-Covid-2 Vaccination 07/09/2019, 07/09/2019, 08/06/2019, 08/06/2019   Pneumococcal Polysaccharide-23 02/12/2009   Tdap 02/12/2009, 06/13/2017, 06/18/2020  Objective: Vital Signs: BP 129/86 (BP Location: Left Arm, Patient Position: Sitting, Cuff Size: Normal)   Pulse 80   Resp 16   Ht 6\' 5"  (1.956 m)   Wt 225 lb (102.1 kg)   BMI 26.68 kg/m    Physical Exam Cardiovascular:     Rate and Rhythm: Normal rate and regular rhythm.  Pulmonary:     Effort: Pulmonary effort is normal.     Breath sounds: Normal breath sounds.  Musculoskeletal:     Right lower leg: No edema.     Left lower leg: No edema.  Skin:    General: Skin is warm and dry.     Findings: No rash.  Neurological:     Mental Status: He is alert.  Psychiatric:        Mood and Affect: Mood normal.      Musculoskeletal Exam:  Bilateral shoulder pain with overhead abduction and reaching behind back, right shoulder with tenderness to pressure no palpable swelling Elbows full ROM no tenderness or swelling Mild right wrist swelling and some tenderness to pressure with range of  motion intact Right second and third MCP joint swelling and tenderness, there is slight MCP joint subluxation and lateral deviation incompletely reducible throughout right MCPs, good grip range of motion bilaterally Knees full ROM tenderness to pressure most severe posteriorly, no nodules or palpable effusions   CDAI Exam: CDAI Score: 17  Patient Global: 50 mm; Provider Global: 30 mm Swollen: 3 ; Tender: 6  Joint Exam 07/24/2022      Right  Left  Glenohumeral   Tender     Wrist  Swollen Tender     MCP 2  Swollen Tender     MCP 3  Swollen Tender     Knee   Tender   Tender     Investigation: No additional findings.  Imaging: No results found.  Recent Labs: Lab Results  Component Value Date   WBC 6.9 09/16/2021   HGB 15.8 09/16/2021   PLT 273 09/16/2021   NA 137 07/03/2022   K 4.0 07/03/2022   CL 103 07/03/2022   CO2 22 07/03/2022   GLUCOSE 87 07/03/2022   BUN 13 07/03/2022   CREATININE 1.09 07/03/2022   BILITOT 0.5 09/16/2021   ALKPHOS 44 12/28/2020   AST 15 09/16/2021   ALT 13 09/16/2021   PROT 6.4 09/16/2021   ALBUMIN 3.8 12/28/2020   CALCIUM 8.6 (L) 07/03/2022   GFRAA 111 09/23/2020   QFTBGOLDPLUS NEGATIVE 10/20/2020    Speciality Comments: No specialty comments available.  Procedures:  No procedures performed Allergies: Codeine, Other, Penicillin g, and Penicillins   Assessment / Plan:     Visit Diagnoses: Seropositive rheumatoid arthritis - Plan: methotrexate (RHEUMATREX) 2.5 MG tablet, folic acid (FOLVITE) 1 MG tablet, Sedimentation rate  High disease activity flareup appears to be precipitated by unplanned stopping of methotrexate Mcmillan to lack of clinic follow-up.  Checking sedimentation rate for disease activity monitoring.  Prior to that he was doing very well and not sure whether he needed to be taking the medicine regularly.  Plan to resume methotrexate at 25 mg p.o. weekly with folic acid 1 mg daily.  On prednisone taper has additional 9 days  planned recommend to contact us again if flares back up after completing this may need more prolonged treatment with methotrexate restart.  High risk medication use - Plan: CBC with Differential/Platelet, COMPLETE METABOLIC PANEL WITH GFR  Checking CBC and CMP for medication monitoring baseline off of  methotrexate for about 2 months or longer.  Previously tolerated without issue.  Orders: Orders Placed This Encounter  Procedures   Sedimentation rate   CBC with Differential/Platelet   COMPLETE METABOLIC PANEL WITH GFR   Meds ordered this encounter  Medications   methotrexate (RHEUMATREX) 2.5 MG tablet    Sig: Take 10 tablets (25 mg total) by mouth once a week.    Dispense:  120 tablet    Refill:  0   folic acid (FOLVITE) 1 MG tablet    Sig: Take 1 tablet (1 mg total) by mouth daily.    Dispense:  90 tablet    Refill:  3     Follow-Up Instructions: Return in about 3 months (around 10/23/2022) for RA MTX restart/GC taper f/u 3mos.   Fuller Plan, MD  Note - This record has been created using [employer].  Chart creation errors have been sought, but may not always  have been located. Such creation errors do not reflect on  the standard of medical care.

## 2022-07-25 LAB — CBC WITH DIFFERENTIAL/PLATELET
Absolute Monocytes: 826 cells/uL (ref 200–950)
Basophils Absolute: 63 cells/uL (ref 0–200)
Basophils Relative: 0.9 %
Eosinophils Absolute: 161 cells/uL (ref 15–500)
Eosinophils Relative: 2.3 %
HCT: 46.4 % (ref 38.5–50.0)
Hemoglobin: 15.8 g/dL (ref 13.2–17.1)
Lymphs Abs: 1610 cells/uL (ref 850–3900)
MCH: 29.6 pg (ref 27.0–33.0)
MCHC: 34.1 g/dL (ref 32.0–36.0)
MCV: 86.9 fL (ref 80.0–100.0)
MPV: 9.8 fL (ref 7.5–12.5)
Monocytes Relative: 11.8 %
Neutro Abs: 4340 cells/uL (ref 1500–7800)
Neutrophils Relative %: 62 %
Platelets: 272 10*3/uL (ref 140–400)
RBC: 5.34 10*6/uL (ref 4.20–5.80)
RDW: 12.4 % (ref 11.0–15.0)
Total Lymphocyte: 23 %
WBC: 7 10*3/uL (ref 3.8–10.8)

## 2022-07-25 LAB — COMPLETE METABOLIC PANEL WITH GFR
AG Ratio: 1.4 (calc) (ref 1.0–2.5)
ALT: 17 U/L (ref 9–46)
AST: 14 U/L (ref 10–35)
Albumin: 3.9 g/dL (ref 3.6–5.1)
Alkaline phosphatase (APISO): 60 U/L (ref 35–144)
BUN: 13 mg/dL (ref 7–25)
CO2: 25 mmol/L (ref 20–32)
Calcium: 9.1 mg/dL (ref 8.6–10.3)
Chloride: 106 mmol/L (ref 98–110)
Creat: 0.92 mg/dL (ref 0.70–1.35)
Globulin: 2.7 g/dL (calc) (ref 1.9–3.7)
Glucose, Bld: 106 mg/dL — ABNORMAL HIGH (ref 65–99)
Potassium: 3.9 mmol/L (ref 3.5–5.3)
Sodium: 140 mmol/L (ref 135–146)
Total Bilirubin: 0.4 mg/dL (ref 0.2–1.2)
Total Protein: 6.6 g/dL (ref 6.1–8.1)
eGFR: 92 mL/min/{1.73_m2} (ref >=60)

## 2022-07-25 LAB — SEDIMENTATION RATE: Sed Rate: 19 mm/h (ref 0–20)

## 2022-07-26 ENCOUNTER — Telehealth: Payer: Self-pay | Admitting: *Deleted

## 2022-07-26 NOTE — Telephone Encounter (Signed)
Patient advised lab results are all normal. No problems for resuming methotrexate as planned. His sedimentation rate is normal but this is not surprising with him taking prednisone.   Patient states since starting the MTX yesterday he is having the worst pain he has had. He states that the increased pain started yesterday after taking MTX. Patient states it is worse today. Patient states he aches all over. Patient states knees, hands and shoulders are the worst. Patient is taking the prednisone as prescribed.

## 2022-07-26 NOTE — Telephone Encounter (Signed)
Lab results are all normal. No problems for resuming methotrexate as planned. His sedimentation rate is normal but this is not surprising with him taking prednisone.

## 2022-07-26 NOTE — Telephone Encounter (Signed)
Patient contacted the office requesting his lab results. Please review and advise.

## 2022-07-30 ENCOUNTER — Encounter: Payer: Self-pay | Admitting: Physician Assistant

## 2022-07-30 MED ORDER — PREDNISONE 5 MG PO TABS
ORAL_TABLET | ORAL | 0 refills | Status: DC
  Filled 2022-07-30: qty 40, fill #0

## 2022-07-30 MED ORDER — PREDNISONE 5 MG PO TABS: ORAL_TABLET | ORAL | 0 refills | Status: DC

## 2022-07-31 ENCOUNTER — Other Ambulatory Visit (HOSPITAL_COMMUNITY): Payer: Self-pay

## 2022-07-31 ENCOUNTER — Telehealth: Payer: Self-pay | Admitting: Physician Assistant

## 2022-07-31 NOTE — Telephone Encounter (Signed)
Late on-call entry:  I received a message from the on-call service yesterday, Sunday 07/30/22 at 10:48am.   I returned the patients call right away to discuss the flare he was experiencing.  Patient was last seen in the office on 07/24/2022 by Dr. Dimple Casey.  The patient had had a gap in therapy while taking methotrexate since he had noticed clinical improvement and discontinued.  His last dose of methotrexate was resumed on Tuesday.  He had not noticed any on clinical improvement yet.  He started to have a flare involving both hands, both knees, and both feet on Friday which progressed throughout the weekend.  Yesterday morning the patient took prednisone 5 mg 1 tablet but was experiencing significant breakthrough symptoms.  A refill of prednisone starting at 20 mg tapering by 5 mg every 4 days was sent to the pharmacy.  He was encouraged to take prednisone 20 mg yesterday and to follow the taper as prescribed.  I tried to send the prescription for prednisone through [facility] but according to the patient the pharmacy did not receive the prescription.  I called and gave a verbal order for the prescription prednisone taper.  I called the patient back to notify them that the prescription was sent.  Passed message along to Dr. Dimple Casey today.   Patient was advised to call the office this week if his symptoms persist or worsen.   Sherron Ales, PA-C

## 2022-08-07 ENCOUNTER — Telehealth: Payer: Self-pay

## 2022-08-07 NOTE — Telephone Encounter (Signed)
Attempted to contact the patient and left a message to call the office back if he would like to come in at the 2:40 time frame today.

## 2022-08-07 NOTE — Telephone Encounter (Signed)
Patient contacted the office and states the Prednisone is not working and he is still having severe pain and swelling in his hands and knees. Patient states he has been putting ice on the areas at night but it is not working. Patient states if possible he would like be seen today after one o'clock. Patient call back number is [phone]. Please advise.

## 2022-08-07 NOTE — Telephone Encounter (Signed)
There is now a schedule opening at 2:40 he could come today around that time.

## 2022-08-09 ENCOUNTER — Other Ambulatory Visit (HOSPITAL_COMMUNITY): Payer: Self-pay | Admitting: Physician Assistant

## 2022-08-21 ENCOUNTER — Encounter: Payer: Self-pay | Admitting: Internal Medicine

## 2022-08-21 ENCOUNTER — Ambulatory Visit: Payer: Commercial Managed Care - PPO | Attending: Internal Medicine | Admitting: Internal Medicine

## 2022-08-21 ENCOUNTER — Telehealth: Payer: Self-pay

## 2022-08-21 VITALS — BP 129/82 | HR 93 | Resp 16 | Ht 77.0 in | Wt 226.0 lb

## 2022-08-21 DIAGNOSIS — M79641 Pain in right hand: Secondary | ICD-10-CM | POA: Diagnosis not present

## 2022-08-21 DIAGNOSIS — M059 Rheumatoid arthritis with rheumatoid factor, unspecified: Secondary | ICD-10-CM | POA: Diagnosis not present

## 2022-08-21 DIAGNOSIS — Z79899 Other long term (current) drug therapy: Secondary | ICD-10-CM

## 2022-08-21 DIAGNOSIS — M79642 Pain in left hand: Secondary | ICD-10-CM | POA: Diagnosis not present

## 2022-08-21 MED ORDER — PREDNISONE 10 MG PO TABS: ORAL_TABLET | ORAL | 0 refills | Status: DC

## 2022-08-21 NOTE — Progress Notes (Signed)
Office Visit Note  Patient: Peter Mcmillan             Date of Birth: 06-27-1955           MRN: [id number]             PCP: Wilfrid Lund, PA Referring: Wilfrid Lund, [place] Visit Date: 08/21/2022   Subjective:  Follow-up (Patient states he is hurting a lot and the Prednisone helped but now that he is off the Prednisone the pain is back. Patient states he has had two lumps pop up on his elbows.)   History of Present Illness: Peter Mcmillan is a 67 y.o. male here for follow-up for seropositive RA after resuming methotrexate 25 mg p.o. weekly folic acid 1 mg daily and initial treatment with prednisone taper starting from 20 mg over 12 days.  Initially had benefit while taking the prednisone since stopping it he immediately saw an increase in joint pain affecting bilateral hands and feet again has also developed swelling overlying the elbows and on anterior of both knees.  Very prolonged stiffness on a daily basis beginning after waking lasting until around 3 to 4 PM.   07/24/22 Peter Mcmillan is a 67 y.o. male here for follow up for seropositive RA previously on methotrexate but has been out of the medication for about 2 months was taking 25 mg p.o. weekly he still on the folic acid 1 mg daily.  He has been experiencing increased joint pain and swelling in his hands shoulders and knees more severe on the right side than left.  Did not recall any other illness or injury prior to symptom worsening.  We spoke by phone 2 days ago and started on prednisone taper he is on 20 mg today is day 3 so far seeing a partial improvement joint pain and swelling is decreased but still has prolonged morning stiffness and still having some swelling in that right hand and shoulder.  He had recent cardiology follow-up everything is doing well and not in A-fib.   Previous HPI 09/16/21 Peter Mcmillan is a 67 y.o. 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   Hand pain is pretty stable without any major flareup or exacerbation.  His current complaint is due to right elbow swelling.  He developed painful bursitis this was evaluated at urgent care clinic last month and again about a week ago.  Not particularly hot or inflamed but is painful and just remains swollen.  He does not recall any trauma preceding the onset of the swelling.   Previous HPI 06/13/2021 Peter Mcmillan is a 67 y.o. 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 He missed one does of methotrexate due to running out of his medication and now has increased symptoms with bilateral hand pain and swelling. Right hand is worst affected and difficult to use normally. He also has a new cough ongoing for the past month it is not productive. Cough is most common when talking, when sitting and eating, or after he lies down in bed to sleep. He is not yet taking any seasonal allergy medication for the year. He is not taking any cough medicines.   Previous HPI 05/04/21 Peter Mcmillan is a 67 y.o. 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 weekly.  He stopped  taking the Orencia injections for about a month ever since he was hospitalized for his A. fib.  He feels there was not much difference when he took the shots and has not noticed much difference since stopping them.  He continues having some joint pain stiffness and swelling mostly affecting his hands and knees.  He is curious about any other anti-inflammatory medicine or supplements to try in addition to his RA medicines.   Previous HPI 09/23/20 Peter Mcmillan is a 67 y.o. male here for follow-up of seropositive rheumatoid arthritis on methotrexate 15 mg p.o. weekly and prednisone 20 mg daily.  He feels his symptoms have improved taking the medications although worsened greatly when he decrease his prednisone  down to 10 mg as directed and so went back to taking 20 daily.  He has been out of the methotrexate since about 2 weeks ago.  Activities of Daily Living:  Patient reports morning stiffness for 24 hours.   Patient Reports nocturnal pain.  Difficulty dressing/grooming: Reports Difficulty climbing stairs: Reports Difficulty getting out of chair: Reports Difficulty using hands for taps, buttons, cutlery, and/or writing: Reports  Review of Systems  Constitutional:  Positive for fatigue.  HENT:  Negative for mouth sores and mouth dryness.   Eyes:  Negative for dryness.  Respiratory:  Positive for shortness of breath.   Cardiovascular:  Negative for chest pain and palpitations.  Gastrointestinal:  Negative for blood in stool, constipation and diarrhea.  Endocrine: Negative for increased urination.  Genitourinary:  Negative for involuntary urination.  Musculoskeletal:  Positive for joint pain, gait problem, joint pain, joint swelling, myalgias, muscle weakness, morning stiffness, muscle tenderness and myalgias.  Skin:  Negative for color change, rash, hair loss and sensitivity to sunlight.  Allergic/Immunologic: Negative for susceptible to infections.  Neurological:  Negative for dizziness and headaches.  Hematological:  Positive for swollen glands.  Psychiatric/Behavioral:  Positive for sleep disturbance. Negative for depressed mood. The patient is not nervous/anxious.     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   Essential  hypertension 06/11/2020   Gastro-esophageal reflux disease without esophagitis 06/11/2020   Hyperlipidemia 06/11/2020   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   Prediabetes 06/11/2020   Tobacco user 06/11/2020   Vitamin D deficiency 06/11/2020   Bilateral hand pain 06/11/2020   Bilateral shoulder pain 06/11/2020   Atrial flutter (HCC) 12/09/2013   PULMONARY NODULE 08/18/2008   COUGH 08/18/2008    Past Medical History:  Diagnosis Date   Atrial flutter (HCC)    Bursitis of right elbow    Erectile dysfunction    Rheumatoid arthritis (HCC)    Tobacco abuse    Smokeless tobacco    Family History  Problem Relation Age of Onset   Heart attack Father    Heart attack Mother    Dementia Mother    Healthy Sister    Healthy Son    Past Surgical History:  Procedure Laterality Date   ATRIAL FIBRILLATION ABLATION N/A 12/17/2020   Procedure: ATRIAL FIBRILLATION ABLATION;  Surgeon: Lanier Prude, MD;  Location: [facility] INVASIVE  CV LAB;  Service: Cardiovascular;  Laterality: N/A;   BACK SURGERY  1999   CARDIOVERSION N/A 10/06/2020   Procedure: CARDIOVERSION;  Surgeon: Little Ishikawa, MD;  Location: [facility] ENDOSCOPY;  Service: Cardiovascular;  Laterality: N/A;   MELANOMA EXCISION  2020   TEE WITHOUT CARDIOVERSION N/A 10/06/2020   Procedure: TRANSESOPHAGEAL ECHOCARDIOGRAM (TEE);  Surgeon: Little Ishikawa, MD;  Location: [facility] ENDOSCOPY;  Service: Cardiovascular;  Laterality: N/A;   TEE WITHOUT CARDIOVERSION N/A 12/17/2020   Procedure: TRANSESOPHAGEAL ECHOCARDIOGRAM (TEE);  Surgeon: Sande Rives, MD;  Location: [facility] INVASIVE CV LAB;  Service: Cardiovascular;  Laterality: N/A;   Social History   Social History Narrative   Not on file   Immunization History  Administered Date(s) Administered   Hep A / Hep B 02/17/2011   Influenza Split 01/29/2017   Moderna Sars-Covid-2 Vaccination 07/09/2019, 07/09/2019, 08/06/2019, 08/06/2019   Pneumococcal Polysaccharide-23 02/12/2009    Tdap 02/12/2009, 06/13/2017, 06/18/2020     Objective: Vital Signs: BP 129/82 (BP Location: Left Arm, Patient Position: Sitting, Cuff Size: Normal)   Pulse 93   Resp 16   Ht 6\' 5"  (1.956 m)   Wt 226 lb (102.5 kg)   BMI 26.80 kg/m    Physical Exam Eyes:     Conjunctiva/sclera: Conjunctivae normal.  Cardiovascular:     Rate and Rhythm: Normal rate and regular rhythm.  Pulmonary:     Effort: Pulmonary effort is normal.     Breath sounds: Normal breath sounds.  Musculoskeletal:     Right lower leg: No edema.     Left lower leg: No edema.  Lymphadenopathy:     Cervical: No cervical adenopathy.  Skin:    General: Skin is warm and dry.     Findings: No rash.  Neurological:     Mental Status: He is alert.  Psychiatric:        Mood and Affect: Mood normal.      Musculoskeletal Exam:  Shoulders full ROM no tenderness or swelling Elbows full ROM some tenderness to pressure at lateral and medial epicondyles on both sides, left olecranon bursitis Wrist tenderness to pressure both sides with no palpable effusion MCP and PIP joint tenderness with slightly decreased flexion range of motion, lateral deviation is reducible and right hand fingers, no particular synovitis palpable Knees full ROM, joint line tenderness to pressure on both sides, swelling present in the prepatellar bursa but no tenderness   CDAI Exam: CDAI Score: 32  Patient Global: 70 mm; Provider Global: 50 mm Swollen: 3 ; Tender: 17  Joint Exam 08/21/2022      Right  Left  Elbow     Swollen Tender  Wrist   Tender   Tender  MCP 2   Tender   Tender  MCP 3   Tender   Tender  MCP 4   Tender     MCP 5   Tender     PIP 2   Tender   Tender  PIP 3   Tender   Tender  PIP 4   Tender     PIP 5   Tender     Knee  Swollen Tender  Swollen Tender     Investigation: No additional findings.  Imaging: No results found.  Recent Labs: Lab Results  Component Value Date   WBC 7.0 07/24/2022   HGB 15.8 07/24/2022    PLT 272 07/24/2022   NA 140 07/24/2022   K 3.9 07/24/2022   CL 106 07/24/2022   CO2 25  07/24/2022   GLUCOSE 106 (H) 07/24/2022   BUN 13 07/24/2022   CREATININE 0.92 07/24/2022   BILITOT 0.4 07/24/2022   ALKPHOS 44 12/28/2020   AST 14 07/24/2022   ALT 17 07/24/2022   PROT 6.6 07/24/2022   ALBUMIN 3.8 12/28/2020   CALCIUM 9.1 07/24/2022   GFRAA 111 09/23/2020   QFTBGOLDPLUS NEGATIVE 10/20/2020    Speciality Comments: No specialty comments available.  Procedures:  No procedures performed Allergies: Codeine, Other, Penicillin g, and Penicillins   Assessment / Plan:     Visit Diagnoses: Seropositive rheumatoid arthritis (HCC) - Plan: predniSONE (DELTASONE) 10 MG tablet, Sedimentation rate, C-reactive protein  Extensive joint pain though many sites with tenderness without palpable synovitis on exam today.  Will recheck sed rate and CRP for disease activity assessment though he has had normal results despite definite inflammation on exam in the past.  Plan to continue methotrexate 25 mg p.o. weekly folic acid 1 mg daily.  Will start on prednisone taper initial 20 mg daily decrease to 10 mg and stay on this until follow-up or starting the medication.  Previous lack of response to Orencia and avoiding TNF inhibitors with personal history of melanoma.  A-fib and cardiovascular risk factor not ideal for Jak inhibitor.  Could consider tocilizumab as a treatment option.  High risk medication use - Plan: CBC with Differential/Platelet, COMPLETE METABOLIC PANEL WITH GFR, QuantiFERON-TB Gold Plus  Rechecking CBC and CMP for medication monitoring now after resuming methotrexate.  Will also check QuantiFERON baseline for tuberculosis screening in case needing to add biologic DMARD and while he is off of steroids.  Orders: Orders Placed This Encounter  Procedures   Sedimentation rate   C-reactive protein   CBC with Differential/Platelet   COMPLETE METABOLIC PANEL WITH GFR   QuantiFERON-TB  Gold Plus   Meds ordered this encounter  Medications   predniSONE (DELTASONE) 10 MG tablet    Sig: Take 2 tablets (20 mg total) by mouth daily with breakfast for 7 days, THEN 1 tablet (10 mg total) daily with breakfast. Take 4 tabs po x 4 days, 3  tabs po x 4 days, 2  tabs po x 4 days, 1  tab po x 4 days.    Dispense:  44 tablet    Refill:  0     Follow-Up Instructions: Return in about 3 months (around 11/21/2022) for RA on MTX/GC f/u 3mos.   Fuller Plan, MD  Note - This record has been created using [employer].  Chart creation errors have been sought, but may not always  have been located. Such creation errors do not reflect on  the standard of medical care.

## 2022-08-21 NOTE — Addendum Note (Signed)
Addended by: Fuller Plan on: 08/21/2022 04:53 PM   Modules accepted: Orders

## 2022-08-21 NOTE — Telephone Encounter (Signed)
Thayer Ohm from [facility] contacted the office to get clarification on the Prednisone directions for the patient. [employer] Dr. Dimple Casey sent in a new prescription with the correct directions. Thayer Ohm states he will be on the look out for the new prescription.

## 2022-08-23 LAB — COMPLETE METABOLIC PANEL WITH GFR
AG Ratio: 1.5 (calc) (ref 1.0–2.5)
ALT: 16 U/L (ref 9–46)
AST: 15 U/L (ref 10–35)
Albumin: 4.1 g/dL (ref 3.6–5.1)
Alkaline phosphatase (APISO): 55 U/L (ref 35–144)
BUN: 14 mg/dL (ref 7–25)
CO2: 26 mmol/L (ref 20–32)
Calcium: 9.3 mg/dL (ref 8.6–10.3)
Chloride: 104 mmol/L (ref 98–110)
Creat: 1.04 mg/dL (ref 0.70–1.35)
Globulin: 2.8 g/dL (calc) (ref 1.9–3.7)
Glucose, Bld: 103 mg/dL — ABNORMAL HIGH (ref 65–99)
Potassium: 4.5 mmol/L (ref 3.5–5.3)
Sodium: 139 mmol/L (ref 135–146)
Total Bilirubin: 0.4 mg/dL (ref 0.2–1.2)
Total Protein: 6.9 g/dL (ref 6.1–8.1)
eGFR: 79 mL/min/{1.73_m2} (ref >=60)

## 2022-08-23 LAB — CBC WITH DIFFERENTIAL/PLATELET
Absolute Monocytes: 878 cells/uL (ref 200–950)
Basophils Absolute: 54 cells/uL (ref 0–200)
Basophils Relative: 0.8 %
Eosinophils Absolute: 221 cells/uL (ref 15–500)
Eosinophils Relative: 3.3 %
HCT: 46.7 % (ref 38.5–50.0)
Hemoglobin: 15.9 g/dL (ref 13.2–17.1)
Lymphs Abs: 1467 cells/uL (ref 850–3900)
MCH: 30.2 pg (ref 27.0–33.0)
MCHC: 34 g/dL (ref 32.0–36.0)
MCV: 88.6 fL (ref 80.0–100.0)
MPV: 9.7 fL (ref 7.5–12.5)
Monocytes Relative: 13.1 %
Neutro Abs: 4080 cells/uL (ref 1500–7800)
Neutrophils Relative %: 60.9 %
Platelets: 278 10*3/uL (ref 140–400)
RBC: 5.27 10*6/uL (ref 4.20–5.80)
RDW: 12.9 % (ref 11.0–15.0)
Total Lymphocyte: 21.9 %
WBC: 6.7 10*3/uL (ref 3.8–10.8)

## 2022-08-23 LAB — QUANTIFERON-TB GOLD PLUS
Mitogen-NIL: 8.81 IU/mL
NIL: 0.02 IU/mL
QuantiFERON-TB Gold Plus: NEGATIVE
TB1-NIL: 0 IU/mL
TB2-NIL: 0 IU/mL

## 2022-08-23 LAB — C-REACTIVE PROTEIN: CRP: 19.7 mg/L — ABNORMAL HIGH (ref <8.0)

## 2022-08-23 LAB — SEDIMENTATION RATE: Sed Rate: 25 mm/h — ABNORMAL HIGH (ref 0–20)

## 2022-09-25 ENCOUNTER — Telehealth: Payer: Self-pay | Admitting: Internal Medicine

## 2022-09-25 DIAGNOSIS — M059 Rheumatoid arthritis with rheumatoid factor, unspecified: Secondary | ICD-10-CM

## 2022-09-25 MED ORDER — METHOTREXATE SODIUM 2.5 MG PO TABS: 25.0000 mg | ORAL_TABLET | ORAL | 0 refills | Status: DC

## 2022-09-25 MED ORDER — PREDNISONE 10 MG PO TABS: 10.0000 mg | ORAL_TABLET | Freq: Every day | ORAL | 1 refills | Status: DC

## 2022-09-25 NOTE — Telephone Encounter (Signed)
Patient was last seen on 08/21/2022. Patient has been prescribed Folic acid, methotrexate, and prednisone. Please advise.

## 2022-09-25 NOTE — Telephone Encounter (Signed)
Attempted to contact the patient and left a message to call the office back. 

## 2022-09-25 NOTE — Telephone Encounter (Signed)
Advised patient that per Dr. Dimple Casey:  Sent new Rx for prednisone 10 mg daily. He can continue this till next visit. If feeling well he can try to decrease to 1/2 tablet as tolerated. Also sent refill for methotrexate Rx (should not be due till July).  Patient verbalized understanding.

## 2022-09-25 NOTE — Telephone Encounter (Signed)
Pt called stating having trouble moving both hands and knees. Pt is requesting prednisone to help before his next appt 11/22/22.

## 2022-09-25 NOTE — Telephone Encounter (Signed)
Sent new Rx for prednisone 10 mg daily. He can continue this till next visit. If feeling well he can try to decrease to 1/2 tablet as tolerated. Also sent refill for methotrexate Rx (should not be due till July).

## 2022-09-25 NOTE — Addendum Note (Signed)
Addended by: Fuller Plan on: 09/25/2022 12:24 PM   Modules accepted: Orders

## 2022-10-09 ENCOUNTER — Other Ambulatory Visit (HOSPITAL_COMMUNITY): Payer: Self-pay | Admitting: Physician Assistant

## 2022-10-09 DIAGNOSIS — I4819 Other persistent atrial fibrillation: Secondary | ICD-10-CM

## 2022-10-25 ENCOUNTER — Ambulatory Visit: Payer: Commercial Managed Care - PPO | Admitting: Internal Medicine

## 2022-11-09 NOTE — Progress Notes (Signed)
Office Visit Note  Patient: Peter Mcmillan             Date of Birth: 1955-07-05           MRN: [id number]             PCP: Wilfrid Lund, PA Referring: Wilfrid Lund, [place] Visit Date: 11/22/2022   Subjective:  Follow-up (Patient states he has a cough that he cannot get rid of that she has had for a year and a half. Patient states he has a lump on each elbow he would like drained. )   History of Present Illness: Peter Mcmillan is a 67 y.o. male here for follow up for seropositive RA on methotrexate 25 mg p.o. weekly folic acid 1 mg daily and prednisone 10 mg daily unfortunately still having significant joint pain and stiffness on a daily basis.  Currently his biggest complaint is with the recurrence of elbow pain and swelling with a very large lump especially on the left side.  This is bothersome because it is crating a pressure area and hitting against things as well as pain in the joint and difficulty fully extending his elbow.   Previous HPI 08/21/2022 Peter Mcmillan is a 67 y.o. male here for follow-up for seropositive RA after resuming methotrexate 25 mg p.o. weekly folic acid 1 mg daily and initial treatment with prednisone taper starting from 20 mg over 12 days.  Initially had benefit while taking the prednisone since stopping it he immediately saw an increase in joint pain affecting bilateral hands and feet again has also developed swelling overlying the elbows and on anterior of both knees.  Very prolonged stiffness on a daily basis beginning after waking lasting until around 3 to 4 PM.   07/24/22 Peter Mcmillan is a 67 y.o. male here for follow up for seropositive RA previously on methotrexate but has been out of the medication for about 2 months was taking 25 mg p.o. weekly he still on the folic acid 1 mg daily.  He has been experiencing increased joint pain and swelling in his hands shoulders and knees more severe on the right side than left.  Did not  recall any other illness or injury prior to symptom worsening.  We spoke by phone 2 days ago and started on prednisone taper he is on 20 mg today is day 3 so far seeing a partial improvement joint pain and swelling is decreased but still has prolonged morning stiffness and still having some swelling in that right hand and shoulder.  He had recent cardiology follow-up everything is doing well and not in A-fib.   Previous HPI 09/16/21 Peter Mcmillan is a 67 y.o. 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  Hand pain is pretty stable without any major flareup or exacerbation.  His current complaint is due to right elbow swelling.  He developed painful bursitis this was evaluated at urgent care clinic last month and again about a week ago.  Not particularly hot or inflamed but is painful and just remains swollen.  He does not recall any trauma preceding the onset of the swelling.   Previous HPI 06/13/2021 Peter Mcmillan is a 67 y.o. 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 He missed one does of methotrexate due to running out of his medication and now  has increased symptoms with bilateral hand pain and swelling. Right hand is worst affected and difficult to use normally. He also has a new cough ongoing for the past month it is not productive. Cough is most common when talking, when sitting and eating, or after he lies down in bed to sleep. He is not yet taking any seasonal allergy medication for the year. He is not taking any cough medicines.   Previous HPI 05/04/21 Peter Mcmillan is a 67 y.o. 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 weekly.  He stopped taking the Orencia injections for about a month ever since he was hospitalized for his A. fib.  He feels there was not much difference when he took the shots and has not  noticed much difference since stopping them.  He continues having some joint pain stiffness and swelling mostly affecting his hands and knees.  He is curious about any other anti-inflammatory medicine or supplements to try in addition to his RA medicines.   Previous HPI 09/23/20 Peter Mcmillan is a 67 y.o. male here for follow-up of seropositive rheumatoid arthritis on methotrexate 15 mg p.o. weekly and prednisone 20 mg daily.  He feels his symptoms have improved taking the medications although worsened greatly when he decrease his prednisone down to 10 mg as directed and so went back to taking 20 daily.  He has been out of the methotrexate since about 2 weeks ago.   Activities of Daily Living:  Patient reports morning stiffness for 24 hours.   Patient Reports nocturnal pain.  Difficulty dressing/grooming: Reports Difficulty climbing stairs: Reports Difficulty getting out of chair: Reports Difficulty using hands for taps, buttons, cutlery, and/or writing: Reports   Review of Systems  Constitutional:  Negative for fatigue.  HENT:  Negative for mouth sores and mouth dryness.   Eyes:  Negative for dryness.  Respiratory:  Positive for shortness of breath.   Cardiovascular:  Negative for chest pain and palpitations.  Gastrointestinal:  Negative for blood in stool, constipation and diarrhea.  Endocrine: Negative for increased urination.  Genitourinary:  Negative for involuntary urination.  Musculoskeletal:  Positive for joint swelling, myalgias, morning stiffness, muscle tenderness and myalgias. Negative for joint pain, gait problem, joint pain and muscle weakness.  Skin:  Negative for color change, rash, hair loss and sensitivity to sunlight.  Allergic/Immunologic: Negative for susceptible to infections.  Neurological:  Positive for dizziness and headaches.  Hematological:  Negative for swollen glands.  Psychiatric/Behavioral:  Negative for depressed mood and sleep disturbance. The patient  is not nervous/anxious.     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   Essential hypertension 06/11/2020   Gastro-esophageal reflux disease without esophagitis 06/11/2020   Hyperlipidemia 06/11/2020   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   Prediabetes 06/11/2020   Tobacco user 06/11/2020   Vitamin D deficiency 06/11/2020   Bilateral hand pain 06/11/2020   Bilateral shoulder pain 06/11/2020   Atrial flutter (HCC) 12/09/2013   PULMONARY NODULE 08/18/2008   COUGH 08/18/2008    Past Medical History:  Diagnosis Date   Atrial flutter (HCC)    Bursitis of right elbow    Erectile dysfunction    Rheumatoid arthritis (HCC)    Tobacco abuse    Smokeless tobacco    Family History  Problem Relation Age of Onset   Heart attack Father    Heart attack Mother    Dementia Mother    Healthy Sister    Healthy Son    Past Surgical History:  Procedure Laterality Date   ATRIAL FIBRILLATION ABLATION N/A 12/17/2020   Procedure: ATRIAL FIBRILLATION ABLATION;  Surgeon: Lanier Prude, MD;  Location: [facility] INVASIVE [facility] LAB;  Service: Cardiovascular;  Laterality: N/A;   BACK SURGERY  1999   CARDIOVERSION N/A 10/06/2020   Procedure: CARDIOVERSION;  Surgeon: Little Ishikawa, MD;  Location: [facility] ENDOSCOPY;  Service: Cardiovascular;  Laterality: N/A;   MELANOMA EXCISION  2020   TEE WITHOUT CARDIOVERSION N/A 10/06/2020   Procedure: TRANSESOPHAGEAL ECHOCARDIOGRAM (TEE);  Surgeon: Little Ishikawa, MD;  Location: [facility] ENDOSCOPY;   Service: Cardiovascular;  Laterality: N/A;   TEE WITHOUT CARDIOVERSION N/A 12/17/2020   Procedure: TRANSESOPHAGEAL ECHOCARDIOGRAM (TEE);  Surgeon: Sande Rives, MD;  Location: [facility] INVASIVE CV LAB;  Service: Cardiovascular;  Laterality: N/A;   Social History   Social History Narrative   Not on file   Immunization History  Administered Date(s) Administered   Hep A / Hep B 02/17/2011   Influenza Split 01/29/2017   Moderna Sars-Covid-2 Vaccination 07/09/2019, 07/09/2019, 08/06/2019, 08/06/2019   Pneumococcal Polysaccharide-23 02/12/2009   Tdap 02/12/2009, 06/13/2017, 06/18/2020     Objective: Vital Signs: BP 129/84 (BP Location: Left Arm, Patient Position: Sitting, Cuff Size: Normal)   Pulse 81   Resp 16   Ht 6\' 5"  (1.956 m)   Wt 234 lb (106.1 kg)   BMI 27.75 kg/m    Physical Exam Cardiovascular:     Rate and Rhythm: Normal rate and regular rhythm.  Pulmonary:     Effort: Pulmonary effort is normal.     Breath sounds: Normal breath sounds.  Lymphadenopathy:     Cervical: No cervical adenopathy.  Skin:    General: Skin is warm and dry.  Neurological:     Mental Status: He is alert.  Psychiatric:        Mood and Affect: Mood normal.      Musculoskeletal Exam:  Bilateral shoulder pain with overhead abduction, right shoulder with tenderness to pressure no palpable swelling Elbows large effusion of left olecranon bursa, minimally tender, slightly limited extension range of motion, right elbow with small amount of fluid accumulated in bursa no redness or warmth Mild right wrist swelling and some tenderness to pressure, ROM intact Right second and third MCP joint swelling and tenderness, there is slight MCP joint subluxation and lateral deviation incompletely reducible throughout right MCPs, good grip range of motion bilaterally Knees full ROM tenderness to pressure most severe posteriorly, no nodules or palpable effusions  Investigation: No additional  findings.  Imaging: No results found.  Recent Labs: Lab Results  Component Value Date   WBC 4.9 11/22/2022   HGB 14.6 11/22/2022   PLT 281 11/22/2022   NA 140 11/22/2022   K 4.3 11/22/2022   CL 107 11/22/2022   CO2 25 11/22/2022   GLUCOSE 106 (H) 11/22/2022   BUN 12 11/22/2022   CREATININE 1.05 11/22/2022   BILITOT 0.4 11/22/2022   ALKPHOS 44 12/28/2020   AST 20 11/22/2022   ALT 23 11/22/2022   PROT 6.3 11/22/2022  ALBUMIN 3.8 12/28/2020   CALCIUM 9.0 11/22/2022   GFRAA 111 09/23/2020   QFTBGOLDPLUS NEGATIVE 08/21/2022    Speciality Comments: No specialty comments available.  Procedures:  Medium Joint Inj: L olecranon bursa on 11/22/2022 4:00 PM Indications: pain, joint swelling and diagnostic evaluation Details: 22 G 1.5 in needle, posterior approach Medications: 1 mL lidocaine 1 %; 40 mg triamcinolone acetonide 40 MG/ML Aspirate: serous; sent for lab analysis Outcome: tolerated well, no immediate complications Procedure, treatment alternatives, risks and benefits explained, specific risks discussed. Consent was given by the patient. Immediately prior to procedure a time out was called to verify the correct patient, procedure, equipment, support staff and site/side marked as required. Patient was prepped and draped in the usual sterile fashion.     Allergies: Codeine, Other, Penicillin g, and Penicillins   Assessment / Plan:     Visit Diagnoses: Seropositive rheumatoid arthritis (HCC) - Previous lack of response to Orencia and avoiding TNF inhibitors with personal history of melanoma. - Plan: Sedimentation rate, C-reactive protein, methotrexate (RHEUMATREX) 2.5 MG tablet, predniSONE (DELTASONE) 10 MG tablet, predniSONE (DELTASONE) 10 MG tablet  Still appears to be in moderate disease activity on this current regimen.  Rechecking sed rate and CRP for assessment.  For now continue on current regimen methotrexate 25 mg p.o. weekly folic acid 1 mg daily prednisone 10 mg  daily.  Will repeat a steroid taper again for now next 2 weeks before back on baseline regimen for now. If low enough risk by derm assessment could certainly benefit to try alternative bDMARD.  High risk medication use - methotrexate 25 mg p.o. weekly folic acid 1 mg daily. - Plan: CBC with Differential/Platelet, COMPLETE METABOLIC PANEL WITH GFR  Checking CBC and CMP for medication monitoring continue long-term use of methotrexate.  No serious interval infections.  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HCC)  Discussed with his history of melanoma this has been the main limiting factor against recommending biologic DMARD but has continued disease activity despite maximum titration of methotrexate and a moderate dose of prednisone.  He has follow-up appointment planned with his dermatologist coming up I recommend specific concern for risk with inhibitor or biologic such as Actemra.  Olecranon bursitis of left elbow - Plan: Synovial Fluid Analysis, Complete, Anaerobic and Aerobic Culture  Aspiration and injection of left olecranon bursa today we will repeat the synovial fluid analysis and rule out any infection or crystalline arthropathy refractory symptoms.  Orders: Orders Placed This Encounter  Procedures   Medium Joint Inj: L olecranon bursa   Anaerobic and Aerobic Culture   Sedimentation rate   C-reactive protein   CBC with Differential/Platelet   COMPLETE METABOLIC PANEL WITH GFR   Synovial Fluid Analysis, Complete   Meds ordered this encounter  Medications   methotrexate (RHEUMATREX) 2.5 MG tablet    Sig: Take 10 tablets (25 mg total) by mouth once a week.    Dispense:  120 tablet    Refill:  0   predniSONE (DELTASONE) 10 MG tablet    Sig: Take 1 tablet (10 mg total) by mouth daily with breakfast.    Dispense:  30 tablet    Refill:  2   predniSONE (DELTASONE) 10 MG tablet    Sig: Take 4 tablets (40 mg total) by mouth daily with breakfast for 3 days, THEN 3 tablets (30 mg total) daily  with breakfast for 3 days, THEN 2 tablets (20 mg total) daily with breakfast for 3 days.    Dispense:  27 tablet  Refill:  0     Follow-Up Instructions: Return in about 3 months (around 02/22/2023) for Ra on MTX/GC/inj f/u 3mos.   Fuller Plan, MD  Note - This record has been created using [employer].  Chart creation errors have been sought, but may not always  have been located. Such creation errors do not reflect on  the standard of medical care.

## 2022-11-13 ENCOUNTER — Other Ambulatory Visit: Payer: Self-pay | Admitting: Internal Medicine

## 2022-11-13 DIAGNOSIS — M059 Rheumatoid arthritis with rheumatoid factor, unspecified: Secondary | ICD-10-CM

## 2022-11-13 NOTE — Telephone Encounter (Signed)
Last Fill: 09/25/2022  Next Visit: 11/22/2022  Last Visit: 08/21/2022  Dx: Seropositive rheumatoid arthritis   Current Dose per office note on 08/21/2022: Will start on prednisone taper initial 20 mg daily decrease to 10 mg and stay on this until follow-up or starting the medication.   Patient next appointment is on 08/21 and last fill should be good until 08/24.   Okay to refill Prednisone?

## 2022-11-15 ENCOUNTER — Other Ambulatory Visit: Payer: Self-pay | Admitting: Internal Medicine

## 2022-11-15 DIAGNOSIS — M059 Rheumatoid arthritis with rheumatoid factor, unspecified: Secondary | ICD-10-CM

## 2022-11-22 ENCOUNTER — Other Ambulatory Visit: Payer: Self-pay | Admitting: Internal Medicine

## 2022-11-22 ENCOUNTER — Ambulatory Visit: Payer: Commercial Managed Care - PPO | Attending: Internal Medicine | Admitting: Internal Medicine

## 2022-11-22 ENCOUNTER — Encounter: Payer: Self-pay | Admitting: Internal Medicine

## 2022-11-22 VITALS — BP 129/84 | HR 81 | Resp 16 | Ht 77.0 in | Wt 234.0 lb

## 2022-11-22 DIAGNOSIS — M059 Rheumatoid arthritis with rheumatoid factor, unspecified: Secondary | ICD-10-CM

## 2022-11-22 DIAGNOSIS — Z7952 Long term (current) use of systemic steroids: Secondary | ICD-10-CM

## 2022-11-22 DIAGNOSIS — C4359 Malignant melanoma of other part of trunk: Secondary | ICD-10-CM | POA: Diagnosis not present

## 2022-11-22 DIAGNOSIS — Z79899 Other long term (current) drug therapy: Secondary | ICD-10-CM | POA: Diagnosis not present

## 2022-11-22 DIAGNOSIS — M7022 Olecranon bursitis, left elbow: Secondary | ICD-10-CM | POA: Diagnosis not present

## 2022-11-22 MED ORDER — PREDNISONE 10 MG PO TABS: 10.0000 mg | ORAL_TABLET | Freq: Every day | ORAL | 2 refills | Status: DC

## 2022-11-22 MED ORDER — METHOTREXATE SODIUM 2.5 MG PO TABS: 25.0000 mg | ORAL_TABLET | ORAL | 0 refills | Status: DC

## 2022-11-22 MED ORDER — PREDNISONE 10 MG PO TABS
ORAL_TABLET | ORAL | 0 refills | Status: AC
Start: 2022-11-23 — End: 2022-12-02

## 2022-11-22 NOTE — Patient Instructions (Signed)
Please ask your dermatologist about a medication called Humira. This is another injectable rheumatoid arthritis medication we could use but it can increase the risk of some types of skin cancer.

## 2022-11-23 LAB — CBC WITH DIFFERENTIAL/PLATELET
Absolute Monocytes: 745 {cells}/uL (ref 200–950)
Basophils Absolute: 49 {cells}/uL (ref 0–200)
Basophils Relative: 1 %
Eosinophils Absolute: 176 {cells}/uL (ref 15–500)
Eosinophils Relative: 3.6 %
HCT: 43.7 % (ref 38.5–50.0)
Hemoglobin: 14.6 g/dL (ref 13.2–17.1)
Lymphs Abs: 1230 {cells}/uL (ref 850–3900)
MCH: 30.7 pg (ref 27.0–33.0)
MCHC: 33.4 g/dL (ref 32.0–36.0)
MCV: 91.8 fL (ref 80.0–100.0)
MPV: 10.2 fL (ref 7.5–12.5)
Monocytes Relative: 15.2 %
Neutro Abs: 2700 cells/uL (ref 1500–7800)
Neutrophils Relative %: 55.1 %
Platelets: 281 10*3/uL (ref 140–400)
RBC: 4.76 10*6/uL (ref 4.20–5.80)
RDW: 13.1 % (ref 11.0–15.0)
Total Lymphocyte: 25.1 %
WBC: 4.9 10*3/uL (ref 3.8–10.8)

## 2022-11-23 LAB — COMPLETE METABOLIC PANEL WITH GFR
AG Ratio: 1.6 (calc) (ref 1.0–2.5)
ALT: 23 U/L (ref 9–46)
AST: 20 U/L (ref 10–35)
Albumin: 3.9 g/dL (ref 3.6–5.1)
Alkaline phosphatase (APISO): 51 U/L (ref 35–144)
BUN: 12 mg/dL (ref 7–25)
CO2: 25 mmol/L (ref 20–32)
Calcium: 9 mg/dL (ref 8.6–10.3)
Chloride: 107 mmol/L (ref 98–110)
Creat: 1.05 mg/dL (ref 0.70–1.35)
Globulin: 2.4 g/dL (ref 1.9–3.7)
Glucose, Bld: 106 mg/dL — ABNORMAL HIGH (ref 65–99)
Potassium: 4.3 mmol/L (ref 3.5–5.3)
Sodium: 140 mmol/L (ref 135–146)
Total Bilirubin: 0.4 mg/dL (ref 0.2–1.2)
Total Protein: 6.3 g/dL (ref 6.1–8.1)
eGFR: 78 mL/min/{1.73_m2} (ref >=60)

## 2022-11-23 LAB — C-REACTIVE PROTEIN: CRP: 11.2 mg/L — ABNORMAL HIGH (ref <8.0)

## 2022-11-23 LAB — SEDIMENTATION RATE: Sed Rate: 14 mm/h (ref 0–20)

## 2022-11-23 NOTE — Progress Notes (Signed)
The elbow fluid shows 1986 white blood cells this is mildly inflammatory it is not suggestive for any infection and there was no significant red blood cells.  There were no crystals.

## 2022-11-28 LAB — ANAEROBIC AND AEROBIC CULTURE
AER RESULT:: NO GROWTH
MICRO NUMBER:: 15362497
MICRO NUMBER:: 15362498
SPECIMEN QUALITY:: ADEQUATE
SPECIMEN QUALITY:: ADEQUATE

## 2022-11-28 LAB — SYNOVIAL FLUID ANALYSIS, COMPLETE
Basophils, %: 0 %
Eosinophils-Synovial: 0 % (ref 0–2)
Lymphocytes-Synovial Fld: 7 % (ref 0–74)
Monocyte/Macrophage: 4 % (ref 0–69)
Neutrophil, Synovial: 89 % — ABNORMAL HIGH (ref 0–24)
Synoviocytes, %: 0 % (ref 0–15)
WBC, Synovial: 1986 {cells}/uL — ABNORMAL HIGH (ref <150)

## 2022-11-30 MED ORDER — TRIAMCINOLONE ACETONIDE 40 MG/ML IJ SUSP
40.0000 mg | INTRAMUSCULAR | Status: AC | PRN
Start: 2022-11-22 — End: 2022-11-22
  Administered 2022-11-22: 40 mg via INTRA_ARTICULAR

## 2022-11-30 MED ORDER — LIDOCAINE HCL 1 % IJ SOLN
1.0000 mL | INTRAMUSCULAR | Status: AC | PRN
Start: 2022-11-22 — End: 2022-11-22
  Administered 2022-11-22: 1 mL

## 2023-01-02 ENCOUNTER — Other Ambulatory Visit (HOSPITAL_COMMUNITY): Payer: Self-pay | Admitting: Physician Assistant

## 2023-02-14 NOTE — Progress Notes (Deleted)
Office Visit Note  Patient: Peter Mcmillan             Date of Birth: 1955/12/01           MRN: [id number]             PCP: Wilfrid Lund, PA Referring: Wilfrid Lund, [place] Visit Date: 02/28/2023   Subjective:  No chief complaint on file.   History of Present Illness: Peter Mcmillan is a 67 y.o. male here for follow up for seropositive RA on methotrexate 25 mg p.o. weekly folic acid 1 mg daily and prednisone 10 mg daily    Previous HPI 11/22/2022 Peter Mcmillan is a 67 y.o. male here for follow up for seropositive RA on methotrexate 25 mg p.o. weekly folic acid 1 mg daily and prednisone 10 mg daily unfortunately still having significant joint pain and stiffness on a daily basis.  Currently his biggest complaint is with the recurrence of elbow pain and swelling with a very large lump especially on the left side.  This is bothersome because it is crating a pressure area and hitting against things as well as pain in the joint and difficulty fully extending his elbow.     Previous HPI 08/21/2022 Peter Mcmillan is a 67 y.o. male here for follow-up for seropositive RA after resuming methotrexate 25 mg p.o. weekly folic acid 1 mg daily and initial treatment with prednisone taper starting from 20 mg over 12 days.  Initially had benefit while taking the prednisone since stopping it he immediately saw an increase in joint pain affecting bilateral hands and feet again has also developed swelling overlying the elbows and on anterior of both knees.  Very prolonged stiffness on a daily basis beginning after waking lasting until around 3 to 4 PM.   07/24/22 Peter Mcmillan is a 67 y.o. male here for follow up for seropositive RA previously on methotrexate but has been out of the medication for about 2 months was taking 25 mg p.o. weekly he still on the folic acid 1 mg daily.  He has been experiencing increased joint pain and swelling in his hands shoulders and knees  more severe on the right side than left.  Did not recall any other illness or injury prior to symptom worsening.  We spoke by phone 2 days ago and started on prednisone taper he is on 20 mg today is day 3 so far seeing a partial improvement joint pain and swelling is decreased but still has prolonged morning stiffness and still having some swelling in that right hand and shoulder.  He had recent cardiology follow-up everything is doing well and not in A-fib.   Previous HPI 09/16/21 Peter Mcmillan is a 67 y.o. 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  Hand pain is pretty stable without any major flareup or exacerbation.  His current complaint is due to right elbow swelling.  He developed painful bursitis this was evaluated at urgent care clinic last month and again about a week ago.  Not particularly hot or inflamed but is painful and just remains swollen.  He does not recall any trauma preceding the onset of the swelling.   Previous HPI 06/13/2021 Peter Mcmillan is a 67 y.o. 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 He missed one does of methotrexate due to  running out of his medication and now has increased symptoms with bilateral hand pain and swelling. Right hand is worst affected and difficult to use normally. He also has a new cough ongoing for the past month it is not productive. Cough is most common when talking, when sitting and eating, or after he lies down in bed to sleep. He is not yet taking any seasonal allergy medication for the year. He is not taking any cough medicines.   Previous HPI 05/04/21 Peter Mcmillan is a 67 y.o. 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 weekly.  He stopped taking the Orencia injections for about a month ever since he was hospitalized for his A. fib.  He feels there was not  much difference when he took the shots and has not noticed much difference since stopping them.  He continues having some joint pain stiffness and swelling mostly affecting his hands and knees.  He is curious about any other anti-inflammatory medicine or supplements to try in addition to his RA medicines.   Previous HPI 09/23/20 Peter Mcmillan is a 67 y.o. male here for follow-up of seropositive rheumatoid arthritis on methotrexate 15 mg p.o. weekly and prednisone 20 mg daily.  He feels his symptoms have improved taking the medications although worsened greatly when he decrease his prednisone down to 10 mg as directed and so went back to taking 20 daily.  He has been out of the methotrexate since about 2 weeks ago.   Activities of Daily Living:  Patient reports morning stiffness for 24 hours.   Patient Reports nocturnal pain.  Difficulty dressing/grooming: Reports Difficulty climbing stairs: Reports Difficulty getting out of chair: Reports Difficulty using hands for taps, buttons, cutlery, and/or writing: Reports   No Rheumatology ROS completed.   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   Essential hypertension 06/11/2020   Gastro-esophageal reflux disease without esophagitis 06/11/2020   Hyperlipidemia 06/11/2020   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   Prediabetes 06/11/2020   Tobacco user 06/11/2020   Vitamin D deficiency 06/11/2020   Bilateral hand pain 06/11/2020   Bilateral shoulder pain 06/11/2020   Atrial  flutter (HCC) 12/09/2013   PULMONARY NODULE 08/18/2008   COUGH 08/18/2008    Past Medical History:  Diagnosis Date   Atrial flutter (HCC)    Bursitis of right elbow    Erectile dysfunction    Rheumatoid arthritis (HCC)    Tobacco abuse    Smokeless tobacco    Family History  Problem Relation Age of Onset   Heart attack Father    Heart attack Mother    Dementia Mother    Healthy Sister    Healthy Son    Past Surgical History:  Procedure Laterality Date   ATRIAL FIBRILLATION ABLATION N/A 12/17/2020   Procedure: ATRIAL FIBRILLATION ABLATION;  Surgeon: Lanier Prude, MD;  Location: [facility] INVASIVE CV LAB;  Service: Cardiovascular;  Laterality: N/A;   BACK SURGERY  1999   CARDIOVERSION N/A 10/06/2020   Procedure: CARDIOVERSION;  Surgeon: Little Ishikawa, MD;  Location: [facility] ENDOSCOPY;  Service: Cardiovascular;  Laterality: N/A;   MELANOMA EXCISION  2020   TEE WITHOUT CARDIOVERSION N/A 10/06/2020   Procedure: TRANSESOPHAGEAL ECHOCARDIOGRAM (TEE);  Surgeon: Little Ishikawa, MD;  Location: [facility] ENDOSCOPY;  Service: Cardiovascular;  Laterality: N/A;   TEE WITHOUT CARDIOVERSION N/A 12/17/2020   Procedure: TRANSESOPHAGEAL ECHOCARDIOGRAM (TEE);  Surgeon: Sande Rives, MD;  Location: [facility] INVASIVE CV LAB;  Service: Cardiovascular;  Laterality: N/A;   Social History   Social History Narrative   Not on file   Immunization History  Administered Date(s) Administered   Hep A / Hep B 02/17/2011   Influenza Split 01/29/2017   Moderna Sars-Covid-2 Vaccination 07/09/2019, 07/09/2019, 08/06/2019, 08/06/2019   Pneumococcal Polysaccharide-23 02/12/2009   Tdap 02/12/2009, 06/13/2017, 06/18/2020     Objective: Vital Signs: There were no vitals taken for this visit.   Physical Exam   Musculoskeletal Exam: ***  CDAI Exam: CDAI Score: -- Patient Global: --; Provider Global: -- Swollen: --; Tender: -- Joint Exam 02/28/2023   No joint exam has been documented for this visit    There is currently no information documented on the homunculus. Go to the Rheumatology activity and complete the homunculus joint exam.  Investigation: No additional findings.  Imaging: No results found.  Recent Labs: Lab Results  Component Value Date   WBC 4.9 11/22/2022   HGB 14.6 11/22/2022   PLT 281 11/22/2022   NA 140 11/22/2022   K 4.3 11/22/2022   CL 107 11/22/2022   CO2 25 11/22/2022   GLUCOSE 106 (H) 11/22/2022   BUN 12 11/22/2022   CREATININE 1.05 11/22/2022   BILITOT 0.4 11/22/2022   ALKPHOS 44 12/28/2020   AST 20 11/22/2022   ALT 23 11/22/2022   PROT 6.3 11/22/2022   ALBUMIN 3.8 12/28/2020   CALCIUM 9.0 11/22/2022   GFRAA 111 09/23/2020   QFTBGOLDPLUS NEGATIVE 08/21/2022    Speciality Comments: No specialty comments available.  Procedures:  No procedures performed Allergies: Codeine, Other, Penicillin g, and Penicillins   Assessment / Plan:     Visit Diagnoses: No diagnosis found.  ***  Orders: No orders of the defined types were placed in this encounter.  No orders of the defined types were placed in this encounter.    Follow-Up Instructions: No follow-ups on file.   Metta Clines, RT  Note - This record has been created using [employer].  Chart creation errors have been sought, but may not always  have been located. Such creation errors do not reflect on  the standard of medical care.

## 2023-02-17 ENCOUNTER — Other Ambulatory Visit: Payer: Self-pay | Admitting: Internal Medicine

## 2023-02-17 DIAGNOSIS — M059 Rheumatoid arthritis with rheumatoid factor, unspecified: Secondary | ICD-10-CM

## 2023-02-19 NOTE — Telephone Encounter (Signed)
Last Fill: 12/02/2022  Next Visit: 02/21/2023  Last Visit: 11/22/2022  Dx: Seropositive rheumatoid arthritis   Current Dose per office note on 11/22/2022: prednisone 10 mg daily   Okay to refill Prednisone?

## 2023-02-21 ENCOUNTER — Ambulatory Visit: Payer: Commercial Managed Care - PPO | Attending: Internal Medicine | Admitting: Internal Medicine

## 2023-02-21 ENCOUNTER — Encounter: Payer: Self-pay | Admitting: Internal Medicine

## 2023-02-21 VITALS — BP 126/81 | HR 96 | Resp 14 | Ht 77.0 in | Wt 233.0 lb

## 2023-02-21 DIAGNOSIS — M059 Rheumatoid arthritis with rheumatoid factor, unspecified: Secondary | ICD-10-CM | POA: Diagnosis not present

## 2023-02-21 DIAGNOSIS — Z79899 Other long term (current) drug therapy: Secondary | ICD-10-CM | POA: Diagnosis not present

## 2023-02-21 MED ORDER — PREDNISONE 10 MG PO TABS: ORAL_TABLET | ORAL | 0 refills | Status: DC

## 2023-02-21 NOTE — Progress Notes (Unsigned)
Office Visit Note  Patient: Peter Mcmillan             Date of Birth: 1955/09/22           MRN: [id number]             PCP: Wilfrid Lund, PA Referring: Wilfrid Lund, [place] Visit Date: 02/21/2023   Subjective:  Follow-up (Patient states he has not taken prednisone in two weeks and patient states he stopped taking the methotrexate about a month ago because it does not work. )   History of Present Illness: Peter Mcmillan is a 67 y.o. male here for follow up for seropositive RA currently he is off medications.  He was taking methotrexate 25 mg p.o. weekly but stopped taking this medicine for several weeks because he had not seen any noticeable benefit despite a long treatment course.  He is also off of prednisone since 2 weeks after completing the last prescription.  He is now having a flareup with joint pain stiffness and swelling in multiple areas started since Sunday 3 days ago.  He is also concerned because of most severe pain is in his feet and with extensive swelling throughout the feet and in his shins which has previously been a symptom when he was in A-fib with RVR. He discussed with his dermatologist about possibly starting biologic and after detailed skin exam and his history of melanoma they felt this was not currently contraindicated.  Previous HPI 11/22/22 Peter Mcmillan is a 67 y.o. male here for follow up for seropositive RA on methotrexate 25 mg p.o. weekly folic acid 1 mg daily and prednisone 10 mg daily unfortunately still having significant joint pain and stiffness on a daily basis.  Currently his biggest complaint is with the recurrence of elbow pain and swelling with a very large lump especially on the left side.  This is bothersome because it is crating a pressure area and hitting against things as well as pain in the joint and difficulty fully extending his elbow.     Previous HPI 08/21/2022 Peter Mcmillan is a 67 y.o. male here for  follow-up for seropositive RA after resuming methotrexate 25 mg p.o. weekly folic acid 1 mg daily and initial treatment with prednisone taper starting from 20 mg over 12 days.  Initially had benefit while taking the prednisone since stopping it he immediately saw an increase in joint pain affecting bilateral hands and feet again has also developed swelling overlying the elbows and on anterior of both knees.  Very prolonged stiffness on a daily basis beginning after waking lasting until around 3 to 4 PM.   07/24/22 Peter Mcmillan is a 67 y.o. male here for follow up for seropositive RA previously on methotrexate but has been out of the medication for about 2 months was taking 25 mg p.o. weekly he still on the folic acid 1 mg daily.  He has been experiencing increased joint pain and swelling in his hands shoulders and knees more severe on the right side than left.  Did not recall any other illness or injury prior to symptom worsening.  We spoke by phone 2 days ago and started on prednisone taper he is on 20 mg today is day 3 so far seeing a partial improvement joint pain and swelling is decreased but still has prolonged morning stiffness and still having some swelling in that right hand and shoulder.  He had recent cardiology follow-up everything is doing well and  not in A-fib.   Previous HPI 09/16/21 Peter Mcmillan is a 67 y.o. 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  Hand pain is pretty stable without any major flareup or exacerbation.  His current complaint is due to right elbow swelling.  He developed painful bursitis this was evaluated at urgent care clinic last month and again about a week ago.  Not particularly hot or inflamed but is painful and just remains swollen.  He does not recall any trauma preceding the onset of the swelling.   Previous HPI 06/13/2021 Peter Mcmillan is a 67 y.o. male here for follow up for seropositive RA on  methotrexate 15 mg PO weekly after last visit trying adding turmeric and continuing off the orencia. He missed one does of methotrexate due to running out of his medication and now has increased symptoms with bilateral hand pain and swelling. Right hand is worst affected and difficult to use normally. He also has a new cough ongoing for the past month it is not productive. Cough is most common when talking, when sitting and eating, or after he lies down in bed to sleep. He is not yet taking any seasonal allergy medication for the year. He is not taking any cough medicines.   Previous HPI 05/04/21 Peter Mcmillan is a 67 y.o. 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 weekly.  He stopped taking the Orencia injections for about a month ever since he was hospitalized for his A. fib.  He feels there was not much difference when he took the shots and has not noticed much difference since stopping them.  He continues having some joint pain stiffness and swelling mostly affecting his hands and knees.  He is curious about any other anti-inflammatory medicine or supplements to try in addition to his RA medicines.   Previous HPI 09/23/20 Peter Mcmillan is a 67 y.o. male here for follow-up of seropositive rheumatoid arthritis on methotrexate 15 mg p.o. weekly and prednisone 20 mg daily.  He feels his symptoms have improved taking the medications although worsened greatly when he decrease his prednisone down to 10 mg as directed and so went back to taking 20 daily.  He has been out of the methotrexate since about 2 weeks ago.   Review of Systems  Constitutional:  Positive for fatigue.  HENT:  Negative for mouth sores and mouth dryness.   Eyes:  Negative for dryness.  Respiratory:  Positive for shortness of breath.   Cardiovascular:  Positive for chest pain. Negative for palpitations.  Gastrointestinal:  Negative for blood in stool, constipation and diarrhea.  Endocrine:  Negative for increased urination.  Genitourinary:  Negative for involuntary urination.  Musculoskeletal:  Positive for joint pain, gait problem, joint pain, joint swelling, myalgias, muscle weakness, morning stiffness, muscle tenderness and myalgias.  Skin:  Negative for color change, rash, hair loss and sensitivity to sunlight.  Allergic/Immunologic: Negative for susceptible to infections.  Neurological:  Positive for dizziness. Negative for headaches.  Hematological:  Negative for swollen glands.  Psychiatric/Behavioral:  Positive for sleep disturbance. Negative for depressed mood. The patient is not nervous/anxious.     PMFS History:  Patient Active Problem List   Diagnosis Date Noted   Secondary hypercoagulable state (HCC) 04/05/2021   Atypical atrial flutter (HCC) 04/05/2021   Atrial fibrillation (HCC) 12/17/2020   Rapid atrial fibrillation (HCC) 11/01/2020   Atrial flutter with rapid ventricular response (HCC) 10/05/2020  Seropositive rheumatoid arthritis (HCC) 07/15/2020   High risk medication use 07/15/2020   Allergic rhinitis due to pollen 06/11/2020   ED (erectile dysfunction) of organic origin 06/11/2020   Essential hypertension 06/11/2020   Gastro-esophageal reflux disease without esophagitis 06/11/2020   Hyperlipidemia 06/11/2020   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   Prediabetes 06/11/2020   Tobacco user 06/11/2020   Vitamin D deficiency 06/11/2020   Bilateral hand pain 06/11/2020   Bilateral shoulder pain 06/11/2020   Atrial flutter (HCC) 12/09/2013   PULMONARY NODULE 08/18/2008   COUGH 08/18/2008    Past Medical History:  Diagnosis Date   Atrial flutter (HCC)    Bursitis of right elbow    Erectile dysfunction    Rheumatoid arthritis (HCC)    Tobacco abuse    Smokeless tobacco    Family History  Problem Relation Age of Onset   Heart attack Father    Heart attack Mother    Dementia Mother    Healthy Sister    Healthy Son    Past Surgical  History:  Procedure Laterality Date   ATRIAL FIBRILLATION ABLATION N/A 12/17/2020   Procedure: ATRIAL FIBRILLATION ABLATION;  Surgeon: Lanier Prude, MD;  Location: [facility] INVASIVE CV LAB;  Service: Cardiovascular;  Laterality: N/A;   BACK SURGERY  1999   CARDIOVERSION N/A 10/06/2020   Procedure: CARDIOVERSION;  Surgeon: Little Ishikawa, MD;  Location: [facility] ENDOSCOPY;  Service: Cardiovascular;  Laterality: N/A;   MELANOMA EXCISION  2020   TEE WITHOUT CARDIOVERSION N/A 10/06/2020   Procedure: TRANSESOPHAGEAL ECHOCARDIOGRAM (TEE);  Surgeon: Little Ishikawa, MD;  Location: [facility] ENDOSCOPY;  Service: Cardiovascular;  Laterality: N/A;   TEE WITHOUT CARDIOVERSION N/A 12/17/2020   Procedure: TRANSESOPHAGEAL ECHOCARDIOGRAM (TEE);  Surgeon: Sande Rives, MD;  Location: [facility] INVASIVE CV LAB;  Service: Cardiovascular;  Laterality: N/A;   Social History   Social History Narrative   Not on file   Immunization History  Administered Date(s) Administered   Hep A / Hep B 02/17/2011   Influenza Split 01/29/2017   Moderna Sars-Covid-2 Vaccination 07/09/2019, 07/09/2019, 08/06/2019, 08/06/2019   Pneumococcal Polysaccharide-23 02/12/2009   Tdap 02/12/2009, 06/13/2017, 06/18/2020     Objective: Vital Signs: BP 126/81 (BP Location: Left Arm, Patient Position: Sitting, Cuff Size: Normal)   Pulse 96   Resp 14   Ht 6\' 5"  (1.956 m)   Wt 233 lb (105.7 kg)   BMI 27.63 kg/m    Physical Exam Eyes:     Conjunctiva/sclera: Conjunctivae normal.  Cardiovascular:     Rate and Rhythm: Normal rate.  Pulmonary:     Effort: Pulmonary effort is normal.     Breath sounds: Normal breath sounds.  Musculoskeletal:     Right lower leg: Edema present.     Left lower leg: Edema present.  Skin:    General: Skin is warm and dry.  Neurological:     Mental Status: He is alert.  Psychiatric:        Mood and Affect: Mood normal.      Musculoskeletal Exam:  Bilateral shoulders painful with abduction  to horizontal or above passive or active Bilateral elbow tenderness with right olecranon bursa swelling Right wrist swelling with significantly diminished flexion and extension range of motion Swelling of right second and third MCP joints tenderness throughout the MCPs Left hand second third MCPs and PIPs definitely swollen significantly restricted grip and finger extension ROM  Investigation: No additional findings.  Imaging: No results found.  Recent Labs: Lab Results  Component Value Date   WBC 7.0 02/21/2023   HGB 15.4 02/21/2023   PLT 255 02/21/2023   NA 138 02/21/2023   K 3.9 02/21/2023   CL 102 02/21/2023   CO2 26 02/21/2023   GLUCOSE 149 (H) 02/21/2023   BUN 12 02/21/2023   CREATININE 0.80 02/21/2023   BILITOT 0.8 02/21/2023   ALKPHOS 44 12/28/2020   AST 21 02/21/2023   ALT 30 02/21/2023   PROT 6.1 02/21/2023   ALBUMIN 3.8 12/28/2020   CALCIUM 8.7 02/21/2023   GFRAA 111 09/23/2020   QFTBGOLDPLUS NEGATIVE 08/21/2022    Speciality Comments: No specialty comments available.  Procedures:  No procedures performed Allergies: Codeine, Other, Penicillin g, and Penicillins   Assessment / Plan:     Visit Diagnoses: Seropositive rheumatoid arthritis (HCC) - Plan: Sedimentation rate, C-reactive protein, predniSONE (DELTASONE) 10 MG tablet  Currently appears inflamed in multiple areas due to coming off medications although methotrexate was not doing much when not also on at least 10 mg prednisone.  Checking sed rate and CRP for disease activity assessment.  Will restart on prednisone initially to do 20 mg daily for a week then 10 mg daily.  Will plan to start on adalimumab 40 mg [address] q14days.  High risk medication use - Plan: CBC with Differential/Platelet, COMPLETE METABOLIC PANEL WITH GFR  Checking CBC and CMP for medication monitoring anticipating new biologic DMARD start.  Previous hepatitis and QuantiFERON screening were normal.  Reviewed risk of medication including  injection site reactions, infections, malignancy risk into including nonmelanoma skin cancer and lymphoma.  Edema  He is already scheduled for follow-up with cardiology office.  On my exam heart rate is currently at a normal rate and he is not experiencing any dyspnea symptoms and lung exam is normal.  Orders: Orders Placed This Encounter  Procedures   Sedimentation rate   CBC with Differential/Platelet   COMPLETE METABOLIC PANEL WITH GFR   C-reactive protein   Meds ordered this encounter  Medications   predniSONE (DELTASONE) 10 MG tablet    Sig: Take 2 tablets (20 mg total) by mouth daily with breakfast for 7 days, THEN 1 tablet (10 mg total) daily with breakfast.    Dispense:  44 tablet    Refill:  0     Follow-Up Instructions: Return in about 2 months (around 04/23/2023) for RA ADA start f/u 2mos.   Fuller Plan, MD  Note - This record has been created using [employer].  Chart creation errors have been sought, but may not always  have been located. Such creation errors do not reflect on  the standard of medical care.

## 2023-02-22 ENCOUNTER — Telehealth: Payer: Self-pay | Admitting: Pharmacist

## 2023-02-22 LAB — SEDIMENTATION RATE: Sed Rate: 29 mm/h — ABNORMAL HIGH (ref 0–20)

## 2023-02-22 LAB — COMPLETE METABOLIC PANEL WITH GFR
AG Ratio: 1.5 (calc) (ref 1.0–2.5)
ALT: 30 U/L (ref 9–46)
AST: 21 U/L (ref 10–35)
Albumin: 3.7 g/dL (ref 3.6–5.1)
Alkaline phosphatase (APISO): 49 U/L (ref 35–144)
BUN: 12 mg/dL (ref 7–25)
CO2: 26 mmol/L (ref 20–32)
Calcium: 8.7 mg/dL (ref 8.6–10.3)
Chloride: 102 mmol/L (ref 98–110)
Creat: 0.8 mg/dL (ref 0.70–1.35)
Globulin: 2.4 g/dL (ref 1.9–3.7)
Glucose, Bld: 149 mg/dL — ABNORMAL HIGH (ref 65–99)
Potassium: 3.9 mmol/L (ref 3.5–5.3)
Sodium: 138 mmol/L (ref 135–146)
Total Bilirubin: 0.8 mg/dL (ref 0.2–1.2)
Total Protein: 6.1 g/dL (ref 6.1–8.1)
eGFR: 97 mL/min/{1.73_m2} (ref >=60)

## 2023-02-22 LAB — CBC WITH DIFFERENTIAL/PLATELET
Absolute Lymphocytes: 903 {cells}/uL (ref 850–3900)
Absolute Monocytes: 539 {cells}/uL (ref 200–950)
Basophils Absolute: 42 {cells}/uL (ref 0–200)
Basophils Relative: 0.6 %
Eosinophils Absolute: 112 {cells}/uL (ref 15–500)
Eosinophils Relative: 1.6 %
HCT: 46.1 % (ref 38.5–50.0)
Hemoglobin: 15.4 g/dL (ref 13.2–17.1)
MCH: 30.9 pg (ref 27.0–33.0)
MCHC: 33.4 g/dL (ref 32.0–36.0)
MCV: 92.4 fL (ref 80.0–100.0)
MPV: 10 fL (ref 7.5–12.5)
Monocytes Relative: 7.7 %
Neutro Abs: 5404 {cells}/uL (ref 1500–7800)
Neutrophils Relative %: 77.2 %
Platelets: 255 10*3/uL (ref 140–400)
RBC: 4.99 10*6/uL (ref 4.20–5.80)
RDW: 12.5 % (ref 11.0–15.0)
Total Lymphocyte: 12.9 %
WBC: 7 10*3/uL (ref 3.8–10.8)

## 2023-02-22 LAB — C-REACTIVE PROTEIN: CRP: 66.4 mg/L — ABNORMAL HIGH (ref <8.0)

## 2023-02-22 NOTE — Telephone Encounter (Addendum)
Submitted a Prior Authorization request to [facility] for HUMIRA via CoverMyMeds. Will update once we receive a response.  Key: [id number]   ----- Message from Fuller Plan sent at 02/22/2023  5:55 AM EST ----- Please start application for adalimumab for Mr. Flavin. He has not been on any biologics previously so I recommend in office new start visit.

## 2023-02-22 NOTE — Progress Notes (Signed)
Sedimentation rate of 29 and CRP of 66.4 are high consistent with a flareup of rheumatoid arthritis.  Otherwise blood count kidney and liver function are all normal.

## 2023-02-25 NOTE — Progress Notes (Unsigned)
Cardiology Office Note   Date:  02/27/2023   ID:  Naszier, Kaylor 1956-03-11, MRN [id number]  PCP:  Wilfrid Lund, PA  Cardiologist:   Dietrich Pates, MD   Patient presents for follow up of atrial fibrillation     History of Present Illness: Peter Mcmillan is a 67 y.o. male with a history of atrial flutter, atrial fibrillation, systolic dysfunction, RA   Patient presented to the hospital 10/05/20 with afib RVR and was started on Eliquis for a CHADS2VASC score of 1. He underwent TEE guided DCCV on 10/06/20. He was hospitalized again 11/01/20 for progressive dyspnea and lower extremity edema. He was cardioverted in the ED then admitted for observation and diuresis. Patient presented to the ER  8/14 with atrial flutter with RVR. He was successfully cardioverted.    Patient underwent afib and flutter ablation by Jeanie Cooks on 12/17/20. Unfortunately, he had recurrence of rapid afib and had another DCCV in the ED on 12/28/20. His metoprolol was also increased.    I saw the pt in Oct 2022   He has been seen in afib clinic since, last time in April 2024   He was in SR at that time    Patient says he has taken prednisone for RA  Since starting this  he has noticed ankle swelling    Humera was just added     The pt notes some chest tightness when his heart is racing     Current Meds  Medication Sig   adalimumab (HUMIRA, 2 PEN,) 40 MG/0.4ML pen Inject 0.4 mLs (40 mg total) into the skin every 14 (fourteen) days. 1 kit - 2 pens   Cholecalciferol 100 MCG (4000 UT) CAPS Take 1 capsule (4,000 Units total) by mouth daily in the afternoon.   dofetilide (TIKOSYN) 250 MCG capsule TAKE ONE CAPSULE BY MOUTH TWICE DAILY   ELIQUIS 5 MG TABS tablet TAKE ONE TABLET BY MOUTH TWICE A DAY   ENTRESTO 24-26 MG TAKE ONE TABLET BY MOUTH TWICE (2) DAILY   folic acid (FOLVITE) 1 MG tablet Take 1 tablet (1 mg total) by mouth daily.   loratadine (CLARITIN) 10 MG tablet Take 10 mg by mouth daily.    Magnesium 200 MG TABS TAKE ONE TABLET BY MOUTH ONCE A DAY   metoprolol succinate (TOPROL-XL) 50 MG 24 hr tablet TAKE ONE TABLET BY MOUTH TWICE A DAY. TAKE WITH OR IMMEDIATELY FOLLOWING A MEAL.   omeprazole (PRILOSEC) 40 MG capsule Take 40 mg by mouth 2 (two) times daily.   potassium chloride (KLOR-CON) 10 MEQ tablet TAKE ONE TABLET BY MOUTH DAILY   predniSONE (DELTASONE) 10 MG tablet Take 2 tablets (20 mg total) by mouth daily with breakfast for 7 days, THEN 1 tablet (10 mg total) daily with breakfast.     Allergies:   Codeine, Other, Penicillin g, and Penicillins   Past Medical History:  Diagnosis Date   Atrial flutter (HCC)    Bursitis of right elbow    Erectile dysfunction    Rheumatoid arthritis (HCC)    Tobacco abuse    Smokeless tobacco    Past Surgical History:  Procedure Laterality Date   ATRIAL FIBRILLATION ABLATION N/A 12/17/2020   Procedure: ATRIAL FIBRILLATION ABLATION;  Surgeon: Lanier Prude, MD;  Location: [facility] INVASIVE CV LAB;  Service: Cardiovascular;  Laterality: N/A;   BACK SURGERY  1999   CARDIOVERSION N/A 10/06/2020   Procedure: CARDIOVERSION;  Surgeon: Little Ishikawa, MD;  Location: [facility] ENDOSCOPY;  Service: Cardiovascular;  Laterality: N/A;   MELANOMA EXCISION  2020   TEE WITHOUT CARDIOVERSION N/A 10/06/2020   Procedure: TRANSESOPHAGEAL ECHOCARDIOGRAM (TEE);  Surgeon: Little Ishikawa, MD;  Location: [facility] ENDOSCOPY;  Service: Cardiovascular;  Laterality: N/A;   TEE WITHOUT CARDIOVERSION N/A 12/17/2020   Procedure: TRANSESOPHAGEAL ECHOCARDIOGRAM (TEE);  Surgeon: Sande Rives, MD;  Location: [facility] INVASIVE CV LAB;  Service: Cardiovascular;  Laterality: N/A;     Social History:  The patient  reports that he has never smoked. He has never been exposed to tobacco smoke. His smokeless tobacco use includes snuff. He reports current alcohol use. He reports that he does not use drugs.   Family History:  The patient's family history includes Dementia  in his mother; Healthy in his sister and son; Heart attack in his father and mother.    ROS:  Please see the history of present illness. All other systems are reviewed and  Negative to the above problem except as noted.    PHYSICAL EXAM: VS:  BP 118/70   Pulse 89   Ht 6\' 5"  (1.956 m)   Wt 234 lb (106.1 kg)   SpO2 96%   BMI 27.75 kg/m   GEN: OVerweight 65 bpm  in no acute distress  HEENT: normal  Neck: JVP is normal  Cardiac: RRR; no murmurs  No LE edema  Respiratory:  clear to auscultation  GI: soft, nontender  No hepatomegaly    MS: Rheumatoid changes in R hand     EKG:  EKG is not ordered today.   Lipid Panel    Component Value Date/Time   CHOL 137 10/05/2020 0844   TRIG 34 10/05/2020 0844   HDL 54 10/05/2020 0844   CHOLHDL 2.5 10/05/2020 0844   VLDL 7 10/05/2020 0844   LDLCALC 76 10/05/2020 0844      Wt Readings from Last 3 Encounters:  02/27/23 234 lb (106.1 kg)  02/21/23 233 lb (105.7 kg)  11/22/22 234 lb (106.1 kg)      ASSESSMENT AND PLAN:  1  Atrial fibrillation.   Pt s/p ablation   Recurrence.  Cardioverted     He now feels HR going faster since starting prednisone    Will set up for 7 day monitor   He is in SR on exam    Monitor to see in in/out of afib.  2 CHF   Volume status does not look bad   But he complains of LE edema    Will set up for BMET, BNP    3  HCM  Will check lipids and A1C  4  RA   Pt just staring on Humera   Follow .    Current medicines are reviewed at length with the patient today.  The patient does not have concerns regarding medicines.  Signed, Dietrich Pates, MD  02/27/2023 1:36 PM    [facility] [address] [place], [place], [place]  [address] Phone: [phone]; Fax: [phone]

## 2023-02-26 ENCOUNTER — Telehealth: Payer: Self-pay | Admitting: *Deleted

## 2023-02-26 ENCOUNTER — Other Ambulatory Visit (HOSPITAL_COMMUNITY): Payer: Self-pay

## 2023-02-26 NOTE — Telephone Encounter (Signed)
Received notification from [facility] regarding a prior authorization for HUMIRA. Authorization has been APPROVED from 02/22/23 to 08/22/23. Approval letter sent to scan center.  Per test claim, copay for 28 days supply is $2,141.89  Authorization # [id number]  Patient does not qualify for [employer] PAP this year due to increase in income. Next year, he and his wife's household income will allow them to qualify. I also reviewed Medicare changes to OOP. He said he could possibly afford it next year.  We will need to get him started with samples through the end of this year and then transition him to either PAP or Medicare coverage in 2025.  Chesley Mires, PharmD, MPH, BCPS, CPP Clinical Pharmacist (Rheumatology and Pulmonology)

## 2023-02-26 NOTE — Patient Instructions (Incomplete)
Your next HUMIRA dose is due on 03/13/2023, 03/27/23, and every 14 days thereafter  Continue prednisone as prescribed for now  HOLD HUMIRA if you have signs or symptoms of an infection. You can resume once you feel better or back to your baseline. HOLD HUMIRA if you start antibiotics to treat an infection. HOLD HUMIRA around the time of surgery/procedures. Your surgeon will be able to provide recommendations on when to hold BEFORE and when you are cleared to RESUME.  Pharmacy information: We will need to apply for patient assistance through [facility] after January 2024. Please provide a copy of your tax return from 2023 if possible and if this will reflect your income in 2025 Please be advised that if denied for patient assistance, will need to pay copay through insurance. Please look into Medicare Prescription Payment Plan in the meantime (this caps copays month to month for prescription drugs)  Labs are due in 1 month then every 3 months. Lab hours are from Monday to Thursday 8am-12:30pm and 1pm-5pm and Friday 8am-12pm. You do not need an appointment if you come for labs during these times. If you'd like to go to a [facility] or [facility] closer to home, please call our clinic 48 hours prior to lab date so we can release orders in a timely manner.  Stay up to date on all routine vaccines: influenza, pneumonia, COVID19, Shingles  How to manage an injection site reaction: Remember the 5 C's: COUNTER - leave on the counter at least 30 minutes but up to overnight to bring medication to room temperature. This may help prevent stinging COLD - place something cold (like an ice gel pack or cold water bottle) on the injection site just before cleansing with alcohol. This may help reduce pain CLARITIN - use Claritin (generic name is loratadine) for the first two weeks of treatment or the day of, the day before, and the day after injecting. This will help to minimize injection site reactions CORTISONE CREAM -  apply if injection site is irritated and itching CALL ME - if injection site reaction is bigger than the size of your fist, looks infected, blisters, or if you develop hives

## 2023-02-26 NOTE — Telephone Encounter (Signed)
Patient contact the office to check the status of starting the Humira. Patient states he has received the approval message from the insurance. He would like to get started on the medication as soon as possible. Please reach out to patient and advise.

## 2023-02-26 NOTE — Progress Notes (Signed)
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 Patient currently takes prednisone 20mg  daily after most recent flare but overall recurrently on prednisone 10mg  daily minimum to manage his symptoms.  He stopped methotrexate sometime before his last appointment due to being ineffective.  Of note, patient does have history of afib, is smokeless tobacco user. On Eliquis, dofetilide, Entresto. Patient was cleared by dermatologist to move forward with TNF inhibitor.  Patient running a fever or have signs/symptoms of infection? No  Patient currently on antibiotics for the treatment of infection? No  Patient have any upcoming invasive procedures/surgeries? No  Objective: CMP     Component Value Date/Time   NA 138 02/21/2023 1414   NA 140 11/30/2020 1523   K 3.9 02/21/2023 1414   CL 102 02/21/2023 1414   CO2 26 02/21/2023 1414   GLUCOSE 149 (H) 02/21/2023 1414   BUN 12 02/21/2023 1414   BUN 11 11/30/2020 1523   CREATININE 0.80 02/21/2023 1414   CALCIUM 8.7 02/21/2023 1414   PROT 6.1 02/21/2023 1414   ALBUMIN 3.8 12/28/2020 1923   AST 21 02/21/2023 1414   ALT 30 02/21/2023 1414   ALKPHOS 44 12/28/2020 1923   BILITOT 0.8 02/21/2023 1414   GFRNONAA >60 07/03/2022 1529   GFRNONAA 95 09/23/2020 1312   GFRAA 111 09/23/2020 1312    CBC    Component Value Date/Time   WBC 7.0 02/21/2023 1414   RBC 4.99 02/21/2023 1414   HGB 15.4 02/21/2023 1414   HGB 15.6 11/30/2020 1523   HCT 46.1 02/21/2023 1414   HCT 46.1 11/30/2020 1523   PLT 255 02/21/2023 1414   PLT 345 11/30/2020 1523   MCV 92.4 02/21/2023 1414   MCV 90 11/30/2020 1523   MCH 30.9 02/21/2023 1414   MCHC 33.4 02/21/2023 1414   RDW 12.5 02/21/2023 1414   RDW 13.0 11/30/2020 1523   LYMPHSABS 1,230 11/22/2022 1540   LYMPHSABS 1.8 11/30/2020 1523   MONOABS 0.9 11/14/2020 1257   EOSABS 112 02/21/2023 1414   EOSABS 0.2 11/30/2020 1523   BASOSABS 42  02/21/2023 1414   BASOSABS 0.1 11/30/2020 1523    Baseline Immunosuppressant Therapy Labs TB GOLD    Latest Ref Rng & Units 08/21/2022    4:24 PM  Quantiferon TB Gold  Quantiferon TB Gold Plus NEGATIVE NEGATIVE    Hepatitis Panel    Latest Ref Rng & Units 07/15/2020    8:22 AM  Hepatitis  Hep B Surface Ag NON-REACTI NON-REACTIVE   Hep B IgM NON-REACTI NON-REACTIVE   Hep C Ab NON-REACTI NON-REACTIVE   Hep A IgM NON-REACTI NON-REACTIVE    HIV Lab Results  Component Value Date   HIV Non Reactive 10/05/2020   Immunoglobulins    Latest Ref Rng & Units 09/23/2020    1:12 PM  Immunoglobulin Electrophoresis  IgA  70 - 320 mg/dL 782   IgG 956 - 2,130 mg/dL 865   IgM 50 - 784 mg/dL 96    SPEP    Latest Ref Rng & Units 02/21/2023    2:14 PM  Serum Protein Electrophoresis  Total Protein 6.1 - 8.1 g/dL 6.1    O9GE No results found for: "G6PDH" TPMT No results found for: "TPMT"   Chest x-ray: 12/28/2020 = Mild chronic bronchitic changes but no acute pulmonary findings.  Assessment/Plan:  Reviewed importance of holding Humira with signs/symptoms of an infections, if antibiotics are prescribed to treat an active infection,  and with invasive procedures  Demonstrated proper injection technique with Humira demo device  Patient able to demonstrate proper injection technique using the teach back method.  Patient self injected in the left lower abdomen with:  Sample Medication: Humira 40mg /0.24mL pen injector NDC: [address]-08 Lot: [id number] Expiration: 08/2023  Patient tolerated well.  Observed for 30 mins in office for adverse reaction. Patient denies itchiness and irritation at injection., No swelling or redness noted., and Reviewed injection site reaction management with patient verbally and printed information for review in AVS  Patient is to return in 1 month for labs and 6-8 weeks for follow-up appointment.  Standing orders for CBC/CMP  placed.  TB gold will be monitored  yearly.   Patient provided with one additional Humira sample today. Humira approved through insurance  [employer] and [employer] through [employer]. However based on patient's income this year, we will not be able to move forward with PAP.  PAP completed at [facility] today in preparation of submission in 2025.  Patient will continue Humira 40mg  subcut every 14 days. He will continue prednisone taper as prescribed  All questions encouraged and answered.  Instructed patient to call with any further questions or concerns.  Chesley Mires, PharmD, MPH, BCPS, CPP Clinical Pharmacist (Rheumatology and Pulmonology)  02/26/2023 3:55 PM

## 2023-02-27 ENCOUNTER — Ambulatory Visit: Payer: Commercial Managed Care - PPO | Attending: Internal Medicine | Admitting: Pharmacist

## 2023-02-27 ENCOUNTER — Other Ambulatory Visit (HOSPITAL_COMMUNITY): Payer: Self-pay

## 2023-02-27 ENCOUNTER — Encounter: Payer: Self-pay | Admitting: Internal Medicine

## 2023-02-27 ENCOUNTER — Ambulatory Visit: Payer: Commercial Managed Care - PPO

## 2023-02-27 ENCOUNTER — Ambulatory Visit: Payer: Commercial Managed Care - PPO | Attending: Internal Medicine | Admitting: Internal Medicine

## 2023-02-27 VITALS — BP 118/70 | HR 89 | Ht 77.0 in | Wt 234.0 lb

## 2023-02-27 DIAGNOSIS — I10 Essential (primary) hypertension: Secondary | ICD-10-CM | POA: Diagnosis not present

## 2023-02-27 DIAGNOSIS — Z5181 Encounter for therapeutic drug level monitoring: Secondary | ICD-10-CM | POA: Diagnosis not present

## 2023-02-27 DIAGNOSIS — E782 Mixed hyperlipidemia: Secondary | ICD-10-CM

## 2023-02-27 DIAGNOSIS — I5022 Chronic systolic (congestive) heart failure: Secondary | ICD-10-CM

## 2023-02-27 DIAGNOSIS — I4819 Other persistent atrial fibrillation: Secondary | ICD-10-CM

## 2023-02-27 DIAGNOSIS — Z79899 Other long term (current) drug therapy: Secondary | ICD-10-CM

## 2023-02-27 DIAGNOSIS — M059 Rheumatoid arthritis with rheumatoid factor, unspecified: Secondary | ICD-10-CM

## 2023-02-27 MED ORDER — HUMIRA (2 PEN) 40 MG/0.4ML ~~LOC~~ AJKT: 40.0000 mg | AUTO-INJECTOR | SUBCUTANEOUS | 1 refills | Status: DC

## 2023-02-27 NOTE — Telephone Encounter (Signed)
Patient started Humira today but states he has both [employer] rx coverage and rx coverage through employer. He is retiring in March 2025  Submitted a Prior Authorization request to [employer] for [employer] via CoverMyMeds. Will update once we receive a response.  Key: [id number]   Per automated response: Drug is covered by current benefit plan. No further PA activity needed  Rx sent to [facility] today. Attempted to enroll patient into copay card but not populating due to >35 y/o. Requested copay card eligibility screening. Wil lneed to f/u with [employer] [facility]   Chesley Mires, PharmD, MPH, BCPS, CPP Clinical Pharmacist (Rheumatology and Pulmonology)

## 2023-02-27 NOTE — Progress Notes (Unsigned)
Enrolled for [employer] to mail a ZIO XT long term holter monitor to the patients address on file.  

## 2023-02-27 NOTE — Patient Instructions (Signed)
Medication Instructions:   *If you need a refill on your cardiac medications before your next appointment, please call your pharmacy*   Lab Work: BMET, PRO BNP, NMR, HGBA1C If you have labs (blood work) drawn today and your tests are completely normal, you will receive your results only by: MyChart Message (if you have MyChart) OR A paper copy in the mail If you have any lab test that is abnormal or we need to change your treatment, we will call you to review the results.   Testing/Procedures: Peter Mcmillan- Long Term Monitor Instructions  Your physician has requested you wear a ZIO patch monitor for 7 days.  This is a single patch monitor. [employer] supplies one patch monitor per enrollment. Additional stickers are not available. Please do not apply patch if you will be having a Nuclear Stress Test,  Echocardiogram, Cardiac CT, MRI, or Chest Xray during the period you would be wearing the  monitor. The patch cannot be worn during these tests. You cannot remove and re-apply the  ZIO XT patch monitor.  Your ZIO patch monitor will be mailed 3 day USPS to your address on file. It may take 3-5 days  to receive your monitor after you have been enrolled.  Once you have received your monitor, please review the enclosed instructions. Your monitor  has already been registered assigning a specific monitor serial # to you.  Billing and Patient Assistance Program Information  We have supplied [employer] with any of your insurance information on file for billing purposes. [employer] offers a sliding scale Patient Assistance Program for patients that do not have  insurance, or whose insurance does not completely cover the cost of the ZIO monitor.  You must apply for the Patient Assistance Program to qualify for this discounted rate.  To apply, please call [employer] at [phone], select option 4, select option 2, ask to apply for  Patient Assistance Program. Meredeth Ide will ask your household income, and how many  people  are in your household. They will quote your out-of-pocket cost based on that information.  [employer] will also be able to set up a 9-month, interest-free payment plan if needed.  Applying the monitor   Shave hair from upper left chest.  Hold abrader disc by orange tab. Rub abrader in 40 strokes over the upper left chest as  indicated in your monitor instructions.  Clean area with 4 enclosed alcohol pads. Let dry.  Apply patch as indicated in monitor instructions. Patch will be placed under collarbone on left  side of chest with arrow pointing upward.  Rub patch adhesive wings for 2 minutes. Remove white label marked "1". Remove the white  label marked "2". Rub patch adhesive wings for 2 additional minutes.  While looking in a mirror, press and release button in center of patch. A small green light will  flash 3-4 times. This will be your only indicator that the monitor has been turned on.  Do not shower for the first 24 hours. You may shower after the first 24 hours.  Press the button if you feel a symptom. You will hear a small click. Record Date, Time and  Symptom in the Patient Logbook.  When you are ready to remove the patch, follow instructions on the last 2 pages of Patient  Logbook. Stick patch monitor onto the last page of Patient Logbook.  Place Patient Logbook in the blue and white box. Use locking tab on box and tape box closed  securely. The blue and  white box has prepaid postage on it. Please place it in the mailbox as  soon as possible. Your physician should have your test results approximately 7 days after the  monitor has been mailed back to [facility].  Call [facility] Customer Care at [phone] if you have questions regarding  your ZIO XT patch monitor. Call them immediately if you see an orange light blinking on your  monitor.  If your monitor falls off in less than 4 days, contact our Monitor department at [phone].  If your monitor becomes  loose or falls off after 4 days call [employer] at [phone] for  suggestions on securing your monitor    Follow-Up: At [facility], you and your health needs are our priority.  As part of our continuing mission to provide you with exceptional heart care, we have created designated Provider Care Teams.  These Care Teams include your primary Cardiologist (physician) and Advanced Practice Providers (APPs -  Physician Assistants and Nurse Practitioners) who all work together to provide you with the care you need, when you need it.  We recommend signing up for the patient portal called "MyChart".  Sign up information is provided on this After Visit Summary.  MyChart is used to connect with patients for Virtual Visits (Telemedicine).  Patients are able to view lab/test results, encounter notes, upcoming appointments, etc.  Non-urgent messages can be sent to your provider as well.   To learn more about what you can do with MyChart, go to [email or web address].

## 2023-02-28 ENCOUNTER — Ambulatory Visit: Payer: Commercial Managed Care - PPO | Admitting: Internal Medicine

## 2023-02-28 DIAGNOSIS — M059 Rheumatoid arthritis with rheumatoid factor, unspecified: Secondary | ICD-10-CM

## 2023-02-28 DIAGNOSIS — Z7952 Long term (current) use of systemic steroids: Secondary | ICD-10-CM

## 2023-02-28 DIAGNOSIS — M7022 Olecranon bursitis, left elbow: Secondary | ICD-10-CM

## 2023-02-28 DIAGNOSIS — C4359 Malignant melanoma of other part of trunk: Secondary | ICD-10-CM

## 2023-02-28 DIAGNOSIS — Z79899 Other long term (current) drug therapy: Secondary | ICD-10-CM

## 2023-02-28 LAB — BASIC METABOLIC PANEL
BUN/Creatinine Ratio: 13 (ref 10–24)
BUN: 14 mg/dL (ref 8–27)
CO2: 21 mmol/L (ref 20–29)
Calcium: 9.3 mg/dL (ref 8.6–10.2)
Chloride: 105 mmol/L (ref 96–106)
Creatinine, Ser: 1.12 mg/dL (ref 0.76–1.27)
Glucose: 107 mg/dL — ABNORMAL HIGH (ref 70–99)
Potassium: 4.8 mmol/L (ref 3.5–5.2)
Sodium: 140 mmol/L (ref 134–144)
eGFR: 72 mL/min/{1.73_m2} (ref >59)

## 2023-02-28 LAB — PRO B NATRIURETIC PEPTIDE: NT-Pro BNP: 175 pg/mL (ref 0–376)

## 2023-02-28 LAB — NMR, LIPOPROFILE
Cholesterol, Total: 165 mg/dL (ref 100–199)
HDL Particle Number: 28.1 umol/L — ABNORMAL LOW (ref >=30.5)
HDL-C: 49 mg/dL (ref >39)
LDL Particle Number: 1134 nmol/L — ABNORMAL HIGH (ref <1000)
LDL Size: 20.9 nmol (ref >20.5)
LDL-C (NIH Calc): 98 mg/dL (ref 0–99)
LP-IR Score: 40 (ref <=45)
Small LDL Particle Number: 446 nmol/L (ref <=527)
Triglycerides: 97 mg/dL (ref 0–149)

## 2023-02-28 LAB — HEMOGLOBIN A1C
Est. average glucose Bld gHb Est-mCnc: 134 mg/dL
Hgb A1c MFr Bld: 6.3 % — ABNORMAL HIGH (ref 4.8–5.6)

## 2023-03-09 ENCOUNTER — Other Ambulatory Visit (HOSPITAL_BASED_OUTPATIENT_CLINIC_OR_DEPARTMENT_OTHER): Payer: Self-pay

## 2023-03-09 NOTE — Telephone Encounter (Signed)
Called [facility] for copay card. Per rep, even though patient's [employer] and [employer] benefits are not coordinated, patient does not qualify for Humira copay card with any type of insurance coverage through Medicare  Provided patient's employer insurance to [facility] today via chat. They said to allow please allow 7-10 business days to completely verify.  Chesley Mires, PharmD, MPH, BCPS, CPP Clinical Pharmacist (Rheumatology and Pulmonology)

## 2023-03-14 ENCOUNTER — Emergency Department (HOSPITAL_COMMUNITY): Payer: Commercial Managed Care - PPO

## 2023-03-14 ENCOUNTER — Telehealth: Payer: Self-pay

## 2023-03-14 ENCOUNTER — Observation Stay (HOSPITAL_COMMUNITY)
Admission: EM | Admit: 2023-03-14 | Discharge: 2023-03-17 | Disposition: A | Discharge status: Home / Self Care | Payer: Commercial Managed Care - PPO | Attending: Family Medicine | Admitting: Family Medicine

## 2023-03-14 DIAGNOSIS — I639 Cerebral infarction, unspecified: Secondary | ICD-10-CM | POA: Diagnosis not present

## 2023-03-14 DIAGNOSIS — K921 Melena: Secondary | ICD-10-CM | POA: Diagnosis not present

## 2023-03-14 DIAGNOSIS — I6782 Cerebral ischemia: Secondary | ICD-10-CM | POA: Diagnosis not present

## 2023-03-14 DIAGNOSIS — Z79899 Other long term (current) drug therapy: Secondary | ICD-10-CM | POA: Diagnosis not present

## 2023-03-14 DIAGNOSIS — I4891 Unspecified atrial fibrillation: Secondary | ICD-10-CM | POA: Diagnosis not present

## 2023-03-14 DIAGNOSIS — E119 Type 2 diabetes mellitus without complications: Secondary | ICD-10-CM | POA: Insufficient documentation

## 2023-03-14 DIAGNOSIS — I509 Heart failure, unspecified: Secondary | ICD-10-CM | POA: Insufficient documentation

## 2023-03-14 DIAGNOSIS — K922 Gastrointestinal hemorrhage, unspecified: Secondary | ICD-10-CM

## 2023-03-14 DIAGNOSIS — R200 Anesthesia of skin: Principal | ICD-10-CM | POA: Insufficient documentation

## 2023-03-14 DIAGNOSIS — K625 Hemorrhage of anus and rectum: Secondary | ICD-10-CM | POA: Diagnosis not present

## 2023-03-14 DIAGNOSIS — Z7901 Long term (current) use of anticoagulants: Secondary | ICD-10-CM | POA: Insufficient documentation

## 2023-03-14 DIAGNOSIS — F419 Anxiety disorder, unspecified: Secondary | ICD-10-CM

## 2023-03-14 DIAGNOSIS — R531 Weakness: Principal | ICD-10-CM

## 2023-03-14 LAB — CBC
HCT: 45.9 % (ref 39.0–52.0)
Hemoglobin: 15.2 g/dL (ref 13.0–17.0)
MCH: 30.5 pg (ref 26.0–34.0)
MCHC: 33.1 g/dL (ref 30.0–36.0)
MCV: 92.2 fL (ref 80.0–100.0)
Platelets: 269 10*3/uL (ref 150–400)
RBC: 4.98 MIL/uL (ref 4.22–5.81)
RDW: 12.3 % (ref 11.5–15.5)
WBC: 11 10*3/uL — ABNORMAL HIGH (ref 4.0–10.5)
nRBC: 0 % (ref 0.0–0.2)

## 2023-03-14 LAB — I-STAT CHEM 8, ED
BUN: 12 mg/dL (ref 8–23)
Calcium, Ion: 1.13 mmol/L — ABNORMAL LOW (ref 1.15–1.40)
Chloride: 104 mmol/L (ref 98–111)
Creatinine, Ser: 0.9 mg/dL (ref 0.61–1.24)
Glucose, Bld: 158 mg/dL — ABNORMAL HIGH (ref 70–99)
HCT: 45 % (ref 39.0–52.0)
Hemoglobin: 15.3 g/dL (ref 13.0–17.0)
Potassium: 4 mmol/L (ref 3.5–5.1)
Sodium: 136 mmol/L (ref 135–145)
TCO2: 20 mmol/L — ABNORMAL LOW (ref 22–32)

## 2023-03-14 LAB — ETHANOL: Alcohol, Ethyl (B): <10 mg/dL (ref <10)

## 2023-03-14 LAB — COMPREHENSIVE METABOLIC PANEL
ALT: 23 U/L (ref 0–44)
AST: 24 U/L (ref 15–41)
Albumin: 3 g/dL — ABNORMAL LOW (ref 3.5–5.0)
Alkaline Phosphatase: 42 U/L (ref 38–126)
Anion gap: 15 (ref 5–15)
BUN: 9 mg/dL (ref 8–23)
CO2: 20 mmol/L — ABNORMAL LOW (ref 22–32)
Calcium: 9.3 mg/dL (ref 8.9–10.3)
Chloride: 104 mmol/L (ref 98–111)
Creatinine, Ser: 0.95 mg/dL (ref 0.61–1.24)
GFR, Estimated: >60 mL/min (ref >60)
Glucose, Bld: 153 mg/dL — ABNORMAL HIGH (ref 70–99)
Potassium: 4.3 mmol/L (ref 3.5–5.1)
Sodium: 139 mmol/L (ref 135–145)
Total Bilirubin: 0.7 mg/dL (ref <1.2)
Total Protein: 6.5 g/dL (ref 6.5–8.1)

## 2023-03-14 LAB — DIFFERENTIAL
Abs Immature Granulocytes: 0.08 10*3/uL — ABNORMAL HIGH (ref 0.00–0.07)
Basophils Absolute: 0 10*3/uL (ref 0.0–0.1)
Basophils Relative: 0 %
Eosinophils Absolute: 0 10*3/uL (ref 0.0–0.5)
Eosinophils Relative: 0 %
Immature Granulocytes: 1 %
Lymphocytes Relative: 5 %
Lymphs Abs: 0.6 10*3/uL — ABNORMAL LOW (ref 0.7–4.0)
Monocytes Absolute: 0.3 10*3/uL (ref 0.1–1.0)
Monocytes Relative: 3 %
Neutro Abs: 9.9 10*3/uL — ABNORMAL HIGH (ref 1.7–7.7)
Neutrophils Relative %: 91 %

## 2023-03-14 LAB — TYPE AND SCREEN
ABO/RH(D): O POS
Antibody Screen: NEGATIVE

## 2023-03-14 LAB — LIPASE, BLOOD: Lipase: 40 U/L (ref 11–51)

## 2023-03-14 LAB — PROTIME-INR
INR: 1.2 (ref 0.8–1.2)
Prothrombin Time: 15 s (ref 11.4–15.2)

## 2023-03-14 LAB — APTT: aPTT: 33 s (ref 24–36)

## 2023-03-14 LAB — CBG MONITORING, ED: Glucose-Capillary: 147 mg/dL — ABNORMAL HIGH (ref 70–99)

## 2023-03-14 MED ORDER — STROKE: EARLY STAGES OF RECOVERY BOOK
Freq: Once | Status: AC
Start: 2023-03-15 — End: 2023-03-15
  Filled 2023-03-14: qty 1

## 2023-03-14 MED ORDER — IOHEXOL 350 MG/ML SOLN
75.0000 mL | Freq: Once | INTRAVENOUS | Status: AC | PRN
  Administered 2023-03-14: 75 mL via INTRAVENOUS

## 2023-03-14 MED ORDER — DOFETILIDE 250 MCG PO CAPS
250.0000 ug | ORAL_CAPSULE | Freq: Two times a day (BID) | ORAL | Status: DC
  Administered 2023-03-14 – 2023-03-17 (×6): 250 ug via ORAL
  Filled 2023-03-14 (×8): qty 1

## 2023-03-14 MED ORDER — SODIUM CHLORIDE 0.9% FLUSH
3.0000 mL | Freq: Once | INTRAVENOUS | Status: AC
  Administered 2023-03-14: 3 mL via INTRAVENOUS

## 2023-03-14 NOTE — Hospital Course (Addendum)
Peter Mcmillan is a 67 y.o.male with a history of Afib/A flutter, HF, RA, tobacco use who was admitted to the [facility] medicine Teaching Service at [facility] for hematochezia and left sided weakness. His hospital course is detailed below:  Left sided numbness Transient left-sided numbness lasting a couple of hours but had resolved by admission, most likely TIA vs vasovagal response given it occurred during BM.  CT and MRI negative for CVA.  Neurology followed, restarted on Eliquis and started statin.  Echo obtained and read pending at time of discharge. No neurologic deficits at the time of discharge and cleared for discharge per Dr. Roda Shutters, Neurology.  Bright red blood per rectum: One episode of bright red blood mixed with BM day prior to admission.  CTAP negative for acute bleed and Hgb remained stable.  [facility] followed, recommended outpatient follow-up with colonoscopy.  No further episodes of bleeding during admission.  Other chronic conditions were medically managed with home medications and formulary alternatives as necessary (A fib, RA, CHF).  PCP Follow-up Recommendations: GI recommending repeat colonoscopy early 2025. Titrate statin as needed. Started prednisone 10 mg daily per Rheum note 11/20, will need follow up.

## 2023-03-14 NOTE — Consult Note (Signed)
Stroke Neurology Consultation Note  Consult Requested by: Dr. Earlene Plater  Reason for Consult: code stroke  Consult Date: 03/14/23   The history was obtained from the pt.  During history and examination, all items were able to obtain unless otherwise noted.  History of Present Illness:  Peter Mcmillan is a 67 y.o. Caucasian male with PMH of Afib on eliquis, CHF, RA and smoker presented for code stroke.   Per pt, around 1415 he had some RLQ pain and went to bathroom. Had bright red blood per rectum with large amount. He felt some dizziness but no LOC. He cleaned himself and called 911. On EMS arrival he also told EMS that he had left sided numbness and weakness at the same time with the bowel movement. Code stroke called. He denies any right sided symptoms, no vision or speech difficulty. He has no bleeding issue with eliquis in the past. He had last eliquis dose today 9am.   He has hx of afib s/p cardioversion but with recurrent afib now on eliquis and tikosyn. Also has CHF on entresto. RA on prednisone.   LSN: 1415 tPA Given: No: on eliquis, GIB IR: no, no LVO, low NIHSS mRS = 2  Past Medical History:  Diagnosis Date   Atrial flutter (HCC)    Bursitis of right elbow    Erectile dysfunction    Rheumatoid arthritis (HCC)    Tobacco abuse    Smokeless tobacco    Past Surgical History:  Procedure Laterality Date   ATRIAL FIBRILLATION ABLATION N/A 12/17/2020   Procedure: ATRIAL FIBRILLATION ABLATION;  Surgeon: Lanier Prude, MD;  Location: [facility] INVASIVE CV LAB;  Service: Cardiovascular;  Laterality: N/A;   BACK SURGERY  1999   CARDIOVERSION N/A 10/06/2020   Procedure: CARDIOVERSION;  Surgeon: Little Ishikawa, MD;  Location: [facility];  Service: Cardiovascular;  Laterality: N/A;   MELANOMA EXCISION  2020   TEE WITHOUT CARDIOVERSION N/A 10/06/2020   Procedure: TRANSESOPHAGEAL ECHOCARDIOGRAM (TEE);  Surgeon: Little Ishikawa, MD;  Location: [facility] ENDOSCOPY;  Service:  Cardiovascular;  Laterality: N/A;   TEE WITHOUT CARDIOVERSION N/A 12/17/2020   Procedure: TRANSESOPHAGEAL ECHOCARDIOGRAM (TEE);  Surgeon: Sande Rives, MD;  Location: [facility] INVASIVE CV LAB;  Service: Cardiovascular;  Laterality: N/A;    Family History  Problem Relation Age of Onset   Heart attack Father    Heart attack Mother    Dementia Mother    Healthy Sister    Healthy Son     Social History:  reports that he has never smoked. He has never been exposed to tobacco smoke. His smokeless tobacco use includes snuff. He reports current alcohol use. He reports that he does not use drugs.  Allergies:  Allergies  Allergen Reactions   Codeine Other (See Comments)    agitation   Other Hives, Swelling and Other (See Comments)    make feel weird; hives/swelling   Penicillin G Other (See Comments)   Penicillins Itching, Swelling and Rash    Reaction: 5 years ago    No current facility-administered medications on file prior to encounter.   Current Outpatient Medications on File Prior to Encounter  Medication Sig Dispense Refill   adalimumab (HUMIRA, 2 PEN,) 40 MG/0.4ML pen Inject 0.4 mLs (40 mg total) into the skin every 14 (fourteen) days. 1 kit - 2 pens 2 each 1   Cholecalciferol 100 MCG (4000 UT) CAPS Take 1 capsule (4,000 Units total) by mouth daily in the afternoon. 100 capsule 3  dofetilide (TIKOSYN) 250 MCG capsule TAKE ONE CAPSULE BY MOUTH TWICE DAILY 60 capsule 6   ELIQUIS 5 MG TABS tablet TAKE ONE TABLET BY MOUTH TWICE A DAY 60 tablet 5   ENTRESTO 24-26 MG TAKE ONE TABLET BY MOUTH TWICE (2) DAILY 180 tablet 3   folic acid (FOLVITE) 1 MG tablet Take 1 tablet (1 mg total) by mouth daily. 90 tablet 3   loratadine (CLARITIN) 10 MG tablet Take 10 mg by mouth daily.     Magnesium 200 MG TABS TAKE ONE TABLET BY MOUTH ONCE A DAY 100 tablet 3   metoprolol succinate (TOPROL-XL) 50 MG 24 hr tablet TAKE ONE TABLET BY MOUTH TWICE A DAY. TAKE WITH OR IMMEDIATELY FOLLOWING A MEAL. 180  tablet 2   omeprazole (PRILOSEC) 40 MG capsule Take 40 mg by mouth 2 (two) times daily.     potassium chloride (KLOR-CON) 10 MEQ tablet TAKE ONE TABLET BY MOUTH DAILY 90 tablet 2   predniSONE (DELTASONE) 10 MG tablet Take 2 tablets (20 mg total) by mouth daily with breakfast for 7 days, THEN 1 tablet (10 mg total) daily with breakfast. 44 tablet 0    Review of Systems: A full ROS was attempted today and was able to be performed.  Systems assessed include - Constitutional, Eyes, HENT, Respiratory, Cardiovascular, Gastrointestinal, Genitourinary, Integument/breast, Hematologic/lymphatic, Musculoskeletal, Neurological, Behavioral/Psych, Endocrine, Allergic/Immunologic - with pertinent responses as per HPI.  Physical Examination: Temp:  [97.5 F (36.4 C)] 97.5 F (36.4 C) (12/11 1609) Pulse Rate:  [91] 91 (12/11 1609) Resp:  [16] 16 (12/11 1609) BP: (136)/(99) 136/99 (12/11 1609) SpO2:  [100 %] 100 % (12/11 1609) Weight:  [102 kg] 102 kg (12/11 1548)  General - well nourished, well developed, in no apparent distress.    Ophthalmologic - fundi not visualized due to noncooperation.    Cardiovascular - regular rhythm and rate, not in afib.  Neuro - awake, alert, eyes open, orientated to age, place, time. No aphasia, fluent language, following all simple commands. Able to name and repeat. No gaze palsy, tracking bilaterally, visual field full. No facial droop. Tongue midline. RUE and RLE 5/5, LUE slight drift and LLE drift to bed after 5 sec. Sensation decreased on the left subjectively, b/l FTN intact, gait not tested.   NIH Stroke Scale  Level Of Consciousness 0=Alert; keenly responsive 1=Arouse to minor stimulation 2=Requires repeated stimulation to arouse or movements to pain 3=postures or unresponsive 0  LOC Questions to Month and Age 59=Answers both questions correctly 1=Answers one question correctly or dysarthria/intubated/trauma/language barrier 2=Answers neither question correctly  or aphasia 0  LOC Commands      -Open/Close eyes     -Open/close grip     -Pantomime commands if communication barrier 0=Performs both tasks correctly 1=Performs one task correctly 2=Performs neighter task correctly 0  Best Gaze     -Only assess horizontal gaze 0=Normal 1=Partial gaze palsy 2=Forced deviation, or total gaze paresis 0  Visual 0=No visual loss 1=Partial hemianopia 2=Complete hemianopia 3=Bilateral hemianopia (blind including cortical blindness) 0  Facial Palsy     -Use grimace if obtunded 0=Normal symmetrical movement 1=Minor paralysis (asymmetry) 2=Partial paralysis (lower face) 3=Complete paralysis (upper and lower face) 0  Motor  0=No drift for 10/5 seconds 1=Drift, but does not hit bed 2=Some antigravity effort, hits  bed 3=No effort against gravity, limb falls 4=No movement 0=Amputation/joint fusion Right Arm 0     Leg 0    Left Arm 1     Leg 1  Limb Ataxia     - FNT/HTS 0=Absent or does not understand or paralyzed or amputation/joint fusion 1=Present in one limb 2=Present in two limbs 0  Sensory 0=Normal 1=Mild to moderate sensory loss 2=Severe to total sensory loss or coma/unresponsive 1  Best Language 0=No aphasia, normal 1=Mild to moderate aphasia 2=Severe aphasia 3=Mute, global aphasia, or coma/unresponsive 0  Dysarthria 0=Normal 1=Mild to moderate 2=Severe, unintelligible or mute/anarthric 0=intubated/unable to test 0  Extinction/Neglect 0=No abnormality 1=visual/tactile/auditory/spatia/personal inattention/Extinction to bilateral simultaneous stimulation 2=Profound neglect/extinction more than 1 modality  0  Total   3      Data Reviewed: CT ANGIO HEAD NECK W WO CM (CODE STROKE)  Result Date: 03/14/2023 CLINICAL DATA:  Neuro deficit, acute, stroke suspected. Right arm and left leg weakness. EXAM: CT ANGIOGRAPHY HEAD AND NECK WITH AND WITHOUT CONTRAST TECHNIQUE: Multidetector CT imaging of the head and neck was performed using the  standard protocol during bolus administration of intravenous contrast. Multiplanar CT image reconstructions and MIPs were obtained to evaluate the vascular anatomy. Carotid stenosis measurements (when applicable) are obtained utilizing NASCET criteria, using the distal internal carotid diameter as the denominator. RADIATION DOSE REDUCTION: This exam was performed according to the departmental dose-optimization program which includes automated exposure control, adjustment of the mA and/or kV according to patient size and/or use of iterative reconstruction technique. CONTRAST:  75mL OMNIPAQUE IOHEXOL 350 MG/ML SOLN COMPARISON:  None Available. FINDINGS: CTA NECK FINDINGS Aortic arch: Standard branching with widely patent arch vessel origins. Right carotid system: Patent without evidence of stenosis, dissection, or significant atherosclerosis. Left carotid system: Patent with minimal atherosclerosis at the carotid bifurcation. No evidence of stenosis or dissection. Vertebral arteries: Patent without evidence of stenosis, dissection, or significant atherosclerosis. Mildly dominant left vertebral artery. Skeleton: Mild cervical spondylosis. Other neck: No evidence of cervical lymphadenopathy or mass. Upper chest: Clear lung apices. Review of the MIP images confirms the above findings CTA HEAD FINDINGS Anterior circulation: The internal carotid arteries are patent from skull base to carotid termini with minimal atherosclerosis bilaterally not resulting in a significant stenosis. ACAs and MCAs are patent without evidence of a proximal branch occlusion or significant proximal stenosis. No aneurysm is identified. Posterior circulation: The intracranial vertebral arteries are widely patent to the basilar. Patent PICA and SCA origins are visualized bilaterally. The basilar artery is widely patent. Posterior communicating arteries are diminutive or absent. Both PCAs are patent without evidence of a significant proximal  stenosis. No aneurysm is identified. Venous sinuses: Poorly evaluated due to arterial contrast timing. Anatomic variants: None. Review of the MIP images confirms the above findings These results were communicated to Dr. Roda Shutters at 4:03 pm on 03/14/2023 by text page via the [facility] messaging system. IMPRESSION: Minimal atherosclerosis in the head and neck without a large vessel occlusion or significant stenosis. Electronically Signed   By: Sebastian Ache M.D.   On: 03/14/2023 16:08   CT HEAD CODE STROKE WO CONTRAST  Result Date: 03/14/2023 CLINICAL DATA:  Code stroke.  Left leg weakness EXAM: CT HEAD WITHOUT CONTRAST TECHNIQUE: Contiguous axial images were obtained from the base of the skull through the vertex without intravenous contrast. RADIATION DOSE REDUCTION: This exam was performed according to the departmental dose-optimization program which includes automated exposure control, adjustment of the mA and/or kV according to patient size and/or use of iterative reconstruction technique. COMPARISON:  CT head 10/05/20 FINDINGS: Brain: No hemorrhage. No hydrocephalus. No extra-axial fluid collection. No mass effect. No mass lesion. No CT evidence of an acute  cortical infarct. Vascular: No hyperdense vessel or unexpected calcification. Skull: Normal. Negative for fracture or focal lesion. Sinuses/Orbits: No middle ear or mastoid effusion. Paranasal sinuses are clear. Orbits are unremarkable. Other: None. ASPECTS [facility] Stroke Program Early CT Score): 10 IMPRESSION: No hemorrhage or CT evidence of an acute cortical infarct. ASPECTS 10. Findings were paged to Dr. Roda Shutters on 03/14/23 at 3:55 PM. Electronically Signed   By: Lorenza Cambridge M.D.   On: 03/14/2023 15:57    Assessment: 67 y.o. male with PMH of Afib on eliquis, CHF, RA and smoker presented for LGIB and left sided numbness and weakness at 1415. Had last eliquis dose 9am today. CT no acute finding. CTA no LVO. Pt not TNK candidate due to on eliquis and now with GIB. Not  IR candidate given no LVO and low NIHSS. On exam he does have some functional component. Low suspicious for stroke but likely anxiety in the setting of GIB. Will check MRI but will need medicine admission for GIB. Hold off eliquis for now.   Stroke Risk Factors - atrial fibrillation  Plan: Medicine admission for LGIB work up in the setting of eliquis use Frequent neuro checks Telemetry monitoring MRI brain  Echocardiogram  UDS, fasting lipid panel and HgbA1C PT/OT/speech consult BP goal < 180/105 given on eliquis and with LGIB GI and DVT prophylaxis  Had eliquis this 9am, hold off now given GIB Stroke risk factor modification Discussed with Dr. Earlene Plater ED physician We will follow  Thank you for this consultation and allowing [place] to participate in the care of this patient.  Marvel Plan, MD PhD Stroke Neurology 03/14/2023 4:53 PM

## 2023-03-14 NOTE — Code Documentation (Signed)
Stroke Response Nurse Documentation Code Documentation  Peter Mcmillan is a 67 y.o. male arriving to [facility] as Code Stroke via [place]. History of aflutter, last dose Eliquis this AM 0900. LKW 1415, initially called EMS after having large bloody BM with dizziness. Once en route, reports left arm weakness/numbness during this episode.  Stroke team at the bedside on patient arrival. Labs drawn and patient cleared for CT by EDP. Patient to CT with team. NIH 3, see flowsheet for details. CT/CTA completed. No TNK d/t Eliquis. No thrombectomy as no LVO on imaging. Care Plan: NIH and vitals q2h. Bedside handoff with ED RN Genevie Cheshire.    Scarlette Slice K  Rapid Response RN

## 2023-03-14 NOTE — H&P (Cosign Needed Addendum)
Hospital Admission History and Physical Service Pager: [phone]  Patient name: Peter Mcmillan Medical record number: [id number] Date of Birth: 1955/04/30 Age: 67 y.o. Gender: male  Primary Care Provider: Wilfrid Lund, PA Consultants: Neurology, GI Code Status: Full which was confirmed with patient Preferred Emergency Contact:  Contact Information     Name Relation Home Work Mobile   Mcmillan,Peter Spouse [phone]     Willa Rough   [phone]      Other Contacts   None on File    Chief Complaint: hematochezia and left sided weakness   Assessment and Plan: Peter Mcmillan is a 67 y.o. male presenting with left-sided numbness and weakness with right lower quadrant pain and hematochezia.  Differential for left-sided numbness and weakness includes TIA, stroke, seizure.  CT angio head and neck demonstrated minimal atherosclerosis in the head and neck without large vessel occlusion or significant stenosis.  CT head did not show any evidence of hemorrhage or acute cortical infarct.  Neurology was consulted who have low suspicion for stroke but recommended MRI brain to rule out.  MRI did not show any acute intracranial abnormality. On exam in ED, patient did not have any focal deficits. Patient did not experience any head trauma or have any LOC. Given current exam and imaging, low suspicion for acute stroke. Likely anxiety related to hematochezia vs TIA. Will continue with neuro checks, neurology is following.   Differential for right lower quadrant pain with hematochezia includes diverticulitis or diverticulosis, hemorrhoids or anal fissure, colorectal cancer, appendiceal rupture. Patient had right lower quadrant pain after eating lunch with urge to defecate, then proceeded to have diarrhea and noticed some blood in toilet with some blood dripping into toilet. He also noticed blood on tissue when wiping. Reports does have some history of straining while defecating  and hemorrhoids, though has not had hemorrhoid for many years.  Patient does endorse having colonoscopy about 6 years ago which was normal, though unable to see this in records. CTA A/P negative for active GI bleed. No evidence of appendicitis. Does endorse some mild tenderness to palpation at right lower quadrant without rebound or guarding. On rectal exam, no external hemorrhoids noted. No frank blood seen. Hg 15.2 with stable vital signs. Differentia remains broad at this time. GI is aware of patient and will evaluate in AM. Given patient is stable, there is less concern for acute GI bleeding requiring immediate intervention. Will keep patient NPO at midnight.  Assessment & Plan Left-sided weakness Left sided weakness while having bowel movement. Resolved on exam, patient does not have residual weakness. Likely 2/2 to anxiety from BRBPR with bowel movement. CT angio head and neck and CT head negative. Neuro consulted, appreciate recs. Will obtain MRI for completeness. - Admit to [facility] Medicine Teaching service, Med-Tele floor, attending Dr. Linwood Dibbles  - Vital signs per floor - CCM - Neurochecks q4h - MRI brain pending - Echocardiogram - AM A1C, lipid panel - PT/OT/SLP consult - will hold Eliquis & DVT prophylaxis given bleed risk  Hematochezia Patient had one episode of BRBPR with BM this evening. No further events. Has followed with [facility] Dr. Dulce Sellar in the past - per patient. Unable to find records of colonoscopy. CT angio GI did not show active bleeding. Hg 15.4 with stable vital signs. GI consulted in ED, will evaluate patient in AM.  - GI consulted, appreciate recs - NPO @ [place] - AM CBC  Chronic and Stable Problems:  A fib/A flutter:  hold eliquis given concern for bleed, continue tikosyn BID, discuss metoprolol dosing with pharmacy Heart Failure with preserved EF: holding Entrestro for now, consider restarting in AM Rheumatoid Arthritis: hold home prednisone, last Humira dose  12/10 Tobacco Use: patient declined nicotine patch   FEN/GI: clear liquid diet, NPO @ midnight  VTE Prophylaxis: SCDs  Disposition: Med- Tele   History of Present Illness:  Peter Mcmillan is a 67 y.o. male presenting with RLQ pain and gross blood after BM with some left sided numbness and weakness with event.   During lunch, he got a very bad pain on the right side of his stomach. He proceeded to go to the bathroom and couldn't hold it. When he sent down, there was a gush and he noticed there was blood when he wiped. It was a mixture of diarrhea and blood and the bowl was dark red/black. It smelled horrible, not like diarrhea. When he sat back down, he had some blood dripping. He felt lightheaded and according to his wife looked pale. There was bright red blood when he wiped.  When he was sitting on the toilet, he hurt in the center of his abdomen and travelled up his chest and to his left arm. He then proceeded to feel numb on his left arm. He was able to move his arm. When he tried to stand up he got lightheaded and had weakness of his left arm. Now, he still feels weak but without numbness. Denies current abdominal pain and diarrhea. He has never had events like this before but he has had diarrhea off/on without abdominal pain. The diarrhea has been going on since he started Humira. His last dose of Humira was yesterday. No prior episodes of bleeding. He does have constipation sometimes. He had a hemorrhoid ~15 years ago which needed surgical intervention. There was some burning rectal pain when he stooled. He had a colonoscopy 5-10 years ago and states it was normal.   In the ED, code stroke was called due to left sided numbness and weakness. Neurology consulted who recommended observation and further imaging. CT head and CTA head/neck without acute abnormalities. CTA abd/pelvis did not show signs of active bleeding. Hg stable at 15.4. CMP grossly normal. EKG shows sinus arrhythmia, no A  fib/flutter.   Review Of Systems: denies headache, changes in vision or hearing. Denies chest pain or palpitations. Denies shortness of breathe. Denies nausea/vomiting. Denies fall or LOC.    Pertinent Past Medical History: A-fib/a flutter on Eliquis and tikosyn s/p cardiac ablation  in 2022 CHF on entresto  RA prednisone and humara  Remainder reviewed in history tab.   Pertinent Past Surgical History: Afib ablation 2022 Back surgery 1999 Remainder reviewed in history tab.   Pertinent Social History: Tobacco use: Yes, chewing tobacco x50 years Alcohol use: rarely Other Substance use: no Lives with wife  Pertinent Family History: Mother: heart attack, dementia Father: heart attack Remainder reviewed in history tab.   Important Outpatient Medications: Tikosyn 250 mcg twice daily Eliquis 5 mg twice daily Entresto twice daily  Toprol 50 mg twice daily Omeprazole 40 mg twice daily Potassium chloride 10 mEq daily Prednisone 10 mg daily Humira 40 mg every 14 days (last dose 03/13/23) Remainder reviewed in medication history.   Objective: BP (!) 133/92   Pulse 87   Temp (!) 97.5 F (36.4 C) (Axillary)   Resp 18   Wt 102 kg   SpO2 98%   BMI 26.67 kg/m  Exam: General: well appearing  male, NAD  HEENT: normocephalic, atraumatic, EOM intact. Oropharynx without erythema or exudate.   Cardiac: regular rate, irregular rhythm, no m/r/g Respiratory: CTAB, normal WOB on RA  GI: Soft, mildly distended, TTP at RLQ without rebound or guarding  Extremities: no peripheral edema at lower extremities  Neuro: A&Ox4, memory intact. No aphasia noted CRANIAL NERVES: II: Pupils equal and reactive  III, IV, VI: EOM intact, no gaze preference or deviation, no nystagmus. V: normal sensation in V1, V2, and V3 segments bilaterally VII: no asymmetry VIII: normal hearing to speech IX, X: normal palatal elevation, no uvular deviation XI: 5/5 head turn and 5/5 shoulder shrug bilaterally XII:  midline tongue protrusion MOTOR: 5/5 strength in right shoulder and upper extremity 5/5 strength in left shoulder and upper extremity  5/5 strength in right lower extremity 5/5 strength in left lower extremity  GAIT:  not tested Psych: Appropriate mood and affect  Labs:  CBC BMET  Recent Labs  Lab 03/14/23 1545 03/14/23 1549  WBC 11.0*  --   HGB 15.2 15.3  HCT 45.9 45.0  PLT 269  --    Recent Labs  Lab 03/14/23 1545 03/14/23 1549  NA 139 136  K 4.3 4.0  CL 104 104  CO2 20*  --   BUN 9 12  CREATININE 0.95 0.90  GLUCOSE 153* 158*  CALCIUM 9.3  --     Pertinent additional labs   Lipase: 40 u/l Ethanol: <10 mg/DK PT/INR: 15 seconds INR: 1.2 aPTT: 33 seconds   EKG: My own interpretation (not copied from electronic read)  Sinus arrhythmia, no ST elevations    Imaging Studies Performed:  CT Angio GI: No evidence of active GI bleeding or bowel ischemia.   Mild sigmoid diverticulosis, without evidence of diverticulitis.   CT Angio Head/neck: Minimal atherosclerosis in the head and neck without a large vessel occlusion or significant stenosis.  CT head: No hemorrhage or CT evidence of an acute cortical infarct.    Georg Ruddle Mahnoor, MD 03/14/2023, 8:17 PM PGY-1, [facility]  FPTS Intern pager: [phone], text pages welcome Secure chat group [facility] Teaching Service   I was personally present and performed or re-performed the history, physical exam and medical decision making activities of this service and have verified that the service and findings are accurately documented in the resident's note.  Shelby Mattocks, DO                  03/14/2023, 10:01 PM

## 2023-03-14 NOTE — ED Notes (Signed)
Patient transported to MRI 

## 2023-03-14 NOTE — Telephone Encounter (Signed)
Patient contacted the office and left a message stating he was calling about his new medication. Patient states he takes two injections. Patient states he does not have any left. Patient states he takes the medication every two weeks. After reviewing the patient's chart, Dr. Dimple Casey prescribed the patient Humira. Patient does the injection every fourteen days. The last Humira prescription was sent in on 02/27/2023 with one refill. Was going to advise the patient he should not need a refill yet. Left the patient a message to call the office back.

## 2023-03-14 NOTE — ED Triage Notes (Signed)
Pt arrives via [facility] for CODE STROKE. Pt reports that at 1415 he was having RLQ abd pain and went to have a BM. Pt reports gross hematochezia at that time and sudden onset of L sided weakness and decreased sensation.

## 2023-03-14 NOTE — ED Provider Notes (Signed)
[place] EMERGENCY DEPARTMENT AT [facility] Provider Note   CSN: [id number] Arrival date & time: 03/14/23  1543  An emergency department physician performed an initial assessment on this suspected stroke patient at 14.  History  Chief Complaint  Patient presents with   Code Stroke    Lankford Pari is a 67 y.o. male.  HPI 67 year old male history of atrial fibrillation atrial flutter on Eliquis and dofetilide, hyperlipidemia presenting for rectal bleeding and left-sided numbness.  Patient presented as code stroke.  He states earlier today he had sudden onset right lower quadrant abdominal pain.  He went to have a bowel movement have gross hematochezia.  Around the same time he developed some left-sided weakness and numbness to his arm and leg.  This has essentially resolved.  No headache.  No speech difficulties.  Still has some right lower quadrant Donnell pain.  He has a history of rectal bleeding.  No melena.  No vomiting.  No chest pain or shortness of breath.  No fevers.    Home Medications Prior to Admission medications   Medication Sig Start Date End Date Taking? Authorizing Provider  adalimumab (HUMIRA, 2 PEN,) 40 MG/0.4ML pen Inject 0.4 mLs (40 mg total) into the skin every 14 (fourteen) days. 1 kit - 2 pens 02/27/23  Yes Rice, Jamesetta Orleans, MD  dofetilide (TIKOSYN) 250 MCG capsule TAKE ONE CAPSULE BY MOUTH TWICE DAILY 08/09/22  Yes Fenton, Clint R, PA  ELIQUIS 5 MG TABS tablet TAKE ONE TABLET BY MOUTH TWICE A DAY 10/10/22  Yes Fenton, Clint R, PA  ENTRESTO 24-26 MG TAKE ONE TABLET BY MOUTH TWICE (2) DAILY 07/05/22  Yes Lanier Prude, MD  folic acid (FOLVITE) 1 MG tablet Take 1 tablet (1 mg total) by mouth daily. 07/24/22  Yes Rice, Jamesetta Orleans, MD  loratadine (CLARITIN) 10 MG tablet Take 10 mg by mouth daily as needed for allergies. 11/02/22  Yes [provider]  Magnesium 200 MG TABS TAKE ONE TABLET BY MOUTH ONCE A DAY 07/19/22  Yes Fenton,  Clint R, PA  metoprolol succinate (TOPROL-XL) 50 MG 24 hr tablet TAKE ONE TABLET BY MOUTH TWICE A DAY. TAKE WITH OR IMMEDIATELY FOLLOWING A MEAL. 07/05/22  Yes Fenton, Clint R, PA  omeprazole (PRILOSEC) 40 MG capsule Take 40 mg by mouth daily. 08/09/22  Yes [provider]  potassium chloride (KLOR-CON) 10 MEQ tablet TAKE ONE TABLET BY MOUTH DAILY 01/02/23  Yes Fenton, Clint R, PA  predniSONE (DELTASONE) 10 MG tablet Take 2 tablets (20 mg total) by mouth daily with breakfast for 7 days, THEN 1 tablet (10 mg total) daily with breakfast. Patient taking differently: Take 10mg  by mouth daily. 02/21/23 03/30/23 Yes Rice, Jamesetta Orleans, MD  VITAMIN D PO Take 2 tablets by mouth in the morning.   Yes [provider]  Cholecalciferol 100 MCG (4000 UT) CAPS Take 1 capsule (4,000 Units total) by mouth daily in the afternoon. Patient not taking: Reported on 03/14/2023 06/12/22   Pricilla Riffle, MD      Allergies    Codeine, Other, Penicillin g, and Penicillins    Review of Systems   Review of Systems Review of systems completed and notable as per HPI.  ROS otherwise negative.  Physical Exam Updated Vital Signs BP 120/79 (BP Location: Right Arm)   Pulse 65   Temp 98.1 F (36.7 C) (Oral)   Resp 14   Wt 102 kg   SpO2 95%   BMI 26.67 kg/m  Physical Exam Vitals and nursing note reviewed.  Constitutional:      General: He is not in acute distress.    Appearance: He is well-developed.  HENT:     Head: Normocephalic and atraumatic.     Nose: Nose normal.     Mouth/Throat:     Mouth: Mucous membranes are moist.  Eyes:     Extraocular Movements: Extraocular movements intact.     Conjunctiva/sclera: Conjunctivae normal.     Pupils: Pupils are equal, round, and reactive to light.  Cardiovascular:     Rate and Rhythm: Normal rate and regular rhythm.     Pulses: Normal pulses.     Heart sounds: Normal heart sounds. No murmur heard. Pulmonary:     Effort: Pulmonary effort is normal.  No respiratory distress.     Breath sounds: Normal breath sounds.  Abdominal:     Palpations: Abdomen is soft.     Tenderness: There is abdominal tenderness.     Comments: Mild right lower quadrant tenderness.  Musculoskeletal:        General: No swelling.     Cervical back: Neck supple.     Right lower leg: No edema.     Left lower leg: No edema.  Skin:    General: Skin is warm and dry.     Capillary Refill: Capillary refill takes less than 2 seconds.  Neurological:     General: No focal deficit present.     Mental Status: He is alert and oriented to person, place, and time. Mental status is at baseline.     Cranial Nerves: No cranial nerve deficit.     Sensory: No sensory deficit.     Motor: No weakness.  Psychiatric:        Mood and Affect: Mood normal.     ED Results / Procedures / Treatments   Labs (all labs ordered are listed, but only abnormal results are displayed) Labs Reviewed  CBC - Abnormal; Notable for the following components:      Result Value   WBC 11.0 (*)    All other components within normal limits  DIFFERENTIAL - Abnormal; Notable for the following components:   Neutro Abs 9.9 (*)    Lymphs Abs 0.6 (*)    Abs Immature Granulocytes 0.08 (*)    All other components within normal limits  COMPREHENSIVE METABOLIC PANEL - Abnormal; Notable for the following components:   CO2 20 (*)    Glucose, Bld 153 (*)    Albumin 3.0 (*)    All other components within normal limits  CBG MONITORING, ED - Abnormal; Notable for the following components:   Glucose-Capillary 147 (*)    All other components within normal limits  I-STAT CHEM 8, ED - Abnormal; Notable for the following components:   Glucose, Bld 158 (*)    Calcium, Ion 1.13 (*)    TCO2 20 (*)    All other components within normal limits  PROTIME-INR  APTT  ETHANOL  LIPASE, BLOOD  LIPID PANEL  HEMOGLOBIN A1C  CBC  OCCULT BLOOD X 1 CARD TO LAB, STOOL  MAGNESIUM  CBG MONITORING, ED  TYPE AND SCREEN     EKG EKG Interpretation Date/Time:  Wednesday March 14 2023 16:12:45 EST Ventricular Rate:  93 PR Interval:  136 QRS Duration:  91 QT Interval:  357 QTC Calculation: 444 R Axis:   11  Text Interpretation: Sinus arrhythmia Borderline T wave abnormalities Confirmed by Fulton Reek [id number]) on 03/14/2023 4:33:15 PM  Radiology MR BRAIN WO CONTRAST  Result Date: 03/14/2023 CLINICAL DATA:  Follow-up examination for stroke. EXAM: MRI HEAD WITHOUT CONTRAST TECHNIQUE: Multiplanar, multiecho pulse sequences of the brain and surrounding structures were obtained without intravenous contrast. COMPARISON:  CT from earlier the same day. FINDINGS: Brain: Cerebral volume within normal limits. Mild chronic microvascular ischemic disease for age. No evidence for acute or subacute ischemia. Gray-white matter differentiation maintained. No areas of chronic cortical infarction. No acute or chronic intracranial blood products. No mass lesion, midline shift or mass effect. No hydrocephalus or extra-axial fluid collection. Pituitary gland and suprasellar region within normal limits. Vascular: Major intracranial vascular flow voids are maintained. Skull and upper cervical spine: Craniocervical junction within normal limits. Bone marrow signal intensity normal. No scalp soft tissue abnormality. Sinuses/Orbits: Globes and orbital soft tissues within normal limits. Paranasal sinuses and mastoid air cells are clear. Other: None. IMPRESSION: 1. No acute intracranial abnormality. 2. Mild chronic microvascular ischemic disease for age. Electronically Signed   By: Rise Mu M.D.   On: 03/14/2023 21:06   CT ANGIO GI BLEED  Result Date: 03/14/2023 CLINICAL DATA:  Code stroke, acute mesenteric ischemia EXAM: CTA ABDOMEN AND PELVIS WITHOUT AND WITH CONTRAST TECHNIQUE: Multidetector CT imaging of the abdomen and pelvis was performed using the standard protocol during bolus administration of intravenous contrast.  Multiplanar reconstructed images and MIPs were obtained and reviewed to evaluate the vascular anatomy. RADIATION DOSE REDUCTION: This exam was performed according to the departmental dose-optimization program which includes automated exposure control, adjustment of the mA and/or kV according to patient size and/or use of iterative reconstruction technique. CONTRAST:  75mL OMNIPAQUE IOHEXOL 350 MG/ML SOLN COMPARISON:  CT abdomen/pelvis dated 08/14/2008 FINDINGS: VASCULAR Aorta: No evidence of abdominal aortic aneurysm or dissection. Patent. Mild atherosclerotic calcifications. Celiac: Patent. SMA: Patent. Renals: Patent bilaterally. IMA: Patent. Inflow: Patent bilaterally. Proximal Outflow: Patent bilaterally. Veins: Within normal limits.  Specifically, the SMV is patent. Review of the MIP images confirms the above findings. NON-VASCULAR Lower chest: Mild subpleural scarring/atelectasis lung bases. Hepatobiliary: Liver is within normal limits. Gallbladder is unremarkable. No intrahepatic or extrahepatic ductal dilatation. Pancreas: Within normal limits. Spleen: Within normal limits. Adrenals/Urinary Tract: Adrenal glands are within normal limits. Kidneys are within normal limits. Early excretory contrast related to prior imaging in the renal collecting systems, ureters, and bladder. No hydronephrosis. Bladder is within normal limits. Stomach/Bowel: Stomach is within normal limits. No evidence of bowel obstruction. Normal appendix (series 17/image 150). Mild sigmoid diverticulosis, without evidence of diverticulitis. No colonic wall thickening, inflammatory changes or mass is evident on CT. Following contrast administration, there is no intraluminal spillage of contrast to suggest active GI bleeding. No bowel wall thickening or pneumatosis to suggest bowel ischemia. No free air. Lymphatic: No suspicious abdominopelvic lymphadenopathy. Reproductive: Mild prostatomegaly, suggesting BPH. Other: No abdominopelvic ascites.  Musculoskeletal: Mild degenerative changes of the lower lumbar spine. IMPRESSION: No evidence of active GI bleeding or bowel ischemia. Mild sigmoid diverticulosis, without evidence of diverticulitis. Electronically Signed   By: Charline Bills M.D.   On: 03/14/2023 17:59   CT ANGIO HEAD NECK W WO CM (CODE STROKE)  Result Date: 03/14/2023 CLINICAL DATA:  Neuro deficit, acute, stroke suspected. Right arm and left leg weakness. EXAM: CT ANGIOGRAPHY HEAD AND NECK WITH AND WITHOUT CONTRAST TECHNIQUE: Multidetector CT imaging of the head and neck was performed using the standard protocol during bolus administration of intravenous contrast. Multiplanar CT image reconstructions and MIPs were obtained to evaluate the vascular anatomy. Carotid stenosis  measurements (when applicable) are obtained utilizing NASCET criteria, using the distal internal carotid diameter as the denominator. RADIATION DOSE REDUCTION: This exam was performed according to the departmental dose-optimization program which includes automated exposure control, adjustment of the mA and/or kV according to patient size and/or use of iterative reconstruction technique. CONTRAST:  75mL OMNIPAQUE IOHEXOL 350 MG/ML SOLN COMPARISON:  None Available. FINDINGS: CTA NECK FINDINGS Aortic arch: Standard branching with widely patent arch vessel origins. Right carotid system: Patent without evidence of stenosis, dissection, or significant atherosclerosis. Left carotid system: Patent with minimal atherosclerosis at the carotid bifurcation. No evidence of stenosis or dissection. Vertebral arteries: Patent without evidence of stenosis, dissection, or significant atherosclerosis. Mildly dominant left vertebral artery. Skeleton: Mild cervical spondylosis. Other neck: No evidence of cervical lymphadenopathy or mass. Upper chest: Clear lung apices. Review of the MIP images confirms the above findings CTA HEAD FINDINGS Anterior circulation: The internal carotid arteries  are patent from skull base to carotid termini with minimal atherosclerosis bilaterally not resulting in a significant stenosis. ACAs and MCAs are patent without evidence of a proximal branch occlusion or significant proximal stenosis. No aneurysm is identified. Posterior circulation: The intracranial vertebral arteries are widely patent to the basilar. Patent PICA and SCA origins are visualized bilaterally. The basilar artery is widely patent. Posterior communicating arteries are diminutive or absent. Both PCAs are patent without evidence of a significant proximal stenosis. No aneurysm is identified. Venous sinuses: Poorly evaluated due to arterial contrast timing. Anatomic variants: None. Review of the MIP images confirms the above findings These results were communicated to Dr. Roda Shutters at 4:03 pm on 03/14/2023 by text page via the [facility] messaging system. IMPRESSION: Minimal atherosclerosis in the head and neck without a large vessel occlusion or significant stenosis. Electronically Signed   By: Sebastian Ache M.D.   On: 03/14/2023 16:08   CT HEAD CODE STROKE WO CONTRAST  Result Date: 03/14/2023 CLINICAL DATA:  Code stroke.  Left leg weakness EXAM: CT HEAD WITHOUT CONTRAST TECHNIQUE: Contiguous axial images were obtained from the base of the skull through the vertex without intravenous contrast. RADIATION DOSE REDUCTION: This exam was performed according to the departmental dose-optimization program which includes automated exposure control, adjustment of the mA and/or kV according to patient size and/or use of iterative reconstruction technique. COMPARISON:  CT head 10/05/20 FINDINGS: Brain: No hemorrhage. No hydrocephalus. No extra-axial fluid collection. No mass effect. No mass lesion. No CT evidence of an acute cortical infarct. Vascular: No hyperdense vessel or unexpected calcification. Skull: Normal. Negative for fracture or focal lesion. Sinuses/Orbits: No middle ear or mastoid effusion. Paranasal sinuses are  clear. Orbits are unremarkable. Other: None. ASPECTS [facility] Stroke Program Early CT Score): 10 IMPRESSION: No hemorrhage or CT evidence of an acute cortical infarct. ASPECTS 10. Findings were paged to Dr. Roda Shutters on 03/14/23 at 3:55 PM. Electronically Signed   By: Lorenza Cambridge M.D.   On: 03/14/2023 15:57    Procedures Procedures    Medications Ordered in ED Medications   stroke: early stages of recovery book (has no administration in time range)  dofetilide (TIKOSYN) capsule 250 mcg (250 mcg Oral Given 03/14/23 2228)  sodium chloride flush (NS) 0.9 % injection 3 mL (3 mLs Intravenous Given 03/14/23 1930)  iohexol (OMNIPAQUE) 350 MG/ML injection 75 mL (75 mLs Intravenous Contrast Given 03/14/23 1553)  iohexol (OMNIPAQUE) 350 MG/ML injection 75 mL (75 mLs Intravenous Contrast Given 03/14/23 1700)    ED Course/ Medical Decision Making/ A&P Clinical Course as of 03/15/23  8469  Wed Mar 14, 2023  1901 CT scan unremarkable.  No additional bleeding.  I think needs admission for MRI for workup of his left-sided symptoms and GI bleeding.  GI paged he follows with [facility]. [JD]  1931 Discussed with internal medicine for admission. [JD]    Clinical Course User Index [JD] Laurence Spates, MD                                 Medical Decision Making Amount and/or Complexity of Data Reviewed Labs: ordered. Radiology: ordered.  Risk Prescription drug management. Decision regarding hospitalization.   Medical Decision Making:   Woodruff Degolier is a 67 y.o. male who presented to the ED today with abdominal pain, rectal bleeding, left-sided weakness and numbness.  He is healing okay stable.  Well-appearing.  Evaluated as code stroke with neurology initially.  He had CT head CT a head and neck which did not show any signs of acute stroke or LVO.  Neurology suspects this is not primarily neurologic and may be related to his rectal bleeding which I agree with.  He had has had rectal bleeding with  some right lower quadrant abdominal pain.  Obtain CT scan evaluate for acute pathology in the abdomen and possible source of his bleeding.  I do not see any prior colonoscopy or history of rectal bleeding and he also denies this.  He is anticoagulated.  His EKG shows no acute ischemia, appears to be in sinus rhythm.   Patient placed on continuous vitals and telemetry monitoring while in ED which was reviewed periodically.  Reviewed and confirmed nursing documentation for past medical history, family history, social history.  Reassessment and Plan:   Patient remained stable.  No additional bleeding.  CT scan without acute findings.  Discussed with [facility] who will see tomorrow.  Discussed with internal medicine team and he was admitted for MRI as well as further GI workup.   Patient's presentation is most consistent with acute complicated illness / injury requiring diagnostic workup.           Final Clinical Impression(s) / ED Diagnoses Final diagnoses:  Left-sided weakness  Hematochezia    Rx / DC Orders ED Discharge Orders     None         Laurence Spates, MD 03/15/23 [id number]

## 2023-03-15 ENCOUNTER — Other Ambulatory Visit (HOSPITAL_COMMUNITY): Payer: Commercial Managed Care - PPO

## 2023-03-15 ENCOUNTER — Other Ambulatory Visit (HOSPITAL_COMMUNITY): Payer: Self-pay | Admitting: Physician Assistant

## 2023-03-15 ENCOUNTER — Other Ambulatory Visit: Payer: Self-pay

## 2023-03-15 ENCOUNTER — Encounter (HOSPITAL_COMMUNITY): Payer: Self-pay | Admitting: Family Medicine

## 2023-03-15 DIAGNOSIS — K921 Melena: Secondary | ICD-10-CM

## 2023-03-15 DIAGNOSIS — K625 Hemorrhage of anus and rectum: Secondary | ICD-10-CM | POA: Diagnosis not present

## 2023-03-15 DIAGNOSIS — R531 Weakness: Secondary | ICD-10-CM | POA: Diagnosis not present

## 2023-03-15 DIAGNOSIS — F419 Anxiety disorder, unspecified: Secondary | ICD-10-CM | POA: Diagnosis not present

## 2023-03-15 DIAGNOSIS — R200 Anesthesia of skin: Secondary | ICD-10-CM | POA: Diagnosis not present

## 2023-03-15 DIAGNOSIS — K922 Gastrointestinal hemorrhage, unspecified: Secondary | ICD-10-CM | POA: Diagnosis not present

## 2023-03-15 LAB — CBC
HCT: 43.3 % (ref 39.0–52.0)
HCT: 43.1 % (ref 39.0–52.0)
Hemoglobin: 14.6 g/dL (ref 13.0–17.0)
Hemoglobin: 14.8 g/dL (ref 13.0–17.0)
MCH: 30.5 pg (ref 26.0–34.0)
MCH: 30.8 pg (ref 26.0–34.0)
MCHC: 33.7 g/dL (ref 30.0–36.0)
MCHC: 34.3 g/dL (ref 30.0–36.0)
MCV: 90.6 fL (ref 80.0–100.0)
MCV: 89.6 fL (ref 80.0–100.0)
Platelets: 257 10*3/uL (ref 150–400)
Platelets: 259 10*3/uL (ref 150–400)
RBC: 4.78 MIL/uL (ref 4.22–5.81)
RBC: 4.81 MIL/uL (ref 4.22–5.81)
RDW: 12.2 % (ref 11.5–15.5)
RDW: 12.4 % (ref 11.5–15.5)
WBC: 7.7 10*3/uL (ref 4.0–10.5)
WBC: 6.7 10*3/uL (ref 4.0–10.5)
nRBC: 0 % (ref 0.0–0.2)
nRBC: 0 % (ref 0.0–0.2)

## 2023-03-15 LAB — LIPID PANEL
Cholesterol: 125 mg/dL (ref 0–200)
HDL: 36 mg/dL — ABNORMAL LOW (ref >40)
LDL Cholesterol: 81 mg/dL (ref 0–99)
Total CHOL/HDL Ratio: 3.5 {ratio}
Triglycerides: 42 mg/dL (ref <150)
VLDL: 8 mg/dL (ref 0–40)

## 2023-03-15 LAB — POC OCCULT BLOOD, ED: Fecal Occult Bld: NEGATIVE

## 2023-03-15 LAB — BASIC METABOLIC PANEL
Anion gap: 11 (ref 5–15)
BUN: 10 mg/dL (ref 8–23)
CO2: 21 mmol/L — ABNORMAL LOW (ref 22–32)
Calcium: 8.7 mg/dL — ABNORMAL LOW (ref 8.9–10.3)
Chloride: 104 mmol/L (ref 98–111)
Creatinine, Ser: 0.99 mg/dL (ref 0.61–1.24)
GFR, Estimated: >60 mL/min (ref >60)
Glucose, Bld: 89 mg/dL (ref 70–99)
Potassium: 4.3 mmol/L (ref 3.5–5.1)
Sodium: 136 mmol/L (ref 135–145)

## 2023-03-15 LAB — MAGNESIUM: Magnesium: 2 mg/dL (ref 1.7–2.4)

## 2023-03-15 LAB — HEMOGLOBIN A1C
Hgb A1c MFr Bld: 5.8 % — ABNORMAL HIGH (ref 4.8–5.6)
Mean Plasma Glucose: 119.76 mg/dL

## 2023-03-15 MED ORDER — PANTOPRAZOLE SODIUM 40 MG PO TBEC
40.0000 mg | DELAYED_RELEASE_TABLET | Freq: Every day | ORAL | Status: DC
  Administered 2023-03-15 – 2023-03-17 (×3): 40 mg via ORAL
  Filled 2023-03-15 (×3): qty 1

## 2023-03-15 MED ORDER — ATORVASTATIN CALCIUM 10 MG PO TABS
20.0000 mg | ORAL_TABLET | Freq: Every day | ORAL | Status: DC
  Administered 2023-03-15 – 2023-03-17 (×3): 20 mg via ORAL
  Filled 2023-03-15 (×3): qty 2

## 2023-03-15 MED ORDER — SACUBITRIL-VALSARTAN 24-26 MG PO TABS
1.0000 | ORAL_TABLET | Freq: Two times a day (BID) | ORAL | Status: DC
  Administered 2023-03-15 – 2023-03-17 (×5): 1 via ORAL
  Filled 2023-03-15 (×5): qty 1

## 2023-03-15 MED ORDER — ACETAMINOPHEN 325 MG PO TABS
650.0000 mg | ORAL_TABLET | Freq: Once | ORAL | Status: AC
  Administered 2023-03-15: 650 mg via ORAL
  Filled 2023-03-15: qty 2

## 2023-03-15 MED ORDER — METOPROLOL SUCCINATE ER 50 MG PO TB24
50.0000 mg | ORAL_TABLET | Freq: Two times a day (BID) | ORAL | Status: DC
  Administered 2023-03-15 – 2023-03-17 (×5): 50 mg via ORAL
  Filled 2023-03-15: qty 1
  Filled 2023-03-15: qty 2
  Filled 2023-03-15 (×3): qty 1

## 2023-03-15 MED ORDER — PREDNISONE 5 MG PO TABS
10.0000 mg | ORAL_TABLET | Freq: Every day | ORAL | Status: DC
  Administered 2023-03-16 – 2023-03-17 (×2): 10 mg via ORAL
  Filled 2023-03-15 (×3): qty 2

## 2023-03-15 MED ORDER — NICOTINE 14 MG/24HR TD PT24
14.0000 mg | MEDICATED_PATCH | Freq: Every day | TRANSDERMAL | Status: DC
Start: 2023-03-15 — End: 2023-03-17
  Administered 2023-03-15 – 2023-03-17 (×3): 14 mg via TRANSDERMAL
  Filled 2023-03-15 (×3): qty 1

## 2023-03-15 NOTE — Assessment & Plan Note (Addendum)
No focal neurological deficit.  CT angio head and neck and CT head negative.  MRI head negative.  Unclear etiology, possible TIA, but more likely anxiety in setting of BRBPR. -Neurochecks q4h -f/u TTE -PT/OT/SLP eval and treat -Appreciate neurology recs:  -BP goal <180/105 given on Eliquis with concern for LGIB

## 2023-03-15 NOTE — Assessment & Plan Note (Addendum)
Single episode BRBPR yesterday, low concern for active LGIB with stable Hgb overnight.  Follows with [facility], Dr. Dulce Sellar, and confirms colonoscopy 6 years ago. -Tolerated clear liquid diet, now advancing to full liquids -Hold Eliquis pending GI eval -PM CBC @ 1400 -Appreciate [facility] recs, per Secure Chat with Dr. Bosie Clos can discharge with outpatient colonoscopy early 2025

## 2023-03-15 NOTE — Progress Notes (Signed)
STROKE TEAM PROGRESS NOTE   SUBJECTIVE (INTERVAL HISTORY) His wife is at the bedside.  Overall his condition is completely resolved.  No neurodeficits.  GI on board for lower GI bleeding, okay to advance diet.  So far no further GI bleeding.   OBJECTIVE Temp:  [97.9 F (36.6 C)-98.6 F (37 C)] 98.3 F (36.8 C) (12/12 1521) Pulse Rate:  [61-96] 81 (12/12 1521) Cardiac Rhythm: Other (Comment) (12/12 1356) Resp:  [14-26] 18 (12/12 1521) BP: (116-157)/(79-102) 116/84 (12/12 1521) SpO2:  [95 %-100 %] 97 % (12/12 1521) Weight:  [97.2 kg] 97.2 kg (12/12 1318)  Recent Labs  Lab 03/14/23 1544  GLUCAP 147*   Recent Labs  Lab 03/14/23 1545 03/14/23 1549 03/15/23 0442  NA 139 136 136  K 4.3 4.0 4.3  CL 104 104 104  CO2 20*  --  21*  GLUCOSE 153* 158* 89  BUN 9 12 10   CREATININE 0.95 0.90 0.99  CALCIUM 9.3  --  8.7*  MG  --   --  2.0   Recent Labs  Lab 03/14/23 1545  AST 24  ALT 23  ALKPHOS 42  BILITOT 0.7  PROT 6.5  ALBUMIN 3.0*   Recent Labs  Lab 03/14/23 1545 03/14/23 1549 03/15/23 0442 03/15/23 1426  WBC 11.0*  --  7.7 6.7  NEUTROABS 9.9*  --   --   --   HGB 15.2 15.3 14.6 14.8  HCT 45.9 45.0 43.3 43.1  MCV 92.2  --  90.6 89.6  PLT 269  --  257 259   No results for input(s): "CKTOTAL", "CKMB", "CKMBINDEX", "TROPONINI" in the last 168 hours. Recent Labs    03/14/23 1545  LABPROT 15.0  INR 1.2   No results for input(s): "COLORURINE", "LABSPEC", "PHURINE", "GLUCOSEU", "HGBUR", "BILIRUBINUR", "KETONESUR", "PROTEINUR", "UROBILINOGEN", "NITRITE", "LEUKOCYTESUR" in the last 72 hours.  Invalid input(s): "APPERANCEUR"     Component Value Date/Time   CHOL 125 03/15/2023 0442   TRIG 42 03/15/2023 0442   HDL 36 (L) 03/15/2023 0442   CHOLHDL 3.5 03/15/2023 0442   VLDL 8 03/15/2023 0442   LDLCALC 81 03/15/2023 0442   Lab Results  Component Value Date   HGBA1C 5.8 (H) 03/15/2023   No results found for: "LABOPIA", "COCAINSCRNUR", "LABBENZ", "AMPHETMU",  "THCU", "LABBARB"  Recent Labs  Lab 03/14/23 1545  ETH <10    I have personally reviewed the radiological images below and agree with the radiology interpretations.  MR BRAIN WO CONTRAST Result Date: 03/14/2023 CLINICAL DATA:  Follow-up examination for stroke. EXAM: MRI HEAD WITHOUT CONTRAST TECHNIQUE: Multiplanar, multiecho pulse sequences of the brain and surrounding structures were obtained without intravenous contrast. COMPARISON:  CT from earlier the same day. FINDINGS: Brain: Cerebral volume within normal limits. Mild chronic microvascular ischemic disease for age. No evidence for acute or subacute ischemia. Gray-white matter differentiation maintained. No areas of chronic cortical infarction. No acute or chronic intracranial blood products. No mass lesion, midline shift or mass effect. No hydrocephalus or extra-axial fluid collection. Pituitary gland and suprasellar region within normal limits. Vascular: Major intracranial vascular flow voids are maintained. Skull and upper cervical spine: Craniocervical junction within normal limits. Bone marrow signal intensity normal. No scalp soft tissue abnormality. Sinuses/Orbits: Globes and orbital soft tissues within normal limits. Paranasal sinuses and mastoid air cells are clear. Other: None. IMPRESSION: 1. No acute intracranial abnormality. 2. Mild chronic microvascular ischemic disease for age. Electronically Signed   By: Rise Mu M.D.   On: 03/14/2023 21:06   CT  ANGIO GI BLEED Result Date: 03/14/2023 CLINICAL DATA:  Code stroke, acute mesenteric ischemia EXAM: CTA ABDOMEN AND PELVIS WITHOUT AND WITH CONTRAST TECHNIQUE: Multidetector CT imaging of the abdomen and pelvis was performed using the standard protocol during bolus administration of intravenous contrast. Multiplanar reconstructed images and MIPs were obtained and reviewed to evaluate the vascular anatomy. RADIATION DOSE REDUCTION: This exam was performed according to the  departmental dose-optimization program which includes automated exposure control, adjustment of the mA and/or kV according to patient size and/or use of iterative reconstruction technique. CONTRAST:  75mL OMNIPAQUE IOHEXOL 350 MG/ML SOLN COMPARISON:  CT abdomen/pelvis dated 08/14/2008 FINDINGS: VASCULAR Aorta: No evidence of abdominal aortic aneurysm or dissection. Patent. Mild atherosclerotic calcifications. Celiac: Patent. SMA: Patent. Renals: Patent bilaterally. IMA: Patent. Inflow: Patent bilaterally. Proximal Outflow: Patent bilaterally. Veins: Within normal limits.  Specifically, the SMV is patent. Review of the MIP images confirms the above findings. NON-VASCULAR Lower chest: Mild subpleural scarring/atelectasis lung bases. Hepatobiliary: Liver is within normal limits. Gallbladder is unremarkable. No intrahepatic or extrahepatic ductal dilatation. Pancreas: Within normal limits. Spleen: Within normal limits. Adrenals/Urinary Tract: Adrenal glands are within normal limits. Kidneys are within normal limits. Early excretory contrast related to prior imaging in the renal collecting systems, ureters, and bladder. No hydronephrosis. Bladder is within normal limits. Stomach/Bowel: Stomach is within normal limits. No evidence of bowel obstruction. Normal appendix (series 17/image 150). Mild sigmoid diverticulosis, without evidence of diverticulitis. No colonic wall thickening, inflammatory changes or mass is evident on CT. Following contrast administration, there is no intraluminal spillage of contrast to suggest active GI bleeding. No bowel wall thickening or pneumatosis to suggest bowel ischemia. No free air. Lymphatic: No suspicious abdominopelvic lymphadenopathy. Reproductive: Mild prostatomegaly, suggesting BPH. Other: No abdominopelvic ascites. Musculoskeletal: Mild degenerative changes of the lower lumbar spine. IMPRESSION: No evidence of active GI bleeding or bowel ischemia. Mild sigmoid diverticulosis,  without evidence of diverticulitis. Electronically Signed   By: Charline Bills M.D.   On: 03/14/2023 17:59   CT ANGIO HEAD NECK W WO CM (CODE STROKE) Result Date: 03/14/2023 CLINICAL DATA:  Neuro deficit, acute, stroke suspected. Right arm and left leg weakness. EXAM: CT ANGIOGRAPHY HEAD AND NECK WITH AND WITHOUT CONTRAST TECHNIQUE: Multidetector CT imaging of the head and neck was performed using the standard protocol during bolus administration of intravenous contrast. Multiplanar CT image reconstructions and MIPs were obtained to evaluate the vascular anatomy. Carotid stenosis measurements (when applicable) are obtained utilizing NASCET criteria, using the distal internal carotid diameter as the denominator. RADIATION DOSE REDUCTION: This exam was performed according to the departmental dose-optimization program which includes automated exposure control, adjustment of the mA and/or kV according to patient size and/or use of iterative reconstruction technique. CONTRAST:  75mL OMNIPAQUE IOHEXOL 350 MG/ML SOLN COMPARISON:  None Available. FINDINGS: CTA NECK FINDINGS Aortic arch: Standard branching with widely patent arch vessel origins. Right carotid system: Patent without evidence of stenosis, dissection, or significant atherosclerosis. Left carotid system: Patent with minimal atherosclerosis at the carotid bifurcation. No evidence of stenosis or dissection. Vertebral arteries: Patent without evidence of stenosis, dissection, or significant atherosclerosis. Mildly dominant left vertebral artery. Skeleton: Mild cervical spondylosis. Other neck: No evidence of cervical lymphadenopathy or mass. Upper chest: Clear lung apices. Review of the MIP images confirms the above findings CTA HEAD FINDINGS Anterior circulation: The internal carotid arteries are patent from skull base to carotid termini with minimal atherosclerosis bilaterally not resulting in a significant stenosis. ACAs and MCAs are patent without  evidence of  a proximal branch occlusion or significant proximal stenosis. No aneurysm is identified. Posterior circulation: The intracranial vertebral arteries are widely patent to the basilar. Patent PICA and SCA origins are visualized bilaterally. The basilar artery is widely patent. Posterior communicating arteries are diminutive or absent. Both PCAs are patent without evidence of a significant proximal stenosis. No aneurysm is identified. Venous sinuses: Poorly evaluated due to arterial contrast timing. Anatomic variants: None. Review of the MIP images confirms the above findings These results were communicated to Dr. Roda Shutters at 4:03 pm on 03/14/2023 by text page via the [facility] messaging system. IMPRESSION: Minimal atherosclerosis in the head and neck without a large vessel occlusion or significant stenosis. Electronically Signed   By: Sebastian Ache M.D.   On: 03/14/2023 16:08   CT HEAD CODE STROKE WO CONTRAST Result Date: 03/14/2023 CLINICAL DATA:  Code stroke.  Left leg weakness EXAM: CT HEAD WITHOUT CONTRAST TECHNIQUE: Contiguous axial images were obtained from the base of the skull through the vertex without intravenous contrast. RADIATION DOSE REDUCTION: This exam was performed according to the departmental dose-optimization program which includes automated exposure control, adjustment of the mA and/or kV according to patient size and/or use of iterative reconstruction technique. COMPARISON:  CT head 10/05/20 FINDINGS: Brain: No hemorrhage. No hydrocephalus. No extra-axial fluid collection. No mass effect. No mass lesion. No CT evidence of an acute cortical infarct. Vascular: No hyperdense vessel or unexpected calcification. Skull: Normal. Negative for fracture or focal lesion. Sinuses/Orbits: No middle ear or mastoid effusion. Paranasal sinuses are clear. Orbits are unremarkable. Other: None. ASPECTS [facility] Stroke Program Early CT Score): 10 IMPRESSION: No hemorrhage or CT evidence of an acute cortical  infarct. ASPECTS 10. Findings were paged to Dr. Roda Shutters on 03/14/23 at 3:55 PM. Electronically Signed   By: Lorenza Cambridge M.D.   On: 03/14/2023 15:57     PHYSICAL EXAM  Temp:  [97.9 F (36.6 C)-98.6 F (37 C)] 98.3 F (36.8 C) (12/12 1521) Pulse Rate:  [61-96] 81 (12/12 1521) Resp:  [14-26] 18 (12/12 1521) BP: (116-157)/(79-102) 116/84 (12/12 1521) SpO2:  [95 %-100 %] 97 % (12/12 1521) Weight:  [97.2 kg] 97.2 kg (12/12 1318)  General - Well nourished, well developed, in no apparent distress.  Ophthalmologic - fundi not visualized due to noncooperation.  Cardiovascular - Regular rhythm and rate.  Mental Status -  Level of arousal and orientation to time, place, and person were intact. Language including expression, naming, repetition, comprehension was assessed and found intact. Fund of Knowledge was assessed and was intact.  Cranial Nerves II - XII - II - Visual field intact OU. III, IV, VI - Extraocular movements intact. V - Facial sensation intact bilaterally. VII - Facial movement intact bilaterally. VIII - Hearing & vestibular intact bilaterally. X - Palate elevates symmetrically. XI - Chin turning & shoulder shrug intact bilaterally. XII - Tongue protrusion intact.  Motor Strength - The patient's strength was normal in all extremities and pronator drift was absent.  Bulk was normal and fasciculations were absent.   Motor Tone - Muscle tone was assessed at the neck and appendages and was normal.  Reflexes - The patient's reflexes were symmetrical in all extremities and he had no pathological reflexes.  Sensory - Light touch, temperature/pinprick were assessed and were symmetrical.    Coordination - The patient had normal movements in the hands and feet with no ataxia or dysmetria.  Tremor was absent.  Gait and Station - deferred.   ASSESSMENT/PLAN Mr. Peter Mcmillan [facility]  is a 67 y.o. male with history of Afib on eliquis, CHF, RA and smoker admitted for lower GI  bleeding and left-sided numbness and weakness. No tPA given due to on Eliquis.    Anxiety in the setting of lower GI bleeding CT no acute abnormality CT head and neck unremarkable MRI no acute infarct 2D Echo pending LDL 81 HgbA1c 5.8 SCDs for VTE prophylaxis Eliquis prior to admission, now on No antithrombotic given lower GI bleeding.  Recommend resume Eliquis once cleared by GI, or consider Watchman device if felt high risk from GI standpoint.  Ongoing aggressive stroke risk factor management Therapy recommendations: None Disposition: Pending  Afib s/p cardioversion Intermittent afib on eliquis PTA Eliquis on hold now  Follows with Dr. Tenny Craw and Dr. Lalla Brothers Pending GI clearance, can restart eliquis. Otherwise, may consider watchman device with Dr. Lalla Brothers.   LGIB One time event Hb stable GI on board CT GI blood negative Advance diet as able  Hypertension Stable Long term BP goal normotensive  Hyperlipidemia Home meds:  none  LDL 81, goal < 70 Now on lipitor 20 Continue statin at discharge  Tobacco abuse Current chewing tobacco Cessation counseling provided Pt is willing to quit  Other Stroke Risk Factors Advanced age CHF on entresto, echo pending  Other Active Problems RA on prednisone  Hospital day # 1  Neurology will sign off. Please call with questions. No neuro follow up needed at this time. Thanks for the consult.   Marvel Plan, MD PhD Stroke Neurology 03/15/2023 5:22 PM    To contact Stroke Continuity provider, please refer to [email or web address]. After hours, contact General Neurology

## 2023-03-15 NOTE — Plan of Care (Signed)
  Problem: Education: Goal: Knowledge of disease or condition will improve Outcome: Progressing   Problem: Ischemic Stroke/TIA Tissue Perfusion: Goal: Complications of ischemic stroke/TIA will be minimized Outcome: Progressing   Problem: Self-Care: Goal: Verbalization of feelings and concerns over difficulty with self-care will improve Outcome: Progressing   Problem: Nutrition: Goal: Risk of aspiration will decrease Outcome: Progressing   Problem: Clinical Measurements: Goal: Diagnostic test results will improve Outcome: Progressing   Problem: Activity: Goal: Risk for activity intolerance will decrease Outcome: Progressing   Problem: Coping: Goal: Level of anxiety will decrease Outcome: Progressing   Problem: Pain Management: Goal: General experience of comfort will improve Outcome: Progressing   Problem: Safety: Goal: Ability to remain free from injury will improve Outcome: Progressing   Problem: Skin Integrity: Goal: Risk for impaired skin integrity will decrease Outcome: Progressing

## 2023-03-15 NOTE — ED Notes (Signed)
ED TO INPATIENT HANDOFF REPORT  ED Nurse Name and Phone #: Angelica Chessman, RN  S Name/Age/Gender Peter Mcmillan 67 y.o. male Room/Bed: 046C/046C  Code Status   Code Status: Full Code  Home/SNF/Other Home Patient oriented to: self, place, time, and situation Is this baseline? Yes   Triage Complete: Triage complete  Chief Complaint Weakness [R53.1]  Triage Note Pt arrives via [facility] for CODE STROKE. Pt reports that at 1415 he was having RLQ abd pain and went to have a BM. Pt reports gross hematochezia at that time and sudden onset of L sided weakness and decreased sensation.    Allergies Allergies  Allergen Reactions   Codeine Other (See Comments)    agitation   Other Hives, Swelling and Other (See Comments)    make feel weird; hives/swelling   Penicillin G Other (See Comments)   Penicillins Itching, Swelling and Rash    Reaction: 5 years ago    Level of Care/Admitting Diagnosis ED Disposition     ED Disposition  Admit   Condition  --   Comment  Hospital Area: [facility] [[id number]]  Level of Care: Telemetry Medical [104]  May place patient in observation at [facility] or [facility] if equivalent level of care is available:: No  Covid Evaluation: Asymptomatic - no recent exposure (last 10 days) testing not required  Diagnosis: Weakness [241835]  Admitting Physician: Penne Lash [[id number]]  Attending Physician: Caro Laroche [[id number]]          B Medical/Surgery History Past Medical History:  Diagnosis Date   Atrial flutter (HCC)    Bursitis of right elbow    Erectile dysfunction    Rheumatoid arthritis (HCC)    Tobacco abuse    Smokeless tobacco   Past Surgical History:  Procedure Laterality Date   ATRIAL FIBRILLATION ABLATION N/A 12/17/2020   Procedure: ATRIAL FIBRILLATION ABLATION;  Surgeon: Lanier Prude, MD;  Location: [facility] INVASIVE CV LAB;  Service: Cardiovascular;  Laterality: N/A;   BACK SURGERY  1999    CARDIOVERSION N/A 10/06/2020   Procedure: CARDIOVERSION;  Surgeon: Little Ishikawa, MD;  Location: [facility] ENDOSCOPY;  Service: Cardiovascular;  Laterality: N/A;   MELANOMA EXCISION  2020   TEE WITHOUT CARDIOVERSION N/A 10/06/2020   Procedure: TRANSESOPHAGEAL ECHOCARDIOGRAM (TEE);  Surgeon: Little Ishikawa, MD;  Location: [facility] ENDOSCOPY;  Service: Cardiovascular;  Laterality: N/A;   TEE WITHOUT CARDIOVERSION N/A 12/17/2020   Procedure: TRANSESOPHAGEAL ECHOCARDIOGRAM (TEE);  Surgeon: Sande Rives, MD;  Location: [facility] INVASIVE CV LAB;  Service: Cardiovascular;  Laterality: N/A;     A IV Location/Drains/Wounds Patient Lines/Drains/Airways Status     Active Line/Drains/Airways     Name Placement date Placement time Site Days   Peripheral IV 03/14/23 18 G Anterior;Distal;Right Forearm 03/14/23  1544  Forearm  1   Peripheral IV 03/14/23 18 G Anterior;Distal;Left Forearm 03/14/23  1615  Forearm  1            Intake/Output Last 24 hours  Intake/Output Summary (Last 24 hours) at 03/15/2023 1239 Last data filed at 03/15/2023 1238 Gross per 24 hour  Intake 100 ml  Output 750 ml  Net -650 ml    Labs/Imaging Results for orders placed or performed during the hospital encounter of 03/14/23 (from the past 48 hours)  CBG monitoring, ED     Status: Abnormal   Collection Time: 03/14/23  3:44 PM  Result Value Ref Range   Glucose-Capillary 147 (H) 70 - 99 mg/dL  Comment: Glucose reference range applies only to samples taken after fasting for at least 8 hours.  Protime-INR     Status: None   Collection Time: 03/14/23  3:45 PM  Result Value Ref Range   Prothrombin Time 15.0 11.4 - 15.2 seconds   INR 1.2 0.8 - 1.2    Comment: (NOTE) INR goal varies based on device and disease states. Performed at [facility], [address] [place], [place] [address]   APTT     Status: None   Collection Time: 03/14/23  3:45 PM  Result Value Ref Range   aPTT 33 24 - 36 seconds     Comment: Performed at [facility], [address] [place], [place] [address]  CBC     Status: Abnormal   Collection Time: 03/14/23  3:45 PM  Result Value Ref Range   WBC 11.0 (H) 4.0 - 10.5 K/uL   RBC 4.98 4.22 - 5.81 MIL/uL   Hemoglobin 15.2 13.0 - 17.0 g/dL   HCT 82.9 56.2 - 13.0 %   MCV 92.2 80.0 - 100.0 fL   MCH 30.5 26.0 - 34.0 pg   MCHC 33.1 30.0 - 36.0 g/dL   RDW 86.5 78.4 - 69.6 %   Platelets 269 150 - 400 K/uL   nRBC 0.0 0.0 - 0.2 %    Comment: Performed at [facility], [address] [place], [place] [address]  Differential     Status: Abnormal   Collection Time: 03/14/23  3:45 PM  Result Value Ref Range   Neutrophils Relative % 91 %   Neutro Abs 9.9 (H) 1.7 - 7.7 K/uL   Lymphocytes Relative 5 %   Lymphs Abs 0.6 (L) 0.7 - 4.0 K/uL   Monocytes Relative 3 %   Monocytes Absolute 0.3 0.1 - 1.0 K/uL   Eosinophils Relative 0 %   Eosinophils Absolute 0.0 0.0 - 0.5 K/uL   Basophils Relative 0 %   Basophils Absolute 0.0 0.0 - 0.1 K/uL   Immature Granulocytes 1 %   Abs Immature Granulocytes 0.08 (H) 0.00 - 0.07 K/uL    Comment: Performed at [facility], [address] [place], [place] [address]  Comprehensive metabolic panel     Status: Abnormal   Collection Time: 03/14/23  3:45 PM  Result Value Ref Range   Sodium 139 135 - 145 mmol/L   Potassium 4.3 3.5 - 5.1 mmol/L   Chloride 104 98 - 111 mmol/L   CO2 20 (L) 22 - 32 mmol/L   Glucose, Bld 153 (H) 70 - 99 mg/dL    Comment: Glucose reference range applies only to samples taken after fasting for at least 8 hours.   BUN 9 8 - 23 mg/dL   Creatinine, Ser 4.01 0.61 - 1.24 mg/dL   Calcium 9.3 8.9 - 02.7 mg/dL   Total Protein 6.5 6.5 - 8.1 g/dL   Albumin 3.0 (L) 3.5 - 5.0 g/dL   AST 24 15 - 41 U/L   ALT 23 0 - 44 U/L   Alkaline Phosphatase 42 38 - 126 U/L   Total Bilirubin 0.7 <1.2 mg/dL   GFR, Estimated >25 >36 mL/min    Comment: (NOTE) Calculated using the CKD-EPI Creatinine Equation (2021)     Anion gap 15 5 - 15    Comment: Performed at [facility], [address], [place], [place] [address]  Ethanol     Status: None   Collection Time: 03/14/23  3:45 PM  Result Value Ref Range   Alcohol, Ethyl (B) <10 <10 mg/dL    Comment: (NOTE) Lowest detectable limit for serum alcohol is 10 mg/dL.  For medical purposes only. Performed at [facility], [address], [place], [place] [address]   Lipase, blood     Status: None   Collection Time: 03/14/23  3:45 PM  Result Value Ref Range   Lipase 40 11 - 51 U/L    Comment: Performed at [facility], [address], [place], [place] [address]  I-stat chem 8, ED     Status: Abnormal   Collection Time: 03/14/23  3:49 PM  Result Value Ref Range   Sodium 136 135 - 145 mmol/L   Potassium 4.0 3.5 - 5.1 mmol/L   Chloride 104 98 - 111 mmol/L   BUN 12 8 - 23 mg/dL   Creatinine, Ser 0.27 0.61 - 1.24 mg/dL   Glucose, Bld 253 (H) 70 - 99 mg/dL    Comment: Glucose reference range applies only to samples taken after fasting for at least 8 hours.   Calcium, Ion 1.13 (L) 1.15 - 1.40 mmol/L   TCO2 20 (L) 22 - 32 mmol/L   Hemoglobin 15.3 13.0 - 17.0 g/dL   HCT 66.4 40.3 - 47.4 %  Type and screen [facility]     Status: None   Collection Time: 03/14/23  4:49 PM  Result Value Ref Range   ABO/RH(D) O POS    Antibody Screen NEG    Sample Expiration      03/17/2023,2359 Performed at [facility], [address], [place], [place] [address]   POC occult blood, ED     Status: None   Collection Time: 03/15/23  1:00 AM  Result Value Ref Range   Fecal Occult Bld NEGATIVE NEGATIVE  Lipid panel     Status: Abnormal   Collection Time: 03/15/23  4:42 AM  Result Value Ref Range   Cholesterol 125 0 - 200 mg/dL   Triglycerides 42 <387 mg/dL   HDL 36 (L) >56 mg/dL   Total CHOL/HDL Ratio 3.5 RATIO   VLDL 8 0 - 40 mg/dL   LDL Cholesterol 81 0 - 99 mg/dL    Comment:        Total Cholesterol/HDL:CHD Risk Coronary  Heart Disease Risk Table                     Men   Women  1/2 Average Risk   3.4   3.3  Average Risk       5.0   4.4  2 X Average Risk   9.6   7.1  3 X Average Risk  23.4   11.0        Use the calculated Patient Ratio above and the CHD Risk Table to determine the patient's CHD Risk.        ATP III CLASSIFICATION (LDL):  <100     mg/dL   Optimal  433-295  mg/dL   Near or Above                    Optimal  130-159  mg/dL   Borderline  188-416  mg/dL   High  >606     mg/dL   Very High Performed at [facility], [address], [place], [place] [address]   Hemoglobin A1c     Status: Abnormal   Collection Time: 03/15/23  4:42 AM  Result Value Ref Range   Hgb A1c MFr Bld 5.8 (H) 4.8 - 5.6 %    Comment: (NOTE) Pre diabetes:          5.7%-6.4%  Diabetes:              >6.4%  Glycemic control for   <7.0% adults with diabetes    Mean Plasma Glucose 119.76 mg/dL    Comment: Performed at [facility], [address], [place], [place] [address]  CBC     Status: None   Collection Time: 03/15/23  4:42 AM  Result Value Ref Range   WBC 7.7 4.0 - 10.5 K/uL   RBC 4.78 4.22 - 5.81 MIL/uL   Hemoglobin 14.6 13.0 - 17.0 g/dL   HCT 25.3 66.4 - 40.3 %   MCV 90.6 80.0 - 100.0 fL   MCH 30.5 26.0 - 34.0 pg   MCHC 33.7 30.0 - 36.0 g/dL   RDW 47.4 25.9 - 56.3 %   Platelets 257 150 - 400 K/uL   nRBC 0.0 0.0 - 0.2 %    Comment: Performed at [facility], [address] [place], [place] [address]  Magnesium     Status: None   Collection Time: 03/15/23  4:42 AM  Result Value Ref Range   Magnesium 2.0 1.7 - 2.4 mg/dL    Comment: Performed at [facility], [address], [place], [place] [address]  Basic metabolic panel     Status: Abnormal   Collection Time: 03/15/23  4:42 AM  Result Value Ref Range   Sodium 136 135 - 145 mmol/L   Potassium 4.3 3.5 - 5.1 mmol/L    Comment: HEMOLYSIS AT THIS LEVEL MAY AFFECT RESULT   Chloride 104 98 - 111 mmol/L   CO2 21 (L) 22 - 32  mmol/L   Glucose, Bld 89 70 - 99 mg/dL    Comment: Glucose reference range applies only to samples taken after fasting for at least 8 hours.   BUN 10 8 - 23 mg/dL   Creatinine, Ser 1.88 0.61 - 1.24 mg/dL   Calcium 8.7 (L) 8.9 - 10.3 mg/dL   GFR, Estimated >41 >66 mL/min    Comment: (NOTE) Calculated using the CKD-EPI Creatinine Equation (2021)    Anion gap 11 5 - 15    Comment: Performed at [facility], [address], [place], [place] [address]   MR BRAIN WO CONTRAST Result Date: 03/14/2023 CLINICAL DATA:  Follow-up examination for stroke. EXAM: MRI HEAD WITHOUT CONTRAST TECHNIQUE: Multiplanar, multiecho pulse sequences of the brain and surrounding structures were obtained without intravenous contrast. COMPARISON:  CT from earlier the same day. FINDINGS: Brain: Cerebral volume within normal limits. Mild chronic microvascular ischemic disease for age. No evidence for acute or subacute ischemia. Gray-white matter differentiation maintained. No areas of chronic cortical infarction. No acute or chronic intracranial blood products. No mass lesion, midline shift or mass effect. No hydrocephalus or extra-axial fluid collection. Pituitary gland and suprasellar region within normal limits. Vascular: Major intracranial vascular flow voids are maintained. Skull and upper cervical spine: Craniocervical junction within normal limits. Bone marrow signal intensity normal. No scalp soft tissue abnormality. Sinuses/Orbits: Globes and orbital soft tissues within normal limits. Paranasal sinuses and mastoid air cells are clear. Other: None. IMPRESSION: 1. No acute intracranial abnormality. 2. Mild chronic microvascular ischemic disease for age. Electronically Signed   By: Rise Mu M.D.   On: 03/14/2023 21:06  CT ANGIO GI BLEED Result Date: 03/14/2023 CLINICAL DATA:  Code stroke, acute mesenteric ischemia EXAM: CTA ABDOMEN AND PELVIS WITHOUT AND WITH CONTRAST TECHNIQUE: Multidetector CT imaging  of the abdomen and pelvis was performed using the standard protocol during bolus administration of intravenous contrast. Multiplanar reconstructed images and MIPs were obtained and reviewed to evaluate the vascular anatomy. RADIATION DOSE REDUCTION: This exam was performed according to the departmental dose-optimization program which includes automated exposure control, adjustment of the mA and/or kV according to patient size and/or use of iterative reconstruction technique. CONTRAST:  75mL OMNIPAQUE IOHEXOL 350 MG/ML SOLN COMPARISON:  CT abdomen/pelvis dated 08/14/2008 FINDINGS: VASCULAR Aorta: No evidence of abdominal aortic aneurysm or dissection. Patent. Mild atherosclerotic calcifications. Celiac: Patent. SMA: Patent. Renals: Patent bilaterally. IMA: Patent. Inflow: Patent bilaterally. Proximal Outflow: Patent bilaterally. Veins: Within normal limits.  Specifically, the SMV is patent. Review of the MIP images confirms the above findings. NON-VASCULAR Lower chest: Mild subpleural scarring/atelectasis lung bases. Hepatobiliary: Liver is within normal limits. Gallbladder is unremarkable. No intrahepatic or extrahepatic ductal dilatation. Pancreas: Within normal limits. Spleen: Within normal limits. Adrenals/Urinary Tract: Adrenal glands are within normal limits. Kidneys are within normal limits. Early excretory contrast related to prior imaging in the renal collecting systems, ureters, and bladder. No hydronephrosis. Bladder is within normal limits. Stomach/Bowel: Stomach is within normal limits. No evidence of bowel obstruction. Normal appendix (series 17/image 150). Mild sigmoid diverticulosis, without evidence of diverticulitis. No colonic wall thickening, inflammatory changes or mass is evident on CT. Following contrast administration, there is no intraluminal spillage of contrast to suggest active GI bleeding. No bowel wall thickening or pneumatosis to suggest bowel ischemia. No free air. Lymphatic: No  suspicious abdominopelvic lymphadenopathy. Reproductive: Mild prostatomegaly, suggesting BPH. Other: No abdominopelvic ascites. Musculoskeletal: Mild degenerative changes of the lower lumbar spine. IMPRESSION: No evidence of active GI bleeding or bowel ischemia. Mild sigmoid diverticulosis, without evidence of diverticulitis. Electronically Signed   By: Charline Bills M.D.   On: 03/14/2023 17:59   CT ANGIO HEAD NECK W WO CM (CODE STROKE) Result Date: 03/14/2023 CLINICAL DATA:  Neuro deficit, acute, stroke suspected. Right arm and left leg weakness. EXAM: CT ANGIOGRAPHY HEAD AND NECK WITH AND WITHOUT CONTRAST TECHNIQUE: Multidetector CT imaging of the head and neck was performed using the standard protocol during bolus administration of intravenous contrast. Multiplanar CT image reconstructions and MIPs were obtained to evaluate the vascular anatomy. Carotid stenosis measurements (when applicable) are obtained utilizing NASCET criteria, using the distal internal carotid diameter as the denominator. RADIATION DOSE REDUCTION: This exam was performed according to the departmental dose-optimization program which includes automated exposure control, adjustment of the mA and/or kV according to patient size and/or use of iterative reconstruction technique. CONTRAST:  75mL OMNIPAQUE IOHEXOL 350 MG/ML SOLN COMPARISON:  None Available. FINDINGS: CTA NECK FINDINGS Aortic arch: Standard branching with widely patent arch vessel origins. Right carotid system: Patent without evidence of stenosis, dissection, or significant atherosclerosis. Left carotid system: Patent with minimal atherosclerosis at the carotid bifurcation. No evidence of stenosis or dissection. Vertebral arteries: Patent without evidence of stenosis, dissection, or significant atherosclerosis. Mildly dominant left vertebral artery. Skeleton: Mild cervical spondylosis. Other neck: No evidence of cervical lymphadenopathy or mass. Upper chest: Clear lung  apices. Review of the MIP images confirms the above findings CTA HEAD FINDINGS Anterior circulation: The internal carotid arteries are patent from skull base to carotid termini with minimal atherosclerosis bilaterally not resulting in a significant stenosis. ACAs and MCAs are patent without evidence  of a proximal branch occlusion or significant proximal stenosis. No aneurysm is identified. Posterior circulation: The intracranial vertebral arteries are widely patent to the basilar. Patent PICA and SCA origins are visualized bilaterally. The basilar artery is widely patent. Posterior communicating arteries are diminutive or absent. Both PCAs are patent without evidence of a significant proximal stenosis. No aneurysm is identified. Venous sinuses: Poorly evaluated due to arterial contrast timing. Anatomic variants: None. Review of the MIP images confirms the above findings These results were communicated to Dr. Roda Shutters at 4:03 pm on 03/14/2023 by text page via the [facility] messaging system. IMPRESSION: Minimal atherosclerosis in the head and neck without a large vessel occlusion or significant stenosis. Electronically Signed   By: Sebastian Ache M.D.   On: 03/14/2023 16:08   CT HEAD CODE STROKE WO CONTRAST Result Date: 03/14/2023 CLINICAL DATA:  Code stroke.  Left leg weakness EXAM: CT HEAD WITHOUT CONTRAST TECHNIQUE: Contiguous axial images were obtained from the base of the skull through the vertex without intravenous contrast. RADIATION DOSE REDUCTION: This exam was performed according to the departmental dose-optimization program which includes automated exposure control, adjustment of the mA and/or kV according to patient size and/or use of iterative reconstruction technique. COMPARISON:  CT head 10/05/20 FINDINGS: Brain: No hemorrhage. No hydrocephalus. No extra-axial fluid collection. No mass effect. No mass lesion. No CT evidence of an acute cortical infarct. Vascular: No hyperdense vessel or unexpected calcification.  Skull: Normal. Negative for fracture or focal lesion. Sinuses/Orbits: No middle ear or mastoid effusion. Paranasal sinuses are clear. Orbits are unremarkable. Other: None. ASPECTS [facility] Stroke Program Early CT Score): 10 IMPRESSION: No hemorrhage or CT evidence of an acute cortical infarct. ASPECTS 10. Findings were paged to Dr. Roda Shutters on 03/14/23 at 3:55 PM. Electronically Signed   By: Lorenza Cambridge M.D.   On: 03/14/2023 15:57    Pending Labs Unresulted Labs (From admission, onward)     Start     Ordered   03/15/23 1400  CBC  Once,   R        03/15/23 1102   03/14/23 2104  Occult blood card to lab, stool  Once,   R        03/14/23 2103            Vitals/Pain Today's Vitals   03/15/23 0600 03/15/23 0915 03/15/23 1100 03/15/23 1200  BP: (!) 137/101 (!) 140/91 (!) 154/100 (!) 134/102  Pulse: 80 92 70 66  Resp: (!) 22 19 18 14   Temp:  98 F (36.7 C)    TempSrc:  Oral    SpO2: 96% 100% 100% 98%  Weight:      PainSc:        Isolation Precautions No active isolations  Medications Medications  dofetilide (TIKOSYN) capsule 250 mcg (250 mcg Oral Given 03/15/23 0941)  sacubitril-valsartan (ENTRESTO) 24-26 mg per tablet (1 tablet Oral Given 03/15/23 1229)  metoprolol succinate (TOPROL-XL) 24 hr tablet 50 mg (50 mg Oral Given 03/15/23 1229)  pantoprazole (PROTONIX) EC tablet 40 mg (40 mg Oral Given 03/15/23 1229)  predniSONE (DELTASONE) tablet 10 mg (has no administration in time range)  sodium chloride flush (NS) 0.9 % injection 3 mL (3 mLs Intravenous Given 03/14/23 1930)  iohexol (OMNIPAQUE) 350 MG/ML injection 75 mL (75 mLs Intravenous Contrast Given 03/14/23 1553)  iohexol (OMNIPAQUE) 350 MG/ML injection 75 mL (75 mLs Intravenous Contrast Given 03/14/23 1700)   stroke: early stages of recovery book ( Does not apply Given 03/15/23 0941)  Mobility walks     Focused Assessments Neuro Assessment Handoff:  Swallow screen pass? Yes  Cardiac Rhythm: Atrial fibrillation NIH  Stroke Scale  Dizziness Present: No Headache Present: No Interval: Shift assessment Level of Consciousness (1a.)   : Alert, keenly responsive LOC Questions (1b. )   : Answers both questions correctly LOC Commands (1c. )   : Performs both tasks correctly Best Gaze (2. )  : Normal Visual (3. )  : No visual loss Facial Palsy (4. )    : Normal symmetrical movements Motor Arm, Left (5a. )   : Drift Motor Arm, Right (5b. ) : No drift Motor Leg, Left (6a. )  : Drift Motor Leg, Right (6b. ) : No drift Limb Ataxia (7. ): Absent Sensory (8. )  : Mild-to-moderate sensory loss, patient feels pinprick is less sharp or is dull on the affected side, or there is a loss of superficial pain with pinprick, but patient is aware of being touched Best Language (9. )  : No aphasia Dysarthria (10. ): Normal Extinction/Inattention (11.)   : No Abnormality Complete NIHSS TOTAL: 3 Last date known well: 03/14/23 Last time known well: 1415 Neuro Assessment:   Neuro Checks:   Initial (03/14/23 1600)  Has TPA been given? No If patient is a Neuro Trauma and patient is going to OR before floor call report to 4N Charge nurse: [phone] or [phone]   R Recommendations: See Admitting Provider Note  Report given to:   Additional Notes: Pt is AOX4, walky talky, continent, good historian, takes meds whole without difficulty, family at bedside

## 2023-03-15 NOTE — Evaluation (Signed)
Physical Therapy Evaluation Patient Details Name: Peter Mcmillan MRN: [id number] DOB: February 01, 1956 Today's Date: 03/15/2023  History of Present Illness  Pt is a 67 yr old male who presented 03/14/23 due to hematochezia, L side weakness and R quadrant pain. CT showed head and neck with minimal atherosclerosis. MRI did not show any acute findings. Pt needing further workup for R quadrant pain as possible diverticulitis/hemorrhoids/anal fissure. PMH: afib, back sx, cardioversion   Clinical Impression  Pt presents with condition above and deficits mentioned below, see PT Problem List. Pt lives with his spouse in a 1-level house with 5 STE. 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 Currently, pt demonstrates some potentially very mild weakness at his L leg compared to his R when performing MMT, but it is very subtle if present. He has a hx of bil peripheral neuropathy in his feet. He does display deficits in balance that place him at risk as he was unable to withstand vestibular challenges while ambulating without a LOB and needing to take reactional steps to regain his balance. He was able to ambulate without UE support and with just CGA for safety though. Pt will likely progress well and not need follow-up PT, but will follow acutely to address his balance deficits and maximize his return to baseline prior to d/c home.   Orthostatics -  146/90 supine 129/93 sitting 129/108 standing 112/93 standing ~3 min 126/92 sitting end of session  *pt denied any lightheadedness or symptoms of orthostatic hypotension throughout session      If plan is discharge home, recommend the following: Assistance with cooking/housework;Help with stairs or ramp for entrance   Can travel by private vehicle        Equipment Recommendations None recommended by PT  Recommendations for Other Services       Functional Status Assessment Patient has had a recent  decline in their functional status and demonstrates the ability to make significant improvements in function in a reasonable and predictable amount of time.     Precautions / Restrictions Precautions Precautions: Fall Precaution Comments: decrease in balance with any dynamic head movement, watch BP, HR Restrictions Weight Bearing Restrictions Per Provider Order: No      Mobility  Bed Mobility Overal bed mobility: Needs Assistance Bed Mobility: Supine to Sit     Supine to sit: Modified independent (Device/Increase time), Supervision     General bed mobility comments: HOB elevated, no assistance needed    Transfers Overall transfer level: Needs assistance Equipment used: None Transfers: Sit to/from Stand Sit to Stand: Supervision           General transfer comment: Supervision for safety rising from edge of stretcher, no LOB noted    Ambulation/Gait Ambulation/Gait assistance: Contact guard assist Gait Distance (Feet): 165 Feet (x2 bouts of ~20 ft > ~165 ft) Assistive device: None Gait Pattern/deviations: Step-through pattern, Decreased stride length, Narrow base of support, Staggering right, Staggering left Gait velocity: WFL Gait velocity interpretation: >2.62 ft/sec, indicative of community ambulatory   General Gait Details: Pt ambulates with a narrow stance, often placing one foot almost directly in front of the other. Pt had no LOB when ambulating with head facing forward, but when pt would turn his head L or R he would have a lateral LOB, taking a reactional step to regain his balance. CGA for safety.  Stairs            Wheelchair Mobility     Tilt Bed  Modified Rankin (Stroke Patients Only) Modified Rankin (Stroke Patients Only) Pre-Morbid Rankin Score: No symptoms Modified Rankin: No significant disability     Balance Overall balance assessment: Needs assistance Sitting-balance support: Feet supported Sitting balance-Leahy Scale: Good      Standing balance support: No upper extremity supported Standing balance-Leahy Scale: Fair Standing balance comment: LOB with vestibular challenges when ambulating, but able to recover with reactional steps                             Pertinent Vitals/Pain Pain Assessment Pain Assessment: Faces Faces Pain Scale: Hurts a little bit Pain Location: general aching pain due to arthritis Pain Descriptors / Indicators: Aching Pain Intervention(s): Limited activity within patient's tolerance, Monitored during session, Repositioned    Home Living Family/patient expects to be discharged to:: Private residence Living Arrangements: Spouse/significant other Available Help at Discharge: Family Type of Home: House Home Access: Stairs to enter Entrance Stairs-Rails: Right;Left;Can reach both Secretary/administrator of Steps: 5   Home Layout: One level Home Equipment: Toilet riser      Prior Function Prior Level of Function : Independent/Modified Independent;Working/employed;Driving             Mobility Comments: No AD ADLs Comments: wears prescription glasses; 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, retail)     Extremity/Trunk Assessment   Upper Extremity Assessment Upper Extremity Assessment: Defer to OT evaluation    Lower Extremity Assessment Lower Extremity Assessment: LLE deficits/detail;RLE deficits/detail RLE Deficits / Details: hx of peripheral neuropathy bil impacting sensation at bil plantar aspects of feet, no acute sensory changes reported RLE Sensation: history of peripheral neuropathy LLE Deficits / Details: potentially very mild L weakness compared to his R with MMT, slight if so; hx of peripheral neuropathy bil impacting sensation at bil plantar aspects of feet, no acute sensory changes reported LLE Sensation: history of peripheral neuropathy    Cervical / Trunk Assessment Cervical / Trunk Assessment: Kyphotic  Communication    Communication Communication: No apparent difficulties Cueing Techniques: Verbal cues  Cognition Arousal: Alert Behavior During Therapy: WFL for tasks assessed/performed Overall Cognitive Status: Within Functional Limits for tasks assessed                                          General Comments General comments (skin integrity, edema, etc.): BP - 146/90 supine, 129/93 sitting, 129/108 standing, 112/93 standing ~3 min, 126/92 sitting end of session; pt denied any lightheadedness or symptoms of orthostatic hypotension throughout session    Exercises     Assessment/Plan    PT Assessment Patient needs continued PT services  PT Problem List Decreased strength;Decreased balance;Decreased mobility;Cardiopulmonary status limiting activity       PT Treatment Interventions DME instruction;Gait training;Stair training;Functional mobility training;Therapeutic activities;Balance training;Therapeutic exercise;Neuromuscular re-education;Patient/family education    PT Goals (Current goals can be found in the Care Plan section)  Acute Rehab PT Goals Patient Stated Goal: to improve PT Goal Formulation: With patient Time For Goal Achievement: 03/29/23 Potential to Achieve Goals: Good    Frequency Min 1X/week     Co-evaluation PT/OT/SLP Co-Evaluation/Treatment: Yes Reason for Co-Treatment: Complexity of the patient's impairments (multi-system involvement);For patient/therapist safety PT goals addressed during session: Mobility/safety with mobility;Balance OT goals addressed during session: ADL's and self-care       AM-PAC PT "6 Clicks" Mobility  Outcome Measure Help  needed turning from your back to your side while in a flat bed without using bedrails?: None Help needed moving from lying on your back to sitting on the side of a flat bed without using bedrails?: None Help needed moving to and from a bed to a chair (including a wheelchair)?: A Little Help needed standing up  from a chair using your arms (e.g., wheelchair or bedside chair)?: A Little Help needed to walk in hospital room?: A Little Help needed climbing 3-5 steps with a railing? : A Little 6 Click Score: 20    End of Session Equipment Utilized During Treatment: Gait belt Activity Tolerance: Patient tolerated treatment well Patient left: in bed;with call bell/phone within reach   PT Visit Diagnosis: Unsteadiness on feet (R26.81);Other abnormalities of gait and mobility (R26.89);Muscle weakness (generalized) (M62.81)    Time: 4098-1191 PT Time Calculation (min) (ACUTE ONLY): 27 min   Charges:   PT Evaluation $PT Eval Low Complexity: 1 Low   PT General Charges $$ ACUTE PT VISIT: 1 Visit         Virgil Benedict, PT, DPT Acute Rehabilitation Services  Office: [phone]   Bettina Gavia 03/15/2023, 1:26 PM

## 2023-03-15 NOTE — Consult Note (Signed)
Referring Provider: Dr. Linwood Dibbles Primary Care Physician:  Wilfrid Lund, PA Primary Gastroenterologist:  Dr. Evette Cristal  Reason for Consultation:  Hematochezia  HPI: Xayvion Berquist is a 67 y.o. male with acute onset of bright red blood per rectum yesterday afternoon that was associated with RLQ pain. Bleeding happened one time and has not recurred. Blood was large amount of explosive into toilet. Abdominal pain has improved. Felt lightheaded after the bloody stool. Denies N/V/melena. Denies rectal pain, burning, or itching. Left-sided weakness during bowel movement. Normal colonoscopy in June 2010. Gastritis on EGD in 2010. No BM or bleeding since episode yesterday. Hgb 15.4. Heme negative. On Eliquis for Atrial flutter.  Past Medical History:  Diagnosis Date   Atrial flutter (HCC)    Bursitis of right elbow    Erectile dysfunction    Rheumatoid arthritis (HCC)    Tobacco abuse    Smokeless tobacco    Past Surgical History:  Procedure Laterality Date   ATRIAL FIBRILLATION ABLATION N/A 12/17/2020   Procedure: ATRIAL FIBRILLATION ABLATION;  Surgeon: Lanier Prude, MD;  Location: [facility] INVASIVE CV LAB;  Service: Cardiovascular;  Laterality: N/A;   BACK SURGERY  1999   CARDIOVERSION N/A 10/06/2020   Procedure: CARDIOVERSION;  Surgeon: Little Ishikawa, MD;  Location: [facility] ENDOSCOPY;  Service: Cardiovascular;  Laterality: N/A;   MELANOMA EXCISION  2020   TEE WITHOUT CARDIOVERSION N/A 10/06/2020   Procedure: TRANSESOPHAGEAL ECHOCARDIOGRAM (TEE);  Surgeon: Little Ishikawa, MD;  Location: [facility] ENDOSCOPY;  Service: Cardiovascular;  Laterality: N/A;   TEE WITHOUT CARDIOVERSION N/A 12/17/2020   Procedure: TRANSESOPHAGEAL ECHOCARDIOGRAM (TEE);  Surgeon: Sande Rives, MD;  Location: [facility] INVASIVE CV LAB;  Service: Cardiovascular;  Laterality: N/A;    Prior to Admission medications   Medication Sig Start Date End Date Taking? Authorizing Provider  adalimumab (HUMIRA, 2 PEN,) 40  MG/0.4ML pen Inject 0.4 mLs (40 mg total) into the skin every 14 (fourteen) days. 1 kit - 2 pens 02/27/23  Yes Rice, Jamesetta Orleans, MD  dofetilide (TIKOSYN) 250 MCG capsule TAKE ONE CAPSULE BY MOUTH TWICE DAILY 08/09/22  Yes Fenton, Clint R, PA  ELIQUIS 5 MG TABS tablet TAKE ONE TABLET BY MOUTH TWICE A DAY 10/10/22  Yes Fenton, Clint R, PA  ENTRESTO 24-26 MG TAKE ONE TABLET BY MOUTH TWICE (2) DAILY 07/05/22  Yes Lanier Prude, MD  folic acid (FOLVITE) 1 MG tablet Take 1 tablet (1 mg total) by mouth daily. 07/24/22  Yes Rice, Jamesetta Orleans, MD  loratadine (CLARITIN) 10 MG tablet Take 10 mg by mouth daily as needed for allergies. 11/02/22  Yes [provider]  Magnesium 200 MG TABS TAKE ONE TABLET BY MOUTH ONCE A DAY 07/19/22  Yes Fenton, Clint R, PA  omeprazole (PRILOSEC) 40 MG capsule Take 40 mg by mouth daily. 08/09/22  Yes [provider]  potassium chloride (KLOR-CON) 10 MEQ tablet TAKE ONE TABLET BY MOUTH DAILY 01/02/23  Yes Fenton, Clint R, PA  VITAMIN D PO Take 2 tablets by mouth in the morning.   Yes [provider]  Cholecalciferol 100 MCG (4000 UT) CAPS Take 1 capsule (4,000 Units total) by mouth daily in the afternoon. Patient not taking: Reported on 03/14/2023 06/12/22   Pricilla Riffle, MD  metoprolol succinate (TOPROL-XL) 50 MG 24 hr tablet Take 1 tablet (50 mg total) by mouth 2 (two) times daily. Appt req for refill [id number] 03/15/23   Fenton, Clint R, PA    Scheduled Meds:  dofetilide  250  mcg Oral BID   metoprolol succinate  50 mg Oral BID   pantoprazole  40 mg Oral Daily   [START ON 03/16/2023] predniSONE  10 mg Oral Q breakfast   sacubitril-valsartan  1 tablet Oral BID   Continuous Infusions: PRN Meds:.  Allergies as of 03/14/2023 - Review Complete 03/14/2023  Allergen Reaction Noted   Codeine Other (See Comments) 12/16/2013   Other Hives, Swelling, and Other (See Comments) 06/19/2020   Penicillin g Other (See Comments) 12/16/2013   Penicillins  Itching, Swelling, and Rash 08/18/2008    Family History  Problem Relation Age of Onset   Heart attack Father    Heart attack Mother    Dementia Mother    Healthy Sister    Healthy Son     Social History   Socioeconomic History   Marital status: Married    Spouse name: Not on file   Number of children: 1   Years of education: Not on file   Highest education level: Associate degree: occupational, Scientist, product/process development, or vocational program  Occupational History   Occupation: Personnel officer  Tobacco Use   Smoking status: Never    Passive exposure: Never   Smokeless tobacco: Current    Types: Snuff   Tobacco comments:    1/4 can Snuff daily 04/05/2021  Vaping Use   Vaping status: Never Used  Substance and Sexual Activity   Alcohol use: Yes    Comment: rarely   Drug use: No   Sexual activity: Not on file  Other Topics Concern   Not on file  Social History Narrative   Not on file   Social Drivers of Health   Financial Resource Strain: High Risk (11/15/2020)   Overall Financial Resource Strain (CARDIA)    Difficulty of Paying Living Expenses: Hard  Food Insecurity: No Food Insecurity (11/15/2020)   Hunger Vital Sign    Worried About Running Out of Food in the Last Year: Never true    Ran Out of Food in the Last Year: Never true  Transportation Needs: No Transportation Needs (11/15/2020)   PRAPARE - Administrator, Civil Service (Medical): No    Lack of Transportation (Non-Medical): No  Physical Activity: Not on file  Stress: Not on file  Social Connections: Not on file  Intimate Partner Violence: Not on file    Review of Systems: All negative except as stated above in HPI.  Physical Exam: Vital signs: Vitals:   03/15/23 0600 03/15/23 0915  BP: (!) 137/101 (!) 140/91  Pulse: 80 92  Resp: (!) 22 19  Temp:  98 F (36.7 C)  SpO2: 96% 100%     General:  Lethargic, Well-developed, well-nourished, pleasant and cooperative in NAD Head: normocephalic,  atraumatic Eyes: anicteric sclera ENT: oropharynx clear Neck: supple, nontender Lungs:  Clear throughout to auscultation.   No wheezes, crackles, or rhonchi. No acute distress. Heart:  Regular rate and rhythm; no murmurs, clicks, rubs,  or gallops. Abdomen: lower quadrant tenderness with guarding, soft, nondistended, +BS  Rectal:  Deferred Ext: no edema  GI:  Lab Results: Recent Labs    03/14/23 1545 03/14/23 1549 03/15/23 0442  WBC 11.0*  --  7.7  HGB 15.2 15.3 14.6  HCT 45.9 45.0 43.3  PLT 269  --  257   BMET Recent Labs    03/14/23 1545 03/14/23 1549 03/15/23 0442  NA 139 136 136  K 4.3 4.0 4.3  CL 104 104 104  CO2 20*  --  21*  GLUCOSE  153* 158* 89  BUN 9 12 10   CREATININE 0.95 0.90 0.99  CALCIUM 9.3  --  8.7*   LFT Recent Labs    03/14/23 1545  PROT 6.5  ALBUMIN 3.0*  AST 24  ALT 23  ALKPHOS 42  BILITOT 0.7   PT/INR Recent Labs    03/14/23 1545  LABPROT 15.0  INR 1.2     Studies/Results: MR BRAIN WO CONTRAST Result Date: 03/14/2023 CLINICAL DATA:  Follow-up examination for stroke. EXAM: MRI HEAD WITHOUT CONTRAST TECHNIQUE: Multiplanar, multiecho pulse sequences of the brain and surrounding structures were obtained without intravenous contrast. COMPARISON:  CT from earlier the same day. FINDINGS: Brain: Cerebral volume within normal limits. Mild chronic microvascular ischemic disease for age. No evidence for acute or subacute ischemia. Gray-white matter differentiation maintained. No areas of chronic cortical infarction. No acute or chronic intracranial blood products. No mass lesion, midline shift or mass effect. No hydrocephalus or extra-axial fluid collection. Pituitary gland and suprasellar region within normal limits. Vascular: Major intracranial vascular flow voids are maintained. Skull and upper cervical spine: Craniocervical junction within normal limits. Bone marrow signal intensity normal. No scalp soft tissue abnormality. Sinuses/Orbits:  Globes and orbital soft tissues within normal limits. Paranasal sinuses and mastoid air cells are clear. Other: None. IMPRESSION: 1. No acute intracranial abnormality. 2. Mild chronic microvascular ischemic disease for age. Electronically Signed   By: Rise Mu M.D.   On: 03/14/2023 21:06   CT ANGIO GI BLEED Result Date: 03/14/2023 CLINICAL DATA:  Code stroke, acute mesenteric ischemia EXAM: CTA ABDOMEN AND PELVIS WITHOUT AND WITH CONTRAST TECHNIQUE: Multidetector CT imaging of the abdomen and pelvis was performed using the standard protocol during bolus administration of intravenous contrast. Multiplanar reconstructed images and MIPs were obtained and reviewed to evaluate the vascular anatomy. RADIATION DOSE REDUCTION: This exam was performed according to the departmental dose-optimization program which includes automated exposure control, adjustment of the mA and/or kV according to patient size and/or use of iterative reconstruction technique. CONTRAST:  75mL OMNIPAQUE IOHEXOL 350 MG/ML SOLN COMPARISON:  CT abdomen/pelvis dated 08/14/2008 FINDINGS: VASCULAR Aorta: No evidence of abdominal aortic aneurysm or dissection. Patent. Mild atherosclerotic calcifications. Celiac: Patent. SMA: Patent. Renals: Patent bilaterally. IMA: Patent. Inflow: Patent bilaterally. Proximal Outflow: Patent bilaterally. Veins: Within normal limits.  Specifically, the SMV is patent. Review of the MIP images confirms the above findings. NON-VASCULAR Lower chest: Mild subpleural scarring/atelectasis lung bases. Hepatobiliary: Liver is within normal limits. Gallbladder is unremarkable. No intrahepatic or extrahepatic ductal dilatation. Pancreas: Within normal limits. Spleen: Within normal limits. Adrenals/Urinary Tract: Adrenal glands are within normal limits. Kidneys are within normal limits. Early excretory contrast related to prior imaging in the renal collecting systems, ureters, and bladder. No hydronephrosis. Bladder is  within normal limits. Stomach/Bowel: Stomach is within normal limits. No evidence of bowel obstruction. Normal appendix (series 17/image 150). Mild sigmoid diverticulosis, without evidence of diverticulitis. No colonic wall thickening, inflammatory changes or mass is evident on CT. Following contrast administration, there is no intraluminal spillage of contrast to suggest active GI bleeding. No bowel wall thickening or pneumatosis to suggest bowel ischemia. No free air. Lymphatic: No suspicious abdominopelvic lymphadenopathy. Reproductive: Mild prostatomegaly, suggesting BPH. Other: No abdominopelvic ascites. Musculoskeletal: Mild degenerative changes of the lower lumbar spine. IMPRESSION: No evidence of active GI bleeding or bowel ischemia. Mild sigmoid diverticulosis, without evidence of diverticulitis. Electronically Signed   By: Charline Bills M.D.   On: 03/14/2023 17:59   CT ANGIO HEAD NECK W WO CM (  CODE STROKE) Result Date: 03/14/2023 CLINICAL DATA:  Neuro deficit, acute, stroke suspected. Right arm and left leg weakness. EXAM: CT ANGIOGRAPHY HEAD AND NECK WITH AND WITHOUT CONTRAST TECHNIQUE: Multidetector CT imaging of the head and neck was performed using the standard protocol during bolus administration of intravenous contrast. Multiplanar CT image reconstructions and MIPs were obtained to evaluate the vascular anatomy. Carotid stenosis measurements (when applicable) are obtained utilizing NASCET criteria, using the distal internal carotid diameter as the denominator. RADIATION DOSE REDUCTION: This exam was performed according to the departmental dose-optimization program which includes automated exposure control, adjustment of the mA and/or kV according to patient size and/or use of iterative reconstruction technique. CONTRAST:  75mL OMNIPAQUE IOHEXOL 350 MG/ML SOLN COMPARISON:  None Available. FINDINGS: CTA NECK FINDINGS Aortic arch: Standard branching with widely patent arch vessel origins. Right  carotid system: Patent without evidence of stenosis, dissection, or significant atherosclerosis. Left carotid system: Patent with minimal atherosclerosis at the carotid bifurcation. No evidence of stenosis or dissection. Vertebral arteries: Patent without evidence of stenosis, dissection, or significant atherosclerosis. Mildly dominant left vertebral artery. Skeleton: Mild cervical spondylosis. Other neck: No evidence of cervical lymphadenopathy or mass. Upper chest: Clear lung apices. Review of the MIP images confirms the above findings CTA HEAD FINDINGS Anterior circulation: The internal carotid arteries are patent from skull base to carotid termini with minimal atherosclerosis bilaterally not resulting in a significant stenosis. ACAs and MCAs are patent without evidence of a proximal branch occlusion or significant proximal stenosis. No aneurysm is identified. Posterior circulation: The intracranial vertebral arteries are widely patent to the basilar. Patent PICA and SCA origins are visualized bilaterally. The basilar artery is widely patent. Posterior communicating arteries are diminutive or absent. Both PCAs are patent without evidence of a significant proximal stenosis. No aneurysm is identified. Venous sinuses: Poorly evaluated due to arterial contrast timing. Anatomic variants: None. Review of the MIP images confirms the above findings These results were communicated to Dr. Roda Shutters at 4:03 pm on 03/14/2023 by text page via the [facility] messaging system. IMPRESSION: Minimal atherosclerosis in the head and neck without a large vessel occlusion or significant stenosis. Electronically Signed   By: Sebastian Ache M.D.   On: 03/14/2023 16:08   CT HEAD CODE STROKE WO CONTRAST Result Date: 03/14/2023 CLINICAL DATA:  Code stroke.  Left leg weakness EXAM: CT HEAD WITHOUT CONTRAST TECHNIQUE: Contiguous axial images were obtained from the base of the skull through the vertex without intravenous contrast. RADIATION DOSE  REDUCTION: This exam was performed according to the departmental dose-optimization program which includes automated exposure control, adjustment of the mA and/or kV according to patient size and/or use of iterative reconstruction technique. COMPARISON:  CT head 10/05/20 FINDINGS: Brain: No hemorrhage. No hydrocephalus. No extra-axial fluid collection. No mass effect. No mass lesion. No CT evidence of an acute cortical infarct. Vascular: No hyperdense vessel or unexpected calcification. Skull: Normal. Negative for fracture or focal lesion. Sinuses/Orbits: No middle ear or mastoid effusion. Paranasal sinuses are clear. Orbits are unremarkable. Other: None. ASPECTS [facility] Stroke Program Early CT Score): 10 IMPRESSION: No hemorrhage or CT evidence of an acute cortical infarct. ASPECTS 10. Findings were paged to Dr. Roda Shutters on 03/14/23 at 3:55 PM. Electronically Signed   By: Lorenza Cambridge M.D.   On: 03/14/2023 15:57    Impression/Plan: Hematochezia X 1 with left-sided weakness and RLQ pain - question diverticular bleed vs ischemic colitis. Neurology following. If bleeding recurs, then may need repeat colonoscopy as inpt otherwise do as  outpt in early 2025. Clear liquid diet. Ok to advance diet tomorrow if stable without further bleeding. Supportive care. [facility] will sign off. Call if questions.     LOS: 1 day   Shirley Friar  03/15/2023, 12:33 PM  Questions please call [phone]

## 2023-03-15 NOTE — ED Notes (Signed)
Patient left the floor in stable condition, AOX4, with his belongings, family, and staff, monitoring site advised.

## 2023-03-15 NOTE — Progress Notes (Signed)
Received into room from ED, accompanied by wife.  Oriented to room.

## 2023-03-15 NOTE — Evaluation (Addendum)
Occupational Therapy Evaluation Patient Details Name: Peter Mcmillan MRN: [id number] DOB: 1955-07-29 Today's Date: 03/15/2023   History of Present Illness Pt is a 67 yr old male who presented 03/14/23 due to hematochezia, L side weakness and R quadrant pain. CT showed head and neck with minimal atherosclerosis. MRI did not show any acute findings. Pt needing further workup for R quadrant pain as possible diverticulitis/hemorrhoids/anal fissure. PMH: afib, back sx, cardioversion   Clinical Impression   Pt reported at [facility] they do not use DME and they work as an Engineer, building services. He does report at baseline he is stiff in the mornings and noted to sometimes has decrease in balance. He is able to complete all ADLs with mod I to CGA. [name] was educated about signs of stroke as he reported it was the bleeding that made him fearful and to go into the ED. Acute Occupational Therapy will continue to follow.         If plan is discharge home, recommend the following: Assistance with cooking/housework;Assist for transportation    Functional Status Assessment  Patient has had a recent decline in their functional status and demonstrates the ability to make significant improvements in function in a reasonable and predictable amount of time.  Equipment Recommendations  None recommended by OT    Recommendations for Other Services       Precautions / Restrictions Precautions Precautions: Fall Precaution Comments: decrease in balance with any dynamic head movement, watch HR/BP Restrictions Weight Bearing Restrictions Per Provider Order: No      Mobility Bed Mobility Overal bed mobility: Needs Assistance Bed Mobility: Supine to Sit     Supine to sit: Modified independent (Device/Increase time), Supervision     General bed mobility comments: cued to pace self    Transfers Overall transfer level: Needs assistance Equipment used: None Transfers: Sit to/from Stand Sit  to Stand: Supervision           General transfer comment: cued to pace self      Balance Overall balance assessment: Needs assistance Sitting-balance support: Feet supported Sitting balance-Leahy Scale: Good     Standing balance support: No upper extremity supported Standing balance-Leahy Scale: Fair                             ADL either performed or assessed with clinical judgement   ADL Overall ADL's : Needs assistance/impaired Eating/Feeding: Independent;Sitting   Grooming: Wash/dry hands;Wash/dry face;Supervision/safety;Standing   Upper Body Bathing: Set up;Sitting   Lower Body Bathing: Cueing for safety;Cueing for sequencing;Sit to/from stand;Sitting/lateral leans;Contact guard assist   Upper Body Dressing : Set up;Sitting   Lower Body Dressing: Contact guard assist;Sit to/from stand   Toilet Transfer: Supervision/safety;Cueing for safety;Stand-pivot   Toileting- Clothing Manipulation and Hygiene: Supervision/safety;Sit to/from stand       Functional mobility during ADLs: Contact guard assist;Minimal assistance General ADL Comments: when going straight away with no dynamic head movment then no LOb     Vision Baseline Vision/History: 1 Wears glasses Ability to See in Adequate Light: 0 Adequate Patient Visual Report: No change from baseline Vision Assessment?: No apparent visual deficits     Perception Perception: Within Functional Limits       Praxis Praxis: WFL       Pertinent Vitals/Pain Pain Assessment Pain Assessment: Faces Faces Pain Scale: Hurts a little bit Pain Location: general aching pain due to arthritis Pain Descriptors / Indicators: Aching Pain Intervention(s): Monitored during  session     Extremity/Trunk Assessment Upper Extremity Assessment Upper Extremity Assessment: Right hand dominant;Generalized weakness   Lower Extremity Assessment Lower Extremity Assessment: Defer to PT evaluation   Cervical / Trunk  Assessment Cervical / Trunk Assessment: Kyphotic   Communication Communication Communication: No apparent difficulties Cueing Techniques: Verbal cues   Cognition Arousal: Alert Behavior During Therapy: WFL for tasks assessed/performed Overall Cognitive Status: Within Functional Limits for tasks assessed                                       General Comments       Exercises     Shoulder Instructions      Home Living Family/patient expects to be discharged to:: Private residence Living Arrangements: Spouse/significant other Available Help at Discharge: Family Type of Home: House Home Access: Stairs to enter Secretary/administrator of Steps: 5 Entrance Stairs-Rails: Right;Left;Can reach both Home Layout: One level     Bathroom Shower/Tub: Producer, television/film/video: Handicapped height     Home Equipment: Toilet riser          Prior Functioning/Environment Prior Level of Function : Independent/Modified Independent;Working/employed;Driving             Mobility Comments: No AD ADLs Comments: wears prescription glasses; works as an Geophysical data processor Problem List: Decreased strength;Impaired balance (sitting and/or standing);Decreased activity tolerance;Decreased safety awareness;Cardiopulmonary status limiting activity;Pain      OT Treatment/Interventions: Self-care/ADL training;Therapeutic exercise;Therapeutic activities;Patient/family education;Balance training    OT Goals(Current goals can be found in the care plan section) Acute Rehab OT Goals Patient Stated Goal: to feel stronger OT Goal Formulation: With patient Time For Goal Achievement: 03/29/23 Potential to Achieve Goals: Good  OT Frequency: Min 1X/week    Co-evaluation PT/OT/SLP Co-Evaluation/Treatment: Yes Reason for Co-Treatment: Complexity of the patient's impairments (multi-system involvement)   OT goals addressed during session: ADL's and self-care      AM-PAC  OT "6 Clicks" Daily Activity     Outcome Measure Help from another person eating meals?: None Help from another person taking care of personal grooming?: None Help from another person toileting, which includes using toliet, bedpan, or urinal?: A Little Help from another person bathing (including washing, rinsing, drying)?: A Little Help from another person to put on and taking off regular upper body clothing?: None Help from another person to put on and taking off regular lower body clothing?: A Little 6 Click Score: 21   End of Session Equipment Utilized During Treatment: Gait belt Nurse Communication: Mobility status  Activity Tolerance: Patient tolerated treatment well Patient left: in bed;with call bell/phone within reach  OT Visit Diagnosis: Unsteadiness on feet (R26.81);Other abnormalities of gait and mobility (R26.89);Repeated falls (R29.6);Muscle weakness (generalized) (M62.81);Pain Pain - part of body:  (general aching)                Time: 7253-6644 OT Time Calculation (min): 27 min Charges:  OT General Charges $OT Visit: 1 Visit OT Evaluation $OT Eval Low Complexity: 1 Low  Presley Raddle OTR/L  Acute Rehab Services  [phone] office number   Alphia Moh 03/15/2023, 11:50 AM

## 2023-03-15 NOTE — Discharge Instructions (Addendum)
Dear Peter Mcmillan,   Thank you for letting us participate in your care! In this section, you will find a brief hospital admission summary of why you were admitted to the hospital, what happened during your admission, your diagnosis/diagnoses, and recommended follow up.   You were seen by neurology for your right-sided weakness and the workup was negative for stroke.  You were seen by the GI doctor for a blood in your stool.  Your blood cell count remained stable and the GI doctor determined he could continue workup outpatient with a colonoscopy in early 2025.   POST-HOSPITAL & CARE INSTRUCTIONS We recommend following up with your PCP within 1 week from being discharged from the hospital. Please let PCP/Specialists know of any changes in medications that were made which you will be able to see in the medications section of this packet. Please also follow up with GI doctor outpatient.  DOCTOR'S APPOINTMENTS & FOLLOW UP Please call your PCP to schedule a follow up appointment within a week by telling them you just got out of the hospital.   Thank you for choosing [facility]! Take care and be well!  Family Medicine Teaching Service Inpatient Team [place]  [facility]  [address] [place], [place] [address] [phone]

## 2023-03-15 NOTE — Assessment & Plan Note (Signed)
Patient had one episode of BRBPR with BM this evening. No further events. Has followed with [facility] Dr. Dulce Sellar in the past - per patient. Unable to find records of colonoscopy. CT angio GI did not show active bleeding. Hg 15.4 with stable vital signs. GI consulted in ED, will evaluate patient in AM.  - GI consulted, appreciate recs - NPO @ MN - AM CBC

## 2023-03-15 NOTE — Progress Notes (Signed)
Daily Progress Note Intern Pager: [phone]  Patient name: Peter Mcmillan Medical record number: [id number] Date of birth: 12-29-1955 Age: 67 y.o. Gender: male  Primary Care Provider: Wilfrid Lund, PA Consultants: Neurology, Peter Mcmillan GI Code Status: FULL  Pt Overview and Major Events to Date:  12/11 - Admitted, stroke eval w/ Neuro, MRI, CTA neck, CT head, and CTAP all normal 12/12 - GI eval   Assessment and Plan: Peter Mcmillan is a 67 y.o. male with a pertinent PMH of CHF, Afib s/p ablation on dofetilide and Eliquis, and RA on prednisone and Humira who presented with L-sided numbness and BRBPR, admitted for stroke workup and GI eval.  Low concern for LGIB given stable hemoglobin overnight and CTAP.  Discussed with Peter Mcmillan over secure chat, who cleared patient for discharge later today following evaluation with outpatient follow-up for colonoscopy in early 2025.  Will await PM CBC and follow-up TTE, then plan to discharge later today if hemoglobin stable and no acute pathology on TTE. Assessment & Plan Weakness No focal neurological deficit.  CT angio head and neck and CT head negative.  MRI head negative.  Unclear etiology, possible TIA, but more likely anxiety in setting of BRBPR. -Neurochecks q4h -f/u TTE -PT/OT/SLP eval and treat -Appreciate neurology recs:  -BP goal <180/105 given on Eliquis with concern for LGIB Bright red blood per rectum Single episode BRBPR yesterday, low concern for active LGIB with stable Hgb overnight.  Follows with [facility], Peter Mcmillan, and confirms colonoscopy 6 years ago. -Tolerated clear liquid diet, now advancing to full liquids -Hold Eliquis pending GI eval -PM CBC @ 1400 -Appreciate [facility] recs, per Secure Chat with Peter Mcmillan can discharge with outpatient colonoscopy early 2025  Chronic and Stable Problems: A fib/A flutter: Hold Eliquis given concern for bleed, continue dofetilide 50 mg BID, metoprolol succinate 50 mg  BID HFpEF/HTN: Restart Entrestro 24-26 Rheumatoid arthritis: Restart prednisone 10 mg daily, last Humira dose 12/10 involving patient Tobacco use: Patient declined nicotine patch  FEN/GI: Clear liquid advancing to full liquids PPx: SCDs. Holding Eliquis pending GIB evaluation Dispo: Home pending PM CBC and TTE results.  Subjective:  Patient reports that he has daily BMs at baseline and that they are soft and without melena.  Confirms only single episode BRBPR yesterday.  States that his left side numbness is resolved this morning and he feels well overall.  Objective: Temp:  [97.5 F (36.4 C)-98.6 F (37 C)] 98 F (36.7 C) (12/12 0915) Pulse Rate:  [61-93] 92 (12/12 0915) Resp:  [14-26] 19 (12/12 0915) BP: (118-157)/(79-101) 140/91 (12/12 0915) SpO2:  [94 %-100 %] 100 % (12/12 0915) Weight:  [102 kg] 102 kg (12/11 1548)  Physical Exam: General: Appears stated age, resting comfortably in bed, NAD, alert and at baseline. HEENT: MMM. No mucosal pallor. Cardiovascular: Normal rate and irregular rhythm. Normal S1/S2. No murmurs, rubs, or gallops appreciated. 2+ radial pulses. Abdominal: Normoactive bowel sounds, nondistended. No tenderness to deep or light palpation. No rebound or guarding. Extremities: No peripheral edema bilaterally.  Capillary refill <2 seconds.  Neurological Examination: MS: Awake, alert, interactive. Normal eye contact, answered the questions appropriately, speech was fluent, normal comprehension.  Attention and concentration were normal. Cranial Nerves: Pupils were equal and reactive to light (5-67mm);  EOM normal, no nystagmus, no ptsosis, no double vision; intact facial sensation; face symmetric with full strength of facial muscles; hearing intact to finger rub bilaterally; palate elevation is symmetric; tongue protrusion is symmetric with full  movement to both sides; sternocleidomastoid and trapezius are with normal strength. Tone: Normal. Strength: Normal  strength in all muscle groups, possible 4/5 in LLE but notes some pain with testing. Coordination: No dysmetria on FTN test.  Laboratory: Most recent CBC Lab Results  Component Value Date   WBC 7.7 03/15/2023   HGB 14.6 03/15/2023   HCT 43.3 03/15/2023   MCV 90.6 03/15/2023   PLT 257 03/15/2023   Most recent BMP    Latest Ref Rng & Units 03/15/2023    4:42 AM  BMP  Glucose 70 - 99 mg/dL 89   BUN 8 - 23 mg/dL 10   Creatinine 8.84 - 1.24 mg/dL 1.66   Sodium 063 - 016 mmol/L 136   Potassium 3.5 - 5.1 mmol/L 4.3   Chloride 98 - 111 mmol/L 104   CO2 22 - 32 mmol/L 21   Calcium 8.9 - 10.3 mg/dL 8.7    Other pertinent labs: -A1c 5.8 -LDL 81 -FOBT negative  New Imaging/Diagnostic Tests: -CT head: Normal. -CTA head/neck: Minimal atherosclerosis, no occlusion. -CTA abdomen: No active GI bleed. Mild sigmoid diverticulosis  -MRI brain: No acute intracranial abnormality. -TTE: Pending.  Peter Limburg Sharion Dove, MD 03/15/2023, 9:42 AM  PGY-1, [facility] FPTS Intern pager: [phone], text pages welcome Secure chat group [facility] Teaching Service

## 2023-03-16 ENCOUNTER — Other Ambulatory Visit (HOSPITAL_COMMUNITY): Payer: Commercial Managed Care - PPO

## 2023-03-16 ENCOUNTER — Other Ambulatory Visit (HOSPITAL_COMMUNITY): Payer: Self-pay

## 2023-03-16 DIAGNOSIS — R200 Anesthesia of skin: Secondary | ICD-10-CM | POA: Diagnosis not present

## 2023-03-16 DIAGNOSIS — R531 Weakness: Secondary | ICD-10-CM | POA: Diagnosis not present

## 2023-03-16 LAB — CBC
HCT: 41.6 % (ref 39.0–52.0)
Hemoglobin: 14.2 g/dL (ref 13.0–17.0)
MCH: 30.9 pg (ref 26.0–34.0)
MCHC: 34.1 g/dL (ref 30.0–36.0)
MCV: 90.4 fL (ref 80.0–100.0)
Platelets: 264 10*3/uL (ref 150–400)
RBC: 4.6 MIL/uL (ref 4.22–5.81)
RDW: 12.2 % (ref 11.5–15.5)
WBC: 6.7 10*3/uL (ref 4.0–10.5)
nRBC: 0 % (ref 0.0–0.2)

## 2023-03-16 MED ORDER — APIXABAN 5 MG PO TABS
5.0000 mg | ORAL_TABLET | Freq: Two times a day (BID) | ORAL | Status: DC
  Administered 2023-03-16 – 2023-03-17 (×2): 5 mg via ORAL
  Filled 2023-03-16 (×2): qty 1

## 2023-03-16 MED ORDER — ATORVASTATIN CALCIUM 20 MG PO TABS
20.0000 mg | ORAL_TABLET | Freq: Every day | ORAL | 0 refills | Status: DC
  Filled 2023-03-16: qty 30, 30d supply, fill #0

## 2023-03-16 MED ORDER — PREDNISONE 10 MG PO TABS
10.0000 mg | ORAL_TABLET | Freq: Every day | ORAL | 0 refills | Status: DC
  Filled 2023-03-16: qty 30, 30d supply, fill #0

## 2023-03-16 NOTE — Progress Notes (Signed)
Speech Language Pathology Treatment:    Patient Details Name: Peter Mcmillan MRN: [id number] DOB: 1955/11/05 Today's Date: 03/16/2023 Time:  -      Pt screened and no noted difficulty with speech-language-cognition and pt had no concerns on arrival or presently in these areas. MRI negative. No formal assessment needed.      03/16/2023, 11:34 AM  [name] [name].Ed Architect [phone]

## 2023-03-16 NOTE — Progress Notes (Signed)
Daily Progress Note Intern Pager: [phone]  Patient name: Peter Mcmillan Medical record number: [id number] Date of birth: March 10, 1956 Age: 67 y.o. Gender: male  Primary Care Provider: Wilfrid Lund, PA Consultants: Neurology, Deboraha Sprang GI Code Status: FULL   Pt Overview and Major Events to Date:  12/11 - Admitted, stroke eval w/ Neuro, MRI, CTA neck, CT head, and CTAP all normal 12/12 - GI eval   Assessment and Plan: Peter Mcmillan is a 67 y.o. male with a pertinent PMH of CHF, Afib s/p ablation on dofetilide and Eliquis, and RA on prednisone and Humira who presented with L-sided numbness and BRBPR, admitted for stroke workup and GI eval.   Cleared for discharge per Dr. Bosie Clos of GI, can restart Eliquis and no concern for LGIB at this time.  Cleared for discharge by Dr. Roda Shutters of neurology, no concern for stroke and no further workup at this time.  Hemoglobin continues to be stable overnight and patient has no focal neurological deficit.  Will obtain TTE this afternoon, discharge afterwards and follow-up outpatient. Assessment & Plan Weakness No focal neurological deficit.  CT angio head and neck and CT head negative.  MRI head negative.  Unclear etiology, possible TIA, but more likely anxiety in setting of BRBPR per neurology. -f/u TTE -Appreciate neurology recs, starting atorvastatin 20 mg and no further management after echo obtained Bright red blood per rectum Single episode BRBPR prior to admission, low concern for active LGIB with stable Hgb overnight.  Follows with [facility], Dr. Dulce Sellar, and confirms colonoscopy 6 years ago. -Tolerated clear liquid diet, now advancing to regular diet -Restart Eliquis per GI -Appreciate [facility] recs, per Secure Chat with Dr. Bosie Clos can discharge with outpatient colonoscopy early 2025  Chronic and Stable Problems: A fib/A flutter: Hold Eliquis given concern for bleed, continue dofetilide 50 mg BID, metoprolol succinate 50 mg  BID HFpEF/HTN: Restart Entrestro 24-26 Rheumatoid arthritis: Restart prednisone 10 mg daily, last Humira dose 12/10 involving patient Tobacco use: Patient declined nicotine patch   FEN/GI: Clear liquid advancing to regular diet. PPx: Restart Eliquis per GI. Dispo: Home pending PM CBC and TTE results.  Subjective:  This morning, patient reports no abdominal pain, no nausea, no vomiting.  He has not had any further bowel movements, no hematochezia or melena.  Is feeling well overall and remains asymptomatic.  Objective: Temp:  [97.9 F (36.6 C)-98.3 F (36.8 C)] 98.1 F (36.7 C) (12/13 0320) Pulse Rate:  [66-96] 79 (12/13 0320) Resp:  [14-19] 16 (12/13 0320) BP: (110-154)/(74-102) 110/74 (12/13 0320) SpO2:  [93 %-100 %] 94 % (12/13 0320) Weight:  [97.2 kg] 97.2 kg (12/12 1318)  Physical Exam: General: Adult male, resting in bed comfortably, NAD. Eyes: No mucosal pallor. ENTM: MMM. Neck: No JVD. Cardiovascular: Normal rate, irregular rhythm.  Normal S1/S2. Respiratory: CTAB.  Normal work of breathing on room air. Gastrointestinal: No tenderness palpation all quadrants.  Normoactive bowel sounds.  No rebound or guarding.  No abdominal distention. MSK: Normal strength of all extremities, moving appropriately. Extremities: No peripheral edema bilaterally.  Capillary refill less than 2 seconds. Derm: No rashes grossly. Neuro: CNs II-XII intact.  Normal strength in all extremities bilaterally.  Good balance, gait steady.  FTN normal. Psych: Pleasant, appropriate.  Full range affect.  Normal mood.  Normal concentration and attention.  Laboratory: Most recent CBC Lab Results  Component Value Date   WBC 6.7 03/15/2023   HGB 14.8 03/15/2023   HCT 43.1 03/15/2023  MCV 89.6 03/15/2023   PLT 259 03/15/2023   Most recent BMP    Latest Ref Rng & Units 03/15/2023    4:42 AM  BMP  Glucose 70 - 99 mg/dL 89   BUN 8 - 23 mg/dL 10   Creatinine 5.78 - 1.24 mg/dL 4.69   Sodium 629 -  528 mmol/L 136   Potassium 3.5 - 5.1 mmol/L 4.3   Chloride 98 - 111 mmol/L 104   CO2 22 - 32 mmol/L 21   Calcium 8.9 - 10.3 mg/dL 8.7     Other pertinent labs: -None  New Imaging/Diagnostic Tests: -TTE: Pending  Peter Peine, MD 03/16/2023, 7:13 AM  PGY-1, [facility] FPTS Intern pager: [phone], text pages welcome Secure chat group [facility] Teaching Service

## 2023-03-16 NOTE — Assessment & Plan Note (Addendum)
No focal neurological deficit.  CT angio head and neck and CT head negative.  MRI head negative.  Unclear etiology, possible TIA, but more likely anxiety in setting of BRBPR per neurology. -f/u TTE -Appreciate neurology recs, starting atorvastatin 20 mg and no further management after echo obtained

## 2023-03-16 NOTE — Discharge Summary (Signed)
Family Medicine Teaching [facility] Discharge Summary  Patient name: Peter Mcmillan Medical record number: [id number] Date of birth: 11/03/1955 Age: 67 y.o. Gender: male Date of Admission: 03/14/2023  Date of Discharge: 03/16/2023 Admitting Physician: Penne Lash, MD  Primary Care Provider: Wilfrid Lund, PA Consultants: Gastroenterology, [facility]  Indication for Hospitalization: Unilateral numbness  Discharge Diagnoses/Problem List:  Principal Problem for Admission: Unilateral numbness Other Problems addressed during stay: Principal Problem:   Weakness Active Problems:   Bright red blood per rectum   Brief Hospital Course:  Peter Mcmillan is a 67 y.o.male with a history of Afib/A flutter, HF, RA, tobacco use who was admitted to the [facility] medicine Teaching Service at [facility] for hematochezia and left sided weakness. His hospital course is detailed below:  Left sided numbness Transient left-sided numbness lasting a couple of hours but had resolved by admission, most likely TIA vs vasovagal response given it occurred during BM.  CT and MRI negative for CVA.  Neurology followed, restarted on Eliquis and started statin.  Echo obtained and read pending at time of discharge. No neurologic deficits at the time of discharge and cleared for discharge per Dr. Roda Shutters, Neurology.  Bright red blood per rectum: One episode of bright red blood mixed with BM day prior to admission.  CTAP negative for acute bleed and Hgb remained stable.  [facility] followed, recommended outpatient follow-up with colonoscopy.  No further episodes of bleeding during admission.  Other chronic conditions were medically managed with home medications and formulary alternatives as necessary (A fib, RA, CHF).  PCP Follow-up Recommendations: GI recommending repeat colonoscopy early 2025. Titrate statin as needed. Started prednisone 10 mg daily per Rheum note 11/20, will need follow up.  Disposition:  Home  Discharge Condition: Stable, hemoglobin stable, no GI bleeding, no neurological deficit  Discharge Exam:  Vitals:   03/17/23 0700 03/17/23 1152  BP: 123/86 103/66  Pulse: 76 87  Resp: 16 18  Temp: 98.2 F (36.8 C) 99.7 F (37.6 C)  SpO2: 98% 96%   Physical Exam: General: Adult male, resting in bed comfortably, NAD. Eyes: No mucosal pallor. ENTM: MMM. Neck: No JVD. Cardiovascular: Normal rate, irregular rhythm.  Normal S1/S2. Respiratory: CTAB.  Normal work of breathing on room air. Gastrointestinal: No tenderness palpation all quadrants.  Normoactive bowel sounds.  No rebound or guarding.  No abdominal distention. MSK: Normal strength of all extremities, moving appropriately. Extremities: No peripheral edema bilaterally.  Capillary refill less than 2 seconds. Derm: No rashes grossly. Neuro: CNs II-XII intact.  Normal strength in all extremities bilaterally.  Good balance, gait steady.  FTN normal. Psych: Pleasant, appropriate.  Full range affect.  Normal mood.  Normal concentration and attention.  Significant Procedures: None  Significant Labs and Imaging:  Recent Labs  Lab 03/15/23 1426 03/16/23 0631  WBC 6.7 6.7  HGB 14.8 14.2  HCT 43.1 41.6  PLT 259 264   No results for input(s): "NA", "K", "CL", "CO2", "GLUCOSE", "BUN", "CREATININE", "CALCIUM", "MG", "PHOS", "ALKPHOS", "AST", "ALT", "ALBUMIN", "PROTEIN" in the last 48 hours.  -A1c 5.8 -LDL 81 -FOBT negative  -CT head: Normal. -CTA head/neck: Minimal atherosclerosis, no occlusion. -CTA abdomen: No active GI bleed. Mild sigmoid diverticulosis. -MRI brain: No acute intracranial abnormality. -TTE: Pending.  Results/Tests Pending at Time of Discharge: TTE  Discharge Medications:  Allergies as of 03/17/2023       Reactions   Codeine Other (See Comments)   agitation   Other Hives, Swelling, Other (See Comments)  make feel weird; hives/swelling   Penicillin G Other (See Comments)   Penicillins  Itching, Swelling, Rash   Reaction: 5 years ago        Medication List     STOP taking these medications    potassium chloride 10 MEQ tablet Commonly known as: KLOR-CON       TAKE these medications    atorvastatin 20 MG tablet Commonly known as: LIPITOR Take 1 tablet (20 mg total) by mouth daily.   Cholecalciferol 100 MCG (4000 UT) Caps Take 1 capsule (4,000 Units total) by mouth daily in the afternoon.   dofetilide 250 MCG capsule Commonly known as: TIKOSYN TAKE ONE CAPSULE BY MOUTH TWICE DAILY   Eliquis 5 MG Tabs tablet Generic drug: apixaban TAKE ONE TABLET BY MOUTH TWICE A DAY   Entresto 24-26 MG Generic drug: sacubitril-valsartan TAKE ONE TABLET BY MOUTH TWICE (2) DAILY   folic acid 1 MG tablet Commonly known as: FOLVITE Take 1 tablet (1 mg total) by mouth daily.   Humira (2 Pen) 40 MG/0.4ML pen Generic drug: adalimumab Inject 0.4 mLs (40 mg total) into the skin every 14 (fourteen) days. 1 kit - 2 pens   loratadine 10 MG tablet Commonly known as: CLARITIN Take 10 mg by mouth daily as needed for allergies.   Magnesium 200 MG Tabs TAKE ONE TABLET BY MOUTH ONCE A DAY   metoprolol succinate 50 MG 24 hr tablet Commonly known as: TOPROL-XL Take 1 tablet (50 mg total) by mouth 2 (two) times daily. Appt req for refill [id number] What changed: See the new instructions.   omeprazole 40 MG capsule Commonly known as: PRILOSEC Take 40 mg by mouth daily.   predniSONE 10 MG tablet Commonly known as: DELTASONE Take 1 tablet (10 mg total) by mouth daily with breakfast.   VITAMIN D PO Take 2 tablets by mouth in the morning.        Discharge Instructions: Please refer to Patient Instructions section of EMR for full details.  Patient was counseled important signs and symptoms that should prompt return to medical care, changes in medications, dietary instructions, activity restrictions, and follow up appointments.  Follow-Up Appointments: Future  Appointments  Date Time Provider Department Center  04/23/2023  2:40 PM Dimple Casey, Jamesetta Orleans, MD [facility] None    Sharion Dove, Howard Patton, MD 03/17/2023, 12:05 PM PGY-1, [facility]

## 2023-03-16 NOTE — Assessment & Plan Note (Addendum)
Single episode BRBPR prior to admission, low concern for active LGIB with stable Hgb overnight.  Follows with [facility], Dr. Dulce Sellar, and confirms colonoscopy 6 years ago. -Tolerated clear liquid diet, now advancing to regular diet -Restart Eliquis per GI -Appreciate [facility] recs, per Secure Chat with Dr. Bosie Clos can discharge with outpatient colonoscopy early 2025

## 2023-03-16 NOTE — Plan of Care (Signed)
  Problem: Education: Goal: Knowledge of disease or condition will improve Outcome: Progressing   Problem: Ischemic Stroke/TIA Tissue Perfusion: Goal: Complications of ischemic stroke/TIA will be minimized Outcome: Progressing   

## 2023-03-17 ENCOUNTER — Observation Stay (HOSPITAL_BASED_OUTPATIENT_CLINIC_OR_DEPARTMENT_OTHER): Payer: Commercial Managed Care - PPO

## 2023-03-17 DIAGNOSIS — I4891 Unspecified atrial fibrillation: Secondary | ICD-10-CM | POA: Diagnosis not present

## 2023-03-17 DIAGNOSIS — R200 Anesthesia of skin: Secondary | ICD-10-CM | POA: Diagnosis not present

## 2023-03-17 DIAGNOSIS — R531 Weakness: Secondary | ICD-10-CM | POA: Diagnosis not present

## 2023-03-17 LAB — ECHOCARDIOGRAM COMPLETE
AR max vel: 2.8 cm2
AV Area VTI: 2.74 cm2
AV Area mean vel: 2.6 cm2
AV Mean grad: 3 mm[Hg]
AV Peak grad: 4.9 mm[Hg]
Ao pk vel: 1.11 m/s
Area-P 1/2: 4.06 cm2
Calc EF: 59 %
Height: 77 in
S' Lateral: 2.8 cm
Single Plane A2C EF: 58.6 %
Single Plane A4C EF: 56.2 %
Weight: 3427.2 [oz_av]

## 2023-03-17 NOTE — Progress Notes (Signed)
Echocardiogram 2D Echocardiogram has been performed.  Martyna Thorns N Maxene Byington,RDCS 03/17/2023, 2:54 PM

## 2023-03-19 ENCOUNTER — Telehealth: Payer: Self-pay | Admitting: Internal Medicine

## 2023-03-19 ENCOUNTER — Other Ambulatory Visit (HOSPITAL_COMMUNITY): Payer: Self-pay

## 2023-03-19 ENCOUNTER — Telehealth: Payer: Self-pay

## 2023-03-19 NOTE — Telephone Encounter (Signed)
Patient is requesting call back to discuss results.  

## 2023-03-19 NOTE — Telephone Encounter (Signed)
Patient contacted the office and states he has received a call from insurance stating he will start having to pay $2000 for his Humira. Patient states the Humira is supposed to be filed under his work [employer] and not his [employer] officer, trade union and he did not know if that was what is wrong. Please advise.

## 2023-03-19 NOTE — Telephone Encounter (Addendum)
I provided [facility] with employer insurance on 03/09/2023. Contacted [facility] again today. They verified benefits under incorrect insurance. They have placed urgent request to verify the benefits with commercial plan  I requested [facility] remove Medicare plan altogether but they are unable to do so once added  Turnaround time is approximately 72 business hours for an expedited request  Chesley Mires, PharmD, MPH, BCPS, CPP Clinical Pharmacist (Rheumatology and Pulmonology)

## 2023-03-19 NOTE — Telephone Encounter (Signed)
Pt called requesting to speak with Addie. Pt did not give me further reason. Call back number is [phone].

## 2023-03-19 NOTE — Telephone Encounter (Signed)
Refer to phone call note from today, 03/19/2023, to pharmacy team.

## 2023-03-19 NOTE — Telephone Encounter (Signed)
Pt was in the ED after having blood in his stool and when he noticed it, he felt light headed and weak on one side, he says he went numb... they r/o CVA and did an Echo and pt is asking for Dr Tenny Craw to look at the Echo. He is seeing his PCP for GI referral.

## 2023-03-21 NOTE — Telephone Encounter (Signed)
I have reviewed echo Looks good  Normal ventricular function     Overall normal valve function

## 2023-03-21 NOTE — Telephone Encounter (Signed)
Per [facility] rep, insurance verification is still in process. They said to allow until tomororw morning  Chesley Mires, PharmD, MPH, BCPS, CPP Clinical Pharmacist (Rheumatology and Pulmonology)

## 2023-03-22 NOTE — Telephone Encounter (Signed)
Pt advised his Echo results and he will talk with his PCP at his OV 03/26/23 to see if he needs to start back any of his supplements. He specifically asked about the Magnesium and K.Marland Kitchen

## 2023-03-22 NOTE — Telephone Encounter (Signed)
Per [facility] rep, Humira rx is now with clearance time and will take additional time. Will f/u

## 2023-03-23 NOTE — Telephone Encounter (Signed)
Per [facility] rep, Humira insurance BIV STILL being processed

## 2023-03-25 ENCOUNTER — Other Ambulatory Visit: Payer: Self-pay | Admitting: Rheumatology

## 2023-03-25 MED ORDER — PREDNISONE 10 MG PO TABS: 10.0000 mg | ORAL_TABLET | Freq: Every day | ORAL | 0 refills | Status: DC

## 2023-03-25 NOTE — Progress Notes (Signed)
Prescription for prednisone was sent per patient's request.

## 2023-03-25 NOTE — Telephone Encounter (Signed)
I received a phone call from the answering service about the patient at 10:30 AM.  I returned patient's call.  Patient stated that he was recently hospitalized due to rectal bleeding.  Colonoscopy is scheduled.  He stated that he has taken 2 doses of Humira and has not noticed any improvement in his symptoms.  He ran out of prednisone today.  He states he has been having increased pain and swelling in his joints to the point he is having difficulty getting out of bed.  He requested a prescription refill on prednisone until he can be seen by Dr. Dimple Casey.  He stated that he would not be able to afford Humira.  I advised him to call the office on December 23, Monday morning to discuss treatment plan with Dr. Dimple Casey.  I sent a prescription refill on prednisone 10 mg p.o. daily.  I advised him to take 2 tablets for 3-4 days until symptoms improve then he can go to 10 mg p.o. daily.  Side effects of prednisone including increased risk of hypertension, diabetes, weight gain cataracts, osteoporosis were discussed.  Patient voiced understanding.  Prescription refill was sent. Pollyann Savoy, MD

## 2023-03-26 ENCOUNTER — Telehealth: Payer: Self-pay | Admitting: Pharmacist

## 2023-03-26 ENCOUNTER — Telehealth: Payer: Self-pay | Admitting: Internal Medicine

## 2023-03-26 ENCOUNTER — Encounter: Payer: Self-pay | Admitting: Family Medicine

## 2023-03-26 NOTE — Telephone Encounter (Signed)
I've been in contact with [facility] almost daily for the past two weeks about Humira because they were billing rx through his Medicare. They are STILL working on his case as of this morning and now escalating to leadership  As of today, they are saying patient can call to schedule the shipment through his employer's insurance now. They are not able to disclose the copay to [employer]  Spoke with patient. He states he does not want to be on prednisone long-term but I advised that I cannot make the pharmacy move any faster. I have requested expedition and escalation of case every day for 2 weeks  He plans to pick up Humira sample from clinic today around 11am. Marked in fridge  Chesley Mires, PharmD, MPH, BCPS, CPP Clinical Pharmacist (Rheumatology and Pulmonology)

## 2023-03-26 NOTE — Telephone Encounter (Signed)
Medication Samples have been provided to the patient.  Drug name: Humira 40mg /0.25mL pen injector Qty: 1 pen LOT: [id number]  Exp.Date: 01/2024  Dosing instructions: Inject 40mg  into the skin. DO NOT REFRIGERATE  The patient has been instructed regarding the correct time, dose, and frequency of taking this medication, including desired effects and most common side effects.   Murrell Redden 11:37 AM 03/26/2023

## 2023-03-26 NOTE — Telephone Encounter (Signed)
Per [facility] rep, request is now being expedited through leadership team. If patient calls to schedule shipment today, it will still be processed through his Medicare plan  He is planning to pick up sample today from clinic  Chesley Mires, PharmD, MPH, BCPS, CPP Clinical Pharmacist (Rheumatology and Pulmonology)

## 2023-03-26 NOTE — Telephone Encounter (Signed)
Patient is currently prescribed Humira and Prednisone. Dr. Corliss Skains sent in a prescription of prednisone 10 mg daily yesterday. Attempted to contact the patient to advise him of the prednisone prescription that was sent and left a message to call the office back. Please advise.

## 2023-03-26 NOTE — Telephone Encounter (Signed)
Peter Mcmillan called requesting relief for pain. He recently switched medication and would like to discuss possibly adjusting the medication. He is having trouble moving and walking. He can be reached at [phone].

## 2023-03-29 NOTE — Telephone Encounter (Signed)
When patietn picked up sample, he stated that he is losing employer insurance in January. Will need to utilize [employer] PAP. Application is ready to submit after new year  Chesley Mires, PharmD, MPH, BCPS, CPP Clinical Pharmacist (Rheumatology and Pulmonology)

## 2023-03-29 NOTE — Telephone Encounter (Signed)
Per [facility] portal, shipment of Humira has been schedled to patient through employer plan on 04/02/2023. Delivery to patient should be on 04/03/2023  Chesley Mires, PharmD, MPH, BCPS, CPP Clinical Pharmacist (Rheumatology and Pulmonology)

## 2023-04-10 ENCOUNTER — Other Ambulatory Visit (HOSPITAL_COMMUNITY): Payer: Self-pay | Admitting: Physician Assistant

## 2023-04-10 DIAGNOSIS — I4819 Other persistent atrial fibrillation: Secondary | ICD-10-CM

## 2023-04-10 NOTE — Progress Notes (Signed)
Office Visit Note  Patient: Peter Mcmillan             Date of Birth: Apr 13, 1955           MRN: [id number]             PCP: Wilfrid Lund, PA Referring: Wilfrid Lund, [place] Visit Date: 04/23/2023   Subjective:  Follow-up (Patient states he is tired of hurting. Patient states he does not feel the Humira is helping. Patient states the Humira has made his constant cough worse. )    Discussed the use of AI scribe software for clinical note transcription with the patient, who gave verbal consent to proceed.  History of Present Illness   Granville Whitefield is a 68 y.o. male here for follow up for follow up for seropositive RA. He presents with persistent joint pain and discomfort. He reports having taken four doses of Humira since the end of December, with another dose scheduled for the following week. Prior to this, he was on a daily regimen of prednisone 10mg , which he discontinued three weeks ago due to concerns about weight gain and potential cardiovascular risks.  Since discontinuing prednisone, the patient reports worsening arthritis symptoms, particularly in his hands and knees. He describes the pain as constant and sometimes so severe that it impedes his ability to perform daily tasks, including work. He also reports coldness in his feet, particularly at night, which he manages by wearing socks or heating a towel.  The patient expresses a strong desire to avoid opioid pain medications due to personal experiences with addiction in his family. He recalls a previous positive experience with gabapentin for pain management following an ankle injury and expresses interest in trying it again for his current symptoms.  The patient also mentions a recent hospital visit and upcoming colonoscopy, scheduled due to a recent episode of gastrointestinal bleeding.   Previous HPI 02/21/2023 Peter Mcmillan is a 68 y.o. male here for follow up for seropositive RA currently he is off  medications.  He was taking methotrexate 25 mg p.o. weekly but stopped taking this medicine for several weeks because he had not seen any noticeable benefit despite a long treatment course.  He is also off of prednisone since 2 weeks after completing the last prescription.  He is now having a flareup with joint pain stiffness and swelling in multiple areas started since Sunday 3 days ago.  He is also concerned because of most severe pain is in his feet and with extensive swelling throughout the feet and in his shins which has previously been a symptom when he was in A-fib with RVR. He discussed with his dermatologist about possibly starting biologic and after detailed skin exam and his history of melanoma they felt this was not currently contraindicated.   11/22/22 Peter Mcmillan is a 68 y.o. male here for follow up for seropositive RA on methotrexate 25 mg p.o. weekly folic acid 1 mg daily and prednisone 10 mg daily unfortunately still having significant joint pain and stiffness on a daily basis.  Currently his biggest complaint is with the recurrence of elbow pain and swelling with a very large lump especially on the left side.  This is bothersome because it is crating a pressure area and hitting against things as well as pain in the joint and difficulty fully extending his elbow.   08/21/2022 Peter Mcmillan is a 68 y.o. male here for follow-up for seropositive RA after resuming methotrexate  25 mg p.o. weekly folic acid 1 mg daily and initial treatment with prednisone taper starting from 20 mg over 12 days.  Initially had benefit while taking the prednisone since stopping it he immediately saw an increase in joint pain affecting bilateral hands and feet again has also developed swelling overlying the elbows and on anterior of both knees.  Very prolonged stiffness on a daily basis beginning after waking lasting until around 3 to 4 PM.   07/24/22 Peter Mcmillan is a 68 y.o. male here  for follow up for seropositive RA previously on methotrexate but has been out of the medication for about 2 months was taking 25 mg p.o. weekly he still on the folic acid 1 mg daily.  He has been experiencing increased joint pain and swelling in his hands shoulders and knees more severe on the right side than left.  Did not recall any other illness or injury prior to symptom worsening.  We spoke by phone 2 days ago and started on prednisone taper he is on 20 mg today is day 3 so far seeing a partial improvement joint pain and swelling is decreased but still has prolonged morning stiffness and still having some swelling in that right hand and shoulder.  He had recent cardiology follow-up everything is doing well and not in A-fib.   09/16/21 Peter Mcmillan is a 68 y.o. 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  Hand pain is pretty stable without any major flareup or exacerbation.  His current complaint is due to right elbow swelling.  He developed painful bursitis this was evaluated at urgent care clinic last month and again about a week ago.  Not particularly hot or inflamed but is painful and just remains swollen.  He does not recall any trauma preceding the onset of the swelling.   06/13/2021 Peter Mcmillan is a 68 y.o. male here for follow up for seropositive RA on methotrexate 15 mg PO weekly after last visit trying adding turmeric and continuing off the [place]. He missed one does of methotrexate due to running out of his medication and now has increased symptoms with bilateral hand pain and swelling. Right hand is worst affected and difficult to use normally. He also has a new cough ongoing for the past month it is not productive. Cough is most common when talking, when sitting and eating, or after he lies down in bed to sleep. He is not yet taking any seasonal allergy medication for the year. He is not taking any cough medicines.   05/04/21 Peter Ehrler  Mcmillan is a 68 y.o. 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 weekly.  He stopped taking the Orencia injections for about a month ever since he was hospitalized for his A. fib.  He feels there was not much difference when he took the shots and has not noticed much difference since stopping them.  He continues having some joint pain stiffness and swelling mostly affecting his hands and knees.  He is curious about any other anti-inflammatory medicine or supplements to try in addition to his RA medicines.   Previous HPI 09/23/20 Leston Schueller is a 68 y.o. male here for follow-up of seropositive rheumatoid arthritis on methotrexate 15 mg p.o. weekly and prednisone 20 mg daily.  He feels his symptoms have improved taking the medications although worsened greatly when he decrease his prednisone down to 10 mg as directed and so  went back to taking 20 daily.  He has been out of the methotrexate since about 2 weeks ago.   Review of Systems  Constitutional:  Positive for fatigue.  HENT:  Negative for mouth sores and mouth dryness.   Eyes:  Negative for dryness.  Respiratory:  Positive for shortness of breath.   Cardiovascular:  Negative for chest pain and palpitations.  Gastrointestinal:  Positive for blood in stool, constipation and diarrhea.  Endocrine: Negative for increased urination.  Genitourinary:  Negative for involuntary urination.  Musculoskeletal:  Positive for joint pain, gait problem, joint pain, joint swelling, myalgias, muscle weakness, morning stiffness, muscle tenderness and myalgias.  Skin:  Negative for color change, rash, hair loss and sensitivity to sunlight.  Allergic/Immunologic: Negative for susceptible to infections.  Neurological:  Negative for dizziness and headaches.  Hematological:  Negative for swollen glands.  Psychiatric/Behavioral:  Positive for depressed mood and sleep disturbance. The patient is nervous/anxious.     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   Essential hypertension 06/11/2020   Gastro-esophageal reflux disease without esophagitis 06/11/2020   Hyperlipidemia 06/11/2020   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   Prediabetes 06/11/2020   Tobacco user 06/11/2020   Vitamin D deficiency 06/11/2020   Bilateral hand pain 06/11/2020   Bilateral shoulder pain 06/11/2020   Atrial flutter (HCC) 12/09/2013   PULMONARY NODULE 08/18/2008   COUGH 08/18/2008    Past Medical History:  Diagnosis Date   Atrial flutter (HCC)    Bursitis of right elbow    Erectile dysfunction    Rheumatoid arthritis (HCC)    Tobacco abuse    Smokeless tobacco    Family History  Problem Relation Age of Onset   Heart attack Father    Heart attack Mother    Dementia Mother    Healthy Sister    Healthy Son    Past Surgical History:  Procedure Laterality Date   ATRIAL FIBRILLATION ABLATION N/A 12/17/2020   Procedure: ATRIAL FIBRILLATION ABLATION;  Surgeon: Lanier Prude, MD;  Location: [facility] INVASIVE CV LAB;  Service: Cardiovascular;  Laterality: N/A;   BACK SURGERY  1999   CARDIOVERSION N/A 10/06/2020   Procedure: CARDIOVERSION;  Surgeon: Little Ishikawa, MD;  Location: [facility] ENDOSCOPY;  Service: Cardiovascular;  Laterality: N/A;   MELANOMA EXCISION  2020   TEE WITHOUT CARDIOVERSION N/A 10/06/2020   Procedure: TRANSESOPHAGEAL ECHOCARDIOGRAM (TEE);  Surgeon: Little Ishikawa, MD;   Location: [facility] ENDOSCOPY;  Service: Cardiovascular;  Laterality: N/A;   TEE WITHOUT CARDIOVERSION N/A 12/17/2020   Procedure: TRANSESOPHAGEAL ECHOCARDIOGRAM (TEE);  Surgeon: Sande Rives, MD;  Location: [facility] INVASIVE CV LAB;  Service: Cardiovascular;  Laterality: N/A;   Social History   Social History Narrative   Not on file   Immunization History  Administered Date(s) Administered   Hep A / Hep B 02/17/2011   Influenza Split 01/29/2017   Moderna Sars-Covid-2 Vaccination 07/09/2019, 07/09/2019, 08/06/2019, 08/06/2019   Pneumococcal Polysaccharide-23 02/12/2009   Tdap 02/12/2009, 06/13/2017, 06/18/2020  Objective: Vital Signs: BP 119/78 (BP Location: Left Arm, Patient Position: Sitting, Cuff Size: Large)   Pulse (!) 45   Resp 14   Ht 6\' 5"  (1.956 m)   Wt 223 lb (101.2 kg)   BMI 26.44 kg/m    Physical Exam Eyes:     Conjunctiva/sclera: Conjunctivae normal.  Cardiovascular:     Rate and Rhythm: Regular rhythm. Bradycardia present.  Pulmonary:     Effort: Pulmonary effort is normal.     Breath sounds: Normal breath sounds.  Musculoskeletal:     Right lower leg: No edema.     Left lower leg: No edema.  Lymphadenopathy:     Cervical: No cervical adenopathy.  Skin:    General: Skin is warm and dry.     Findings: No rash.  Neurological:     Mental Status: He is alert.  Psychiatric:        Mood and Affect: Mood normal.      Musculoskeletal Exam:  Shoulders full ROM no tenderness or swelling Elbows full ROM no tenderness or swelling Bilateral wrist tenderness no palpable effusion Right MCP joints chronic thickening, lateral deviation, MCP and PIP tenderness to pressure, slightly limited in flexion and extension ROM Knees full ROM no tenderness or swelling Very large 1st MTP nodules, thick plantar callus   Investigation: No additional findings.  Imaging: XR Hand 2 View Left Result Date: 04/23/2023 X-ray left hand 2 views Radiocarpal joint space appears  normal.  Moderate for [facility] joint degenerative change with bone spurring and mild subluxation.  Multiple cystic changes present throughout the carpal bones similar compared to previous 2022 hand x-ray.  MCP PIP and DIP joint spaces appear overall normal.  Bone mineralization is suggestive for possible generalized osteopenia appears worse from prior x-ray  XR Hand 2 View Right Result Date: 04/23/2023 X-ray right hand 2 views Radiocarpal and carpal joint space.  Normal.  Multiple cystic changes present in the carpal bones similar compared to previous.  There is progression of MCP joint subluxation and lateral deviation compared to 2022 hand x-ray.  Multiple subchondral cystic changes at metacarpal heads are new.  No visible joint erosions.  Bone mineralization suggestive of generalized osteopenia which appears worse.    Recent Labs: Lab Results  Component Value Date   WBC 6.8 04/23/2023   HGB 15.4 04/23/2023   PLT 334 04/23/2023   NA 138 04/23/2023   K 4.7 04/23/2023   CL 102 04/23/2023   CO2 26 04/23/2023   GLUCOSE 102 (H) 04/23/2023   BUN 12 04/23/2023   CREATININE 0.96 04/23/2023   BILITOT 0.5 04/23/2023   ALKPHOS 42 03/14/2023   AST 18 04/23/2023   ALT 20 04/23/2023   PROT 7.2 04/23/2023   ALBUMIN 3.0 (L) 03/14/2023   CALCIUM 9.4 04/23/2023   GFRAA 111 09/23/2020   QFTBGOLDPLUS NEGATIVE 08/21/2022    Speciality Comments: No specialty comments available.  Procedures:  No procedures performed Allergies: Codeine, Other, Penicillin g, and Penicillins   Assessment / Plan:     Visit Diagnoses: Seropositive rheumatoid arthritis (HCC) - Plan: XR Hand 2 View Right, XR Hand 2 View Left, Sedimentation rate, C-reactive protein, gabapentin (NEURONTIN) 300 MG capsule Increased joint pain and swelling in hands and knees since discontinuing prednisone three weeks ago. Started Humira two months ago, but no significant improvement noted yet. Patient has concerns about long-term steroid use due  to weight gain and potential cardiovascular risks. -Continue Humira, reassess efficacy after three months of use. -Start Gabapentin 300mg   at night for pain control, with potential to increase to two or three times daily if beneficial. -Order blood work to assess response to Humira.  High risk medication use - Plan: CBC with Differential/Platelet, COMPLETE METABOLIC PANEL WITH GFR No serious interval infections. Doubtful if leg swelling is related, has bee ongoing problem preceding treatment. -Checking CBC and CMP medication monitoring on Humira.  Cold Feet Patient reports cold feet, particularly at night, causing discomfort. Will see if benefiting from gabapentin start as above. Not typical pattern for RA activity.  General Health Maintenance -Has scheduled colonoscopy with gastroenterologist.     Orders: Orders Placed This Encounter  Procedures   XR Hand 2 View Right   XR Hand 2 View Left   Sedimentation rate   C-reactive protein   CBC with Differential/Platelet   COMPLETE METABOLIC PANEL WITH GFR   Meds ordered this encounter  Medications   gabapentin (NEURONTIN) 300 MG capsule    Sig: Take 1 capsule (300 mg total) by mouth at bedtime.    Dispense:  60 capsule    Refill:  0     Follow-Up Instructions: Return in about 2 months (around 06/21/2023) for RA on ADA/+gabapentin f/u 2mos.   Fuller Plan, MD  Note - This record has been created using [employer].  Chart creation errors have been sought, but may not always  have been located. Such creation errors do not reflect on  the standard of medical care.

## 2023-04-23 ENCOUNTER — Ambulatory Visit: Payer: Commercial Managed Care - PPO | Attending: Internal Medicine | Admitting: Internal Medicine

## 2023-04-23 ENCOUNTER — Ambulatory Visit: Payer: Commercial Managed Care - PPO

## 2023-04-23 ENCOUNTER — Encounter: Payer: Self-pay | Admitting: Internal Medicine

## 2023-04-23 VITALS — BP 119/78 | HR 45 | Resp 14 | Ht 77.0 in | Wt 223.0 lb

## 2023-04-23 DIAGNOSIS — M79641 Pain in right hand: Secondary | ICD-10-CM

## 2023-04-23 DIAGNOSIS — M79642 Pain in left hand: Secondary | ICD-10-CM

## 2023-04-23 DIAGNOSIS — M059 Rheumatoid arthritis with rheumatoid factor, unspecified: Secondary | ICD-10-CM

## 2023-04-23 DIAGNOSIS — Z7952 Long term (current) use of systemic steroids: Secondary | ICD-10-CM

## 2023-04-23 DIAGNOSIS — Z79899 Other long term (current) drug therapy: Secondary | ICD-10-CM | POA: Diagnosis not present

## 2023-04-23 MED ORDER — GABAPENTIN 300 MG PO CAPS: 300.0000 mg | ORAL_CAPSULE | Freq: Every day | ORAL | 0 refills | Status: DC

## 2023-04-24 LAB — CBC WITH DIFFERENTIAL/PLATELET
Absolute Lymphocytes: 1414 {cells}/uL (ref 850–3900)
Absolute Monocytes: 598 {cells}/uL (ref 200–950)
Basophils Absolute: 48 {cells}/uL (ref 0–200)
Basophils Relative: 0.7 %
Eosinophils Absolute: 88 {cells}/uL (ref 15–500)
Eosinophils Relative: 1.3 %
HCT: 46.6 % (ref 38.5–50.0)
Hemoglobin: 15.4 g/dL (ref 13.2–17.1)
MCH: 29.7 pg (ref 27.0–33.0)
MCHC: 33 g/dL (ref 32.0–36.0)
MCV: 89.8 fL (ref 80.0–100.0)
MPV: 10.3 fL (ref 7.5–12.5)
Monocytes Relative: 8.8 %
Neutro Abs: 4651 {cells}/uL (ref 1500–7800)
Neutrophils Relative %: 68.4 %
Platelets: 334 10*3/uL (ref 140–400)
RBC: 5.19 10*6/uL (ref 4.20–5.80)
RDW: 11.7 % (ref 11.0–15.0)
Total Lymphocyte: 20.8 %
WBC: 6.8 10*3/uL (ref 3.8–10.8)

## 2023-04-24 LAB — COMPLETE METABOLIC PANEL WITH GFR
AG Ratio: 1.2 (calc) (ref 1.0–2.5)
ALT: 20 U/L (ref 9–46)
AST: 18 U/L (ref 10–35)
Albumin: 3.9 g/dL (ref 3.6–5.1)
Alkaline phosphatase (APISO): 50 U/L (ref 35–144)
BUN: 12 mg/dL (ref 7–25)
CO2: 26 mmol/L (ref 20–32)
Calcium: 9.4 mg/dL (ref 8.6–10.3)
Chloride: 102 mmol/L (ref 98–110)
Creat: 0.96 mg/dL (ref 0.70–1.35)
Globulin: 3.3 g/dL (ref 1.9–3.7)
Glucose, Bld: 102 mg/dL — ABNORMAL HIGH (ref 65–99)
Potassium: 4.7 mmol/L (ref 3.5–5.3)
Sodium: 138 mmol/L (ref 135–146)
Total Bilirubin: 0.5 mg/dL (ref 0.2–1.2)
Total Protein: 7.2 g/dL (ref 6.1–8.1)
eGFR: 87 mL/min/{1.73_m2} (ref >=60)

## 2023-04-24 LAB — C-REACTIVE PROTEIN: CRP: 56.2 mg/L — ABNORMAL HIGH (ref <8.0)

## 2023-04-24 LAB — SEDIMENTATION RATE: Sed Rate: 55 mm/h — ABNORMAL HIGH (ref 0–20)

## 2023-04-24 NOTE — Progress Notes (Signed)
Sedimentation rate is increasing up to 55 and CRP remains high at 56.2.  These both suggest very active rheumatoid arthritis.  The update x-ray of his hands shows significant progression of joint deformity especially across the knuckles on his right hand.  I think if he does not see noticeable improvement by 1 more month on Humira we need to change drugs.  We could go ahead and move his follow-up appointment to be a month sooner in case we do need that.

## 2023-04-25 ENCOUNTER — Telehealth: Payer: Self-pay

## 2023-04-25 NOTE — Telephone Encounter (Signed)
Patient and Patient's wife, Arline Asp, contacted the office regarding the labs and xrays gone over yesterday. Arline Asp states she was concerned because she thought there were going to be major changes. Arliss Journey and the patient of the results in the lab note from 04/23/2023. Cindy and the patient verbalized understanding.

## 2023-05-05 ENCOUNTER — Other Ambulatory Visit (HOSPITAL_COMMUNITY): Payer: Self-pay | Admitting: Physician Assistant

## 2023-05-08 ENCOUNTER — Other Ambulatory Visit: Payer: Self-pay | Admitting: Internal Medicine

## 2023-05-08 DIAGNOSIS — M059 Rheumatoid arthritis with rheumatoid factor, unspecified: Secondary | ICD-10-CM

## 2023-05-08 DIAGNOSIS — Z79899 Other long term (current) drug therapy: Secondary | ICD-10-CM

## 2023-05-10 ENCOUNTER — Telehealth: Payer: Self-pay | Admitting: *Deleted

## 2023-05-10 NOTE — Telephone Encounter (Signed)
   Pre-operative Risk Assessment    Patient Name: Peter Mcmillan  DOB: September 17, 1955 MRN: [id number]   Date of last office visit: 02/27/23 DR. ROSS  Date of next office visit: NONE   Request for Surgical Clearance    Procedure:   COLONOSCOPY/ENDOSCOPY  Date of Surgery:  Clearance 05/22/23 STAT                               Surgeon:  DR. ELICIA Surgeon's Group or Practice Name:  [facility] Phone number:  [phone] Fax number:  [phone]   Type of Clearance Requested:   - Medical  - Pharmacy:  Hold Apixaban  (Eliquis )     Type of Anesthesia:   PROPOFOL     Additional requests/questions:    Bonney Niels Jest   05/10/2023, 5:55 PM

## 2023-05-11 NOTE — Progress Notes (Signed)
 Office Visit Note  Patient: Peter Mcmillan             Date of Birth: 10-24-1955           MRN: [id number]             PCP: Wilfrid Lund, PA Referring: Wilfrid Lund, [place] Visit Date: 05/25/2023   Subjective:  Follow-up (Patient states he was advised the Humira is no longer on his insurance list. Patient states he has not taken the Humira yet because it has not shown up. )   History of Present Illness: Peter Mcmillan is a 68 y.o. male here for follow up for seropositive RA on Humira 40 mg subcu q. 14 days.  He has now taken about 4 doses in total and does feel like symptoms are getting better compared to our initial follow-up only 1 month into treatment.  Seeing a partial improvement in pain and swelling affecting both hands.  He is also discontinued the prednisone completely again without a severe exacerbation of symptoms.   Previous HPI 04/23/2023 Peter Mcmillan is a 68 y.o. male here for follow up for follow up for seropositive RA. He presents with persistent joint pain and discomfort. He reports having taken four doses of Humira since the end of December, with another dose scheduled for the following week. Prior to this, he was on a daily regimen of prednisone 10mg , which he discontinued three weeks ago due to concerns about weight gain and potential cardiovascular risks.   Since discontinuing prednisone, the patient reports worsening arthritis symptoms, particularly in his hands and knees. He describes the pain as constant and sometimes so severe that it impedes his ability to perform daily tasks, including work. He also reports coldness in his feet, particularly at night, which he manages by wearing socks or heating a towel.   The patient expresses a strong desire to avoid opioid pain medications due to personal experiences with addiction in his family. He recalls a previous positive experience with gabapentin for pain management following an ankle injury and  expresses interest in trying it again for his current symptoms.   The patient also mentions a recent hospital visit and upcoming colonoscopy, scheduled due to a recent episode of gastrointestinal bleeding.     Previous HPI 02/21/2023 Peter Mcmillan is a 68 y.o. male here for follow up for seropositive RA currently he is off medications.  He was taking methotrexate 25 mg p.o. weekly but stopped taking this medicine for several weeks because he had not seen any noticeable benefit despite a long treatment course.  He is also off of prednisone since 2 weeks after completing the last prescription.  He is now having a flareup with joint pain stiffness and swelling in multiple areas started since Sunday 3 days ago.  He is also concerned because of most severe pain is in his feet and with extensive swelling throughout the feet and in his shins which has previously been a symptom when he was in A-fib with RVR. He discussed with his dermatologist about possibly starting biologic and after detailed skin exam and his history of melanoma they felt this was not currently contraindicated.   11/22/22 Peter Mcmillan is a 68 y.o. male here for follow up for seropositive RA on methotrexate 25 mg p.o. weekly folic acid 1 mg daily and prednisone 10 mg daily unfortunately still having significant joint pain and stiffness on a daily basis.  Currently his biggest complaint is  with the recurrence of elbow pain and swelling with a very large lump especially on the left side.  This is bothersome because it is crating a pressure area and hitting against things as well as pain in the joint and difficulty fully extending his elbow.   08/21/2022 Peter Mcmillan is a 68 y.o. male here for follow-up for seropositive RA after resuming methotrexate 25 mg p.o. weekly folic acid 1 mg daily and initial treatment with prednisone taper starting from 20 mg over 12 days.  Initially had benefit while taking the prednisone  since stopping it he immediately saw an increase in joint pain affecting bilateral hands and feet again has also developed swelling overlying the elbows and on anterior of both knees.  Very prolonged stiffness on a daily basis beginning after waking lasting until around 3 to 4 PM.   07/24/22 Peter Mcmillan is a 68 y.o. male here for follow up for seropositive RA previously on methotrexate but has been out of the medication for about 2 months was taking 25 mg p.o. weekly he still on the folic acid 1 mg daily.  He has been experiencing increased joint pain and swelling in his hands shoulders and knees more severe on the right side than left.  Did not recall any other illness or injury prior to symptom worsening.  We spoke by phone 2 days ago and started on prednisone taper he is on 20 mg today is day 3 so far seeing a partial improvement joint pain and swelling is decreased but still has prolonged morning stiffness and still having some swelling in that right hand and shoulder.  He had recent cardiology follow-up everything is doing well and not in A-fib.   09/16/21 Peter Mcmillan is a 68 y.o. 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  Hand pain is pretty stable without any major flareup or exacerbation.  His current complaint is due to right elbow swelling.  He developed painful bursitis this was evaluated at urgent care clinic last month and again about a week ago.  Not particularly hot or inflamed but is painful and just remains swollen.  He does not recall any trauma preceding the onset of the swelling.   06/13/2021 Peter Mcmillan is a 68 y.o. male here for follow up for seropositive RA on methotrexate 15 mg PO weekly after last visit trying adding turmeric and continuing off the [place]. He missed one does of methotrexate due to running out of his medication and now has increased symptoms with bilateral hand pain and swelling. Right hand is worst  affected and difficult to use normally. He also has a new cough ongoing for the past month it is not productive. Cough is most common when talking, when sitting and eating, or after he lies down in bed to sleep. He is not yet taking any seasonal allergy medication for the year. He is not taking any cough medicines.   05/04/21 Peter Mcmillan is a 68 y.o. 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 weekly.  He stopped taking the Orencia injections for about a month ever since he was hospitalized for his A. fib.  He feels there was not much difference when he took the shots and has not noticed much difference since stopping them.  He continues having some joint pain stiffness and swelling mostly affecting his hands and knees.  He is curious about any other anti-inflammatory medicine  or supplements to try in addition to his RA medicines.   Previous HPI 09/23/20 Peter Mcmillan is a 69 y.o. male here for follow-up of seropositive rheumatoid arthritis on methotrexate 15 mg p.o. weekly and prednisone 20 mg daily.  He feels his symptoms have improved taking the medications although worsened greatly when he decrease his prednisone down to 10 mg as directed and so went back to taking 20 daily.  He has been out of the methotrexate since about 2 weeks ago.   Review of Systems  Constitutional:  Positive for fatigue.  HENT:  Negative for mouth sores and mouth dryness.   Eyes:  Negative for dryness.  Respiratory:  Positive for shortness of breath.   Cardiovascular:  Negative for chest pain and palpitations.  Gastrointestinal:  Negative for blood in stool, constipation and diarrhea.  Endocrine: Negative for increased urination.  Genitourinary:  Negative for involuntary urination.  Musculoskeletal:  Positive for joint pain, joint pain, joint swelling, muscle weakness, morning stiffness and muscle tenderness. Negative for gait problem, myalgias and myalgias.  Skin:  Negative for  color change, rash, hair loss and sensitivity to sunlight.  Allergic/Immunologic: Negative for susceptible to infections.  Neurological:  Negative for dizziness and headaches.  Hematological:  Negative for swollen glands.  Psychiatric/Behavioral:  Negative for depressed mood and sleep disturbance. The patient is not nervous/anxious.     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   Essential hypertension 06/11/2020   Gastro-esophageal reflux disease without esophagitis 06/11/2020   Hyperlipidemia 06/11/2020   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   Prediabetes 06/11/2020   Tobacco user 06/11/2020   Vitamin D deficiency 06/11/2020   Bilateral hand pain 06/11/2020   Bilateral shoulder pain 06/11/2020   Atrial flutter (HCC) 12/09/2013   PULMONARY NODULE 08/18/2008   COUGH 08/18/2008    Past Medical History:  Diagnosis Date   Atrial flutter (HCC)    Bursitis of right elbow    Erectile dysfunction    Rheumatoid arthritis (HCC)    Tobacco abuse    Smokeless tobacco    Family History  Problem Relation Age of Onset   Heart attack Father    Heart attack Mother    Dementia Mother    Healthy Sister    Healthy Son    Past Surgical History:  Procedure Laterality Date   ATRIAL FIBRILLATION ABLATION N/A 12/17/2020   Procedure: ATRIAL FIBRILLATION ABLATION;  Surgeon: Lanier Prude, MD;  Location: [facility] INVASIVE CV LAB;  Service: Cardiovascular;  Laterality:  N/A;   BACK SURGERY  1999   CARDIOVERSION N/A 10/06/2020   Procedure: CARDIOVERSION;  Surgeon: Little Ishikawa, MD;  Location: [facility] ENDOSCOPY;  Service: Cardiovascular;  Laterality: N/A;   COLONOSCOPY     MELANOMA EXCISION  2020   TEE WITHOUT CARDIOVERSION N/A 10/06/2020   Procedure: TRANSESOPHAGEAL ECHOCARDIOGRAM (TEE);  Surgeon: Little Ishikawa, MD;  Location: [facility] ENDOSCOPY;  Service: Cardiovascular;  Laterality: N/A;   TEE WITHOUT CARDIOVERSION N/A 12/17/2020   Procedure: TRANSESOPHAGEAL ECHOCARDIOGRAM (TEE);  Surgeon: Sande Rives, MD;  Location: [facility] INVASIVE CV LAB;  Service: Cardiovascular;  Laterality: N/A;   Social History   Social History Narrative   Not on file   Immunization History  Administered Date(s) Administered   Hep A / Hep B 02/17/2011   Influenza Split 01/29/2017   Moderna Sars-Covid-2 Vaccination 07/09/2019, 07/09/2019, 08/06/2019, 08/06/2019   Pneumococcal Polysaccharide-23 02/12/2009   Tdap 02/12/2009, 06/13/2017, 06/18/2020     Objective: Vital Signs: BP 124/80 (BP Location: Left Arm, Patient Position: Sitting, Cuff Size: Large)   Pulse 85   Resp 14   Ht 6\' 5"  (1.956 m)   Wt 224 lb (101.6 kg)   BMI 26.56 kg/m    Physical Exam Cardiovascular:     Rate and Rhythm: Normal rate and regular rhythm.  Pulmonary:     Effort: Pulmonary effort is normal.     Breath sounds: Normal breath sounds.  Skin:    General: Skin is warm and dry.  Neurological:     Mental Status: He is alert.  Psychiatric:        Mood and Affect: Mood normal.      Musculoskeletal Exam:  Shoulders full ROM no tenderness or swelling Elbows full ROM no tenderness or swelling Bilateral wrist tenderness no palpable effusion Right MCP joints chronic thickening, right hand incompletely reducible lateral deviation, left hand deviated but fully reducible, ROM limited at about 70%, no focal tenderness Knees full ROM no tenderness or swelling Very large 1st MTP  nodules, thick plantar callus    Investigation: No additional findings.  Imaging: No results found.   Recent Labs: Lab Results  Component Value Date   WBC 6.8 04/23/2023   HGB 15.4 04/23/2023   PLT 334 04/23/2023   NA 138 04/23/2023   K 4.7 04/23/2023   CL 102 04/23/2023   CO2 26 04/23/2023   GLUCOSE 102 (H) 04/23/2023   BUN 12 04/23/2023   CREATININE 0.96 04/23/2023   BILITOT 0.5 04/23/2023   ALKPHOS 42 03/14/2023   AST 18 04/23/2023   ALT 20 04/23/2023   PROT 7.2 04/23/2023   ALBUMIN 3.0 (L) 03/14/2023   CALCIUM 9.4 04/23/2023   GFRAA 111 09/23/2020   QFTBGOLDPLUS NEGATIVE 08/21/2022    Speciality Comments: No specialty comments available.  Procedures:  No procedures performed Allergies: Codeine, Other, Penicillin g, and Penicillins   Assessment / Plan:     Visit Diagnoses: Seropositive rheumatoid arthritis (HCC) - Gabapentin 300mg  at night for pain control, with potential to increase to two or three times daily if beneficial. Still has objective signs of some inflammation involving at least hands and wrist on exam today.  I am not sure about if any foot pain is truly inflammatory he also has chronic degenerative changes and some decreased range of motion.  Overall symptoms seem to be making some progress now despite discontinuing steroids but still probably moderate activity. -Checking sed rate and CRP also for disease activity assessment -Continue Humira 40 mg subcu q. 14 days -New prescription for prednisone 5 mg tablets for as needed daily  High risk medication use Complete blood count metabolic panel reviewed from last month remained stable after starting Humira.  Tolerating injections with no apparent reaction.  No serious interval infections.  Cold and painful feet No appreciable swelling or discoloration again on exam today. -Continue gabapentin 300 mg at night   Orders: No orders of the defined types were placed in this encounter.  No orders of  the defined types were placed  in this encounter.    Follow-Up Instructions: No follow-ups on file.   Fuller Plan, MD  Note - This record has been created using [employer].  Chart creation errors have been sought, but may not always  have been located. Such creation errors do not reflect on  the standard of medical care.

## 2023-05-14 ENCOUNTER — Telehealth: Payer: Self-pay | Admitting: *Deleted

## 2023-05-14 NOTE — Telephone Encounter (Signed)
 Patient with diagnosis of afib on Eliquis  for anticoagulation.    Procedure: COLONOSCOPY/ENDOSCOPY  Date of procedure: 05/22/23   CHA2DS2-VASc Score = 2   This indicates a 2.2% annual risk of stroke. The patient's score is based upon: CHF History: 1 HTN History: 0 Diabetes History: 0 Stroke History: 0 Vascular Disease History: 0 Age Score: 1 Gender Score: 0      CrCl 101 ml/min Platelet count 334  Per office protocol, patient can hold Eliquis  for 2 days prior to procedure.    **This guidance is not considered finalized until pre-operative APP has relayed final recommendations.**

## 2023-05-14 NOTE — Telephone Encounter (Signed)
 Per preop APP add pt on tomorrow 05/15/23 3 pm provider slot. Med rec and consent are done.     Patient Consent for Virtual Visit        Peter Mcmillan has provided verbal consent on 05/14/2023 for a virtual visit (video or telephone).   CONSENT FOR VIRTUAL VISIT FOR:  Peter Mcmillan  By participating in this virtual visit I agree to the following:  I hereby voluntarily request, consent and authorize [facility] and its employed or contracted physicians, physician assistants, nurse practitioners or other licensed health care professionals (the Practitioner), to provide me with telemedicine health care services (the "Services") as deemed necessary by the treating Practitioner. I acknowledge and consent to receive the Services by the Practitioner via telemedicine. I understand that the telemedicine visit will involve communicating with the Practitioner through live audiovisual communication technology and the disclosure of certain medical information by electronic transmission. I acknowledge that I have been given the opportunity to request an in-person assessment or other available alternative prior to the telemedicine visit and am voluntarily participating in the telemedicine visit.  I understand that I have the right to withhold or withdraw my consent to the use of telemedicine in the course of my care at any time, without affecting my right to future care or treatment, and that the Practitioner or I may terminate the telemedicine visit at any time. I understand that I have the right to inspect all information obtained and/or recorded in the course of the telemedicine visit and may receive copies of available information for a reasonable fee.  I understand that some of the potential risks of receiving the Services via telemedicine include:  Delay or interruption in medical evaluation due to technological equipment failure or disruption; Information transmitted may not be  sufficient (e.g. poor resolution of images) to allow for appropriate medical decision making by the Practitioner; and/or  In rare instances, security protocols could fail, causing a breach of personal health information.  Furthermore, I acknowledge that it is my responsibility to provide information about my medical history, conditions and care that is complete and accurate to the best of my ability. I acknowledge that Practitioner's advice, recommendations, and/or decision may be based on factors not within their control, such as incomplete or inaccurate data provided by me or distortions of diagnostic images or specimens that may result from electronic transmissions. I understand that the practice of medicine is not an exact science and that Practitioner makes no warranties or guarantees regarding treatment outcomes. I acknowledge that a copy of this consent can be made available to me via my patient portal [facility] MyChart), or I can request a printed copy by calling the office of [facility].    I understand that my insurance will be billed for this visit.   I have read or had this consent read to me. I understand the contents of this consent, which adequately explains the benefits and risks of the Services being provided via telemedicine.  I have been provided ample opportunity to ask questions regarding this consent and the Services and have had my questions answered to my satisfaction. I give my informed consent for the services to be provided through the use of telemedicine in my medical care

## 2023-05-14 NOTE — Telephone Encounter (Signed)
 Primary Cardiologist:Heather Carole Churches, MD (Inactive)   Preoperative team, please contact this patient and set up a phone call appointment for further preoperative risk assessment. Please obtain consent and complete medication review. Thank you for your help.   Per office protocol, patient can hold Eliquis  for 2 days prior to procedure.   I also confirmed the patient resides in the state of [place] . As per [facility] Medical Board telemedicine laws, the patient must reside in the state in which the provider is licensed.   Gerldine Koch, NP-C  05/14/2023, 12:27 PM [address], [address] Office [phone] Fax [phone]

## 2023-05-14 NOTE — Telephone Encounter (Signed)
 Per preop APP add pt on tomorrow 05/15/23 3 pm provider slot. Med rec and consent are done.

## 2023-05-15 ENCOUNTER — Encounter: Payer: Self-pay | Admitting: Nurse Practitioner

## 2023-05-15 ENCOUNTER — Ambulatory Visit: Payer: PPO | Attending: Nurse Practitioner | Admitting: Nurse Practitioner

## 2023-05-15 DIAGNOSIS — Z01810 Encounter for preprocedural cardiovascular examination: Secondary | ICD-10-CM | POA: Diagnosis not present

## 2023-05-15 NOTE — Progress Notes (Signed)
Virtual Visit via Telephone Note   Because of Peter Mcmillan's co-morbid illnesses, he is at least at moderate risk for complications without adequate follow up.  This format is felt to be most appropriate for this patient at this time.  The patient did not have access to video technology/had technical difficulties with video requiring transitioning to audio format only (telephone).  All issues noted in this document were discussed and addressed.  No physical exam could be performed with this format.  Please refer to the patient's chart for his consent to telehealth for [facility].  Evaluation Performed:  Preoperative cardiovascular risk assessment _____________   Date:  05/15/2023   Patient ID:  Peter Mcmillan, Peter Mcmillan May 27, 1955, MRN [id number] Patient Location:  Home Provider location:   Office  Primary Care Provider:  Wilfrid Lund, PA Primary Cardiologist:  Peter Sprague, Mcmillan (Inactive)  Chief Complaint / Patient Profile   68 y.o. y/o male with a h/o atrial fibrillation/flutter on chronic anticoagulation s/p ablation with return of AF RVR, RA, depressed LV function in the setting of a fib who is pending colonoscopy and endoscopy with Peter Mcmillan on 05/22/23 and presents today for telephonic preoperative cardiovascular risk assessment.  History of Present Illness    Peter Mcmillan is a 68 y.o. male who presents via audio/video conferencing for a telehealth visit today.  Pt was last seen in cardiology clinic on 02/27/23 by Dr. Tenny Mcmillan.  At that time Peter Mcmillan was doing well. Cardiac monitor was ordered but no evidence that it was completed. The patient is now pending procedure as outlined above. Since his last visit, he denies chest pain, shortness of breath, lower extremity edema, fatigue, palpitations, diaphoresis, weakness, presyncope, syncope, orthopnea, and PND. He has been having melena which prompted scheduling of this procedure.  He walks between 10000 to 15000 steps daily as well as house and yard work and achieves > 4 METS activity without concerning cardiac symptoms.   Past Medical History    Past Medical History:  Diagnosis Date   Atrial flutter (HCC)    Bursitis of right elbow    Erectile dysfunction    Rheumatoid arthritis (HCC)    Tobacco abuse    Smokeless tobacco   Past Surgical History:  Procedure Laterality Date   ATRIAL FIBRILLATION ABLATION N/A 12/17/2020   Procedure: ATRIAL FIBRILLATION ABLATION;  Surgeon: Peter Prude, Mcmillan;  Location: [facility] INVASIVE CV LAB;  Service: Cardiovascular;  Laterality: N/A;   BACK SURGERY  1999   CARDIOVERSION N/A 10/06/2020   Procedure: CARDIOVERSION;  Surgeon: Peter Ishikawa, Mcmillan;  Location: [facility] ENDOSCOPY;  Service: Cardiovascular;  Laterality: N/A;   MELANOMA EXCISION  2020   TEE WITHOUT CARDIOVERSION N/A 10/06/2020   Procedure: TRANSESOPHAGEAL ECHOCARDIOGRAM (TEE);  Surgeon: Peter Ishikawa, Mcmillan;  Location: [facility] ENDOSCOPY;  Service: Cardiovascular;  Laterality: N/A;   TEE WITHOUT CARDIOVERSION N/A 12/17/2020   Procedure: TRANSESOPHAGEAL ECHOCARDIOGRAM (TEE);  Surgeon: Peter Rives, Mcmillan;  Location: [facility] INVASIVE CV LAB;  Service: Cardiovascular;  Laterality: N/A;    Allergies  Allergies  Allergen Reactions   Codeine Other (See Comments)    agitation   Other Hives, Swelling and Other (See Comments)    make feel weird; hives/swelling   Penicillin G Other (See Comments)   Penicillins Itching, Swelling and Rash    Reaction: 5 years ago    Home Medications    Prior to Admission medications   Medication Sig Start Date End Date Taking?  Authorizing Provider  adalimumab (HUMIRA, 2 PEN,) 40 MG/0.4ML pen Inject 0.4 mLs (40 mg total) into the skin every 14 (fourteen) days. 1 kit - 2 pens 02/27/23   Peter Mcmillan  apixaban (ELIQUIS) 5 MG TABS tablet TAKE ONE TABLET BY MOUTH TWICE A DAY 04/10/23   Mcmillan, Peter R, PA  atorvastatin (LIPITOR) 20  MG tablet Take 1 tablet (20 mg total) by mouth daily. 03/16/23   Mcmillan, Dimitry, Mcmillan  benzonatate (TESSALON) 200 MG capsule Take by mouth. AS DIRECTED 05/11/23   Provider, Historical, Mcmillan  Cholecalciferol 100 MCG (4000 UT) CAPS Take 1 capsule (4,000 Units total) by mouth daily in the afternoon. Patient not taking: Reported on 03/14/2023 06/12/22   Pricilla Riffle, Mcmillan  dofetilide [facility]) 250 MCG capsule TAKE ONE CAPSULE BY MOUTH TWICE DAILY 04/10/23   Mcmillan, Peter Mills R, PA  ENTRESTO 24-26 MG TAKE ONE TABLET BY MOUTH TWICE (2) DAILY 07/05/22   Peter Prude, Mcmillan  folic acid (FOLVITE) 1 MG tablet Take 1 tablet (1 mg total) by mouth daily. 07/24/22   Peter Mcmillan  gabapentin (NEURONTIN) 300 MG capsule Take 1 capsule (300 mg total) by mouth at bedtime. 04/23/23   Peter Plan, Mcmillan  loratadine (CLARITIN) 10 MG tablet Take 10 mg by mouth daily as needed for allergies. 11/02/22   Provider, Historical, Mcmillan  Magnesium 200 MG TABS TAKE ONE TABLET BY MOUTH ONCE A DAY Patient not taking: Reported on 04/23/2023 07/19/22   Mcmillan, Peter R, PA  metoprolol succinate (TOPROL-XL) 50 MG 24 hr tablet TAKE ONE TABLET (50 MG TOTAL) BY MOUTH TWO TIMES DAILY. APPT NEEDED FOR MORE REFILLS 05/07/23   Mcmillan, Peter R, PA  omeprazole (PRILOSEC) 40 MG capsule Take 40 mg by mouth daily. 08/09/22   Provider, Historical, Mcmillan  predniSONE (DELTASONE) 10 MG tablet Take 1 tablet (10 mg total) by mouth daily with breakfast. Patient not taking: Reported on 05/14/2023 03/25/23   Peter Savoy, Mcmillan  VITAMIN D PO Take 2 tablets by mouth in the morning. Patient not taking: Reported on 04/23/2023    Provider, Historical, Mcmillan    Physical Exam    Vital Signs:  Peter Mcmillan does not have vital signs available for review today.  Given telephonic nature of communication, physical exam is limited. AAOx3. NAD. Normal affect.  Speech and respirations are unlabored.  Accessory Clinical Findings    None  Assessment & Mcmillan     1.  Preoperative Cardiovascular Risk Assessment: According to the Revised Cardiac Risk Index (RCRI), his Perioperative Risk of Major Cardiac Event is (%): 0.9. His Functional Capacity in METs is: 8.97 according to the Duke Activity Status Index (DASI). The patient is doing well from a cardiac perspective. Therefore, based on ACC/AHA guidelines, the patient would be at acceptable risk for the planned procedure without further cardiovascular testing.   The patient was advised that if he develops new symptoms prior to surgery to contact our office to arrange for a follow-up visit, and he verbalized understanding.  Per office protocol, patient can hold Eliquis for 2 days prior to procedure.   A copy of this note will be routed to requesting surgeon.  Time:   Today, I have spent 10 minutes with the patient with telehealth technology discussing medical history, symptoms, and management Mcmillan.     Levi Aland, NP-C  05/15/2023, 3:04 PM [address], [address] Office [phone] Fax [phone]

## 2023-05-16 ENCOUNTER — Other Ambulatory Visit: Payer: Self-pay | Admitting: *Deleted

## 2023-05-16 DIAGNOSIS — Z79899 Other long term (current) drug therapy: Secondary | ICD-10-CM

## 2023-05-16 DIAGNOSIS — M059 Rheumatoid arthritis with rheumatoid factor, unspecified: Secondary | ICD-10-CM

## 2023-05-16 MED ORDER — HUMIRA (2 PEN) 40 MG/0.4ML ~~LOC~~ AJKT: 40.0000 mg | AUTO-INJECTOR | SUBCUTANEOUS | 0 refills | Status: DC

## 2023-05-16 NOTE — Telephone Encounter (Signed)
Patient contacted the office and requested a refill on Humira to be sent to [facility].   Last Fill: 02/27/2023  Labs: 04/23/2023 Sedimentation rate is increasing up to 55 and CRP remains high at 56.2.     TB Gold: 08/21/2022 Neg    Next Visit: 05/25/2023  Last Visit: 04/23/2023  [id number] rheumatoid arthritis   Current Dose per office note 04/23/2023: dose not discussed  Okay to refill Humira?

## 2023-05-16 NOTE — Addendum Note (Signed)
Addended by: Henriette Combs on: 05/16/2023 01:42 PM   Modules accepted: Orders

## 2023-05-24 ENCOUNTER — Telehealth: Payer: Self-pay | Admitting: Pharmacist

## 2023-05-24 NOTE — Telephone Encounter (Signed)
Received call from [employer] stating they've been trying to get  Chesley Mires, PharmD, MPH, BCPS, CPP Clinical Pharmacist (Rheumatology and Pulmonology)

## 2023-05-25 ENCOUNTER — Encounter: Payer: Self-pay | Admitting: Internal Medicine

## 2023-05-25 ENCOUNTER — Ambulatory Visit: Payer: PPO | Attending: Internal Medicine | Admitting: Internal Medicine

## 2023-05-25 VITALS — BP 124/80 | HR 85 | Resp 14 | Ht 77.0 in | Wt 224.0 lb

## 2023-05-25 DIAGNOSIS — R209 Unspecified disturbances of skin sensation: Secondary | ICD-10-CM

## 2023-05-25 DIAGNOSIS — Z79899 Other long term (current) drug therapy: Secondary | ICD-10-CM | POA: Diagnosis not present

## 2023-05-25 DIAGNOSIS — M059 Rheumatoid arthritis with rheumatoid factor, unspecified: Secondary | ICD-10-CM | POA: Diagnosis not present

## 2023-05-25 MED ORDER — PREDNISONE 5 MG PO TABS: 5.0000 mg | ORAL_TABLET | Freq: Every day | ORAL | 1 refills | Status: DC | PRN

## 2023-05-25 NOTE — Telephone Encounter (Signed)
Prior Authorization request to [facility] ADVANTAGE/RX ADVANCE for HUMIRA is scanned to [facility] [employer] for submission when OV note from today is updated  Fax: [phone]

## 2023-05-26 LAB — C-REACTIVE PROTEIN: CRP: 51.7 mg/L — ABNORMAL HIGH (ref <8.0)

## 2023-05-26 LAB — SEDIMENTATION RATE: Sed Rate: 51 mm/h — ABNORMAL HIGH (ref 0–20)

## 2023-05-26 MED ORDER — GABAPENTIN 300 MG PO CAPS: 300.0000 mg | ORAL_CAPSULE | Freq: Every day | ORAL | 0 refills | Status: DC

## 2023-05-28 NOTE — Telephone Encounter (Signed)
 Submitted a Prior Authorization renewal request to [facility] ADVANTAGE/RX ADVANCE for HUMIRA via CoverMyMeds. Will update once we receive a response.  Fax: [phone]   Chesley Mires, PharmD, MPH, BCPS, CPP Clinical Pharmacist (Rheumatology and Pulmonology)

## 2023-06-01 ENCOUNTER — Other Ambulatory Visit (HOSPITAL_COMMUNITY): Payer: Self-pay

## 2023-06-01 NOTE — Telephone Encounter (Signed)
 Received notification from [facility] ADVANTAGE/RX ADVANCE regarding a prior authorization for HUMIRA. Authorization has been APPROVED from 05/28/23 to 11/25/2023. Approval letter sent to scan center.  Unable to run test claim bc [facility]. Appears that [facility] last filled on 05/21/23 with copay of $1094.53  Patient can continue to fill through [facility]: [phone]  Authorization # [id number]  Chesley Mires, PharmD, MPH, BCPS, CPP Clinical Pharmacist (Rheumatology and Pulmonology)

## 2023-06-04 ENCOUNTER — Other Ambulatory Visit: Payer: Self-pay | Admitting: Internal Medicine

## 2023-06-04 DIAGNOSIS — M059 Rheumatoid arthritis with rheumatoid factor, unspecified: Secondary | ICD-10-CM

## 2023-06-04 DIAGNOSIS — Z79899 Other long term (current) drug therapy: Secondary | ICD-10-CM

## 2023-06-04 NOTE — Telephone Encounter (Signed)
 Last Fill: 05/16/2023 (30 day supply)  Labs: 04/23/2023 Sedimentation rate is increasing up to 55 and CRP remains high at 56.2.  These both suggest very active rheumatoid arthritis.  The update x-ray of his hands shows significant progression of joint deformity especially across the knuckles on his right hand.   TB Gold: 08/21/2022 Neg    Next Visit: 08/16/2023  Last Visit: 05/25/2023  [id number] rheumatoid arthritis   Current Dose per office note 05/25/2023: Humira 40 mg subcu q. 14 days   Okay to refill Humira?

## 2023-06-04 NOTE — Progress Notes (Signed)
 Sed rate of 51 remains elevated and CRP of 51.7 remains elevated these are slightly better than his results from 1 month prior.  I think we can continue to monitor his progress on the Humira with the plan to keep him on the low-dose prednisone for now for symptoms and clinically prevent additional inflammatory damage.

## 2023-06-08 IMAGING — CT CT HEART MORP W/ CTA COR W/ SCORE W/ CA W/CM &/OR W/O CM
1 series · 2 of 4 positions shown, 3 images · non-contrast
Comparison: Comparison made with August 19, 2008.
COMPARISON: Comparison made with August 19, 2008.
COMPARISON: Comparison made with August 19, 2008.

Addendum:
EXAM:
OVER-READ INTERPRETATION  CT CHEST

The following report is an over-read performed by radiologist Dr.
over-read does not include interpretation of cardiac or coronary
anatomy or pathology. The coronary calcium score/coronary CTA
interpretation by the cardiologist is attached.
CLINICAL DATA: 64 year old with atrial flutter, chest pain
Cardiac/Coronary  CTA
TECHNIQUE: The patient was scanned on a Phillips Force scanner.

[Series 1124: — · 0.29mm/px · 2 of 4 slices shown, 3 images]
[im 2/4  vessel]
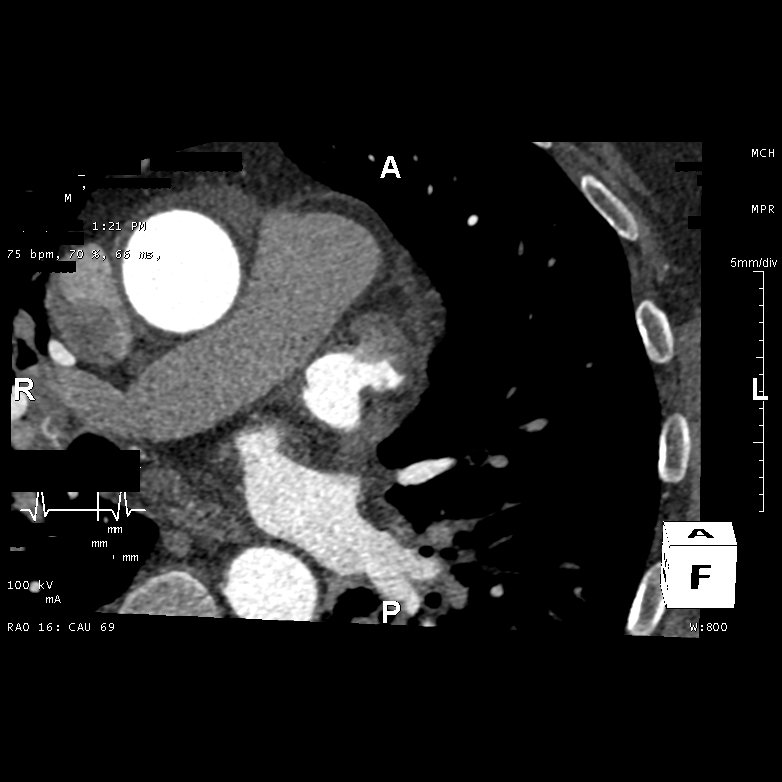
[im 2/4  lung]
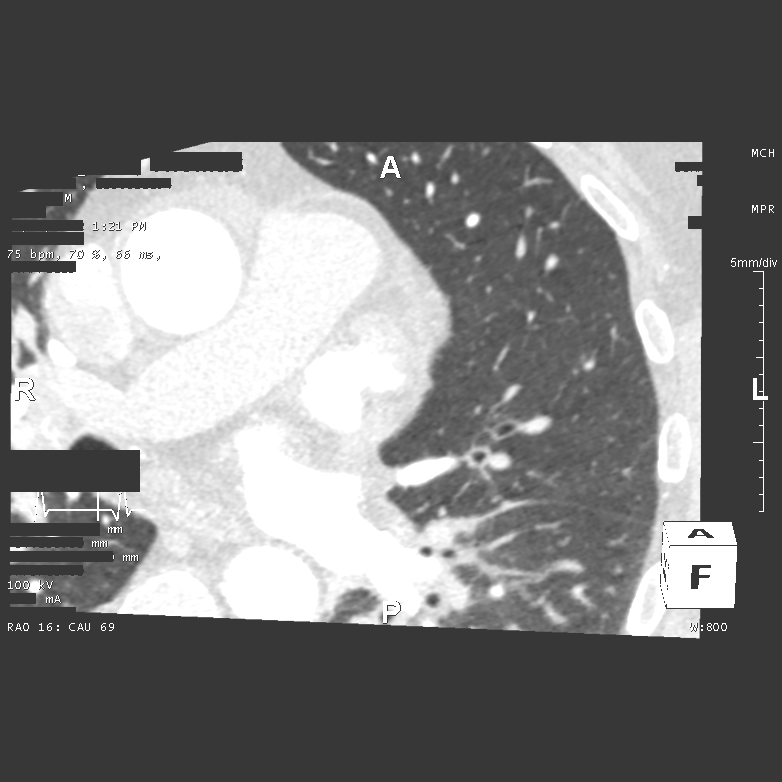
[im 3/4  vessel]
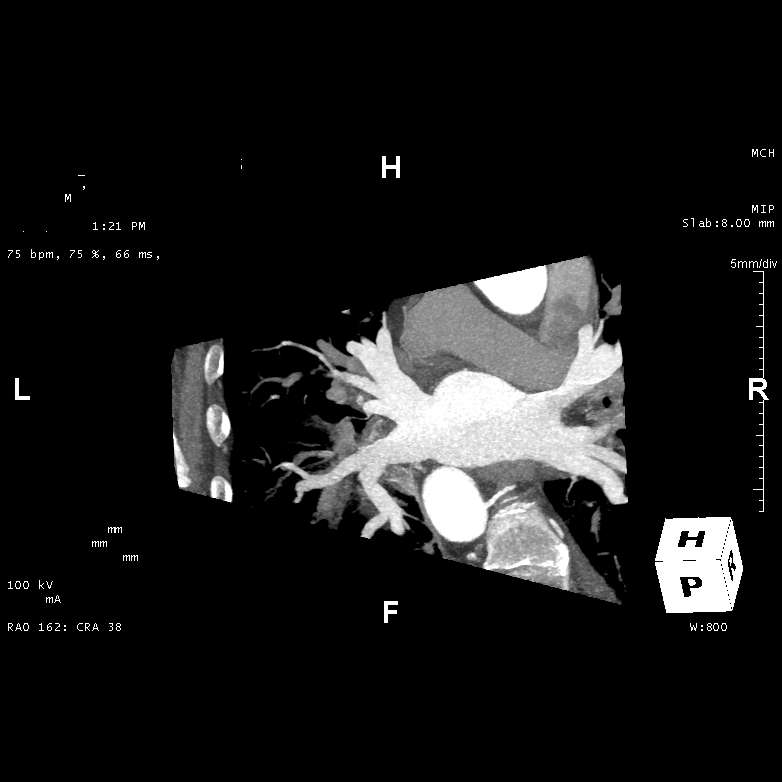

[2 of 4 positions shown; findings below may reference images not displayed]

FINDINGS: Vascular: Please see dedicated cardiac CT report and coronary artery
calcium score provided by the cardiologist.

Subtle area of low attenuation in segmental branch to the RIGHT
lower lobe favored to represent artifact but in an area subtending
airspace disease at the periphery of the RIGHT lung base that is
partially visualized.

Mediastinum/Nodes: Esophagus grossly normal. Scattered small lymph
nodes throughout the visible chest without signs of adenopathy.

Lungs/Pleura: Small effusions at the lung bases. Patchy
ground-glass. Bronchial wall thickening. Partially visualized
airspace disease at the RIGHT lung base.

Upper Abdomen: Hepatic steatosis no acute upper abdominal process.

Musculoskeletal: No acute bone finding or destructive bone lesion.
IMPRESSION: 1. Subtle area of low attenuation segmental RIGHT lower lobe
pulmonary arterial branch already very poorly opacified, potentially
artifact, difficult to exclude small embolism particularly given
appearance on image 96 of series 602. Given that this supplies an of
peripheral airspace disease CT angiography of the chest may be
helpful to exclude pulmonary embolism and resultant infarct.
2. Airspace disease at the RIGHT lung base at the periphery
partially imaged in the setting of bronchial wall thickening may
reflect pneumonitis or even atelectasis. Small pulmonary infarct is
considered as well given above findings.
3. Mild bronchial wall thickening as above raising the question of
background inflammatory changes.
4. Small pleural effusions.
5. Hepatic steatosis.

A call is out to the referring provider

To further discuss findings in the above case.

ADDENDUM:
These results were called by telephone at the time of interpretation
on 11/02/2020 at [DATE] to provider VAIBHAV METCALF , who verbally
acknowledged these results.
FINDINGS: A 100 kV prospective scan was triggered in the descending thoracic
aorta at 111 HU's. Axial non-contrast 3 mm slices were carried out
through the heart. The data set was analyzed on a dedicated work
station and scored using the Agatson method. Gantry rotation speed
was 250 msecs and collimation was .6 mm. Beta blockade and 0.8 mg of
sl NTG was given. The 3D data set was reconstructed in 5% intervals
of the 67-82 % of the R-R cycle. Diastolic phases were analyzed on a
dedicated work station using MPR, MIP and VRT modes. The patient
received 80 cc of contrast.

There is misregistration artifact (affects a small portion of
circumflex read).

Aorta:  Normal size.  No calcifications.  No dissection.

Aortic Valve: No calcifications.

Coronary Arteries:  Normal coronary origin.  Right dominance.

RCA is a large dominant artery that gives rise to PDA and PLA. There
is no plaque.

Left main is a large artery that gives rise to LAD and LCX arteries.

LAD is a large vessel that has no plaque.

LCX is a non-dominant artery that gives rise to two large OM
branches. There is no plaque. (A portion of the proximal to mid
vessel is not interpretable due to misregistration artifact).

Other findings:

Normal pulmonary vein drainage into the left atrium.

RUPV: 19.2 x 15.8 mm, area 2.2 cm2

RLPV: 23.7 x 21.0 mm, area 3.82 cm2

LUPV: 20.2 x 15.6 mm, area 2.46 cm2

LLPV: 20.4 x 16.8 mm, area 2.71 cm2

Can not exclude left atrial appendage thrombus. There are no delayed
acquisitions to confirm absence of clot.

Normal size of the pulmonary artery.

Please see radiology report for non cardiac findings.
IMPRESSION: 1. Coronary calcium score of 0. This was 0 percentile for age and
sex matched control.

2. Normal coronary origin with right dominance.

3. No evidence of CAD. (A small portion of the proximal to mid
circumflex is not interpretable due to misregistration artifact).

4. Can not exclude left atrial appendage thrombus. There are no
delayed acquisitions to confirm absence of clot. Consider repeat
dedicated pulmonary vein protocol study or LAKIESHA.

*** End of Addendum ***
Addendum:
EXAM:
OVER-READ INTERPRETATION  CT CHEST

The following report is an over-read performed by radiologist Dr.
over-read does not include interpretation of cardiac or coronary
anatomy or pathology. The coronary calcium score/coronary CTA
interpretation by the cardiologist is attached.
FINDINGS: Vascular: Please see dedicated cardiac CT report and coronary artery
calcium score provided by the cardiologist.

Subtle area of low attenuation in segmental branch to the RIGHT
lower lobe favored to represent artifact but in an area subtending
airspace disease at the periphery of the RIGHT lung base that is
partially visualized.

Mediastinum/Nodes: Esophagus grossly normal. Scattered small lymph
nodes throughout the visible chest without signs of adenopathy.

Lungs/Pleura: Small effusions at the lung bases. Patchy
ground-glass. Bronchial wall thickening. Partially visualized
airspace disease at the RIGHT lung base.

Upper Abdomen: Hepatic steatosis no acute upper abdominal process.

Musculoskeletal: No acute bone finding or destructive bone lesion.
IMPRESSION: 1. Subtle area of low attenuation segmental RIGHT lower lobe
pulmonary arterial branch already very poorly opacified, potentially
artifact, difficult to exclude small embolism particularly given
appearance on image 96 of series 602. Given that this supplies an of
peripheral airspace disease CT angiography of the chest may be
helpful to exclude pulmonary embolism and resultant infarct.
2. Airspace disease at the RIGHT lung base at the periphery
partially imaged in the setting of bronchial wall thickening may
reflect pneumonitis or even atelectasis. Small pulmonary infarct is
considered as well given above findings.
3. Mild bronchial wall thickening as above raising the question of
background inflammatory changes.
4. Small pleural effusions.
5. Hepatic steatosis.

A call is out to the referring provider

To further discuss findings in the above case.

ADDENDUM:
These results were called by telephone at the time of interpretation
on 11/02/2020 at [DATE] to provider VAIBHAV METCALF , who verbally
acknowledged these results.

*** End of Addendum ***
EXAM:
OVER-READ INTERPRETATION  CT CHEST

The following report is an over-read performed by radiologist Dr.
over-read does not include interpretation of cardiac or coronary
anatomy or pathology. The coronary calcium score/coronary CTA
interpretation by the cardiologist is attached.
FINDINGS: Vascular: Please see dedicated cardiac CT report and coronary artery
calcium score provided by the cardiologist.

Subtle area of low attenuation in segmental branch to the RIGHT
lower lobe favored to represent artifact but in an area subtending
airspace disease at the periphery of the RIGHT lung base that is
partially visualized.

Mediastinum/Nodes: Esophagus grossly normal. Scattered small lymph
nodes throughout the visible chest without signs of adenopathy.

Lungs/Pleura: Small effusions at the lung bases. Patchy
ground-glass. Bronchial wall thickening. Partially visualized
airspace disease at the RIGHT lung base.

Upper Abdomen: Hepatic steatosis no acute upper abdominal process.

Musculoskeletal: No acute bone finding or destructive bone lesion.
IMPRESSION: 1. Subtle area of low attenuation segmental RIGHT lower lobe
pulmonary arterial branch already very poorly opacified, potentially
artifact, difficult to exclude small embolism particularly given
appearance on image 96 of series 602. Given that this supplies an of
peripheral airspace disease CT angiography of the chest may be
helpful to exclude pulmonary embolism and resultant infarct.
2. Airspace disease at the RIGHT lung base at the periphery
partially imaged in the setting of bronchial wall thickening may
reflect pneumonitis or even atelectasis. Small pulmonary infarct is
considered as well given above findings.
3. Mild bronchial wall thickening as above raising the question of
background inflammatory changes.
4. Small pleural effusions.
5. Hepatic steatosis.

A call is out to the referring provider

To further discuss findings in the above case.

## 2023-06-08 IMAGING — CT CT ANGIO CHEST
2 of 6 series · 19 of 36 positions shown · IV contrast (omnipaque)
Comparison: 08/19/2008, 11/02/2020

CLINICAL DATA: Concern for right lower lobe pulmonary emboli,
abnormal CT performed earlier today

EXAM:
CT ANGIOGRAPHY CHEST WITH CONTRAST
TECHNIQUE: Multidetector CT imaging of the chest was performed using the
standard protocol during bolus administration of intravenous
contrast. Multiplanar CT image reconstructions and MIPs were
obtained to evaluate the vascular anatomy.
CONTRAST:  65mL OMNIPAQUE IOHEXOL 350 MG/ML SOLN

[Series 7: pe thins · axial · 0.83mm/px · z∈[+1317,+1594]mm · 18 of 440 slices shown]
[im 22/440  lung]
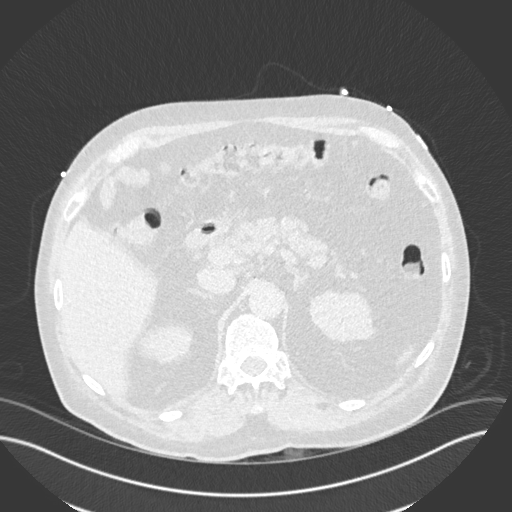
[im 44/440  mediastinal]
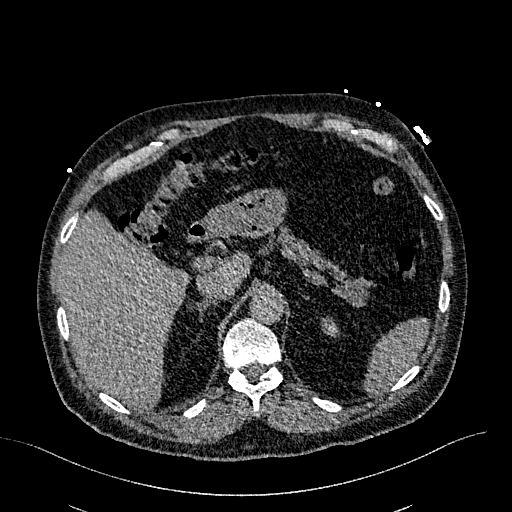
[im 66/440  lung]
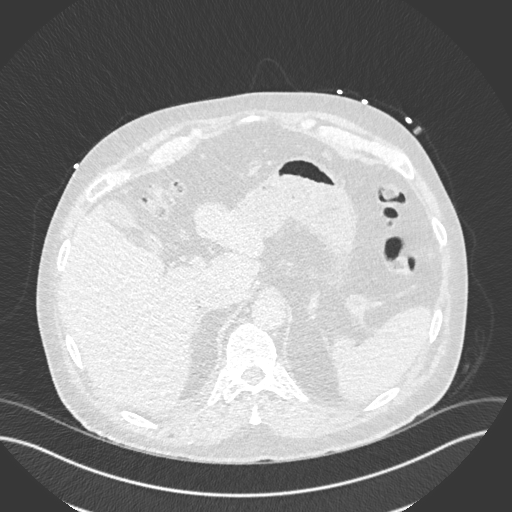
[im 88/440  mediastinal]
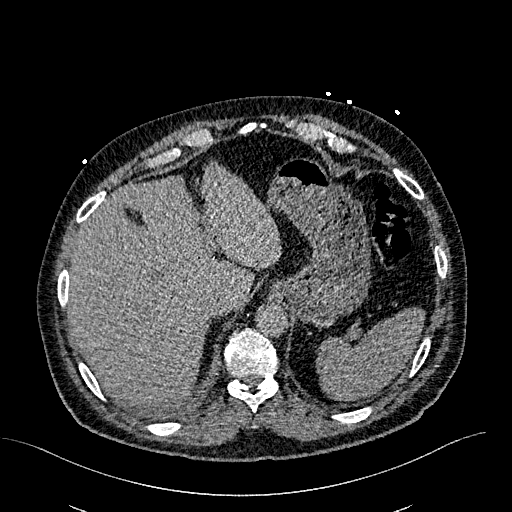
[im 110/440  lung]
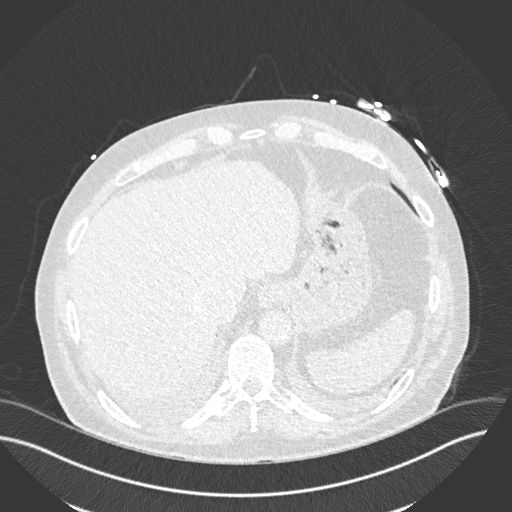
[im 132/440  mediastinal]
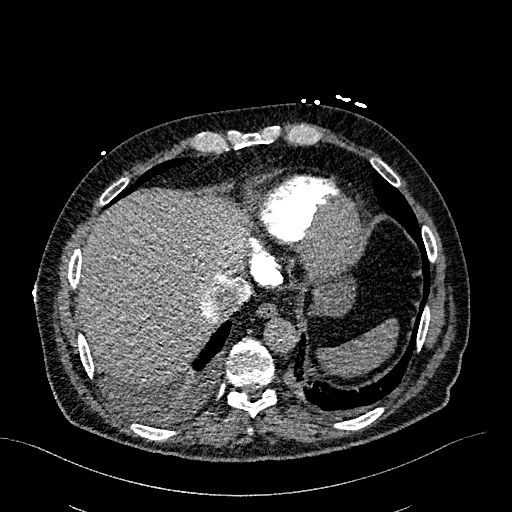
[im 154/440  lung]
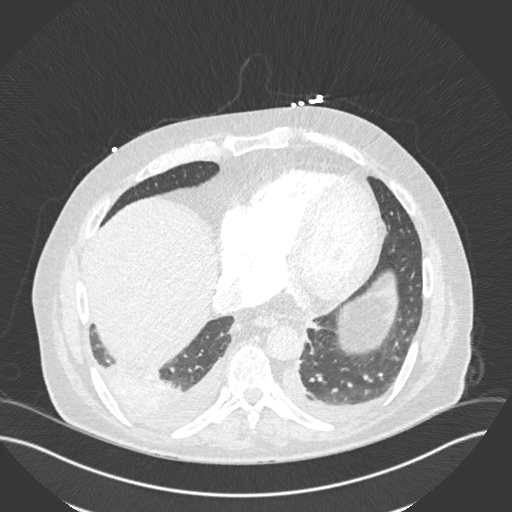
[im 176/440  mediastinal]
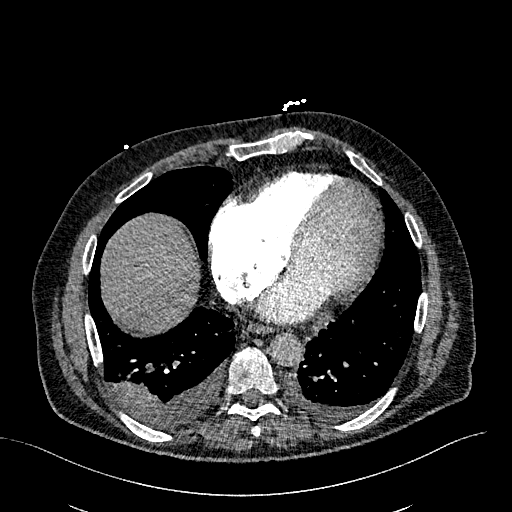
[im 198/440  lung]
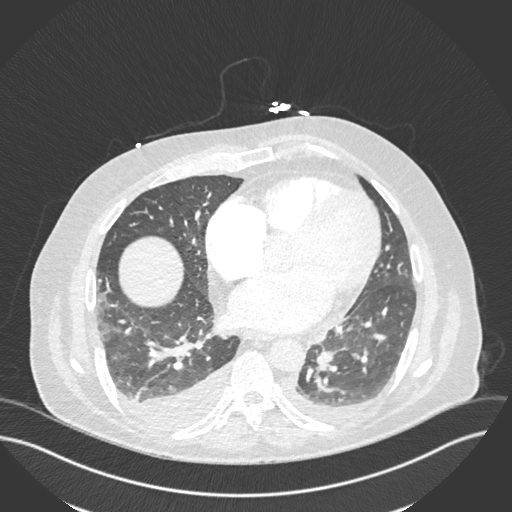
[im 242/440  mediastinal]
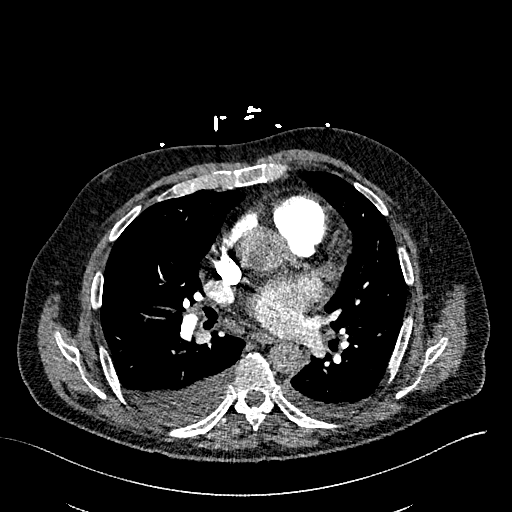
[im 264/440  lung]
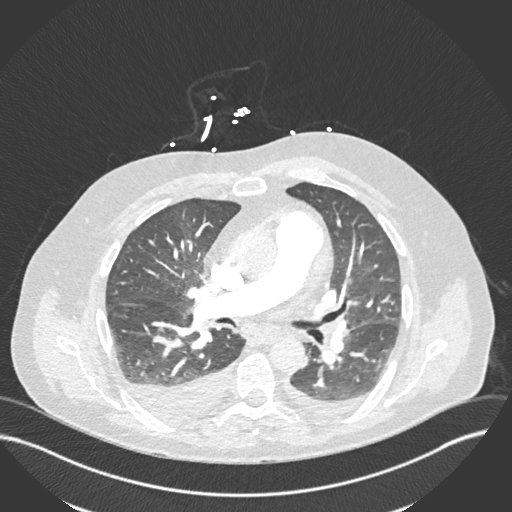
[im 286/440  mediastinal]
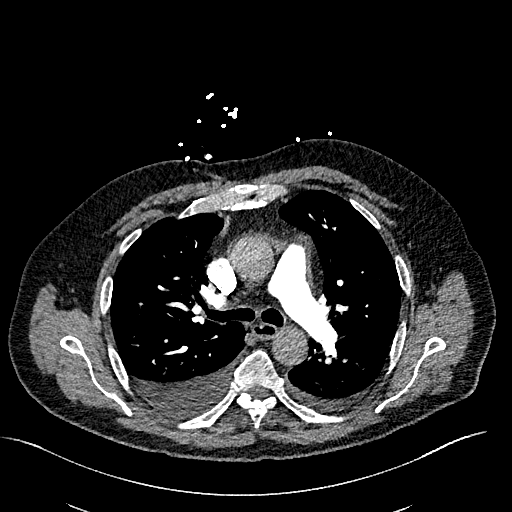
[im 308/440  lung]
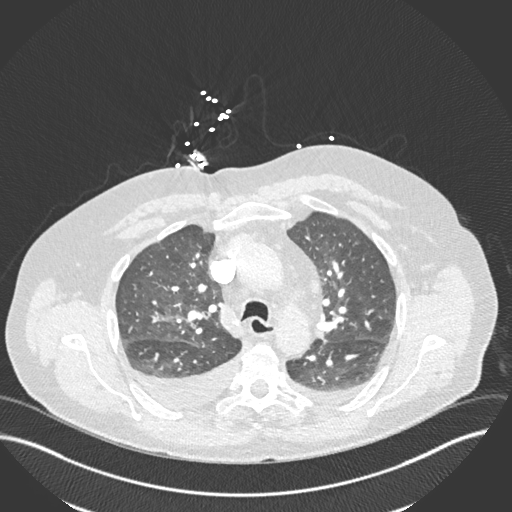
[im 330/440  mediastinal]
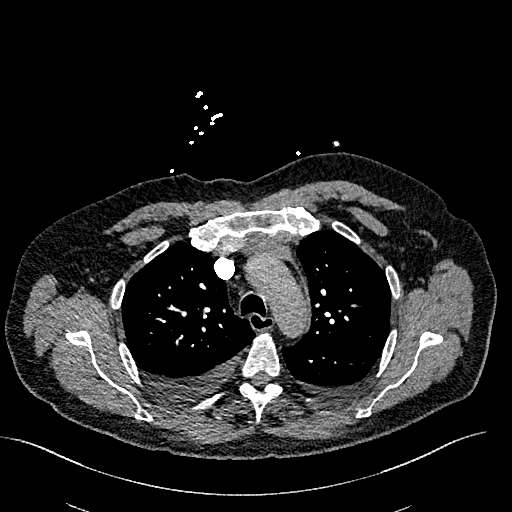
[im 352/440  lung]
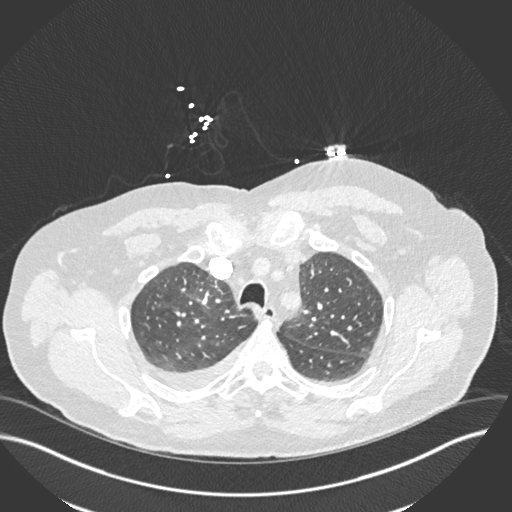
[im 374/440  mediastinal]
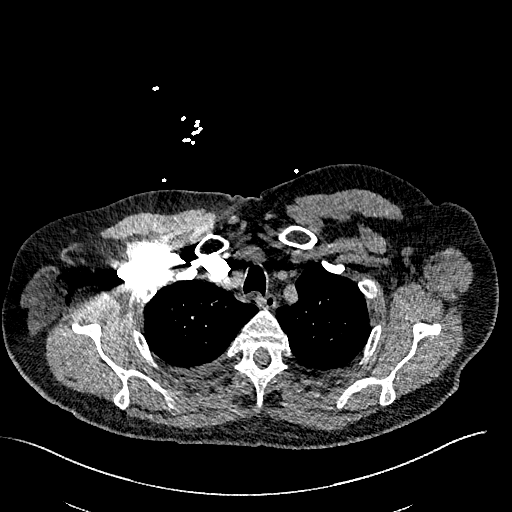
[im 396/440  lung]
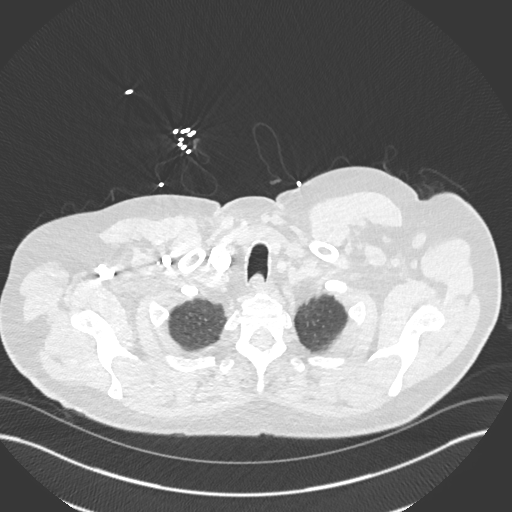
[im 418/440  mediastinal]
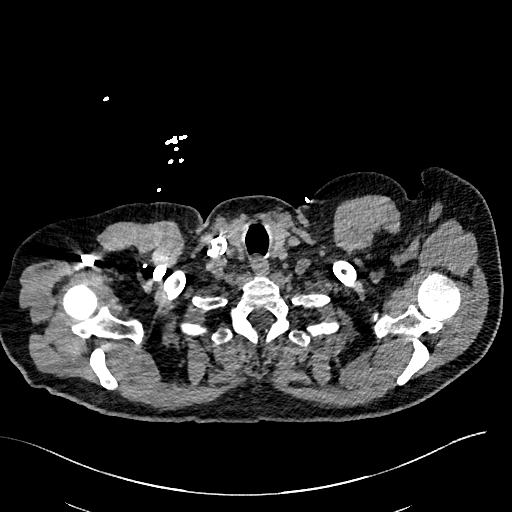

[Series 8: pe 2mm cor · coronal · 0.61mm/px · 1 of 136 slices shown]
[im 68/136  mediastinal]
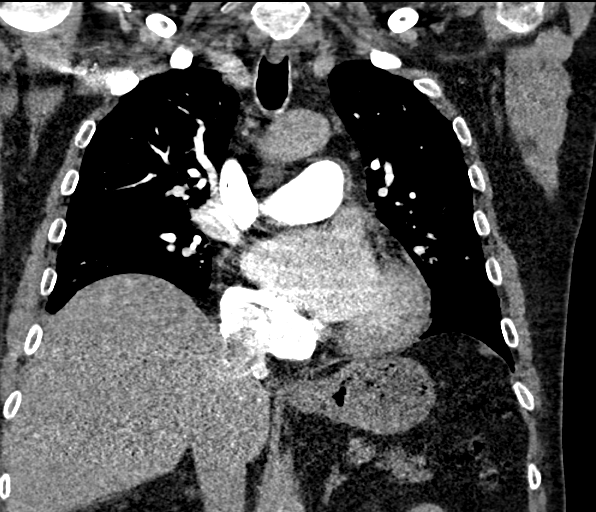

[19 of 36 positions shown; findings below may reference images not displayed]

FINDINGS: Cardiovascular: This is a technically adequate evaluation of the
pulmonary vasculature. No filling defects or pulmonary emboli.

The heart is unremarkable without pericardial effusion. Normal
caliber of the thoracic aorta.

Mediastinum/Nodes: No enlarged mediastinal, hilar, or axillary lymph
nodes. Thyroid gland, trachea, and esophagus demonstrate no
significant findings.

Lungs/Pleura: There are small bilateral pleural effusions unchanged
since earlier CT. Continued subsegmental consolidation within the
dependent right lower lobe, favor atelectasis. No pneumothorax. Mild
bilateral bronchial wall thickening greatest in the lower lobes.

Upper Abdomen: No acute abnormality.

Musculoskeletal: No acute or destructive bony lesions. Reconstructed
images demonstrate no additional findings.

Review of the MIP images confirms the above findings.
IMPRESSION: 1. No evidence of pulmonary embolus.
2. Stable small bilateral pleural effusions, with subsegmental right
lower lobe consolidation likely representing atelectasis.
3. Bilateral bronchial wall thickening greatest in the lower lobes,
which could reflect reactive airway disease or bronchitis.

## 2023-06-18 ENCOUNTER — Other Ambulatory Visit: Payer: Self-pay | Admitting: Cardiology

## 2023-06-19 NOTE — Telephone Encounter (Signed)
This is a A-Fib clinic pt. Please address 

## 2023-06-22 ENCOUNTER — Ambulatory Visit: Payer: Commercial Managed Care - PPO | Admitting: Internal Medicine

## 2023-06-25 ENCOUNTER — Ambulatory Visit: Payer: Commercial Managed Care - PPO | Admitting: Internal Medicine

## 2023-06-25 ENCOUNTER — Telehealth: Payer: Self-pay | Admitting: *Deleted

## 2023-06-25 NOTE — Telephone Encounter (Signed)
 Patient contacted the office stating he is having swelling in his knees, hands and ankles. Patient states it has gotten worse. Patient states he can only get relief by laying in the bed resting and elevating his legs. Patient states he is taking Humira and due for his next injection today. Patient is requesting a prescription for Prednisone. Please advise.

## 2023-06-27 ENCOUNTER — Other Ambulatory Visit: Payer: Self-pay | Admitting: Cardiology

## 2023-06-27 ENCOUNTER — Telehealth: Payer: Self-pay | Admitting: Cardiology

## 2023-06-27 MED ORDER — ENTRESTO 24-26 MG PO TABS: 1.0000 | ORAL_TABLET | Freq: Two times a day (BID) | ORAL | 0 refills | Status: DC

## 2023-06-27 NOTE — Telephone Encounter (Signed)
 Pt's medication was sent to pt's pharmacy as requested. Confirmation received.

## 2023-06-27 NOTE — Telephone Encounter (Signed)
 Pt is seen in the A-Fib clinic for Dr. Lalla Brothers. Please address

## 2023-06-27 NOTE — Telephone Encounter (Signed)
*  STAT* If patient is at the pharmacy, call can be transferred to refill team.   1. Which medications need to be refilled? (please list name of each medication and dose if known) ENTRESTO 24-26 MG    2. Would you like to learn more about the convenience, safety, & potential cost savings by using the [facility]?    3. Are you open to using the [facility]  4. Which pharmacy/location (including street and city if local pharmacy) is medication to be sent to? [facility] - [place], [place] - [address]    5. Do they need a 30 day or 90 day supply? 30

## 2023-07-09 ENCOUNTER — Telehealth: Payer: Self-pay | Admitting: Cardiology

## 2023-07-09 NOTE — Telephone Encounter (Signed)
 Left voicemail to return call to office

## 2023-07-09 NOTE — Telephone Encounter (Signed)
 Pt c/o swelling/edema: STAT if pt has developed SOB within 24 hours  If swelling, where is the swelling located?  Around ankles and his hands   How much weight have you gained and in what time span? NA   Have you gained 2 pounds in a day or 5 pounds in a week? na  Do you have a log of your daily weights (if so, list)? Na   Are you currently taking a fluid pill? No   Are you currently SOB? No SOB but will get tried easy. Pt denies any other symptoms   Have you traveled recently in a car or plane for an extended period of time?  No   Best number [phone]

## 2023-07-09 NOTE — Telephone Encounter (Signed)
 Spoke with patient and he states he has swelling from his knees down to his feet. Denies any chest pain. Has some  SOB. He states he is concerned because the last time he went into AFIB his legs swelled up. BP 130/71 and his HR was in the 80's. He is not sure if he gained weight but doesn't think so. Appointment scheduled with DOD. He is taking prednisone. Informed him prednisone can cause fluid retention. ED precautions discussed

## 2023-07-09 NOTE — Telephone Encounter (Signed)
 Pt returning nurse's call. Please advise

## 2023-07-10 ENCOUNTER — Ambulatory Visit: Attending: Cardiology | Admitting: Cardiology

## 2023-07-10 ENCOUNTER — Encounter: Payer: Self-pay | Admitting: Cardiology

## 2023-07-10 VITALS — BP 137/87 | HR 84 | Resp 16 | Ht 77.0 in | Wt 219.2 lb

## 2023-07-10 DIAGNOSIS — R609 Edema, unspecified: Secondary | ICD-10-CM

## 2023-07-10 DIAGNOSIS — I77819 Aortic ectasia, unspecified site: Secondary | ICD-10-CM

## 2023-07-10 DIAGNOSIS — E782 Mixed hyperlipidemia: Secondary | ICD-10-CM | POA: Diagnosis not present

## 2023-07-10 DIAGNOSIS — I5032 Chronic diastolic (congestive) heart failure: Secondary | ICD-10-CM | POA: Diagnosis not present

## 2023-07-10 DIAGNOSIS — Z79899 Other long term (current) drug therapy: Secondary | ICD-10-CM

## 2023-07-10 DIAGNOSIS — I480 Paroxysmal atrial fibrillation: Secondary | ICD-10-CM | POA: Diagnosis not present

## 2023-07-10 MED ORDER — POTASSIUM CHLORIDE CRYS ER 20 MEQ PO TBCR: 20.0000 meq | EXTENDED_RELEASE_TABLET | Freq: Every day | ORAL | 3 refills | Status: DC

## 2023-07-10 MED ORDER — FUROSEMIDE 20 MG PO TABS: 20.0000 mg | ORAL_TABLET | Freq: Every day | ORAL | 3 refills | Status: DC

## 2023-07-10 NOTE — Patient Instructions (Addendum)
 Medication Instructions:  Start Lasix 20 mg once daily Start Postassium (Kdur) 20 meq once daily Restart Magnesium 200 mg once daily *If you need a refill on your cardiac medications before your next appointment, please call your pharmacy*  Lab Work: BMET, [employer], Mag on Friday 4/11 If you have labs (blood work) drawn today and your tests are completely normal, you will receive your results only by: MyChart Message (if you have MyChart) OR A paper copy in the mail If you have any lab test that is abnormal or we need to change your treatment, we will call you to review the results.  Testing/Procedures:  Your physician has requested that you have an echocardiogram. Echocardiography is a painless test that uses sound waves to create images of your heart. It provides your doctor with information about the size and shape of your heart and how well your heart's chambers and valves are working. This procedure takes approximately one hour. There are no restrictions for this procedure. Please do NOT wear cologne, perfume, aftershave, or lotions (deodorant is allowed). Please arrive 15 minutes prior to your appointment time.  Please note: We ask at that you not bring children with you during ultrasound (echo/ vascular) testing. Due to room size and safety concerns, children are not allowed in the ultrasound rooms during exams. Our front office staff cannot provide observation of children in our lobby area while testing is being conducted. An adult accompanying a patient to their appointment will only be allowed in the ultrasound room at the discretion of the ultrasound technician under special circumstances. We apologize for any inconvenience.   Follow-Up: At [facility], you and your health needs are our priority.  As part of our continuing mission to provide you with exceptional heart care, our providers are all part of one team.  This team includes your primary Cardiologist (physician) and  Advanced Practice Providers or APPs (Physician Assistants and Nurse Practitioners) who all work together to provide you with the care you need, when you need it.  Your next appointment:   2 weeks  Provider:   Tenny Craw  We recommend signing up for the patient portal called "MyChart".  Sign up information is provided on this After Visit Summary.  MyChart is used to connect with patients for Virtual Visits (Telemedicine).  Patients are able to view lab/test results, encounter notes, upcoming appointments, etc.  Non-urgent messages can be sent to your provider as well.   To learn more about what you can do with MyChart, go to [email or web address].   Other Instructions       1st Floor: - Lobby - Registration  - Pharmacy  - Lab - Cafe  2nd Floor: - PV Lab - Diagnostic Testing (echo, CT, nuclear med)  3rd Floor: - Vacant  4th Floor: - TCTS (cardiothoracic surgery) - AFib Clinic - Structural Heart Clinic - Vascular Surgery  - Vascular Ultrasound  5th Floor: - [facility] Cardiology (general and EP) - Clinical Pharmacy for coumadin, hypertension, lipid, weight-loss medications, and med management appointments    Valet parking services will be available as well.

## 2023-07-10 NOTE — Progress Notes (Signed)
 Cardiology Office Note   Date:  07/10/2023   ID:  Peter Mcmillan 1955/04/27, MRN [id number]  PCP:  Wilfrid Lund, PA  Cardiologist:   Dietrich Pates, MD  Patient presents as a workin for LE edema.    History of Present Illness: Peter Mcmillan is a 68 y.o. male with a history of atrial flutter, atrial fibrillation, systolic dysfunction, RA .   Patient presented to the hospital 10/05/20 with afib RVR and was started on Eliquis for a CHADS2VASC score of 1. He underwent TEE guided DCCV on 10/06/20. He was hospitalized again 11/01/20 for progressive dyspnea and lower extremity edema. He was cardioverted in the ED then admitted for observation and diuresis. Patient presented to the ER  8/14 with atrial flutter with RVR. He was successfully cardioverted.    Patient underwent afib and flutter ablation by Jeanie Cooks on 12/17/20. Unfortunately, he had recurrence of rapid afib and had another DCCV in the ED on 12/28/20. His metoprolol was also increased.    He has been seen in afib clinic since, last time in April 2024   He was in SR at that time    Patient says he has taken prednisone for RA  Since starting this  he has noticed ankle swelling.  Seen by Eligha Bridegroom, NP  05/15/2023 for preop clearance for colonoscopy and was doing well.   He called in stating that he was having increase LE edema and is now being see today as a workin. He called in yesterday due to the LE edema and says it got better yesterday evening after sitting all day.  He does not salt his food and usually avoids eating out. [place] the beach for the past 2 days. He does admit to eating processed foods including luncheon meat, sausage and bacon. He denies any Chest pain or pressure.  He has chronic SOB that he thinks is stable. He has a chronic cough as well.    Current Meds  Medication Sig   apixaban (ELIQUIS) 5 MG TABS tablet TAKE ONE TABLET BY MOUTH TWICE A DAY   Cholecalciferol 100 MCG (4000 UT) CAPS Take 1  capsule (4,000 Units total) by mouth daily in the afternoon.   dofetilide (TIKOSYN) 250 MCG capsule TAKE ONE CAPSULE BY MOUTH TWICE DAILY   folic acid (FOLVITE) 1 MG tablet Take 1 tablet (1 mg total) by mouth daily.   loratadine (CLARITIN) 10 MG tablet Take 10 mg by mouth daily as needed for allergies.   metoprolol succinate (TOPROL-XL) 50 MG 24 hr tablet TAKE ONE TABLET (50 MG TOTAL) BY MOUTH TWO TIMES DAILY. APPT NEEDED FOR MORE REFILLS   omeprazole (PRILOSEC) 40 MG capsule Take 40 mg by mouth daily.   predniSONE (DELTASONE) 5 MG tablet Take 1 tablet (5 mg total) by mouth daily as needed.   sacubitril-valsartan (ENTRESTO) 24-26 MG Take 1 tablet by mouth 2 (two) times daily.   VITAMIN D PO Take 2 tablets by mouth in the morning.   [DISCONTINUED] atorvastatin (LIPITOR) 20 MG tablet Take 1 tablet (20 mg total) by mouth daily.   [DISCONTINUED] benzonatate (TESSALON) 200 MG capsule Take by mouth. AS DIRECTED     Allergies:   Codeine, Other, Penicillin g, and Penicillins   Past Medical History:  Diagnosis Date   Atrial flutter (HCC)    Bursitis of right elbow    Erectile dysfunction    Rheumatoid arthritis (HCC)    Tobacco abuse    Smokeless tobacco  Past Surgical History:  Procedure Laterality Date   ATRIAL FIBRILLATION ABLATION N/A 12/17/2020   Procedure: ATRIAL FIBRILLATION ABLATION;  Surgeon: Lanier Prude, MD;  Location: [facility] INVASIVE CV LAB;  Service: Cardiovascular;  Laterality: N/A;   BACK SURGERY  1999   CARDIOVERSION N/A 10/06/2020   Procedure: CARDIOVERSION;  Surgeon: Little Ishikawa, MD;  Location: [facility] ENDOSCOPY;  Service: Cardiovascular;  Laterality: N/A;   COLONOSCOPY     MELANOMA EXCISION  2020   TEE WITHOUT CARDIOVERSION N/A 10/06/2020   Procedure: TRANSESOPHAGEAL ECHOCARDIOGRAM (TEE);  Surgeon: Little Ishikawa, MD;  Location: [facility] ENDOSCOPY;  Service: Cardiovascular;  Laterality: N/A;   TEE WITHOUT CARDIOVERSION N/A 12/17/2020   Procedure:  TRANSESOPHAGEAL ECHOCARDIOGRAM (TEE);  Surgeon: Sande Rives, MD;  Location: [facility] INVASIVE CV LAB;  Service: Cardiovascular;  Laterality: N/A;     Social History:  The patient  reports that he has never smoked. He has never been exposed to tobacco smoke. His smokeless tobacco use includes snuff. He reports current alcohol use. He reports that he does not use drugs.   Family History:  The patient's family history includes Dementia in his mother; Healthy in his sister and son; Heart attack in his father and mother.    ROS:  Please see the history of present illness. All other systems are reviewed and  Negative to the above problem except as noted.    PHYSICAL EXAM: VS:  BP 137/87 (BP Location: Left Arm, Patient Position: Sitting, Cuff Size: Normal)   Pulse 84   Resp 16   Ht 6\' 5"  (1.956 m)   Wt 219 lb 3.2 oz (99.4 kg)   SpO2 99%   BMI 25.99 kg/m   GEN: Well nourished, well developed in no acute distress HEENT: Normal NECK: No JVD; No carotid bruits LYMPHATICS: No lymphadenopathy CARDIAC:RRR, no murmurs, rubs, gallops RESPIRATORY:  Clear to auscultation without rales, wheezing or rhonchi  ABDOMEN: Soft, non-tender, non-distended MUSCULOSKELETAL:  2+ ankle edema; No deformity  SKIN: Warm and dry NEUROLOGIC:  Alert and oriented x 3 PSYCHIATRIC:  Normal affect    Lipid Panel    Component Value Date/Time   CHOL 125 03/15/2023 0442   TRIG 42 03/15/2023 0442   HDL 36 (L) 03/15/2023 0442   CHOLHDL 3.5 03/15/2023 0442   VLDL 8 03/15/2023 0442   LDLCALC 81 03/15/2023 0442      Wt Readings from Last 3 Encounters:  07/10/23 219 lb 3.2 oz (99.4 kg)  05/25/23 224 lb (101.6 kg)  04/23/23 223 lb (101.2 kg)      ASSESSMENT AND PLAN:  #Atrial fibrillation.    -s/p afib ablation with recurrent afib  -heart monitor was done but I cannot find the results of this -he is in NSR on EKG today -continue prescription drug management with Apixaban 5mg  BID, Tikosyn BID and  Toprol ZL 50mg  BID with PRN refills  #Chronic diastolic CHF    -complaining of increase LE edema -he was started on prednisone in January which may be contributing -weight actually down 5lbs from 05/2023 -LE edema mainly in his ankles>>not compliant with low sodium diet >> eating processed foods>>encouraged him to cut out all processed foods -continue prescription drug management with Entresto 24-26mg  BID, Toprol XL 50mg  BID with PRN refills -start Lasix 20mg  daily along with Kdur daily -BMET, BNP and Magnesium ion Friday  Followup with Dr. Tenny Craw in 2 weeks  Current medicines are reviewed at length with the patient today.  The patient does not have  concerns regarding medicines.  Signed, Armanda Magic, MD  07/10/2023 11:14 AM    [facility] [address] [place], [place], [place]  [address] Phone: [phone]; Fax: [phone]

## 2023-07-13 DIAGNOSIS — Z79899 Other long term (current) drug therapy: Secondary | ICD-10-CM | POA: Diagnosis not present

## 2023-07-14 LAB — BASIC METABOLIC PANEL WITH GFR
BUN/Creatinine Ratio: 12 (ref 10–24)
BUN: 10 mg/dL (ref 8–27)
CO2: 21 mmol/L (ref 20–29)
Calcium: 9 mg/dL (ref 8.6–10.2)
Chloride: 101 mmol/L (ref 96–106)
Creatinine, Ser: 0.83 mg/dL (ref 0.76–1.27)
Glucose: 100 mg/dL — ABNORMAL HIGH (ref 70–99)
Potassium: 4.4 mmol/L (ref 3.5–5.2)
Sodium: 137 mmol/L (ref 134–144)
eGFR: 96 mL/min/{1.73_m2} (ref >59)

## 2023-07-14 LAB — MAGNESIUM: Magnesium: 2 mg/dL (ref 1.6–2.3)

## 2023-07-14 LAB — PRO B NATRIURETIC PEPTIDE: NT-Pro BNP: 503 pg/mL — ABNORMAL HIGH (ref 0–376)

## 2023-07-17 ENCOUNTER — Other Ambulatory Visit: Payer: Self-pay | Admitting: Internal Medicine

## 2023-07-17 DIAGNOSIS — M059 Rheumatoid arthritis with rheumatoid factor, unspecified: Secondary | ICD-10-CM

## 2023-07-17 NOTE — Telephone Encounter (Signed)
 Last Fill: 05/25/2023  Next Visit: 08/16/2023  Last Visit: 05/25/2023  Dx: Seropositive rheumatoid arthritis (HCC)   Current Dose per office note on 05/25/2023: prednisone 5 mg tablets for as needed daily   Okay to refill Prednisone?

## 2023-07-23 NOTE — Progress Notes (Signed)
 Cardiology Office Note   Date:  07/24/2023   ID:  Peter, Mcmillan Mar 29, 1956, MRN [id number]  PCP:  Sinda Duel, PA  Cardiologist:   Ola Berger, MD   Patient presents for follow up of atrial fibrillation     History of Present Illness: Peter Mcmillan is a 68 y.o. male with a history of atrial fib/flutter, HFrEF and RA    -2022  Pt presented to the hospital with afib RVR.  Started on Eliquis  for a CHADS2VASC score of 1. He underwent TEE guided DCCV on 10/06/20.  AUg 2022 Pt hospitalized again  for progressive dyspnea and lower extremity edema. He was cardioverted in the ED then admitted for observation and diuresis.  AUg 2022, Pt presented to the ER  with atrial flutter with RVR. He was successfully cardioverted.   Sept 2022  Pt underwent afib and aflutter ablation by Rodolph Clap on Unfortunately, he had recurrence of rapid afib and had another DCCV in the ED on 12/28/20. His metoprolol  was also increased Jan 2023  Pt loaded with Tikosyn   Converted to SR   I saw the pt in Nov 2024   He was seen by Golden Late in the interval for sleep  The pt denies palpitations   Breathing is OK   No dizziness No CP    Does complain of LE edema    Worse as day goes on   Better in am       Current Meds  Medication Sig   Adalimumab  (HUMIRA , 1 PEN, [place]) Inject into the skin. Per patient taking injection every 14 days.   apixaban  (ELIQUIS ) 5 MG TABS tablet TAKE ONE TABLET BY MOUTH TWICE A DAY   Cholecalciferol  100 MCG (4000 UT) CAPS Take 1 capsule (4,000 Units total) by mouth daily in the afternoon.   dofetilide  (TIKOSYN ) 250 MCG capsule TAKE ONE CAPSULE BY MOUTH TWICE DAILY   folic acid  (FOLVITE ) 1 MG tablet Take 1 tablet (1 mg total) by mouth daily.   furosemide  (LASIX ) 20 MG tablet Take 1 tablet (20 mg total) by mouth daily.   loratadine  (CLARITIN ) 10 MG tablet Take 10 mg by mouth daily as needed for allergies.   metoprolol  succinate (TOPROL -XL) 50 MG 24 hr tablet TAKE ONE TABLET (50  MG TOTAL) BY MOUTH TWO TIMES DAILY. APPT NEEDED FOR MORE REFILLS   omeprazole (PRILOSEC) 40 MG capsule Take 40 mg by mouth daily.   potassium chloride  SA (KLOR-CON  M) 20 MEQ tablet Take 1 tablet (20 mEq total) by mouth daily.   predniSONE  (DELTASONE ) 5 MG tablet TAKE ONE TABLET (5 MG TOTAL) BY MOUTH DAILY AS NEEDED.   sacubitril -valsartan  (ENTRESTO ) 24-26 MG Take 1 tablet by mouth 2 (two) times daily.   VITAMIN D  PO Take 2 tablets by mouth in the morning.     Allergies:   Codeine, Other, Penicillin g, and Penicillins   Past Medical History:  Diagnosis Date   Atrial flutter (HCC)    Bursitis of right elbow    Erectile dysfunction    Rheumatoid arthritis (HCC)    Tobacco abuse    Smokeless tobacco    Past Surgical History:  Procedure Laterality Date   ATRIAL FIBRILLATION ABLATION N/A 12/17/2020   Procedure: ATRIAL FIBRILLATION ABLATION;  Surgeon: Boyce Byes, MD;  Location: [facility] INVASIVE CV LAB;  Service: Cardiovascular;  Laterality: N/A;   BACK SURGERY  1999   CARDIOVERSION N/A 10/06/2020   Procedure: CARDIOVERSION;  Surgeon: Wendie Hamburg, MD;  Location: [facility] ENDOSCOPY;  Service: Cardiovascular;  Laterality: N/A;   COLONOSCOPY     MELANOMA EXCISION  2020   TEE WITHOUT CARDIOVERSION N/A 10/06/2020   Procedure: TRANSESOPHAGEAL ECHOCARDIOGRAM (TEE);  Surgeon: Wendie Hamburg, MD;  Location: [facility] ENDOSCOPY;  Service: Cardiovascular;  Laterality: N/A;   TEE WITHOUT CARDIOVERSION N/A 12/17/2020   Procedure: TRANSESOPHAGEAL ECHOCARDIOGRAM (TEE);  Surgeon: Harrold Lincoln, MD;  Location: [facility] INVASIVE CV LAB;  Service: Cardiovascular;  Laterality: N/A;     Social History:  The patient  reports that he has never smoked. He has never been exposed to tobacco smoke. His smokeless tobacco use includes snuff. He reports current alcohol use. He reports that he does not use drugs.   Family History:  The patient's family history includes Dementia in his mother; Healthy in  his sister and son; Heart attack in his father and mother.    ROS:  Please see the history of present illness. All other systems are reviewed and  Negative to the above problem except as noted.    PHYSICAL EXAM: VS:  BP (!) 130/90   Pulse 83   Ht 6\' 5"  (1.956 m)   Wt 223 lb 3.2 oz (101.2 kg)   SpO2 96%   BMI 26.47 kg/m    BP on recheck 120/78    GEN: OVerweight 68 yo  in no acute distress  HEENT: normal  Neck: JVP is not elevated    Cardiac: RRR; no murmurs  Tr LE edema  Respiratory:  clear to auscultation  GI: soft, nontender  No hepatomegaly   Extrem  Varicosities noted      EKG:  EKG is not ordered today.  Echo  June 2024   1. Left ventricular ejection fraction, by estimation, is 55 to 60%. The  left ventricle has normal function. The left ventricle has no regional  wall motion abnormalities. Left ventricular diastolic parameters are  consistent with Grade I diastolic  dysfunction (impaired relaxation).   2. Right ventricular systolic function is normal. The right ventricular  size is normal.   3. The mitral valve is normal in structure. Mild mitral valve  regurgitation. No evidence of mitral stenosis.   4. The aortic valve is normal in structure. Aortic valve regurgitation is  not visualized. No aortic stenosis is present.   5. The inferior vena cava is normal in size with greater than 50%  respiratory variability, suggesting right atrial pressure of 3 mmHg.    Cardiac CTA 2022   Coronary Arteries:  Normal coronary origin.  Right dominance.   RCA is a large dominant artery that gives rise to PDA and PLA. There is no plaque.   Left main is a large artery that gives rise to LAD and LCX arteries.   LAD is a large vessel that has no plaque.   LCX is a non-dominant artery that gives rise to two large OM branches. There is no plaque. (A portion of the proximal to mid vessel is not interpretable due to misregistration artifact).   Other findings:   Normal  pulmonary vein drainage into the left atrium.   RUPV: 19.2 x 15.8 mm, area 2.2 cm2   RLPV: 23.7 x 21.0 mm, area 3.82 cm2   LUPV: 20.2 x 15.6 mm, area 2.46 cm2   LLPV: 20.4 x 16.8 mm, area 2.71 cm2   Can not exclude left atrial appendage thrombus. There are no delayed acquisitions to confirm absence of clot.   Normal size of the pulmonary artery.  Please see radiology report for non cardiac findings.   IMPRESSION: 1. Coronary calcium  score of 0. This was 0 percentile for age and sex matched control.   2. Normal coronary origin with right dominance.   3. No evidence of CAD. (A small portion of the proximal to mid circumflex is not interpretable due to misregistration artifact).   4. Can not exclude left atrial appendage thrombus. There are no delayed acquisitions to confirm absence of clot. Consider repeat dedicated pulmonary vein protocol study or TEE.  Lipid Panel    Component Value Date/Time   CHOL 125 03/15/2023 0442   TRIG 42 03/15/2023 0442   HDL 36 (L) 03/15/2023 0442   CHOLHDL 3.5 03/15/2023 0442   VLDL 8 03/15/2023 0442   LDLCALC 81 03/15/2023 0442      Wt Readings from Last 3 Encounters:  07/24/23 223 lb 3.2 oz (101.2 kg)  07/10/23 219 lb 3.2 oz (99.4 kg)  05/25/23 224 lb (101.6 kg)      ASSESSMENT AND PLAN:  1  Atrial fibrillation.   Pt s/p ablation   Recurrence.  Cardioverted  Now on Tikosyn    HR regularly irreg     Last EKG SR with PACs Follow   Keep on Eliquis   2  LE edema  Pt has varicosities    Overall volume status OK REcomm:  Would get LE venous study Limit salt    Can take an additional lasix  and K 1 to 2 x per week Recomm support socks   3  Hx of HFrEF   TEE in 2022 LVEF 40 to 45 %  Felt tachy induced   LVEF on most recent echo normal Keep on same regimen       4  HTN  BP controlled on recheck  Keep on same meds   3 LIpids   LDL 81  HDL 36  Follow      Current medicines are reviewed at length with the patient today.  The patient  does not have concerns regarding medicines.  Signed, Ola Berger, MD  07/24/2023 10:58 AM    [facility] [address] [place], [place], [place]  [address] Phone: [phone]; Fax: [phone]

## 2023-07-24 ENCOUNTER — Encounter: Payer: Self-pay | Admitting: Internal Medicine

## 2023-07-24 ENCOUNTER — Ambulatory Visit: Attending: Internal Medicine | Admitting: Internal Medicine

## 2023-07-24 VITALS — BP 130/90 | HR 83 | Ht 77.0 in | Wt 223.2 lb

## 2023-07-24 DIAGNOSIS — R609 Edema, unspecified: Secondary | ICD-10-CM | POA: Diagnosis not present

## 2023-07-24 MED ORDER — METOPROLOL SUCCINATE ER 50 MG PO TB24: 50.0000 mg | ORAL_TABLET | Freq: Every day | ORAL | 3 refills | Status: DC

## 2023-07-24 MED ORDER — POTASSIUM CHLORIDE CRYS ER 20 MEQ PO TBCR: 20.0000 meq | EXTENDED_RELEASE_TABLET | Freq: Every day | ORAL | 3 refills | Status: DC

## 2023-07-24 MED ORDER — FUROSEMIDE 20 MG PO TABS: 20.0000 mg | ORAL_TABLET | Freq: Every day | ORAL | 3 refills | Status: DC

## 2023-07-24 MED ORDER — ENTRESTO 24-26 MG PO TABS: 1.0000 | ORAL_TABLET | Freq: Two times a day (BID) | ORAL | 3 refills | Status: AC

## 2023-07-24 NOTE — Patient Instructions (Addendum)
 Medication Instructions:   MAY TAKE 2 LASIX  AND POTASSIUM TWICE A WEEK FOR ADDED FLUID   *If you need a refill on your cardiac medications before your next appointment, please call your pharmacy*  Lab Work:  If you have labs (blood work) drawn today and your tests are completely normal, you will receive your results only by: MyChart Message (if you have MyChart) OR A paper copy in the mail If you have any lab test that is abnormal or we need to change your treatment, we will call you to review the results.  Testing/Procedures:  VENOUS BILATERAL DOPPLERS   Follow-Up: JANUARY 2025 At [facility], you and your health needs are our priority.  As part of our continuing mission to provide you with exceptional heart care, our providers are all part of one team.  This team includes your primary Cardiologist (physician) and Advanced Practice Providers or APPs (Physician Assistants and Nurse Practitioners) who all work together to provide you with the care you need, when you need it.  Your next appointment:   We recommend signing up for the patient portal called "MyChart".  Sign up information is provided on this After Visit Summary.  MyChart is used to connect with patients for Virtual Visits (Telemedicine).  Patients are able to view lab/test results, encounter notes, upcoming appointments, etc.  Non-urgent messages can be sent to your provider as well.   To learn more about what you can do with MyChart, go to [email or web address].   Other Instructions  Low-Sodium Eating Plan Salt (sodium) helps you keep a healthy balance of fluids in your body. Too much sodium can raise your blood pressure. It can also cause fluid and waste to be held in your body. Your health care provider or dietitian may recommend a low-sodium eating plan if you have high blood pressure (hypertension), kidney disease, liver disease, or heart failure. Eating less sodium can help lower your blood pressure and  reduce swelling. It can also protect your heart, liver, and kidneys. What are tips for following this plan? Reading food labels  Check food labels for the amount of sodium per serving. If you eat more than one serving, you must multiply the listed amount by the number of servings. Choose foods with less than 140 milligrams (mg) of sodium per serving. Avoid foods with 300 mg of sodium or more per serving. Always check how much sodium is in a product, even if the label says "unsalted" or "no salt added." Shopping  Buy products labeled as "low-sodium" or "no salt added." Buy fresh foods. Avoid canned foods and pre-made or frozen meals. Avoid canned, cured, or processed meats. Buy breads that have less than 80 mg of sodium per slice. Cooking  Eat more home-cooked food. Try to eat less restaurant, buffet, and fast food. Try not to add salt when you cook. Use salt-free seasonings or herbs instead of table salt or sea salt. Check with your provider or pharmacist before using salt substitutes. Cook with plant-based oils, such as canola, sunflower, or olive oil. Meal planning When eating at a restaurant, ask if your food can be made with less salt or no salt. Avoid dishes labeled as brined, pickled, cured, or smoked. Avoid dishes made with soy sauce, miso, or teriyaki sauce. Avoid foods that have monosodium glutamate (MSG) in them. MSG may be added to some restaurant food, sauces, soups, bouillon, and canned foods. Make meals that can be grilled, baked, poached, roasted, or steamed. These are often made with  less sodium. General information Try to limit your sodium intake to 1,500-2,300 mg each day, or the amount told by your provider. What foods should I eat? Fruits Fresh, frozen, or canned fruit. Fruit juice. Vegetables Fresh or frozen vegetables. "No salt added" canned vegetables. "No salt added" tomato sauce and paste. Low-sodium or reduced-sodium tomato and vegetable  juice. Grains Low-sodium cereals, such as oats, puffed wheat and rice, and shredded wheat. Low-sodium crackers. Unsalted rice. Unsalted pasta. Low-sodium bread. Whole grain breads and whole grain pasta. Meats and other proteins Fresh or frozen meat, poultry, seafood, and fish. These should have no added salt. Low-sodium canned tuna and salmon. Unsalted nuts. Dried peas, beans, and lentils without added salt. Unsalted canned beans. Eggs. Unsalted nut butters. Dairy Milk. Soy milk. Cheese that is naturally low in sodium, such as ricotta cheese, fresh mozzarella, or Swiss cheese. Low-sodium or reduced-sodium cheese. Cream cheese. Yogurt. Seasonings and condiments Fresh and dried herbs and spices. Salt-free seasonings. Low-sodium mustard and ketchup. Sodium-free salad dressing. Sodium-free light mayonnaise. Fresh or refrigerated horseradish. Lemon juice. Vinegar. Other foods Homemade, reduced-sodium, or low-sodium soups. Unsalted popcorn and pretzels. Low-salt or salt-free chips. The items listed above may not be all the foods and drinks you can have. Talk to a dietitian to learn more. What foods should I avoid? Vegetables Sauerkraut, pickled vegetables, and relishes. Olives. [place] fries. Onion rings. Regular canned vegetables, except low-sodium or reduced-sodium items. Regular canned tomato sauce and paste. Regular tomato and vegetable juice. Frozen vegetables in sauces. Grains Instant hot cereals. Bread stuffing, pancake, and biscuit mixes. Croutons. Seasoned rice or pasta mixes. Noodle soup cups. Boxed or frozen macaroni and cheese. Regular salted crackers. Self-rising flour. Meats and other proteins Meat or fish that is salted, canned, smoked, spiced, or pickled. Precooked or cured meat, such as sausages or meat loaves. Helene Loader. Ham. Pepperoni. Hot dogs. Corned beef. Chipped beef. Salt pork. Jerky. Pickled herring, anchovies, and sardines. Regular canned tuna. Salted nuts. Dairy Processed cheese  and cheese spreads. Hard cheeses. Cheese curds. Blue cheese. Feta cheese. String cheese. Regular cottage cheese. Buttermilk. Canned milk. Fats and oils Salted butter. Regular margarine. Ghee. Bacon fat. Seasonings and condiments Onion salt, garlic salt, seasoned salt, table salt, and sea salt. Canned and packaged gravies. Worcestershire sauce. Tartar sauce. Barbecue sauce. Teriyaki sauce. Soy sauce, including reduced-sodium soy sauce. Steak sauce. Fish sauce. Oyster sauce. Cocktail sauce. Horseradish that you find on the shelf. Regular ketchup and mustard. Meat flavorings and tenderizers. Bouillon cubes. Hot sauce. Pre-made or packaged marinades. Pre-made or packaged taco seasonings. Relishes. Regular salad dressings. Salsa. Other foods Salted popcorn and pretzels. Corn chips and puffs. Potato and tortilla chips. Canned or dried soups. Pizza. Frozen entrees and pot pies. The items listed above may not be all the foods and drinks you should avoid. Talk to a dietitian to learn more. This information is not intended to replace advice given to you by your health care provider. Make sure you discuss any questions you have with your health care provider. Document Revised: 04/06/2022 Document Reviewed: 04/06/2022 Elsevier Patient Education  2024 Elsevier Inc.      1st Floor: - Lobby - Registration  - Pharmacy  - Lab - Cafe  2nd Floor: - PV Lab - Diagnostic Testing (echo, CT, nuclear med)  3rd Floor: - Vacant  4th Floor: - TCTS (cardiothoracic surgery) - AFib Clinic - Structural Heart Clinic - Vascular Surgery  - Vascular Ultrasound  5th Floor: - [facility] (general and EP) - Clinical Pharmacy  for coumadin, hypertension, lipid, weight-loss medications, and med management appointments    Valet parking services will be available as well.

## 2023-07-31 ENCOUNTER — Other Ambulatory Visit (HOSPITAL_COMMUNITY): Payer: Self-pay | Admitting: Physician Assistant

## 2023-07-31 ENCOUNTER — Other Ambulatory Visit: Payer: Self-pay | Admitting: Internal Medicine

## 2023-07-31 DIAGNOSIS — I4819 Other persistent atrial fibrillation: Secondary | ICD-10-CM

## 2023-07-31 DIAGNOSIS — M059 Rheumatoid arthritis with rheumatoid factor, unspecified: Secondary | ICD-10-CM

## 2023-08-02 ENCOUNTER — Encounter (HOSPITAL_BASED_OUTPATIENT_CLINIC_OR_DEPARTMENT_OTHER)

## 2023-08-03 DIAGNOSIS — M25561 Pain in right knee: Secondary | ICD-10-CM | POA: Diagnosis not present

## 2023-08-03 DIAGNOSIS — M25461 Effusion, right knee: Secondary | ICD-10-CM | POA: Diagnosis not present

## 2023-08-03 DIAGNOSIS — M25562 Pain in left knee: Secondary | ICD-10-CM | POA: Diagnosis not present

## 2023-08-07 NOTE — Progress Notes (Deleted)
 Office Visit Note  Patient: Peter Mcmillan             Date of Birth: Jul 27, 1955           MRN: [id number]             PCP: Sinda Duel, PA Referring: Sinda Duel, [place] Visit Date: 08/16/2023   Subjective:  No chief complaint on file.   History of Present Illness: Peter Mcmillan is a 68 y.o. male here for follow up for follow up for seropositive RA on Humira 40 mg subcu q. 14 days.    Previous HPI 05/25/2023 Peter Mcmillan is a 68 y.o. male here for follow up for seropositive RA on Humira 40 mg subcu q. 14 days.  He has now taken about 4 doses in total and does feel like symptoms are getting better compared to our initial follow-up only 1 month into treatment.  Seeing a partial improvement in pain and swelling affecting both hands.  He is also discontinued the prednisone  completely again without a severe exacerbation of symptoms.     Previous HPI 04/23/2023 Peter Mcmillan is a 68 y.o. male here for follow up for follow up for seropositive RA. He presents with persistent joint pain and discomfort. He reports having taken four doses of Humira since the end of December, with another dose scheduled for the following week. Prior to this, he was on a daily regimen of prednisone  10mg , which he discontinued three weeks ago due to concerns about weight gain and potential cardiovascular risks.   Since discontinuing prednisone , the patient reports worsening arthritis symptoms, particularly in his hands and knees. He describes the pain as constant and sometimes so severe that it impedes his ability to perform daily tasks, including work. He also reports coldness in his feet, particularly at night, which he manages by wearing socks or heating a towel.   The patient expresses a strong desire to avoid opioid pain medications due to personal experiences with addiction in his family. He recalls a previous positive experience with gabapentin  for pain management  following an ankle injury and expresses interest in trying it again for his current symptoms.   The patient also mentions a recent hospital visit and upcoming colonoscopy, scheduled due to a recent episode of gastrointestinal bleeding.     Previous HPI 02/21/2023 Peter Mcmillan is a 68 y.o. male here for follow up for seropositive RA currently he is off medications.  He was taking methotrexate  25 mg p.o. weekly but stopped taking this medicine for several weeks because he had not seen any noticeable benefit despite a long treatment course.  He is also off of prednisone  since 2 weeks after completing the last prescription.  He is now having a flareup with joint pain stiffness and swelling in multiple areas started since Sunday 3 days ago.  He is also concerned because of most severe pain is in his feet and with extensive swelling throughout the feet and in his shins which has previously been a symptom when he was in A-fib with RVR. He discussed with his dermatologist about possibly starting biologic and after detailed skin exam and his history of melanoma they felt this was not currently contraindicated.   11/22/22 Peter Mcmillan is a 68 y.o. male here for follow up for seropositive RA on methotrexate  25 mg p.o. weekly folic acid  1 mg daily and prednisone  10 mg daily unfortunately still having significant joint pain and stiffness on a  daily basis.  Currently his biggest complaint is with the recurrence of elbow pain and swelling with a very large lump especially on the left side.  This is bothersome because it is crating a pressure area and hitting against things as well as pain in the joint and difficulty fully extending his elbow.   08/21/2022 Peter Mcmillan is a 68 y.o. male here for follow-up for seropositive RA after resuming methotrexate  25 mg p.o. weekly folic acid  1 mg daily and initial treatment with prednisone  taper starting from 20 mg over 12 days.  Initially had benefit  while taking the prednisone  since stopping it he immediately saw an increase in joint pain affecting bilateral hands and feet again has also developed swelling overlying the elbows and on anterior of both knees.  Very prolonged stiffness on a daily basis beginning after waking lasting until around 3 to 4 PM.   07/24/22 Peter Mcmillan is a 68 y.o. male here for follow up for seropositive RA previously on methotrexate  but has been out of the medication for about 2 months was taking 25 mg p.o. weekly he still on the folic acid  1 mg daily.  He has been experiencing increased joint pain and swelling in his hands shoulders and knees more severe on the right side than left.  Did not recall any other illness or injury prior to symptom worsening.  We spoke by phone 2 days ago and started on prednisone  taper he is on 20 mg today is day 3 so far seeing a partial improvement joint pain and swelling is decreased but still has prolonged morning stiffness and still having some swelling in that right hand and shoulder.  He had recent cardiology follow-up everything is doing well and not in A-fib.   09/16/21 Peter Mcmillan is a 68 y.o. male here for follow up for seropositive RA on methotrexate  15 mg PO weekly.  Hand pain is pretty stable without any major flareup or exacerbation.  His current complaint is due to right elbow swelling.  He developed painful bursitis this was evaluated at urgent care clinic last month and again about a week ago.  Not particularly hot or inflamed but is painful and just remains swollen.  He does not recall any trauma preceding the onset of the swelling.   06/13/2021 Peter Mcmillan is a 68 y.o. male here for follow up for seropositive RA on methotrexate  15 mg PO weekly after last visit trying adding turmeric and continuing off the orencia . He missed one does of methotrexate  due to running out of his medication and now has increased symptoms with bilateral hand pain and  swelling. Right hand is worst affected and difficult to use normally. He also has a new cough ongoing for the past month it is not productive. Cough is most common when talking, when sitting and eating, or after he lies down in bed to sleep. He is not yet taking any seasonal allergy medication for the year. He is not taking any cough medicines.   05/04/21 Lansana Guzman Prochnow is a 68 y.o. male here for follow up for seropositive RA on methotrexate  15 mg p.o. weekly.  He stopped taking the Orencia  injections for about a month ever since he was hospitalized for his A. fib.  He feels there was not much difference when he took the shots and has not noticed much difference since stopping them.  He continues having some joint pain stiffness and swelling mostly affecting his hands and knees.  He is curious about any other anti-inflammatory medicine or supplements to try in addition to his RA medicines.   Previous HPI 09/23/20 Marguis Durrer is a 68 y.o. male here for follow-up of seropositive rheumatoid arthritis on methotrexate  15 mg p.o. weekly and prednisone  20 mg daily.  He feels his symptoms have improved taking the medications although worsened greatly when he decrease his prednisone  down to 10 mg as directed and so went back to taking 20 daily.  He has been out of the methotrexate  since about 2 weeks ago.   No Rheumatology ROS completed.   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   Essential hypertension 06/11/2020   Gastro-esophageal reflux disease without esophagitis 06/11/2020   Hyperlipidemia 06/11/2020   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   Prediabetes 06/11/2020   Tobacco user 06/11/2020   Vitamin D  deficiency 06/11/2020   Bilateral hand pain 06/11/2020   Bilateral shoulder pain 06/11/2020   Atrial flutter (HCC) 12/09/2013   PULMONARY NODULE 08/18/2008   COUGH 08/18/2008    Past Medical History:  Diagnosis Date   Atrial flutter (HCC)    Bursitis of right elbow    Erectile dysfunction    Rheumatoid arthritis (HCC)    Tobacco abuse    Smokeless tobacco    Family History  Problem Relation Age of Onset   Heart attack Father    Heart attack Mother    Dementia Mother    Healthy Sister    Healthy Son    Past Surgical History:  Procedure Laterality Date   ATRIAL FIBRILLATION ABLATION N/A 12/17/2020   Procedure: ATRIAL FIBRILLATION ABLATION;  Surgeon: Boyce Byes, MD;  Location: [facility] INVASIVE CV LAB;  Service: Cardiovascular;  Laterality: N/A;   BACK SURGERY  1999   CARDIOVERSION N/A 10/06/2020   Procedure: CARDIOVERSION;  Surgeon: Wendie Hamburg, MD;  Location: [facility] ENDOSCOPY;  Service: Cardiovascular;  Laterality: N/A;   COLONOSCOPY     MELANOMA EXCISION  2020   TEE WITHOUT CARDIOVERSION N/A 10/06/2020   Procedure: TRANSESOPHAGEAL ECHOCARDIOGRAM (TEE);  Surgeon: Wendie Hamburg, MD;  Location: [facility] ENDOSCOPY;  Service: Cardiovascular;  Laterality: N/A;   TEE WITHOUT CARDIOVERSION N/A 12/17/2020   Procedure: TRANSESOPHAGEAL ECHOCARDIOGRAM (TEE);  Surgeon: Harrold Lincoln, MD;  Location[facility] INVASIVE CV LAB;  Service: Cardiovascular;  Laterality: N/A;   Social History   Social History Narrative   Not on file   Immunization History  Administered Date(s) Administered   Hep A / Hep B 02/17/2011   Influenza Split 01/29/2017   Moderna Sars-Covid-2 Vaccination 07/09/2019, 07/09/2019, 08/06/2019,  08/06/2019   Pneumococcal Polysaccharide-23 02/12/2009   Tdap 02/12/2009, 06/13/2017, 06/18/2020     Objective: Vital Signs: There were no vitals taken for this visit.   Physical Exam   Musculoskeletal Exam: ***  CDAI Exam: CDAI Score: -- Patient Global: --; Provider Global: -- Swollen: --; Tender: -- Joint Exam 08/16/2023   No joint exam has been documented for this visit   There is currently no information  documented on the homunculus. Go to the Rheumatology activity and complete the homunculus joint exam.  Investigation: No additional findings.  Imaging: No results found.  Recent Labs: Lab Results  Component Value Date   WBC 6.8 04/23/2023   HGB 15.4 04/23/2023   PLT 334 04/23/2023   NA 137 07/13/2023   K 4.4 07/13/2023   CL 101 07/13/2023   CO2 21 07/13/2023   GLUCOSE 100 (H) 07/13/2023   BUN 10 07/13/2023   CREATININE 0.83 07/13/2023   BILITOT 0.5 04/23/2023   ALKPHOS 42 03/14/2023   AST 18 04/23/2023   ALT 20 04/23/2023   PROT 7.2 04/23/2023   ALBUMIN 3.0 (L) 03/14/2023   CALCIUM  9.0 07/13/2023   GFRAA 111 09/23/2020   QFTBGOLDPLUS NEGATIVE 08/21/2022    Speciality Comments: No specialty comments available.  Procedures:  No procedures performed Allergies: Codeine, Other, Penicillin g, and Penicillins   Assessment / Plan:     Visit Diagnoses: No diagnosis found.  ***  Orders: No orders of the defined types were placed in this encounter.  No orders of the defined types were placed in this encounter.    Follow-Up Instructions: No follow-ups on file.   Glena Landau, RT  Note - This record has been created using [employer].  Chart creation errors have been sought, but may not always  have been located. Such creation errors do not reflect on  the standard of medical care.

## 2023-08-10 ENCOUNTER — Encounter (HOSPITAL_COMMUNITY): Payer: Self-pay | Admitting: Physician Assistant

## 2023-08-10 ENCOUNTER — Ambulatory Visit (HOSPITAL_COMMUNITY)
Admission: RE | Admit: 2023-08-10 | Discharge: 2023-08-10 | Disposition: A | Discharge status: Home / Self Care | Payer: PPO | Source: Ambulatory Visit | Attending: Physician Assistant | Admitting: Physician Assistant

## 2023-08-10 VITALS — BP 108/82 | HR 96 | Ht 77.0 in | Wt 217.0 lb

## 2023-08-10 DIAGNOSIS — I4819 Other persistent atrial fibrillation: Secondary | ICD-10-CM

## 2023-08-10 DIAGNOSIS — D6869 Other thrombophilia: Secondary | ICD-10-CM

## 2023-08-10 DIAGNOSIS — Z79899 Other long term (current) drug therapy: Secondary | ICD-10-CM | POA: Diagnosis not present

## 2023-08-10 DIAGNOSIS — I480 Paroxysmal atrial fibrillation: Secondary | ICD-10-CM | POA: Diagnosis not present

## 2023-08-10 DIAGNOSIS — Z5181 Encounter for therapeutic drug level monitoring: Secondary | ICD-10-CM | POA: Diagnosis not present

## 2023-08-10 NOTE — Progress Notes (Signed)
 Primary Care Physician: Sinda Duel, PA Primary Cardiologist: Dr Avanell Bob Primary Electrophysiologist: Dr Marven Slimmer  Referring Physician: Dr Cherie Corin Kiani Standing is a 68 y.o. male with a history of atrial flutter, atrial fibrillation, systolic dysfunction, RA who presents for follow up in the [facility]. Patient presented to the hospital 10/05/20 with afib RVR and was started on Eliquis  for a CHADS2VASC score of 1. He underwent TEE guided DCCV on 10/06/20. He was hospitalized again 11/01/20 for progressive dyspnea and lower extremity edema. He was cardioverted in the ED then admitted for observation and diuresis. He lost almost 30 lbs of fluid. Patient presented to the ER  8/14 with atrial flutter with RVR. He was successfully cardioverted.   Patient underwent afib and flutter ablation with Dr Marven Slimmer on 12/17/20. Unfortunately, he had recurrence of rapid afib and had another DCCV in the ED on 12/28/20. His metoprolol  was also increased. Patient is s/p dofetilide  loading 1/3-04/08/21. He converted with the medication and did not require DCCV.   Patient returns for follow up for atrial fibrillation and dofetilide  monitoring. He reports that he has done well from an afib standpoint with no interim symptoms. No significant bleeding issues on anticoagulation. His lower extremity edema is improved.   Today, he  denies symptoms of palpitations, chest pain, shortness of breath, orthopnea, PND, lower extremity edema, dizziness, presyncope, syncope, snoring, daytime somnolence, bleeding, or neurologic sequela. The patient is tolerating medications without difficulties and is otherwise without complaint today.    Atrial Fibrillation Risk Factors:  he does not have symptoms or diagnosis of sleep apnea. he does not have a history of rheumatic fever. he does not have a history of alcohol use. The patient does have a history of early familial atrial fibrillation or other  arrhythmias. Mother has afib.   Atrial Fibrillation Management history:  Previous antiarrhythmic drugs: dofetilide   Previous cardioversions: 10/06/20, 11/01/20, 11/14/20, 12/28/20 Previous ablations: 12/17/20 Anticoagulation history: Eliquis    Past Medical History:  Diagnosis Date   Atrial flutter (HCC)    Bursitis of right elbow    Erectile dysfunction    Rheumatoid arthritis (HCC)    Tobacco abuse    Smokeless tobacco    Current Outpatient Medications  Medication Sig Dispense Refill   Adalimumab (HUMIRA, 1 PEN, [place]) Inject into the skin. Per patient taking injection every 14 days.     apixaban  (ELIQUIS ) 5 MG TABS tablet TAKE ONE TABLET BY MOUTH TWICE A DAY 60 tablet 1   Cholecalciferol  100 MCG (4000 UT) CAPS Take 1 capsule (4,000 Units total) by mouth daily in the afternoon. 100 capsule 3   dofetilide  (TIKOSYN ) 250 MCG capsule TAKE ONE CAPSULE BY MOUTH TWICE DAILY 60 capsule 1   folic acid  (FOLVITE ) 1 MG tablet Take 1 tablet (1 mg total) by mouth daily. 90 tablet 3   furosemide  (LASIX ) 20 MG tablet Take 1 tablet (20 mg total) by mouth daily. 90 tablet 3   loratadine  (CLARITIN ) 10 MG tablet Take 10 mg by mouth daily as needed for allergies.     metoprolol  succinate (TOPROL -XL) 50 MG 24 hr tablet Take 1 tablet (50 mg total) by mouth daily. Take with or immediately following a meal. 90 tablet 3   omeprazole (PRILOSEC) 40 MG capsule Take 40 mg by mouth daily.     potassium chloride  SA (KLOR-CON  M) 20 MEQ tablet Take 1 tablet (20 mEq total) by mouth daily. 90 tablet 3   predniSONE  (DELTASONE ) 5 MG  tablet TAKE ONE TABLET (5 MG TOTAL) BY MOUTH DAILY AS NEEDED. 30 tablet 0   sacubitril -valsartan  (ENTRESTO ) 24-26 MG Take 1 tablet by mouth 2 (two) times daily. 180 tablet 3   VITAMIN D  PO Take 2 tablets by mouth in the morning.     No current facility-administered medications for this encounter.    ROS- All systems are reviewed and negative except as per the HPI above.  Physical  Exam: Vitals:   08/10/23 0831  BP: 108/82  Pulse: 96  Weight: 98.4 kg  Height: 6\' 5"  (1.956 m)     GEN: Well nourished, well developed in no acute distress CARDIAC: Regular rate and rhythm, no murmurs, rubs, gallops RESPIRATORY:  Clear to auscultation without rales, wheezing or rhonchi  ABDOMEN: Soft, non-tender, non-distended EXTREMITIES:  No edema; No deformity    Wt Readings from Last 3 Encounters:  08/10/23 98.4 kg  07/24/23 101.2 kg  07/10/23 99.4 kg    EKG today demonstrates  SR, NST Vent. rate 96 BPM PR interval 138 ms QRS duration 78 ms QT/QTcB 360/454 ms   Echo 03/17/23 demonstrated   1. Left ventricular ejection fraction, by estimation, is 55 to 60%. The  left ventricle has normal function. The left ventricle has no regional  wall motion abnormalities. Left ventricular diastolic parameters are  consistent with Grade I diastolic dysfunction (impaired relaxation).   2. Right ventricular systolic function is normal. The right ventricular  size is normal.   3. The mitral valve is normal in structure. Mild mitral valve  regurgitation. No evidence of mitral stenosis.   4. The aortic valve is normal in structure. Aortic valve regurgitation is  not visualized. No aortic stenosis is present.   5. The inferior vena cava is normal in size with greater than 50%  respiratory variability, suggesting right atrial pressure of 3 mmHg.    Epic records are reviewed at length today  CHA2DS2-VASc Score = 2  The patient's score is based upon: CHF History: 1 HTN History: 0 Diabetes History: 0 Stroke History: 0 Vascular Disease History: 0 Age Score: 1 Gender Score: 0       ASSESSMENT AND PLAN: Persistent Atrial Fibrillation (ICD10:  I48.19) The patient's CHA2DS2-VASc score is 2, indicating a 2.2% annual risk of stroke.   S/p afib and flutter ablation with Dr Marven Slimmer 12/17/20 Patient appears to be maintaining SR Continue dofetilide  250 mcg BID Continue Toprol  50 mg   Continue Eliquis  5 mg BID  Secondary Hypercoagulable State (ICD10:  D68.69) The patient is at significant risk for stroke/thromboembolism based upon his CHA2DS2-VASc Score of 2.  Continue Apixaban  (Eliquis ). No bleeding issues.   High Risk Medication Monitoring (ICD 10: Z79.899) QT interval on ECG acceptable for dofetilide  monitoring. Recent bmet/mag reviewed.   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     Myrtha Ates PA-C [facility] [address] [place], [place] [address] [phone]

## 2023-08-14 DIAGNOSIS — M25561 Pain in right knee: Secondary | ICD-10-CM | POA: Diagnosis not present

## 2023-08-16 ENCOUNTER — Encounter (HOSPITAL_COMMUNITY): Payer: Self-pay | Admitting: Family Medicine

## 2023-08-16 ENCOUNTER — Ambulatory Visit: Payer: PPO | Admitting: Internal Medicine

## 2023-08-16 ENCOUNTER — Ambulatory Visit (HOSPITAL_COMMUNITY)
Admission: RE | Admit: 2023-08-16 | Discharge: 2023-08-16 | Disposition: A | Discharge status: Home / Self Care | Source: Ambulatory Visit | Attending: Vascular Surgery | Admitting: Vascular Surgery

## 2023-08-16 DIAGNOSIS — M79661 Pain in right lower leg: Secondary | ICD-10-CM | POA: Diagnosis not present

## 2023-08-16 DIAGNOSIS — R609 Edema, unspecified: Secondary | ICD-10-CM | POA: Insufficient documentation

## 2023-08-16 DIAGNOSIS — Z79899 Other long term (current) drug therapy: Secondary | ICD-10-CM

## 2023-08-16 DIAGNOSIS — M79606 Pain in leg, unspecified: Secondary | ICD-10-CM

## 2023-08-16 DIAGNOSIS — Z7952 Long term (current) use of systemic steroids: Secondary | ICD-10-CM

## 2023-08-16 DIAGNOSIS — M059 Rheumatoid arthritis with rheumatoid factor, unspecified: Secondary | ICD-10-CM

## 2023-08-16 DIAGNOSIS — R209 Unspecified disturbances of skin sensation: Secondary | ICD-10-CM

## 2023-08-17 ENCOUNTER — Other Ambulatory Visit: Payer: Self-pay | Admitting: Internal Medicine

## 2023-08-17 DIAGNOSIS — M059 Rheumatoid arthritis with rheumatoid factor, unspecified: Secondary | ICD-10-CM

## 2023-08-17 DIAGNOSIS — M25561 Pain in right knee: Secondary | ICD-10-CM | POA: Diagnosis not present

## 2023-08-17 MED ORDER — PREDNISONE 5 MG PO TABS: 5.0000 mg | ORAL_TABLET | Freq: Every day | ORAL | 0 refills | Status: DC

## 2023-08-17 NOTE — Telephone Encounter (Signed)
 Patient contacted the office to request a medication refill.   1. Name of Medication: Prednisone   2. How are you currently taking this medication (dosage and times per day)? Once a day usual after breakfast pt did not know dosage    3. What pharmacy would you like for that to be sent to? [facility]

## 2023-08-17 NOTE — Telephone Encounter (Signed)
 Last Fill: 07/19/2023   Next Visit: 08/16/2023 (cancelled due to provider out of office,will reschedule)   Last Visit: 05/25/2023   Dx: Seropositive rheumatoid arthritis (HCC)    Current Dose per office note on 05/25/2023: prednisone  5 mg tablets for as needed daily    Okay to refill Prednisone ?

## 2023-08-20 ENCOUNTER — Other Ambulatory Visit: Payer: Self-pay | Admitting: Internal Medicine

## 2023-08-20 ENCOUNTER — Telehealth: Payer: Self-pay

## 2023-08-20 DIAGNOSIS — Z79899 Other long term (current) drug therapy: Secondary | ICD-10-CM

## 2023-08-20 DIAGNOSIS — Z111 Encounter for screening for respiratory tuberculosis: Secondary | ICD-10-CM

## 2023-08-20 DIAGNOSIS — M059 Rheumatoid arthritis with rheumatoid factor, unspecified: Secondary | ICD-10-CM

## 2023-08-20 NOTE — Telephone Encounter (Signed)
 Patient contacted the office and states he cannot take the Humira any more because it is too expensive. The patient states the Humira injection is now $1,000 a month. Patient states he has not taken the injection since March because of the cost. Patient states he is going to need to be prescribed something new. Patient states this last week he has been in a lot of pain, could barely get out of bed, and did not go to work Friday or today. Advised the patient I would send a message to Dr. [name] and the pharmacy team to see if there is anything that can be done. Advised the patient Dr. [name] will be out of office until next week. Please advise.

## 2023-08-20 NOTE — Telephone Encounter (Signed)
 Last Fill: Most recent refill appears to be 06/04/2023  Labs: 07/13/2023 BMP Glucose 100  04/23/2023 CBC WNL  TB Gold: 08/21/2022 Negative   Next Visit: Patient was schedule last week and has not been rescheduled yet  Last Visit: 05/25/2023  [id number] rheumatoid arthritis (HCC)   Current Dose per office note 05/25/2023: Humira 40 mg subcu q. 14 days   Patient did contact the office this morning and states he has not been on the Humira since March due to the cost of the medication. Sent the message to the pharmacy team to see if there was something that could be done to help.  Okay to refill Humira?

## 2023-08-21 DIAGNOSIS — M25461 Effusion, right knee: Secondary | ICD-10-CM | POA: Diagnosis not present

## 2023-08-21 DIAGNOSIS — M7121 Synovial cyst of popliteal space [Baker], right knee: Secondary | ICD-10-CM | POA: Diagnosis not present

## 2023-08-22 ENCOUNTER — Ambulatory Visit: Payer: Self-pay | Admitting: Internal Medicine

## 2023-08-22 NOTE — Telephone Encounter (Signed)
 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 Patient states he cancelled his Humira himself due to the cost and needs to start a new medication to get his arthritis under control. Advised the patient that the other physicians in the office are unable to see him and that Dr. [name] will not be back until next week. Advised the patient if the pain is severe to go to an orthopedic urgent care or any urgent care if necessary to hold him over. Patient asked to be scheduled. Advised the patient we have many people to reschedule and are still waiting until we hear back from Dr. [name] regarding when to schedule patients. Patient verbalized understanding.

## 2023-08-23 ENCOUNTER — Encounter (HOSPITAL_COMMUNITY)

## 2023-08-23 ENCOUNTER — Other Ambulatory Visit: Payer: Self-pay | Admitting: Internal Medicine

## 2023-08-23 DIAGNOSIS — M059 Rheumatoid arthritis with rheumatoid factor, unspecified: Secondary | ICD-10-CM

## 2023-08-23 NOTE — Telephone Encounter (Signed)
 Last Fill: 07/24/2022  Next Visit: Patient has not been reschedule yet  Last Visit: 05/25/2023  Dx: not mentioned  Current Dose per office note on 05/25/2023: not mentioned  Contacted the patient and he states he is still taking the folic acid  and will be running out.   Okay to refill Folic Acid ?

## 2023-08-30 ENCOUNTER — Ambulatory Visit: Attending: Internal Medicine | Admitting: Internal Medicine

## 2023-08-30 ENCOUNTER — Encounter: Payer: Self-pay | Admitting: Internal Medicine

## 2023-08-30 VITALS — BP 121/74 | HR 91 | Resp 16 | Ht 77.0 in | Wt 215.0 lb

## 2023-08-30 DIAGNOSIS — M25561 Pain in right knee: Secondary | ICD-10-CM

## 2023-08-30 DIAGNOSIS — M059 Rheumatoid arthritis with rheumatoid factor, unspecified: Secondary | ICD-10-CM

## 2023-08-30 MED ORDER — PREDNISONE 10 MG PO TABS
10.0000 mg | ORAL_TABLET | Freq: Every day | ORAL | 2 refills | Status: DC
Start: 2023-08-30 — End: 2023-09-27

## 2023-08-30 MED ORDER — ADALIMUMAB 40 MG/0.4ML ~~LOC~~ AJKT: 40.0000 mg | AUTO-INJECTOR | SUBCUTANEOUS | 2 refills | Status: DC

## 2023-08-30 MED ORDER — LIDOCAINE HCL 1 % IJ SOLN
2.0000 mL | INTRAMUSCULAR | Status: AC | PRN
Start: 2023-08-30 — End: 2023-08-30
  Administered 2023-08-30: 2 mL

## 2023-08-30 MED ORDER — TRIAMCINOLONE ACETONIDE 40 MG/ML IJ SUSP
40.0000 mg | INTRAMUSCULAR | Status: AC | PRN
  Administered 2023-08-30: 40 mg via INTRA_ARTICULAR

## 2023-08-30 NOTE — Progress Notes (Signed)
 Office Visit Note  Patient: Peter Mcmillan             Date of Birth: 10-21-1955           MRN: [id number]             PCP: Sinda Duel, PA Referring: Sinda Duel, [place] Visit Date: 08/30/2023   Subjective:  Follow-up (Medication prices for Humira , hasn't taken since March. Patient mentions numbness in his both feet.)     Discussed the use of AI scribe software for clinical note transcription with the patient, who gave verbal consent to proceed.  History of Present Illness   History of Present Illness: Peter Mcmillan is a 68 y.o. male here for follow up for follow up for seropositive RA.  He previously used Humira 40 mg [place] q14days, which was effective, but discontinued it due to cost issues, as it is classified as a tier six medication by his insurance. He has not continued this since March, He previously has tried methotrexate , which was not effective for him.  He was on prednisone  5 mg daily not controlled symptoms well. He has been on a prednisone  taper (6, 5, 4, 3, 2, 1) with the last 10 mg dose taken this morning. Despite this, his knee remains swollen, though there has been slight improvement. He notes that his knee 'bubbles' when walking, and he has difficulty standing up after sitting.  He has significant issues with his knees and feet, including tingling and numbness in the bottom of his feet around the toes, affecting both feet. His knee is swollen and painful, with a granular cyst identified by an orthopedic specialist last week. Attempts to drain the cyst were unsuccessful due to its hardened state.  He finds it difficult to function and has been unable to work for two weeks, stating, 'I couldn't even get out of bed.'      Previous HPI 05/25/2023 Peter Mcmillan is a 68 y.o. male here for follow up for seropositive RA on Humira 40 mg subcu q. 14 days.  He has now taken about 4 doses in total and does feel like symptoms are getting better  compared to our initial follow-up only 1 month into treatment.  Seeing a partial improvement in pain and swelling affecting both hands.  He is also discontinued the prednisone  completely again without a severe exacerbation of symptoms.     Previous HPI 04/23/2023 Peter Mcmillan is a 68 y.o. male here for follow up for follow up for seropositive RA. He presents with persistent joint pain and discomfort. He reports having taken four doses of Humira since the end of December, with another dose scheduled for the following week. Prior to this, he was on a daily regimen of prednisone  10mg , which he discontinued three weeks ago due to concerns about weight gain and potential cardiovascular risks.   Since discontinuing prednisone , the patient reports worsening arthritis symptoms, particularly in his hands and knees. He describes the pain as constant and sometimes so severe that it impedes his ability to perform daily tasks, including work. He also reports coldness in his feet, particularly at night, which he manages by wearing socks or heating a towel.   The patient expresses a strong desire to avoid opioid pain medications due to personal experiences with addiction in his family. He recalls a previous positive experience with gabapentin  for pain management following an ankle injury and expresses interest in trying it again for his current  symptoms.   The patient also mentions a recent hospital visit and upcoming colonoscopy, scheduled due to a recent episode of gastrointestinal bleeding.     Previous HPI 02/21/2023 Peter Mcmillan is a 68 y.o. male here for follow up for seropositive RA currently he is off medications.  He was taking methotrexate  25 mg p.o. weekly but stopped taking this medicine for several weeks because he had not seen any noticeable benefit despite a long treatment course.  He is also off of prednisone  since 2 weeks after completing the last prescription.  He is now having  a flareup with joint pain stiffness and swelling in multiple areas started since Sunday 3 days ago.  He is also concerned because of most severe pain is in his feet and with extensive swelling throughout the feet and in his shins which has previously been a symptom when he was in A-fib with RVR. He discussed with his dermatologist about possibly starting biologic and after detailed skin exam and his history of melanoma they felt this was not currently contraindicated.   11/22/22 Peter Mcmillan is a 68 y.o. male here for follow up for seropositive RA on methotrexate  25 mg p.o. weekly folic acid  1 mg daily and prednisone  10 mg daily unfortunately still having significant joint pain and stiffness on a daily basis.  Currently his biggest complaint is with the recurrence of elbow pain and swelling with a very large lump especially on the left side.  This is bothersome because it is crating a pressure area and hitting against things as well as pain in the joint and difficulty fully extending his elbow.   08/21/2022 Peter Mcmillan is a 68 y.o. male here for follow-up for seropositive RA after resuming methotrexate  25 mg p.o. weekly folic acid  1 mg daily and initial treatment with prednisone  taper starting from 20 mg over 12 days.  Initially had benefit while taking the prednisone  since stopping it he immediately saw an increase in joint pain affecting bilateral hands and feet again has also developed swelling overlying the elbows and on anterior of both knees.  Very prolonged stiffness on a daily basis beginning after waking lasting until around 3 to 4 PM.   07/24/22 Peter Mcmillan is a 68 y.o. male here for follow up for seropositive RA previously on methotrexate  but has been out of the medication for about 2 months was taking 25 mg p.o. weekly he still on the folic acid  1 mg daily.  He has been experiencing increased joint pain and swelling in his hands shoulders and knees more severe on  the right side than left.  Did not recall any other illness or injury prior to symptom worsening.  We spoke by phone 2 days ago and started on prednisone  taper he is on 20 mg today is day 3 so far seeing a partial improvement joint pain and swelling is decreased but still has prolonged morning stiffness and still having some swelling in that right hand and shoulder.  He had recent cardiology follow-up everything is doing well and not in A-fib.   09/16/21 Peter Mcmillan is a 67 y.o. male here for follow up for seropositive RA on methotrexate  15 mg PO weekly.  Hand pain is pretty stable without any major flareup or exacerbation.  His current complaint is due to right elbow swelling.  He developed painful bursitis this was evaluated at urgent care clinic last month and again about a week ago.  Not particularly hot or inflamed  but is painful and just remains swollen.  He does not recall any trauma preceding the onset of the swelling.   06/13/2021 Peter Mcmillan is a 68 y.o. male here for follow up for seropositive RA on methotrexate  15 mg PO weekly after last visit trying adding turmeric and continuing off the orencia . He missed one does of methotrexate  due to running out of his medication and now has increased symptoms with bilateral hand pain and swelling. Right hand is worst affected and difficult to use normally. He also has a new cough ongoing for the past month it is not productive. Cough is most common when talking, when sitting and eating, or after he lies down in bed to sleep. He is not yet taking any seasonal allergy medication for the year. He is not taking any cough medicines.   05/04/21 Peter Mcmillan is a 68 y.o. male here for follow up for seropositive RA on methotrexate  15 mg p.o. weekly.  He stopped taking the Orencia  injections for about a month ever since he was hospitalized for his A. fib.  He feels there was not much difference when he took the shots and has not noticed  much difference since stopping them.  He continues having some joint pain stiffness and swelling mostly affecting his hands and knees.  He is curious about any other anti-inflammatory medicine or supplements to try in addition to his RA medicines.   Previous HPI 09/23/20 Peter Mcmillan is a 68 y.o. male here for follow-up of seropositive rheumatoid arthritis on methotrexate  15 mg p.o. weekly and prednisone  20 mg daily.  He feels his symptoms have improved taking the medications although worsened greatly when he decrease his prednisone  down to 10 mg as directed and so went back to taking 20 daily.  He has been out of the methotrexate  since about 2 weeks ago.   DMARD Hx Humira 2024 Not affordable Methotrexate  2023-2024 not effective   Review of Systems  Constitutional:  Positive for fatigue.  HENT:  Negative for mouth sores and mouth dryness.   Eyes:  Negative for dryness.  Respiratory:  Negative for shortness of breath.   Cardiovascular:  Negative for chest pain and palpitations.  Gastrointestinal:  Positive for constipation. Negative for blood in stool and diarrhea.  Endocrine: Negative for increased urination.  Genitourinary:  Negative for involuntary urination.  Musculoskeletal:  Positive for joint pain, gait problem, joint pain, joint swelling, muscle weakness and muscle tenderness. Negative for myalgias, morning stiffness and myalgias.  Skin:  Positive for rash. Negative for color change, hair loss and sensitivity to sunlight.  Allergic/Immunologic: Negative for susceptible to infections.  Neurological:  Negative for dizziness, numbness and headaches.  Hematological:  Negative for swollen glands.  Psychiatric/Behavioral:  Positive for sleep disturbance. Negative for depressed mood. The patient is nervous/anxious.     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   Essential hypertension 06/11/2020   Gastro-esophageal reflux disease without esophagitis 06/11/2020   Hyperlipidemia 06/11/2020   Malignant melanoma  of trunk (HCC) 06/11/2020   Prediabetes 06/11/2020   Tobacco user 06/11/2020   Vitamin D  deficiency 06/11/2020   Bilateral hand pain 06/11/2020   Bilateral shoulder pain 06/11/2020   Atrial flutter (HCC) 12/09/2013   PULMONARY NODULE 08/18/2008   COUGH 08/18/2008    Past Medical History:  Diagnosis Date   Atrial flutter (HCC)    Bursitis of right elbow    Erectile dysfunction    Rheumatoid arthritis (HCC)    Tobacco abuse    Smokeless tobacco    Family History  Problem Relation Age of Onset   Heart attack Father    Heart attack Mother    Dementia Mother    Healthy Sister    Healthy Son    Past Surgical History:  Procedure Laterality Date   ATRIAL FIBRILLATION ABLATION N/A 12/17/2020   Procedure: ATRIAL FIBRILLATION ABLATION;  Surgeon: Boyce Byes, MD;  Location: [facility] INVASIVE CV LAB;  Service: Cardiovascular;  Laterality: N/A;   BACK SURGERY  1999   CARDIOVERSION N/A 10/06/2020   Procedure: CARDIOVERSION;  Surgeon: Wendie Hamburg, MD;  Location: [facility] ENDOSCOPY;  Service: Cardiovascular;  Laterality: N/A;   COLONOSCOPY     MELANOMA EXCISION  2020   TEE WITHOUT CARDIOVERSION N/A 10/06/2020   Procedure: TRANSESOPHAGEAL ECHOCARDIOGRAM (TEE);  Surgeon: Wendie Hamburg, MD;  Location: [facility] ENDOSCOPY;  Service: Cardiovascular;  Laterality: N/A;   TEE WITHOUT CARDIOVERSION N/A 12/17/2020   Procedure:  TRANSESOPHAGEAL ECHOCARDIOGRAM (TEE);  Surgeon: Harrold Lincoln, MD;  Location: [facility] INVASIVE CV LAB;  Service: Cardiovascular;  Laterality: N/A;   Social History   Social History Narrative   Not on file   Immunization History  Administered Date(s) Administered   Hep A / Hep B 02/17/2011   Influenza Split 01/29/2017   Moderna Sars-Covid-2 Vaccination 07/09/2019, 07/09/2019, 08/06/2019, 08/06/2019   Pneumococcal Polysaccharide-23 02/12/2009   Tdap 02/12/2009, 06/13/2017, 06/18/2020     Objective: Vital Signs: BP 121/74 (BP Location: Left Arm, Patient Position: Sitting, Cuff Size: Normal)   Pulse 91   Resp 16   Ht 6\' 5"  (1.956 m)   Wt 215 lb (97.5 kg)   BMI 25.50 kg/m    Physical Exam Eyes:     Conjunctiva/sclera: Conjunctivae normal.  Cardiovascular:     Rate and Rhythm: Normal rate and regular rhythm.  Pulmonary:     Effort: Pulmonary effort is normal.     Breath sounds: Normal breath sounds.  Musculoskeletal:     Right lower leg: No edema.     Left lower leg: No edema.  Lymphadenopathy:     Cervical: No cervical adenopathy.  Skin:    General: Skin is warm and dry.     Findings: No rash.  Neurological:     Mental Status: He is alert.  Psychiatric:        Mood and Affect: Mood normal.      Musculoskeletal Exam:  Shoulders full ROM no tenderness or swelling Elbows full ROM no tenderness or swelling No wrist tenderness to pressure, no palpable effusion Right MCP joints chronic thickening, lateral deviation, MCP and PIP tenderness to pressure, slightly limited in flexion and extension ROM Knees full ROM, right knee effusion present, most tender posteriorly or with full flexion Very large 1st MTP nodules, thick plantar callus  Investigation: No additional findings.  Imaging: VAS US  LOWER EXTREMITY VENOUS (DVT) Result Date: 08/16/2023  Lower Venous DVT Study Patient Name:  Peter Mcmillan  Date of Exam:   08/16/2023 Medical Rec #: [id number]  Accession #:    [id number] Date of Birth: Nov 11, 1955               Patient Gender: M Patient Age:   60 years Exam Location:  [address] Procedure:      VAS US  LOWER EXTREMITY VENOUS (DVT) Referring Phys: PAULA ROSS --------------------------------------------------------------------------------  Indications: Edema.  Risk Factors: None identified. Comparison Study: 11/02/20 Performing Technologist: Estanislao Heimlich  Examination Guidelines: A complete evaluation includes B-mode imaging, spectral Doppler, color Doppler, and power Doppler as needed of all accessible portions of each vessel. Bilateral testing is considered an integral part of a complete examination. Limited examinations for reoccurring indications may be performed as noted. The reflux portion of the exam is performed with the patient in reverse Trendelenburg.  +---------+---------------+---------+-----------+----------+-------------------+ RIGHT    CompressibilityPhasicitySpontaneityPropertiesThrombus Aging      +---------+---------------+---------+-----------+----------+-------------------+ CFV      Full           Yes      Yes                                      +---------+---------------+---------+-----------+----------+-------------------+ SFJ      Full                                                             +---------+---------------+---------+-----------+----------+-------------------+ FV Prox  Full                                                             +---------+---------------+---------+-----------+----------+-------------------+ FV Mid   Full                                                             +---------+---------------+---------+-----------+----------+-------------------+ FV DistalFull                                                             +---------+---------------+---------+-----------+----------+-------------------+ PFV      Full                                                              +---------+---------------+---------+-----------+----------+-------------------+ POP      Full           Yes      Yes                                      +---------+---------------+---------+-----------+----------+-------------------+ PTV  Full                    Yes                  Not well visualized +---------+---------------+---------+-----------+----------+-------------------+ PERO                                                  Not Visualized      +---------+---------------+---------+-----------+----------+-------------------+ Avascular fluid mass seen in pop fossa measuring 8.38 x 1.84 x 2.15 cm  +---------+---------------+---------+-----------+----------+-------------------+ LEFT     CompressibilityPhasicitySpontaneityPropertiesThrombus Aging      +---------+---------------+---------+-----------+----------+-------------------+ CFV      Full           Yes      Yes                                      +---------+---------------+---------+-----------+----------+-------------------+ SFJ      Full                                                             +---------+---------------+---------+-----------+----------+-------------------+ FV Prox  Full                                                             +---------+---------------+---------+-----------+----------+-------------------+ FV Mid   Full                                                             +---------+---------------+---------+-----------+----------+-------------------+ FV DistalFull                                                             +---------+---------------+---------+-----------+----------+-------------------+ PFV      Full                                                             +---------+---------------+---------+-----------+----------+-------------------+ POP      Full           Yes      Yes                                       +---------+---------------+---------+-----------+----------+-------------------+ PTV      Full  Yes                  Not well visualized +---------+---------------+---------+-----------+----------+-------------------+ PERO                             Yes                  Not well visualized +---------+---------------+---------+-----------+----------+-------------------+     Summary: BILATERAL: - No evidence of deep vein thrombosis seen in the lower extremities, bilaterally. - RIGHT: - A cystic structure is found in the popliteal fossa.  LEFT: - No cystic structure found in the popliteal fossa.  *See table(s) above for measurements and observations. Electronically signed by Peter Mcmillan on 08/16/2023 at 4:55:01 PM.    Final     Recent Labs: Lab Results  Component Value Date   WBC 6.8 04/23/2023   HGB 15.4 04/23/2023   PLT 334 04/23/2023   NA 137 07/13/2023   K 4.4 07/13/2023   CL 101 07/13/2023   CO2 21 07/13/2023   GLUCOSE 100 (H) 07/13/2023   BUN 10 07/13/2023   CREATININE 0.83 07/13/2023   BILITOT 0.5 04/23/2023   ALKPHOS 42 03/14/2023   AST 18 04/23/2023   ALT 20 04/23/2023   PROT 7.2 04/23/2023   ALBUMIN 3.0 (L) 03/14/2023   CALCIUM  9.0 07/13/2023   GFRAA 111 09/23/2020   QFTBGOLDPLUS NEGATIVE 08/21/2022    Speciality Comments: No specialty comments available.  Procedures:  Large Joint Inj: R knee on 08/30/2023 11:30 AM Indications: pain and joint swelling Details: 22 G 1.5 in needle, medial approach Medications: 2 mL lidocaine  1 %; 40 mg triamcinolone  acetonide 40 MG/ML Aspirate: 16 mL yellow and clear Outcome: tolerated well, no immediate complications Procedure, treatment alternatives, risks and benefits explained, specific risks discussed. Consent was given by the patient. Immediately prior to procedure a time out was called to verify the correct patient, procedure, equipment, support staff and site/side marked as required. Patient was  prepped and draped in the usual sterile fashion.     Allergies: Codeine, Other, Penicillin g, and Penicillins   Assessment / Plan:     Visit Diagnoses: Seropositive rheumatoid arthritis (HCC) - Plan: adalimumab (HUMIRA) 40 MG/0.4ML pen, predniSONE  (DELTASONE ) 10 MG tablet, Large Joint Inj: R knee Ongoing active symptoms not well controlled on just low dose prednisone  now. Discussed medication coverage options due to Humira cost, considering Part B benefits and alternatives like Cimzia, Remicade, or Simponi. 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 Orthopedic evaluation showed granular cyst resistant to drainage. Prednisone  taper provided minimal relief. Persistent fluid accumulation limits function and ambulation.  - Perform knee aspiration to alleviate swelling.   Tingling and numbness in feet Chronic bilateral tingling and numbness in feet, possibly related to rheumatologic condition. Gabapentin  300 mg at bedtime but symptoms staying mostly persistent.    Orders: Orders Placed This Encounter  Procedures   Large Joint Inj: R knee   Meds ordered this encounter  Medications   adalimumab (HUMIRA) 40 MG/0.4ML pen    Sig: Inject 0.4 mLs (40 mg total) into the skin every 14 (fourteen) days.    Dispense:  2 each    Refill:  2   predniSONE  (DELTASONE ) 10  MG tablet    Sig: Take 1 tablet (10 mg total) by mouth daily with breakfast.    Dispense:  30 tablet    Refill:  2     Follow-Up Instructions: Return in about 3 months (around 11/30/2023) for RA on ADA/GC/inj f/u 3mos.   Matt Song, MD  Note - This record has been created using [employer].  Chart creation errors have been sought, but may not always  have been  located. Such creation errors do not reflect on  the standard of medical care.

## 2023-08-30 NOTE — Telephone Encounter (Signed)
 Spoke with patient regarding Medicare Prescription Payment Plan to help pay for all his medications in monthly installments. He is interested in pursuing this option. He was advised to call his insurance company and request to enroll in MPPP. He will call the clinic once he is enrolled.   We also briefly discussed the possibility of enrolling in a Humira patient assistance program. Patient stated that his income will likely disqualify him from enrollment. He works and receives benefits from [employer].  Tolu Rodrigo Mcgranahan, PharmD [facility] PGY-1

## 2023-09-18 ENCOUNTER — Emergency Department (HOSPITAL_COMMUNITY)
Admission: EM | Admit: 2023-09-18 | Discharge: 2023-09-18 | Disposition: A | Discharge status: Home / Self Care | Attending: Emergency Medicine | Admitting: Emergency Medicine

## 2023-09-18 ENCOUNTER — Encounter (HOSPITAL_COMMUNITY): Payer: Self-pay

## 2023-09-18 ENCOUNTER — Emergency Department (HOSPITAL_COMMUNITY)

## 2023-09-18 ENCOUNTER — Other Ambulatory Visit: Payer: Self-pay

## 2023-09-18 ENCOUNTER — Telehealth: Payer: Self-pay

## 2023-09-18 DIAGNOSIS — R051 Acute cough: Secondary | ICD-10-CM

## 2023-09-18 DIAGNOSIS — R0602 Shortness of breath: Secondary | ICD-10-CM | POA: Diagnosis not present

## 2023-09-18 DIAGNOSIS — R079 Chest pain, unspecified: Secondary | ICD-10-CM | POA: Diagnosis not present

## 2023-09-18 DIAGNOSIS — Z79899 Other long term (current) drug therapy: Secondary | ICD-10-CM | POA: Diagnosis not present

## 2023-09-18 DIAGNOSIS — R918 Other nonspecific abnormal finding of lung field: Secondary | ICD-10-CM | POA: Diagnosis not present

## 2023-09-18 DIAGNOSIS — R059 Cough, unspecified: Secondary | ICD-10-CM | POA: Diagnosis present

## 2023-09-18 DIAGNOSIS — Z7901 Long term (current) use of anticoagulants: Secondary | ICD-10-CM | POA: Insufficient documentation

## 2023-09-18 DIAGNOSIS — R7989 Other specified abnormal findings of blood chemistry: Secondary | ICD-10-CM | POA: Insufficient documentation

## 2023-09-18 DIAGNOSIS — J168 Pneumonia due to other specified infectious organisms: Secondary | ICD-10-CM | POA: Diagnosis not present

## 2023-09-18 DIAGNOSIS — Z85820 Personal history of malignant melanoma of skin: Secondary | ICD-10-CM | POA: Diagnosis not present

## 2023-09-18 DIAGNOSIS — I10 Essential (primary) hypertension: Secondary | ICD-10-CM | POA: Diagnosis not present

## 2023-09-18 DIAGNOSIS — J189 Pneumonia, unspecified organism: Secondary | ICD-10-CM | POA: Insufficient documentation

## 2023-09-18 DIAGNOSIS — J180 Bronchopneumonia, unspecified organism: Secondary | ICD-10-CM | POA: Diagnosis not present

## 2023-09-18 DIAGNOSIS — M7989 Other specified soft tissue disorders: Secondary | ICD-10-CM | POA: Diagnosis not present

## 2023-09-18 DIAGNOSIS — R600 Localized edema: Secondary | ICD-10-CM | POA: Insufficient documentation

## 2023-09-18 DIAGNOSIS — R911 Solitary pulmonary nodule: Secondary | ICD-10-CM

## 2023-09-18 DIAGNOSIS — R509 Fever, unspecified: Secondary | ICD-10-CM | POA: Diagnosis not present

## 2023-09-18 LAB — URINALYSIS, W/ REFLEX TO CULTURE (INFECTION SUSPECTED)
Bacteria, UA: NONE SEEN
Bilirubin Urine: NEGATIVE
Glucose, UA: NEGATIVE mg/dL
Hgb urine dipstick: NEGATIVE
Ketones, ur: NEGATIVE mg/dL
Leukocytes,Ua: NEGATIVE
Nitrite: NEGATIVE
Protein, ur: NEGATIVE mg/dL
Specific Gravity, Urine: 1.024 (ref 1.005–1.030)
pH: 5 (ref 5.0–8.0)

## 2023-09-18 LAB — CBC WITH DIFFERENTIAL/PLATELET
Abs Immature Granulocytes: 0.14 10*3/uL — ABNORMAL HIGH (ref 0.00–0.07)
Basophils Absolute: 0.1 10*3/uL (ref 0.0–0.1)
Basophils Relative: 1 %
Eosinophils Absolute: 0 10*3/uL (ref 0.0–0.5)
Eosinophils Relative: 0 %
HCT: 43.6 % (ref 39.0–52.0)
Hemoglobin: 14.1 g/dL (ref 13.0–17.0)
Immature Granulocytes: 2 %
Lymphocytes Relative: 12 %
Lymphs Abs: 1 10*3/uL (ref 0.7–4.0)
MCH: 28.4 pg (ref 26.0–34.0)
MCHC: 32.3 g/dL (ref 30.0–36.0)
MCV: 87.7 fL (ref 80.0–100.0)
Monocytes Absolute: 0.7 10*3/uL (ref 0.1–1.0)
Monocytes Relative: 9 %
Neutro Abs: 5.9 10*3/uL (ref 1.7–7.7)
Neutrophils Relative %: 76 %
Platelets: 278 10*3/uL (ref 150–400)
RBC: 4.97 MIL/uL (ref 4.22–5.81)
RDW: 14.3 % (ref 11.5–15.5)
WBC: 7.8 10*3/uL (ref 4.0–10.5)
nRBC: 0 % (ref 0.0–0.2)

## 2023-09-18 LAB — COMPREHENSIVE METABOLIC PANEL WITH GFR
ALT: 21 U/L (ref 0–44)
AST: 17 U/L (ref 15–41)
Albumin: 3 g/dL — ABNORMAL LOW (ref 3.5–5.0)
Alkaline Phosphatase: 42 U/L (ref 38–126)
Anion gap: 10 (ref 5–15)
BUN: 9 mg/dL (ref 8–23)
CO2: 22 mmol/L (ref 22–32)
Calcium: 8.7 mg/dL — ABNORMAL LOW (ref 8.9–10.3)
Chloride: 102 mmol/L (ref 98–111)
Creatinine, Ser: 0.81 mg/dL (ref 0.61–1.24)
GFR, Estimated: >60 mL/min (ref >60)
Glucose, Bld: 119 mg/dL — ABNORMAL HIGH (ref 70–99)
Potassium: 4.1 mmol/L (ref 3.5–5.1)
Sodium: 134 mmol/L — ABNORMAL LOW (ref 135–145)
Total Bilirubin: 0.9 mg/dL (ref 0.0–1.2)
Total Protein: 7 g/dL (ref 6.5–8.1)

## 2023-09-18 LAB — CK: Total CK: 24 U/L — ABNORMAL LOW (ref 49–397)

## 2023-09-18 LAB — BRAIN NATRIURETIC PEPTIDE: B Natriuretic Peptide: 126.6 pg/mL — ABNORMAL HIGH (ref 0.0–100.0)

## 2023-09-18 LAB — D-DIMER, QUANTITATIVE: D-Dimer, Quant: >20 ug{FEU}/mL — ABNORMAL HIGH (ref 0.00–0.50)

## 2023-09-18 MED ORDER — IOHEXOL 350 MG/ML SOLN
75.0000 mL | Freq: Once | INTRAVENOUS | Status: AC | PRN
  Administered 2023-09-18: 75 mL via INTRAVENOUS

## 2023-09-18 MED ORDER — DOXYCYCLINE HYCLATE 100 MG PO TABS
100.0000 mg | ORAL_TABLET | Freq: Once | ORAL | Status: AC
  Administered 2023-09-18: 100 mg via ORAL
  Filled 2023-09-18: qty 1

## 2023-09-18 MED ORDER — DOXYCYCLINE HYCLATE 100 MG PO CAPS: 100.0000 mg | ORAL_CAPSULE | Freq: Two times a day (BID) | ORAL | 0 refills | Status: AC

## 2023-09-18 NOTE — ED Notes (Signed)
 Patient transported to CT

## 2023-09-18 NOTE — Discharge Instructions (Signed)
 Your history, exam, and workup today led us  to do a workup that did not show evidence of acute blood clot but is concerning for a small pneumonia in the setting of your recent fever, cough, chest pain, shortness of breath.  We went through all the labs together and feel you are safe for discharge home given your 9 hours of stability and reassuring vital signs.  Please call your primary doctor tomorrow for close follow-up.  Please take the antibiotics.  If any symptoms change or worsen acutely, please return to the nearest emergency department.  Thank you for your patience for as long as you are here and with all of the commotion going on around you this evening.

## 2023-09-18 NOTE — Telephone Encounter (Signed)
 Patients wife called the office back stating patient does have a fever of 100.3, she is going to take him to the hospital.

## 2023-09-18 NOTE — ED Provider Notes (Signed)
[place] EMERGENCY DEPARTMENT AT [facility] Provider Note   CSN: [id number] Arrival date & time: 09/18/23  1033     Patient presents with: Leg Swelling   Peter Mcmillan is a 68 y.o. male.   The history is provided by the patient, medical records and the spouse. No language interpreter was used.  Shortness of Breath Severity:  Moderate Onset quality:  Gradual Duration:  1 week Timing:  Constant Progression:  Worsening Chronicity:  New Context: URI   Relieved by:  Nothing Worsened by:  Deep breathing, coughing and exertion Ineffective treatments:  None tried Associated symptoms: chest pain, cough and fever   Associated symptoms: no abdominal pain, no diaphoresis, no headaches, no neck pain, no rash, no sputum production, no vomiting and no wheezing   Risk factors: no hx of PE/DVT        Prior to Admission medications   Medication Sig Start Date End Date Taking? Authorizing Provider  adalimumab  (HUMIRA ) 40 MG/0.4ML pen Inject 0.4 mLs (40 mg total) into the skin every 14 (fourteen) days. 08/30/23   Rice, Haig Levan, MD  apixaban  (ELIQUIS ) 5 MG TABS tablet TAKE ONE TABLET BY MOUTH TWICE A DAY 07/31/23   Fenton, Clint R, PA  Cholecalciferol  100 MCG (4000 UT) CAPS Take 1 capsule (4,000 Units total) by mouth daily in the afternoon. 06/12/22   Elmyra Haggard, MD  dofetilide  (TIKOSYN ) 250 MCG capsule TAKE ONE CAPSULE BY MOUTH TWICE DAILY 07/31/23   Fenton, Clint R, PA  furosemide  (LASIX ) 20 MG tablet Take 1 tablet (20 mg total) by mouth daily. 07/24/23   Elmyra Haggard, MD  loratadine  (CLARITIN ) 10 MG tablet Take 10 mg by mouth as needed for allergies. 11/02/22   [provider]  metoprolol  succinate (TOPROL -XL) 50 MG 24 hr tablet Take 1 tablet (50 mg total) by mouth daily. Take with or immediately following a meal. 07/24/23   Elmyra Haggard, MD  omeprazole (PRILOSEC) 40 MG capsule Take 40 mg by mouth daily. Patient not taking: Reported on 08/30/2023 08/09/22    [provider]  potassium chloride  SA (KLOR-CON  M) 20 MEQ tablet Take 1 tablet (20 mEq total) by mouth daily. 07/24/23   Elmyra Haggard, MD  predniSONE  (DELTASONE ) 10 MG tablet Take 1 tablet (10 mg total) by mouth daily with breakfast. 08/30/23   Matt Song, MD  sacubitril -valsartan  (ENTRESTO ) 24-26 MG Take 1 tablet by mouth 2 (two) times daily. 07/24/23   Elmyra Haggard, MD  traMADol (ULTRAM) 50 MG tablet Take 50 mg by mouth 3 (three) times daily as needed. 08/21/23   [provider]  VITAMIN D  PO Take 2 tablets by mouth in the morning.    [provider]    Allergies: Codeine, Other, Penicillin g, and Penicillins    Review of Systems  Constitutional:  Positive for chills, fatigue and fever. Negative for diaphoresis.  HENT:  Negative for congestion.   Eyes:  Negative for visual disturbance.  Respiratory:  Positive for cough, chest tightness and shortness of breath. Negative for sputum production, wheezing and stridor.   Cardiovascular:  Positive for chest pain and leg swelling. Negative for palpitations.  Gastrointestinal:  Negative for abdominal pain, constipation, diarrhea, nausea and vomiting.  Genitourinary:  Positive for dysuria.  Musculoskeletal:  Negative for back pain, neck pain and neck stiffness.  Skin:  Negative for rash.  Neurological:  Negative for weakness, light-headedness, numbness and headaches.  Psychiatric/Behavioral:  Negative for confusion.   All  other systems reviewed and are negative.   Updated Vital Signs BP 120/83   Pulse 90   Temp 98 F (36.7 C)   Resp 16   Ht 6' 5 (1.956 m)   Wt 101.2 kg   SpO2 100%   BMI 26.44 kg/m   Physical Exam Vitals and nursing note reviewed.  Constitutional:      General: He is not in acute distress.    Appearance: He is well-developed. He is not ill-appearing, toxic-appearing or diaphoretic.  HENT:     Head: Normocephalic and atraumatic.     Nose: Nose normal.     Mouth/Throat:      Mouth: Mucous membranes are moist.   Eyes:     Extraocular Movements: Extraocular movements intact.     Conjunctiva/sclera: Conjunctivae normal.    Cardiovascular:     Rate and Rhythm: Normal rate and regular rhythm.     Heart sounds: No murmur heard. Pulmonary:     Effort: Pulmonary effort is normal. No respiratory distress.     Breath sounds: Rhonchi and rales present. No wheezing.  Chest:     Chest wall: No tenderness.  Abdominal:     Palpations: Abdomen is soft.     Tenderness: There is no abdominal tenderness. There is no right CVA tenderness, left CVA tenderness, guarding or rebound.   Musculoskeletal:        General: No swelling or tenderness.     Cervical back: Neck supple. No tenderness.     Right lower leg: Edema present.     Left lower leg: Edema present.   Skin:    General: Skin is warm and dry.     Capillary Refill: Capillary refill takes less than 2 seconds.     Findings: No erythema or rash.   Neurological:     General: No focal deficit present.     Mental Status: He is alert.   Psychiatric:        Mood and Affect: Mood normal.     (all labs ordered are listed, but only abnormal results are displayed) Labs Reviewed  COMPREHENSIVE METABOLIC PANEL WITH GFR - Abnormal; Notable for the following components:      Result Value   Sodium 134 (*)    Glucose, Bld 119 (*)    Calcium  8.7 (*)    Albumin 3.0 (*)    All other components within normal limits  CBC WITH DIFFERENTIAL/PLATELET - Abnormal; Notable for the following components:   Abs Immature Granulocytes 0.14 (*)    All other components within normal limits  D-DIMER, QUANTITATIVE - Abnormal; Notable for the following components:   D-Dimer, Quant >20.00 (*)    All other components within normal limits  BRAIN NATRIURETIC PEPTIDE - Abnormal; Notable for the following components:   B Natriuretic Peptide 126.6 (*)    All other components within normal limits  CK - Abnormal; Notable for the following  components:   Total CK 24 (*)    All other components within normal limits  URINALYSIS, W/ REFLEX TO CULTURE (INFECTION SUSPECTED)    EKG: EKG Interpretation Date/Time:  Tuesday September 18 2023 16:21:22 EDT Ventricular Rate:  86 PR Interval:  133 QRS Duration:  93 QT Interval:  407 QTC Calculation: 430 R Axis:   29  Text Interpretation: Sinus rhythm Supraventricular bigeminy Borderline T abnormalities, anterior leads when compared to prior,  slower rate No STEMI Confirmed by Wynell Heath ([id number]) on 09/18/2023 4:22:39 PM  Radiology: CT Angio Chest PE W and/or  Wo Contrast Result Date: 09/18/2023 CLINICAL DATA:  Elevated D-dimer level. Lower extremity swelling. Shortness of breath and chest pain. EXAM: CT ANGIOGRAPHY CHEST WITH CONTRAST TECHNIQUE: Multidetector CT imaging of the chest was performed using the standard protocol during bolus administration of intravenous contrast. Multiplanar CT image reconstructions and MIPs were obtained to evaluate the vascular anatomy. RADIATION DOSE REDUCTION: This exam was performed according to the departmental dose-optimization program which includes automated exposure control, adjustment of the mA and/or kV according to patient size and/or use of iterative reconstruction technique. CONTRAST:  75mL OMNIPAQUE  IOHEXOL  350 MG/ML SOLN COMPARISON:  11/02/2020 FINDINGS: Cardiovascular: No filling defect is identified in the pulmonary arterial tree to suggest pulmonary embolus. Upper normal heart size. Mediastinum/Nodes: Unremarkable Lungs/Pleura: 3 mm right middle lobe nodule on image 72 series 6, not well seen on 11/02/2020. Stable 3 mm left lower lobe subpleural nodule along the major fissure on image 69 series 6. Upper Abdomen: Unremarkable Musculoskeletal: Unremarkable Review of the MIP images confirms the above findings. IMPRESSION: 1. No filling defect is identified in the pulmonary arterial tree to suggest pulmonary embolus. 2. Upper normal heart size. 3. 3 mm  right middle lobe nodule, not well seen on 11/02/2020. No follow-up needed if patient is low-risk.This recommendation follows the consensus statement: Guidelines for Management of Incidental Pulmonary Nodules Detected on CT Images: From the Fleischner Society 2017; Radiology 2017; 284:228-243. Electronically Signed   By: Freida Jes M.D.   On: 09/18/2023 17:43   DG Chest 2 View Result Date: 09/18/2023 CLINICAL DATA:  Fever EXAM: CHEST - 2 VIEW COMPARISON:  12/28/2020 FINDINGS: Frontal and lateral views of the chest demonstrate an unremarkable cardiac silhouette. There is minimal consolidation within the lingular segment of the left upper lobe adjacent to the cardiac apex on the frontal view, which could reflect atelectasis or focal pneumonia. No effusion or pneumothorax. No acute bony abnormalities. IMPRESSION: 1. Focal lingular consolidation favoring bronchopneumonia given clinical presentation. Electronically Signed   By: Bobbye Burrow M.D.   On: 09/18/2023 14:23     Procedures   Medications Ordered in the ED  iohexol  (OMNIPAQUE ) 350 MG/ML injection 75 mL (75 mLs Intravenous Contrast Given 09/18/23 1727)  doxycycline (VIBRA-TABS) tablet 100 mg (100 mg Oral Given 09/18/23 1955)                                    Medical Decision Making Amount and/or Complexity of Data Reviewed Labs: ordered. Radiology: ordered.  Risk Prescription drug management.    Kailon Treese is a 68 y.o. male with a past medical history significant for atrial fibrillation on Eliquis  therapy, previous melanoma, hypertension, hyperlipidemia, GERD, rheumatoid arthritis status post recent Humira  injections presents with worsening fevers, chills, cough, chest pain, shortness of breath, and leg swelling.  According to patient, he has had shortness of breath and difficulty both with exertion and lying flat for the last week or so.  He has had fluid on his legs and his ankles are swollen recently.  He denies  history of blood clots and he is on blood thinners for the A-fib.  He reports previously his orthopedic team was worried about possible blood clots.  He said he is not having any abdominal pain nausea, vomiting, constipation, diarrhea, or urinary changes.  He reports that with deep breathing and with exertion he is having some chest discomfort and shortness of breath.  He has had some cough and  reports he had a fever of 100.3 this morning.  He has not had any sick contacts to his knowledge.  On exam, patient does have rales and rhonchi in his lungs.  I did not hear wheezing.  Chest was nontender and abdomen was nontender.  Had pulses in extremities.  Legs are slightly diminished symmetrically bilaterally.  EKG does not show STEMI.  Patient had workup starting in triage including a D-dimer that was found to be greater than 20.  BNP was elevated at 126.6 which is slightly elevated.  It is not as high as it has been in the past several years ago it was over 400.  His metabolic panel did not show critical abnormality and a CBC was reassuring however the chest x-ray is concerning for focal bronchopneumonia.  Given his elevated D-dimer, the pleuritic chest pain and shortness of breath, and his elevated dimer, will get CT PE study and try to get ultrasounds.  Is unclear if ultrasound team still here or if they have left for the day.  Patient did say his urine was slightly painful earlier and was darker so we will add on CK and urinalysis.  Given the patient's fatigue, difficulty with ambulation, exertional and pleuritic chest pain shortness of breath, anticipate patient may end up needing admission after workup is completed but will wait for CT PE study and then have conversation about management.  CT scan did not show pulmonary embolism but did show small nodule.  We went through all the findings together and had a shared decision made conversation.  Patient would prefer to get antibiotics and go home for  small bronchopneumonia treatment and follow-up with PCP for the nodule and for his edema.  Given his reassuring vital signs and stability for over 9 hours here, we feel he is safe.  He understands return precautions and follow-up instructions and was discharged in good condition.      Final diagnoses:  Peripheral edema  Pneumonia due to infectious organism, unspecified laterality, unspecified part of lung  Acute cough  Shortness of breath  Positive D dimer  Solitary pulmonary nodule on lung CT    ED Discharge Orders          Ordered    doxycycline (VIBRAMYCIN) 100 MG capsule  2 times daily        09/18/23 1958            Clinical Impression: 1. Peripheral edema   2. Pneumonia due to infectious organism, unspecified laterality, unspecified part of lung   3. Acute cough   4. Shortness of breath   5. Positive D dimer   6. Solitary pulmonary nodule on lung CT     Disposition: Discharge  Condition: Good  I have discussed the results, Dx and Tx plan with the pt(& family if present). He/she/they expressed understanding and agree(s) with the plan. Discharge instructions discussed at great length. Strict return precautions discussed and pt &/or family have verbalized understanding of the instructions. No further questions at time of discharge.    New Prescriptions   DOXYCYCLINE (VIBRAMYCIN) 100 MG CAPSULE    Take 1 capsule (100 mg total) by mouth 2 (two) times daily for 10 days.    Follow Up: Sinda Duel, [place] [address] [place] [address] [phone]     [facility] Emergency Department at [facility] [address] [place]  [address] [phone]         Nyia Tsao, Marine Sia, MD  09/18/23 2001  

## 2023-09-18 NOTE — ED Provider Triage Note (Signed)
 Emergency Medicine Provider Triage Evaluation Note  Peter Mcmillan , a 68 y.o. male  was evaluated in triage.  Pt complains of lower extremity swelling, possible fever.  He is here with his wife.  Patient started Humira , received a second dose last week.  He now has lower extremity swelling bilaterally, slightly better today than last night and overnight, painful, symmetric.  Review of Systems  Positive: Fever Negative: Dyspnea  Physical Exam  BP 117/74 (BP Location: Left Arm)   Pulse 95   Temp 97.6 F (36.4 C)   Resp 20   Ht 6' 5 (1.956 m)   Wt 101.2 kg   SpO2 100%   BMI 26.44 kg/m  Gen:   Awake, no distress speaking clearly Resp:  Normal effort no increased work of breathing MSK:   Moves extremities without difficulty trace bilateral lower extremity edema Other:  Neuro grossly intact  Medical Decision Making  Medically screening exam initiated at 12:19 PM.  Appropriate orders placed.  Peter Mcmillan was informed that the remainder of the evaluation will be completed by another provider, this initial triage assessment does not replace that evaluation, and the importance of remaining in the ED until their evaluation is complete.   Dorenda Gandy, MD 09/18/23 [phone]

## 2023-09-18 NOTE — ED Notes (Signed)
 Extra urine sent to main lab

## 2023-09-18 NOTE — ED Notes (Signed)
 Patient given sandwich per MD verbal order.

## 2023-09-18 NOTE — Telephone Encounter (Signed)
 Patient's wife, Carmelina Chinchilla, contacted the office and states the patient cannot walk and can hardly make it to the bathroom. Carmelina Chinchilla states the patient has fluid on his knees and his ankles are swollen. Carmelina Chinchilla states the patient feels hot but she has not taken the patient's temperature. Carmelina Chinchilla states this has happened before but it just seems to be worse. Carmelina Chinchilla states the patient had taken his Humira  injection last night and she is not sure if the shot is making everything worse. Carmelina Chinchilla is concerned about the patient and is thinking about take the patient to the hospital and getting thorough blood work done to ensure nothing is wrong. Carmelina Chinchilla wanted me to send a message to Dr. [name] to get his opinion as well. Please advise.

## 2023-09-18 NOTE — ED Triage Notes (Signed)
 Pt c/o not being able to move legs due to pain that has been progressing over time. Pt states both of his feet are numb but in the past few days, his ankles have started swelling. Pt c/o shortness of breath and chest pain that worsens when he lays down.

## 2023-09-18 NOTE — ED Notes (Signed)
 Patient discharged in stable condition, education materials explained including, follow up, any prescriptions and reasons to return. Patient voiced agreement to education and discharge material.

## 2023-09-20 ENCOUNTER — Other Ambulatory Visit (HOSPITAL_COMMUNITY): Payer: Self-pay | Admitting: Physician Assistant

## 2023-09-20 ENCOUNTER — Telehealth: Payer: Self-pay | Admitting: *Deleted

## 2023-09-20 DIAGNOSIS — M059 Rheumatoid arthritis with rheumatoid factor, unspecified: Secondary | ICD-10-CM

## 2023-09-20 MED ORDER — TRAMADOL HCL 50 MG PO TABS
50.0000 mg | ORAL_TABLET | Freq: Three times a day (TID) | ORAL | 0 refills | Status: AC | PRN
Start: 2023-09-20 — End: ?

## 2023-09-20 NOTE — Telephone Encounter (Signed)
 I spoke with Peter Mcmillan he is currently experiencing increased mobility problems due to bilateral knee pain and leg swelling.  Swelling becomes much more severe once he is standing for any long duration improves with lying flat.  He took the second dose of Humira  after resuming it on Monday but subsequently developed worsening upper respiratory symptoms and then fever was seen at the emergency department with x-ray concerning for pneumonia.  He is now prescribed doxycycline twice daily for 10 days.  He is holding the prednisone  on this and symptoms are uncontrolled he feels he is not able to work due to leg inflammation.  Will send a prescription for tramadol 50 mg every 8 hours as needed for the next 10 days I recommend him to reach back out with an update on how symptoms are doing if this does not help or after 10 days with finishing the doxycycline about if we can resume the prednisone  or to try and recheck any of his RA markers.

## 2023-09-20 NOTE — Addendum Note (Signed)
 Addended by: Matt Song on: 09/20/2023 08:31 AM   Modules accepted: Orders

## 2023-09-20 NOTE — Telephone Encounter (Signed)
 Patient contacted the office stating he contacted the office on Monday and has not received a call back. Patient states he went to the ER for evaluation. Patient states he was diagnosed with pneumonia. Patient states he was prescribed antibiotics. Patient states he is having swelling around both of his knees that is causing him to have trouble walking and standing. Patient states he has had 2 doses of Humira  and this started after his second dose on Monday. Patient would like some relief. Please advise.

## 2023-09-24 ENCOUNTER — Other Ambulatory Visit (HOSPITAL_COMMUNITY): Payer: Self-pay | Admitting: Physician Assistant

## 2023-09-24 DIAGNOSIS — M25561 Pain in right knee: Secondary | ICD-10-CM | POA: Diagnosis not present

## 2023-09-24 DIAGNOSIS — I4819 Other persistent atrial fibrillation: Secondary | ICD-10-CM

## 2023-09-24 DIAGNOSIS — M25562 Pain in left knee: Secondary | ICD-10-CM | POA: Diagnosis not present

## 2023-09-27 ENCOUNTER — Ambulatory Visit: Attending: Internal Medicine | Admitting: Internal Medicine

## 2023-09-27 ENCOUNTER — Encounter: Payer: Self-pay | Admitting: Internal Medicine

## 2023-09-27 VITALS — BP 117/86 | HR 82 | Resp 14 | Ht 77.0 in | Wt 211.0 lb

## 2023-09-27 DIAGNOSIS — M059 Rheumatoid arthritis with rheumatoid factor, unspecified: Secondary | ICD-10-CM

## 2023-09-27 DIAGNOSIS — Z79899 Other long term (current) drug therapy: Secondary | ICD-10-CM | POA: Diagnosis not present

## 2023-09-27 MED ORDER — TRIAMCINOLONE ACETONIDE 40 MG/ML IJ SUSP
40.0000 mg | INTRAMUSCULAR | Status: AC | PRN
  Administered 2023-09-27: 40 mg via INTRA_ARTICULAR

## 2023-09-27 MED ORDER — LEFLUNOMIDE 20 MG PO TABS
20.0000 mg | ORAL_TABLET | Freq: Every day | ORAL | 1 refills | Status: DC
Start: 2023-09-27 — End: 2023-11-19

## 2023-09-27 MED ORDER — LIDOCAINE HCL 1 % IJ SOLN
2.0000 mL | INTRAMUSCULAR | Status: AC | PRN
  Administered 2023-09-27: 2 mL

## 2023-09-27 MED ORDER — PREDNISONE 10 MG PO TABS
10.0000 mg | ORAL_TABLET | Freq: Every day | ORAL | 0 refills | Status: DC
Start: 2023-09-27 — End: 2023-12-31

## 2023-09-27 NOTE — Progress Notes (Signed)
 Office Visit Note  Patient: Taevon Aschoff             Date of Birth: Jul 04, 1955           MRN: [id number]             PCP: Alben Therisa MATSU, PA Referring: Alben Therisa MATSU, [place] Visit Date: 09/27/2023   Subjective:  Follow-up (Patient states he is not doing good and needs something for relief. Patient states he does not like the tramadol  either. Patient would like to also talk about gel injections in his knees. )   Discussed the use of AI scribe software for clinical note transcription with the patient, who gave verbal consent to proceed.  History of Present Illness   Ronnell Clinger is a 68 year old male here for follow-up of seropositive rheumatoid arthritis with ongoing joint swelling and erosive radiographic changes currently on prednisone  and Humira .  He is currently on prednisone  increased [place]done direction to 20 mg for flare previously on 10 mg daily for the past few months. He has recently restarted Humira , having taken two doses, with the last dose being two weeks ago. He is concerned about the cost of Humira .  Also concerned because he felt the some kind of inflammation or swelling in the body got worse with resuming the Humira  and to have quite considerable pedal edema both self-reported and documented at recent emergency department visit.   Due to his symptoms, he has not been working for the past two weeks and has used up his vacation days. He has difficulty getting out of bed and missed a planned vacation. Both knees are tender, with the left being worse, and he reports numbness in his feet, which he attributes to not putting enough pressure on them while walking.  He reports a change in his eating habits and mental state over the past month, which has been a difficult period for him. He has lost some weight and attributes this to the stress and changes in his routine.   He has tried the tramadol  and gabapentin  for joint pains feels he does not tolerate these  medicines well that they cause a significant mental side effects.  Previous HPI 08/30/23 Tevin Shillingford Remo is a 68 y.o. male here for follow up for follow up for seropositive RA.   He previously used Humira  40 mg [place] q14days, which was effective, but discontinued it due to cost issues, as it is classified as a tier six medication by his insurance. He has not continued this since March, He previously has tried methotrexate , which was not effective for him.   He was on prednisone  5 mg daily not controlled symptoms well. He has been on a prednisone  taper (6, 5, 4, 3, 2, 1) with the last 10 mg dose taken this morning. Despite this, his knee remains swollen, though there has been slight improvement. He notes that his knee 'bubbles' when walking, and he has difficulty standing up after sitting.   He has significant issues with his knees and feet, including tingling and numbness in the bottom of his feet around the toes, affecting both feet. His knee is swollen and painful, with a granular cyst identified by an orthopedic specialist last week. Attempts to drain the cyst were unsuccessful due to its hardened state.   He finds it difficult to function and has been unable to work for two weeks, stating, 'I couldn't even get out of bed.'      Previous  HPI 05/25/2023 Domanik Rainville Statzer is a 68 y.o. male here for follow up for seropositive RA on Humira  40 mg [place] q. 14 days.  He has now taken about 4 doses in total and does feel like symptoms are getting better compared to our initial follow-up only 1 month into treatment.  Seeing a partial improvement in pain and swelling affecting both hands.  He is also discontinued the prednisone  completely again without a severe exacerbation of symptoms.     Previous HPI 04/23/2023 Ry Moody Weber is a 68 y.o. male here for follow up for follow up for seropositive RA. He presents with persistent joint pain and discomfort. He reports having taken four  doses of Humira  since the end of December, with another dose scheduled for the following week. Prior to this, he was on a daily regimen of prednisone  10mg , which he discontinued three weeks ago due to concerns about weight gain and potential cardiovascular risks.   Since discontinuing prednisone , the patient reports worsening arthritis symptoms, particularly in his hands and knees. He describes the pain as constant and sometimes so severe that it impedes his ability to perform daily tasks, including work. He also reports coldness in his feet, particularly at night, which he manages by wearing socks or heating a towel.   The patient expresses a strong desire to avoid opioid pain medications due to personal experiences with addiction in his family. He recalls a previous positive experience with gabapentin  for pain management following an ankle injury and expresses interest in trying it again for his current symptoms.   The patient also mentions a recent hospital visit and upcoming colonoscopy, scheduled due to a recent episode of gastrointestinal bleeding.     Previous HPI 02/21/2023 Woodruff Skirvin Gossard is a 68 y.o. male here for follow up for seropositive RA currently he is off medications.  He was taking methotrexate  25 mg p.o. weekly but stopped taking this medicine for several weeks because he had not seen any noticeable benefit despite a long treatment course.  He is also off of prednisone  since 2 weeks after completing the last prescription.  He is now having a flareup with joint pain stiffness and swelling in multiple areas started since Sunday 3 days ago.  He is also concerned because of most severe pain is in his feet and with extensive swelling throughout the feet and in his shins which has previously been a symptom when he was in A-fib with RVR. He discussed with his dermatologist about possibly starting biologic and after detailed skin exam and his history of melanoma they felt this was not  currently contraindicated.   11/22/22 Xhaiden Coombs Stuhr is a 68 y.o. male here for follow up for seropositive RA on methotrexate  25 mg p.o. weekly folic acid  1 mg daily and prednisone  10 mg daily unfortunately still having significant joint pain and stiffness on a daily basis.  Currently his biggest complaint is with the recurrence of elbow pain and swelling with a very large lump especially on the left side.  This is bothersome because it is crating a pressure area and hitting against things as well as pain in the joint and difficulty fully extending his elbow.   08/21/2022 Noland Pizano Robideau is a 68 y.o. male here for follow-up for seropositive RA after resuming methotrexate  25 mg p.o. weekly folic acid  1 mg daily and initial treatment with prednisone  taper starting from 20 mg over 12 days.  Initially had benefit while taking the prednisone  since stopping  it he immediately saw an increase in joint pain affecting bilateral hands and feet again has also developed swelling overlying the elbows and on anterior of both knees.  Very prolonged stiffness on a daily basis beginning after waking lasting until around 3 to 4 PM.   07/24/22 Delano Frate is a 68 y.o. male here for follow up for seropositive RA previously on methotrexate  but has been out of the medication for about 2 months was taking 25 mg p.o. weekly he still on the folic acid  1 mg daily.  He has been experiencing increased joint pain and swelling in his hands shoulders and knees more severe on the right side than left.  Did not recall any other illness or injury prior to symptom worsening.  We spoke by phone 2 days ago and started on prednisone  taper he is on 20 mg today is day 3 so far seeing a partial improvement joint pain and swelling is decreased but still has prolonged morning stiffness and still having some swelling in that right hand and shoulder.  He had recent cardiology follow-up everything is doing well and not in A-fib.    09/16/21 Yoltzin Barg is a 68 y.o. male here for follow up for seropositive RA on methotrexate  15 mg PO weekly.  Hand pain is pretty stable without any major flareup or exacerbation.  His current complaint is due to right elbow swelling.  He developed painful bursitis this was evaluated at urgent care clinic last month and again about a week ago.  Not particularly hot or inflamed but is painful and just remains swollen.  He does not recall any trauma preceding the onset of the swelling.   06/13/2021 Kerem Gilmer Carlyon is a 68 y.o. male here for follow up for seropositive RA on methotrexate  15 mg PO weekly after last visit trying adding turmeric and continuing off the orencia . He missed one does of methotrexate  due to running out of his medication and now has increased symptoms with bilateral hand pain and swelling. Right hand is worst affected and difficult to use normally. He also has a new cough ongoing for the past month it is not productive. Cough is most common when talking, when sitting and eating, or after he lies down in bed to sleep. He is not yet taking any seasonal allergy medication for the year. He is not taking any cough medicines.   05/04/21 Duan Scharnhorst Laabs is a 68 y.o. male here for follow up for seropositive RA on methotrexate  15 mg p.o. weekly.  He stopped taking the Orencia  injections for about a month ever since he was hospitalized for his A. fib.  He feels there was not much difference when he took the shots and has not noticed much difference since stopping them.  He continues having some joint pain stiffness and swelling mostly affecting his hands and knees.  He is curious about any other anti-inflammatory medicine or supplements to try in addition to his RA medicines.   Previous HPI 09/23/20 Damieon Armendariz is a 68 y.o. male here for follow-up of seropositive rheumatoid arthritis on methotrexate  15 mg p.o. weekly and prednisone  20 mg daily.  He feels his symptoms  have improved taking the medications although worsened greatly when he decrease his prednisone  down to 10 mg as directed and so went back to taking 20 daily.  He has been out of the methotrexate  since about 2 weeks ago.     DMARD Hx Humira  2024 Not affordable Orencia  Primary nonresponder  Methotrexate  2023-2024 not effective   Review of Systems  Constitutional:  Positive for fatigue.  HENT:  Negative for mouth sores and mouth dryness.   Eyes:  Negative for dryness.  Respiratory:  Positive for shortness of breath.   Cardiovascular:  Negative for chest pain and palpitations.  Gastrointestinal:  Positive for constipation. Negative for blood in stool and diarrhea.  Endocrine: Negative for increased urination.  Genitourinary:  Negative for involuntary urination.  Musculoskeletal:  Positive for joint pain, gait problem, joint pain, joint swelling, myalgias, muscle weakness, morning stiffness, muscle tenderness and myalgias.  Skin:  Negative for color change, rash, hair loss and sensitivity to sunlight.  Allergic/Immunologic: Negative for susceptible to infections.  Neurological:  Negative for dizziness and headaches.  Hematological:  Negative for swollen glands.  Psychiatric/Behavioral:  Positive for depressed mood and sleep disturbance. The patient is not nervous/anxious.     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   Essential hypertension 06/11/2020    Gastro-esophageal reflux disease without esophagitis 06/11/2020   Hyperlipidemia 06/11/2020   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   Prediabetes 06/11/2020   Tobacco user 06/11/2020   Vitamin D  deficiency 06/11/2020   Bilateral hand pain 06/11/2020   Bilateral shoulder pain 06/11/2020   Atrial flutter (HCC) 12/09/2013   PULMONARY NODULE 08/18/2008   COUGH 08/18/2008    Past Medical History:  Diagnosis Date   Atrial flutter (HCC)    Bursitis of right elbow    Erectile dysfunction    Rheumatoid arthritis (HCC)    Tobacco abuse    Smokeless tobacco    Family History  Problem Relation Age of Onset   Heart attack Father    Heart attack Mother    Dementia Mother    Healthy Sister    Healthy Son    Past Surgical History:  Procedure Laterality Date   ATRIAL FIBRILLATION ABLATION N/A 12/17/2020   Procedure: ATRIAL FIBRILLATION ABLATION;  Surgeon: Cindie Ole DASEN, MD;  Location: [facility] INVASIVE CV LAB;  Service: Cardiovascular;  Laterality: N/A;   BACK SURGERY  1999   CARDIOVERSION N/A 10/06/2020   Procedure: CARDIOVERSION;  Surgeon: Kate Lonni CROME, MD;  Location: [facility] ENDOSCOPY;  Service: Cardiovascular;  Laterality: N/A;   COLONOSCOPY     MELANOMA EXCISION  2020   TEE WITHOUT CARDIOVERSION N/A 10/06/2020   Procedure: TRANSESOPHAGEAL ECHOCARDIOGRAM (TEE);  Surgeon: Kate Lonni CROME, MD;  Location: [facility] ENDOSCOPY;  Service: Cardiovascular;  Laterality: N/A;   TEE WITHOUT CARDIOVERSION N/A 12/17/2020   Procedure: TRANSESOPHAGEAL ECHOCARDIOGRAM (TEE);  Surgeon: Barbaraann Darryle Ned, MD;  Location: [facility] INVASIVE CV LAB;  Service: Cardiovascular;  Laterality: N/A;   Social History   Social History Narrative   Not on file   Immunization History  Administered Date(s) Administered   Hep A / Hep B 02/17/2011   Influenza Split 01/29/2017   Moderna Sars-Covid-2 Vaccination 07/09/2019, 07/09/2019, 08/06/2019, 08/06/2019   Pneumococcal Polysaccharide-23 02/12/2009    Tdap 02/12/2009, 06/13/2017, 06/18/2020     Objective: Vital Signs: BP 117/86 (  BP Location: Left Arm, Patient Position: Sitting, Cuff Size: Normal)   Pulse 82   Resp 14   Ht 6' 5 (1.956 m)   Wt 211 lb (95.7 kg)   BMI 25.02 kg/m    Physical Exam  Eyes:     Conjunctiva/sclera: Conjunctivae normal.    Cardiovascular:     Rate and Rhythm: Normal rate and regular rhythm.  Pulmonary:     Effort: Pulmonary effort is normal.     Breath sounds: Normal breath sounds.   Musculoskeletal:     Right lower leg: No edema.     Left lower leg: No edema.  Lymphadenopathy:     Cervical: No cervical adenopathy.   Skin:    General: Skin is warm and dry.     Findings: No rash.   Neurological:     Mental Status: He is alert.   Psychiatric:        Mood and Affect: Mood normal.      Musculoskeletal Exam:  Shoulders full ROM no tenderness or swelling Elbows full ROM no tenderness or swelling No wrist tenderness to pressure, no palpable effusion Right MCP joints chronic thickening, mild subluxation, lateral deviation, MCP and PIP tenderness to pressure, slightly limited in flexion and extension ROM, left MCPs normal Knees full ROM but very painful b/l, left knee small effusion present Very large 1st MTP nodules, thick plantar callus  Investigation: No additional findings.  Imaging: CT Angio Chest PE W and/or Wo Contrast Result Date: 09/18/2023 CLINICAL DATA:  Elevated D-dimer level. Lower extremity swelling. Shortness of breath and chest pain. EXAM: CT ANGIOGRAPHY CHEST WITH CONTRAST TECHNIQUE: Multidetector CT imaging of the chest was performed using the standard protocol during bolus administration of intravenous contrast. Multiplanar CT image reconstructions and MIPs were obtained to evaluate the vascular anatomy. RADIATION DOSE REDUCTION: This exam was performed according to the departmental dose-optimization program which includes automated exposure control, adjustment of the mA  and/or kV according to patient size and/or use of iterative reconstruction technique. CONTRAST:  75mL OMNIPAQUE  IOHEXOL  350 MG/ML SOLN COMPARISON:  11/02/2020 FINDINGS: Cardiovascular: No filling defect is identified in the pulmonary arterial tree to suggest pulmonary embolus. Upper normal heart size. Mediastinum/Nodes: Unremarkable Lungs/Pleura: 3 mm right middle lobe nodule on image 72 series 6, not well seen on 11/02/2020. Stable 3 mm left lower lobe subpleural nodule along the major fissure on image 69 series 6. Upper Abdomen: Unremarkable Musculoskeletal: Unremarkable Review of the MIP images confirms the above findings. IMPRESSION: 1. No filling defect is identified in the pulmonary arterial tree to suggest pulmonary embolus. 2. Upper normal heart size. 3. 3 mm right middle lobe nodule, not well seen on 11/02/2020. No follow-up needed if patient is low-risk.This recommendation follows the consensus statement: Guidelines for Management of Incidental Pulmonary Nodules Detected on CT Images: From the Fleischner Society 2017; Radiology 2017; 284:228-243. Electronically Signed   By: Ryan Salvage M.D.   On: 09/18/2023 17:43   DG Chest 2 View Result Date: 09/18/2023 CLINICAL DATA:  Fever EXAM: CHEST - 2 VIEW COMPARISON:  12/28/2020 FINDINGS: Frontal and lateral views of the chest demonstrate an unremarkable cardiac silhouette. There is minimal consolidation within the lingular segment of the left upper lobe adjacent to the cardiac apex on the frontal view, which could reflect atelectasis or focal pneumonia. No effusion or pneumothorax. No acute bony abnormalities. IMPRESSION: 1. Focal lingular consolidation favoring bronchopneumonia given clinical presentation. Electronically Signed   By: Ozell Daring M.D.   On: 09/18/2023 14:23  Recent Labs: Lab Results  Component Value Date   WBC 7.8 09/18/2023   HGB 14.1 09/18/2023   PLT 278 09/18/2023   NA 134 (L) 09/18/2023   K 4.1 09/18/2023   CL 102  09/18/2023   CO2 22 09/18/2023   GLUCOSE 119 (H) 09/18/2023   BUN 9 09/18/2023   CREATININE 0.81 09/18/2023   BILITOT 0.9 09/18/2023   ALKPHOS 42 09/18/2023   AST 17 09/18/2023   ALT 21 09/18/2023   PROT 7.0 09/18/2023   ALBUMIN 3.0 (L) 09/18/2023   CALCIUM  8.7 (L) 09/18/2023   GFRAA 111 09/23/2020   QFTBGOLDPLUS NEGATIVE 08/21/2022    Speciality Comments: No specialty comments available.  Procedures:  Large Joint Inj: bilateral knee on 09/27/2023 11:25 AM Indications: pain and joint swelling Details: 27 G 1.5 in needle, anteromedial approach Medications (Right): 2 mL lidocaine  1 %; 40 mg triamcinolone  acetonide 40 MG/ML Medications (Left): 2 mL lidocaine  1 %; 40 mg triamcinolone  acetonide 40 MG/ML Procedure, treatment alternatives, risks and benefits explained, specific risks discussed. Consent was given by the patient. Immediately prior to procedure a time out was called to verify the correct patient, procedure, equipment, support staff and site/side marked as required. Patient was prepped and draped in the usual sterile fashion.     Allergies: Codeine, Other, Penicillin g, and Penicillins   Assessment / Plan:     Visit Diagnoses: Seropositive rheumatoid arthritis (HCC) - Gabapentin  300mg  at night for pain control, with potential to increase to two or three times daily if beneficial. - Plan: predniSONE  (DELTASONE ) 10 MG tablet, leflunomide (ARAVA) 20 MG tablet, Large Joint Inj: bilateral knee Still doing very poorly with active disease synovitis in multiple areas.  Radiographic progression on most recent imaging in the right hand.  2 doses of Humira  without benefit possibly getting some side effects he has had multiple interruptions with biologic DMARD due to access issue.  At this time we will recommend conventional oral small molecule with leflunomide continue the prednisone  for now. - Discontinue Humira  - Start leflunomide 20 mg daily - Continue prednisone  decreased to 10 mg  once daily - Bilateral knee intra-articular steroid injection today  High risk medication use  Discussed risks of leflunomide treatment including GI intolerance, cytopenias, hepatotoxicity, leukopenia, hypertension, weight loss, or infections.  Unclear if the increased leg swelling was really related to Humira  incidental or possible related to the higher use of steroids due to uncontrolled RA disease activity.  Recent labs reviewed including blood count and metabolic panel were appropriate did have slightly low albumin.  Left knee swelling Chronic swelling with fluid accumulation. Ultrasound shows free fluid and thickened joint lining.  Also appears to be osteoarthritis but I think symptoms at present are predominantly due to uncontrolled RA. - Perform cortisone injection with Kenalog  and lidocaine .     Orders: Orders Placed This Encounter  Procedures   Large Joint Inj: bilateral knee   Meds ordered this encounter  Medications   predniSONE  (DELTASONE ) 10 MG tablet    Sig: Take 1 tablet (10 mg total) by mouth daily with breakfast.    Dispense:  90 tablet    Refill:  0   leflunomide (ARAVA) 20 MG tablet    Sig: Take 1 tablet (20 mg total) by mouth daily.    Dispense:  30 tablet    Refill:  1     Follow-Up Instructions: Return in about 6 weeks (around 11/08/2023) for RA LEF start f/u 1-67mos.   Lonni LELON Ester, MD  Note - This record has been created using [employer].  Chart creation errors have been sought, but may not always  have been located. Such creation errors do not reflect on  the standard of medical care.

## 2023-09-27 NOTE — Progress Notes (Signed)
 Spoke with patient and wife regarding Medicare Prescription Payment plan if leflunomide does not end up being effective. Advised that any biologic or specialty medication will be expensive. Reviewed $2000 OOP max. They will call pharmacy to cancel shipment.  Sherry Pennant, PharmD, MPH, BCPS, CPP Clinical Pharmacist (Rheumatology and Pulmonology)

## 2023-10-01 ENCOUNTER — Encounter: Payer: Self-pay | Admitting: *Deleted

## 2023-10-01 ENCOUNTER — Telehealth: Payer: Self-pay

## 2023-10-01 NOTE — Telephone Encounter (Signed)
 Monique called from [facility] pharmacy to verify if patient was taking Humira  . Advised it has been discontinued per Lonni Ester. Spoke with Augustin to cancel the prescription.

## 2023-10-10 ENCOUNTER — Other Ambulatory Visit (HOSPITAL_COMMUNITY): Payer: Self-pay | Admitting: Physician Assistant

## 2023-10-16 DIAGNOSIS — I4819 Other persistent atrial fibrillation: Secondary | ICD-10-CM | POA: Diagnosis not present

## 2023-10-16 DIAGNOSIS — E291 Testicular hypofunction: Secondary | ICD-10-CM | POA: Diagnosis not present

## 2023-10-16 DIAGNOSIS — M059 Rheumatoid arthritis with rheumatoid factor, unspecified: Secondary | ICD-10-CM | POA: Diagnosis not present

## 2023-10-16 DIAGNOSIS — I5020 Unspecified systolic (congestive) heart failure: Secondary | ICD-10-CM | POA: Diagnosis not present

## 2023-10-16 DIAGNOSIS — K219 Gastro-esophageal reflux disease without esophagitis: Secondary | ICD-10-CM | POA: Diagnosis not present

## 2023-10-16 DIAGNOSIS — I4892 Unspecified atrial flutter: Secondary | ICD-10-CM | POA: Diagnosis not present

## 2023-10-16 DIAGNOSIS — Z0000 Encounter for general adult medical examination without abnormal findings: Secondary | ICD-10-CM | POA: Diagnosis not present

## 2023-10-16 DIAGNOSIS — Z79899 Other long term (current) drug therapy: Secondary | ICD-10-CM | POA: Diagnosis not present

## 2023-10-16 DIAGNOSIS — I10 Essential (primary) hypertension: Secondary | ICD-10-CM | POA: Diagnosis not present

## 2023-10-16 DIAGNOSIS — E785 Hyperlipidemia, unspecified: Secondary | ICD-10-CM | POA: Diagnosis not present

## 2023-10-16 DIAGNOSIS — N529 Male erectile dysfunction, unspecified: Secondary | ICD-10-CM | POA: Diagnosis not present

## 2023-10-16 DIAGNOSIS — E559 Vitamin D deficiency, unspecified: Secondary | ICD-10-CM | POA: Diagnosis not present

## 2023-10-16 DIAGNOSIS — R7303 Prediabetes: Secondary | ICD-10-CM | POA: Diagnosis not present

## 2023-11-06 ENCOUNTER — Ambulatory Visit (INDEPENDENT_AMBULATORY_CARE_PROVIDER_SITE_OTHER)

## 2023-11-06 ENCOUNTER — Encounter: Payer: Self-pay | Admitting: Internal Medicine

## 2023-11-06 ENCOUNTER — Telehealth: Payer: Self-pay

## 2023-11-06 ENCOUNTER — Ambulatory Visit: Attending: Internal Medicine | Admitting: Internal Medicine

## 2023-11-06 VITALS — BP 114/71 | HR 106 | Resp 18 | Ht 77.0 in | Wt 217.6 lb

## 2023-11-06 DIAGNOSIS — G8929 Other chronic pain: Secondary | ICD-10-CM

## 2023-11-06 DIAGNOSIS — R600 Localized edema: Secondary | ICD-10-CM | POA: Insufficient documentation

## 2023-11-06 DIAGNOSIS — M25562 Pain in left knee: Secondary | ICD-10-CM

## 2023-11-06 MED ORDER — TRIAMCINOLONE ACETONIDE 40 MG/ML IJ SUSP
40.0000 mg | INTRAMUSCULAR | Status: AC | PRN
  Administered 2023-11-06: 40 mg via INTRA_ARTICULAR

## 2023-11-06 MED ORDER — LIDOCAINE HCL 1 % IJ SOLN
2.0000 mL | INTRAMUSCULAR | Status: AC | PRN
Start: 2023-11-06 — End: 2023-11-06
  Administered 2023-11-06: 2 mL

## 2023-11-06 NOTE — Telephone Encounter (Signed)
 Patient called the office stating that his left knee is swollen again an would like to know if e could come in an get it drained again. Patient says its lot of pain and taking everything he has to get up to keep moving. Please advise

## 2023-11-06 NOTE — Progress Notes (Signed)
 Office Visit Note  Patient: Peter Mcmillan             Date of Birth: March 15, 1956           MRN: [id number]             PCP: Alben Therisa MATSU, PA Referring: Alben Therisa MATSU, [place] Visit Date: 11/06/2023   Subjective:  Follow-up (Left knee swelling in the ankle and knee. Worse when standing . )   History of Present Illness: Peter Mcmillan is a 68 y.o. male with seropositive rheumatoid arthritis on leflunomide  and prednisone  here for follow up due to flareup of joint pain and swelling affecting his left knee for the past 2 weeks.  Does not recall any specific change in activities or any injuries at the site.  He has been taking medicines consistently. He has not noticed any locking up instability or weakness at his knee joint.  He also noticed associated increase in peripheral swelling below the knee that is most prominent at the end of the day and improves by each morning.  Previous HPI 09/27/23 Peter Mcmillan is a 68 year old male here for follow-up of seropositive rheumatoid arthritis with ongoing joint swelling and erosive radiographic changes currently on prednisone  and Humira .   He is currently on prednisone  increased Azdone direction to 20 mg for flare previously on 10 mg daily for the past few months. He has recently restarted Humira , having taken two doses, with the last dose being two weeks ago. He is concerned about the cost of Humira .  Also concerned because he felt the some kind of inflammation or swelling in the body got worse with resuming the Humira  and to have quite considerable pedal edema both self-reported and documented at recent emergency department visit.    Due to his symptoms, he has not been working for the past two weeks and has used up his vacation days. He has difficulty getting out of bed and missed a planned vacation. Both knees are tender, with the left being worse, and he reports numbness in his feet, which he attributes to not putting enough  pressure on them while walking.   He reports a change in his eating habits and mental state over the past month, which has been a difficult period for him. He has lost some weight and attributes this to the stress and changes in his routine.    He has tried the tramadol  and gabapentin  for joint pains feels he does not tolerate these medicines well that they cause a significant mental side effects.   Previous HPI 08/30/23 Peter Mcmillan is a 68 y.o. male here for follow up for follow up for seropositive RA.   He previously used Humira  40 mg [place] q14days, which was effective, but discontinued it due to cost issues, as it is classified as a tier six medication by his insurance. He has not continued this since March, He previously has tried methotrexate , which was not effective for him.   He was on prednisone  5 mg daily not controlled symptoms well. He has been on a prednisone  taper (6, 5, 4, 3, 2, 1) with the last 10 mg dose taken this morning. Despite this, his knee remains swollen, though there has been slight improvement. He notes that his knee 'bubbles' when walking, and he has difficulty standing up after sitting.   He has significant issues with his knees and feet, including tingling and numbness in the bottom of his feet around  the toes, affecting both feet. His knee is swollen and painful, with a granular cyst identified by an orthopedic specialist last week. Attempts to drain the cyst were unsuccessful due to its hardened state.   He finds it difficult to function and has been unable to work for two weeks, stating, 'I couldn't even get out of bed.'      Previous HPI 05/25/2023 Peter Mcmillan is a 68 y.o. male here for follow up for seropositive RA on Humira  40 mg subcu q. 14 days.  He has now taken about 4 doses in total and does feel like symptoms are getting better compared to our initial follow-up only 1 month into treatment.  Seeing a partial improvement in pain and  swelling affecting both hands.  He is also discontinued the prednisone  completely again without a severe exacerbation of symptoms.     Previous HPI 04/23/2023 Peter Mcmillan is a 68 y.o. male here for follow up for follow up for seropositive RA. He presents with persistent joint pain and discomfort. He reports having taken four doses of Humira  since the end of December, with another dose scheduled for the following week. Prior to this, he was on a daily regimen of prednisone  10mg , which he discontinued three weeks ago due to concerns about weight gain and potential cardiovascular risks.   Since discontinuing prednisone , the patient reports worsening arthritis symptoms, particularly in his hands and knees. He describes the pain as constant and sometimes so severe that it impedes his ability to perform daily tasks, including work. He also reports coldness in his feet, particularly at night, which he manages by wearing socks or heating a towel.   The patient expresses a strong desire to avoid opioid pain medications due to personal experiences with addiction in his family. He recalls a previous positive experience with gabapentin  for pain management following an ankle injury and expresses interest in trying it again for his current symptoms.   The patient also mentions a recent hospital visit and upcoming colonoscopy, scheduled due to a recent episode of gastrointestinal bleeding.     Previous HPI 02/21/2023 Peter Mcmillan is a 68 y.o. male here for follow up for seropositive RA currently he is off medications.  He was taking methotrexate  25 mg p.o. weekly but stopped taking this medicine for several weeks because he had not seen any noticeable benefit despite a long treatment course.  He is also off of prednisone  since 2 weeks after completing the last prescription.  He is now having a flareup with joint pain stiffness and swelling in multiple areas started since Sunday 3 days ago.  He  is also concerned because of most severe pain is in his feet and with extensive swelling throughout the feet and in his shins which has previously been a symptom when he was in A-fib with RVR. He discussed with his dermatologist about possibly starting biologic and after detailed skin exam and his history of melanoma they felt this was not currently contraindicated.   11/22/22 Quinntin Malter Pasquarelli is a 68 y.o. male here for follow up for seropositive RA on methotrexate  25 mg p.o. weekly folic acid  1 mg daily and prednisone  10 mg daily unfortunately still having significant joint pain and stiffness on a daily basis.  Currently his biggest complaint is with the recurrence of elbow pain and swelling with a very large lump especially on the left side.  This is bothersome because it is crating a pressure area and hitting against things  as well as pain in the joint and difficulty fully extending his elbow.   08/21/2022 Kiven Vangilder Sliker is a 68 y.o. male here for follow-up for seropositive RA after resuming methotrexate  25 mg p.o. weekly folic acid  1 mg daily and initial treatment with prednisone  taper starting from 20 mg over 12 days.  Initially had benefit while taking the prednisone  since stopping it he immediately saw an increase in joint pain affecting bilateral hands and feet again has also developed swelling overlying the elbows and on anterior of both knees.  Very prolonged stiffness on a daily basis beginning after waking lasting until around 3 to 4 PM.   07/24/22 Jshawn Hurta is a 68 y.o. male here for follow up for seropositive RA previously on methotrexate  but has been out of the medication for about 2 months was taking 25 mg p.o. weekly he still on the folic acid  1 mg daily.  He has been experiencing increased joint pain and swelling in his hands shoulders and knees more severe on the right side than left.  Did not recall any other illness or injury prior to symptom worsening.  We  spoke by phone 2 days ago and started on prednisone  taper he is on 20 mg today is day 3 so far seeing a partial improvement joint pain and swelling is decreased but still has prolonged morning stiffness and still having some swelling in that right hand and shoulder.  He had recent cardiology follow-up everything is doing well and not in A-fib.   09/16/21 Willem Klingensmith is a 68 y.o. male here for follow up for seropositive RA on methotrexate  15 mg PO weekly.  Hand pain is pretty stable without any major flareup or exacerbation.  His current complaint is due to right elbow swelling.  He developed painful bursitis this was evaluated at urgent care clinic last month and again about a week ago.  Not particularly hot or inflamed but is painful and just remains swollen.  He does not recall any trauma preceding the onset of the swelling.   06/13/2021 Carlos Heber Mazer is a 68 y.o. male here for follow up for seropositive RA on methotrexate  15 mg PO weekly after last visit trying adding turmeric and continuing off the orencia . He missed one does of methotrexate  due to running out of his medication and now has increased symptoms with bilateral hand pain and swelling. Right hand is worst affected and difficult to use normally. He also has a new cough ongoing for the past month it is not productive. Cough is most common when talking, when sitting and eating, or after he lies down in bed to sleep. He is not yet taking any seasonal allergy medication for the year. He is not taking any cough medicines.   05/04/21 Kvion Shapley Homes is a 68 y.o. male here for follow up for seropositive RA on methotrexate  15 mg p.o. weekly.  He stopped taking the Orencia  injections for about a month ever since he was hospitalized for his A. fib.  He feels there was not much difference when he took the shots and has not noticed much difference since stopping them.  He continues having some joint pain stiffness and swelling  mostly affecting his hands and knees.  He is curious about any other anti-inflammatory medicine or supplements to try in addition to his RA medicines.   Previous HPI 09/23/20 Quantavis Obryant is a 68 y.o. male here for follow-up of seropositive rheumatoid arthritis on methotrexate  15 mg  p.o. weekly and prednisone  20 mg daily.  He feels his symptoms have improved taking the medications although worsened greatly when he decrease his prednisone  down to 10 mg as directed and so went back to taking 20 daily.  He has been out of the methotrexate  since about 2 weeks ago.     DMARD Hx Humira  2024 Not affordable Orencia  Primary nonresponder Methotrexate  2023-2024 not effective   Review of Systems  Constitutional:  Positive for fatigue.  HENT:  Negative for mouth sores and mouth dryness.   Eyes:  Negative for dryness.  Respiratory:  Positive for shortness of breath.   Cardiovascular:  Negative for chest pain and palpitations.  Gastrointestinal:  Positive for diarrhea. Negative for blood in stool and constipation.  Endocrine: Positive for increased urination.  Genitourinary:  Negative for involuntary urination.  Musculoskeletal:  Positive for joint pain, gait problem, joint pain, joint swelling, myalgias, muscle weakness, morning stiffness and myalgias. Negative for muscle tenderness.  Skin:  Negative for color change, rash, hair loss and sensitivity to sunlight.  Allergic/Immunologic: Negative for susceptible to infections.  Neurological:  Negative for dizziness and headaches.  Hematological:  Negative for swollen glands.  Psychiatric/Behavioral:  Negative for depressed mood and sleep disturbance. The patient is not nervous/anxious.     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   Essential hypertension 06/11/2020   Gastro-esophageal reflux disease without esophagitis 06/11/2020   Hyperlipidemia 06/11/2020   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   Prediabetes 06/11/2020   Tobacco user 06/11/2020   Vitamin D  deficiency 06/11/2020   Bilateral hand pain 06/11/2020   Bilateral shoulder pain 06/11/2020   Atrial flutter (HCC) 12/09/2013   PULMONARY NODULE 08/18/2008   COUGH 08/18/2008    Past Medical History:  Diagnosis Date   Atrial flutter (HCC)    Bursitis of right elbow    Erectile dysfunction    Rheumatoid arthritis (HCC)    Tobacco abuse    Smokeless tobacco    Family History  Problem Relation Age of Onset   Heart attack Father    Heart attack Mother    Dementia Mother    Healthy Sister    Healthy Son    Past Surgical History:  Procedure Laterality Date   ATRIAL FIBRILLATION ABLATION N/A 12/17/2020   Procedure: ATRIAL FIBRILLATION ABLATION;  Surgeon: Cindie Ole DASEN, MD;  Location: [facility] INVASIVE CV LAB;  Service: Cardiovascular;  Laterality: N/A;   BACK SURGERY  1999   CARDIOVERSION N/A 10/06/2020   Procedure: CARDIOVERSION;  Surgeon: Kate Lonni CROME, MD;  Location: [facility] ENDOSCOPY;  Service: Cardiovascular;  Laterality: N/A;   COLONOSCOPY     MELANOMA EXCISION  2020   TEE WITHOUT CARDIOVERSION N/A 10/06/2020   Procedure: TRANSESOPHAGEAL ECHOCARDIOGRAM (TEE);  Surgeon: Kate Lonni CROME, MD;  Location: [facility] ENDOSCOPY;  Service: Cardiovascular;  Laterality: N/A;   TEE WITHOUT CARDIOVERSION N/A 12/17/2020   Procedure: TRANSESOPHAGEAL ECHOCARDIOGRAM (TEE);  Surgeon: Barbaraann Darryle Ned, MD;   Location: [facility] INVASIVE  CV LAB;  Service: Cardiovascular;  Laterality: N/A;   Social History   Social History Narrative   Not on file   Immunization History  Administered Date(s) Administered   Hep A / Hep B 02/17/2011   Influenza Split 01/29/2017   Moderna Sars-Covid-2 Vaccination 07/09/2019, 07/09/2019, 08/06/2019, 08/06/2019   Pneumococcal Polysaccharide-23 02/12/2009   Tdap 02/12/2009, 06/13/2017, 06/18/2020     Objective: Vital Signs: BP 114/71 (BP Location: Left Arm, Patient Position: Sitting, Cuff Size: Normal)   Pulse (!) 106   Resp 18   Ht 6' 5 (1.956 m)   Wt 217 lb 9.6 oz (98.7 kg)   BMI 25.80 kg/m    Physical Exam Eyes:     Conjunctiva/sclera: Conjunctivae normal.  Cardiovascular:     Rate and Rhythm: Normal rate and regular rhythm.  Pulmonary:     Effort: Pulmonary effort is normal.     Breath sounds: Normal breath sounds.  Musculoskeletal:     Comments: 1+ right and 2+ left pitting edema up to part way between ankle and knee, no overlying erythema or skin changes  Lymphadenopathy:     Cervical: No cervical adenopathy.  Skin:    General: Skin is warm and dry.  Neurological:     Mental Status: He is alert.  Psychiatric:        Mood and Affect: Mood normal.      Musculoskeletal Exam:  Shoulders full ROM no tenderness or swelling Elbows full ROM no tenderness or swelling Right MCP joints chronic thickening, mild subluxation, lateral deviation, MCP and PIP tenderness to pressure, slightly limited in flexion and extension ROM, left MCPs normal  Knees full ROM, joint line tenderness to pressure along medial side and also pain with full flexion extension of left knee, small effusion present  Limited musculoskeletal ultrasound exam appears consistent for a medial meniscus tear  Investigation: No additional findings.  Imaging: XR KNEE 3 VIEW LEFT Result Date: 11/06/2023 X-ray left knee 3 views There is moderate medial compartment and minor lateral  compartment joint space narrowing.  Marginal bone spurring at the medial compartment.  Large superior patellar enthesophyte.  Small suprapatellar effusion present.  No abnormal soft tissue calcifications seen. 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 with progression compared to prior film from January 2022, also with small effusion present   Recent Labs: Lab Results  Component Value Date   WBC 7.8 09/18/2023   HGB 14.1 09/18/2023   PLT 278 09/18/2023   NA 134 (L) 09/18/2023   K 4.1 09/18/2023   CL 102 09/18/2023   CO2 22 09/18/2023   GLUCOSE 119 (H) 09/18/2023   BUN 9 09/18/2023   CREATININE 0.81 09/18/2023   BILITOT 0.9 09/18/2023   ALKPHOS 42 09/18/2023   AST 17 09/18/2023   ALT 21 09/18/2023   PROT 7.0 09/18/2023   ALBUMIN 3.0 (L) 09/18/2023   CALCIUM  8.7 (L) 09/18/2023   GFRAA 111 09/23/2020   QFTBGOLDPLUS NEGATIVE 08/21/2022    Speciality Comments: No specialty comments available.  Procedures:  Large Joint Inj: L knee on 11/06/2023 1:30 PM Indications: pain and joint swelling Details: 27 G needle, anteromedial approach Medications: 2 mL lidocaine  1 %; 40 mg triamcinolone  acetonide 40 MG/ML Procedure, treatment alternatives, risks and benefits explained, specific risks discussed. Immediately prior to procedure a time out was called to verify the correct patient, procedure, equipment, support staff and site/side marked as required. Patient was prepped and draped in the usual sterile fashion.     Allergies: Codeine, Other,  Penicillin g, and Penicillins   Assessment / Plan:     Visit Diagnoses: Chronic pain of left knee - Plan: XR KNEE 3 VIEW LEFT Acute on chronic knee pain with effusion present.  Suspect due to his ongoing RA disease activity although he is not in much of a flareup in other joints.  Ultrasound exam indicated looks like a medial meniscus tear and only small total effusion with a lot of synovial hypertrophy.  We obtain updated  left knee x-ray that shows radiographic progression of his osteoarthritis most advanced in the medial compartment but besides the small effusion there is no acute abnormality. - Left knee intra-articular steroid injection today - Continue leflunomide  20 mg daily and prednisone  10 mg daily for RA, follow-up scheduled for the 29th - Discussed use of compressive type knee sleeve for pain relief and stabilization - If knee effusion stays persistent despite overall activity he could benefit with MRI and orthopedic consultation  Peripheral edema Discussed his increase in left leg effusion I think it becoming at least in part due to compression at the knee without swelling.  He does have chronic peripheral edema no concerning stasis dermatitis or other changes.  We discussed symptomatic management with leg elevation or compression socks.  Orders: Orders Placed This Encounter  Procedures   Large Joint Inj   XR KNEE 3 VIEW LEFT   No orders of the defined types were placed in this encounter.    Follow-Up Instructions: No follow-ups on file.   Lonni LELON Ester, MD  Note - This record has been created using [employer].  Chart creation errors have been sought, but may not always  have been located. Such creation errors do not reflect on  the standard of medical care.

## 2023-11-06 NOTE — Telephone Encounter (Signed)
 Patient has been scheduled for 1:00 pm today.

## 2023-11-06 NOTE — Telephone Encounter (Signed)
 We should be able to do that I have to see it to say for certain though. There is some availability mid-day today if he could come that soon, otherwise whenever is available that works for him.

## 2023-11-12 DIAGNOSIS — U071 COVID-19: Secondary | ICD-10-CM

## 2023-11-12 HISTORY — DX: COVID-19: U07.1

## 2023-11-13 DIAGNOSIS — E291 Testicular hypofunction: Secondary | ICD-10-CM | POA: Diagnosis not present

## 2023-11-16 ENCOUNTER — Other Ambulatory Visit: Payer: Self-pay | Admitting: Internal Medicine

## 2023-11-16 DIAGNOSIS — M059 Rheumatoid arthritis with rheumatoid factor, unspecified: Secondary | ICD-10-CM

## 2023-11-16 NOTE — Telephone Encounter (Signed)
 Last Fill: 09/27/2023  Labs: 09/18/2023 Abs imma 0.14 Sodium 134 Glucose 119 Calcium  8.7 Albumin 3.0  Next Visit: 11/30/2023  Last Visit: 11/06/2023  DX: Chronic pain of left knee   Current Dose per office note 11/06/2023: leflunomide  20 mg daily   Okay to refill Arava  ?

## 2023-11-24 DIAGNOSIS — U071 COVID-19: Secondary | ICD-10-CM | POA: Diagnosis not present

## 2023-11-24 DIAGNOSIS — Z03818 Encounter for observation for suspected exposure to other biological agents ruled out: Secondary | ICD-10-CM | POA: Diagnosis not present

## 2023-11-24 DIAGNOSIS — R051 Acute cough: Secondary | ICD-10-CM | POA: Diagnosis not present

## 2023-11-28 ENCOUNTER — Telehealth (HOSPITAL_COMMUNITY): Payer: Self-pay | Admitting: *Deleted

## 2023-11-28 MED ORDER — METOPROLOL SUCCINATE ER 50 MG PO TB24: 50.0000 mg | ORAL_TABLET | Freq: Two times a day (BID) | ORAL | 2 refills | Status: AC

## 2023-11-28 NOTE — Telephone Encounter (Signed)
 Pt states he has always taken metoprolol  50mg  twice a day - the rx sent in was for 50mg  daily (looks like he was changed at dr okey visit in April to once a day although no documentation as to why) pt is now out of rx - should he continue 50mg  bid or do you want to decrease it once a day? Today his HR is 98 BP 138/83 but he hasn't had metoprolol  since Monday b/c couldn't get it filled due to insurance stated to early to fill. He says heart rates are usually in the 80s on BID dosing   Peter R Fenton, PA let's keep him on BID dosing. not sure why it was decreased.  Rx sent and pt notified.

## 2023-11-30 ENCOUNTER — Encounter: Payer: Self-pay | Admitting: Internal Medicine

## 2023-11-30 ENCOUNTER — Ambulatory Visit: Attending: Internal Medicine | Admitting: Internal Medicine

## 2023-11-30 VITALS — BP 116/74 | HR 87 | Resp 16 | Ht 77.0 in | Wt 210.0 lb

## 2023-11-30 DIAGNOSIS — I4819 Other persistent atrial fibrillation: Secondary | ICD-10-CM

## 2023-11-30 DIAGNOSIS — M059 Rheumatoid arthritis with rheumatoid factor, unspecified: Secondary | ICD-10-CM

## 2023-11-30 DIAGNOSIS — Z79899 Other long term (current) drug therapy: Secondary | ICD-10-CM

## 2023-11-30 NOTE — Progress Notes (Signed)
 Office Visit Note  Patient: Peter Mcmillan             Date of Birth: 10-08-1955           MRN: [id number]             PCP: Alben Therisa MATSU, PA Referring: Alben Therisa MATSU, [place] Visit Date: 11/30/2023   Subjective:  Follow-up    Discussed the use of AI scribe software for clinical note transcription with the patient, who gave verbal consent to proceed.  History of Present Illness   Peter Mcmillan is a 68 year old male here for follow-up on seropositive rheumatoid arthritis on leflunomide  20 mg daily prednisone  10 mg daily and after left knee steroid injection on August 5.  He was last seen on August 5th for a left knee steroid injection due to a flare-up of pain and swelling that had lasted about two weeks. Since the injection, the knee has improved, although he still experiences 'good days and bad days.' He has been wearing orthopedic socks during the day at work and has changed his boots, which he believes has helped.  His hands have been less problematic than before, but he still experiences some issues. His right wrist sometimes swells and his fingers sometimes lock up, although he does not currently feel much swelling in the fingers. He describes a 'trigger finger' that gets stuck bent sometimes, and he can feel a nodule slipping past his thumb.  He reports that since starting leflunomide  20 mg daily, he has been urinating more frequently. He has also been drinking more water to help flush his system.  Two weeks ago, he experienced a brief illness where he woke up not feeling well. He was tested and initially tested positive for COVID-19, but subsequent tests on the following days were negative. He did not experience fever, nausea, or other significant symptoms, and he believes it may have been more of a sinus infection or allergies. His wife also tested negative for COVID-19 during this period. No fever, nausea, or significant illness symptoms during his recent health  concern.       Previous HPI 09/27/2023 Peter Mcmillan is a 68 year old male here for follow-up of seropositive rheumatoid arthritis with ongoing joint swelling and erosive radiographic changes currently on prednisone  and Humira .   He is currently on prednisone  increased Azdone direction to 20 mg for flare previously on 10 mg daily for the past few months. He has recently restarted Humira , having taken two doses, with the last dose being two weeks ago. He is concerned about the cost of Humira .  Also concerned because he felt the some kind of inflammation or swelling in the body got worse with resuming the Humira  and to have quite considerable pedal edema both self-reported and documented at recent emergency department visit.    Due to his symptoms, he has not been working for the past two weeks and has used up his vacation days. He has difficulty getting out of bed and missed a planned vacation. Both knees are tender, with the left being worse, and he reports numbness in his feet, which he attributes to not putting enough pressure on them while walking.   He reports a change in his eating habits and mental state over the past month, which has been a difficult period for him. He has lost some weight and attributes this to the stress and changes in his routine.    He has tried the  tramadol  and gabapentin  for joint pains feels he does not tolerate these medicines well that they cause a significant mental side effects.   Previous HPI 08/30/23 Peter Mcmillan is a 68 y.o. male here for follow up for follow up for seropositive RA.   He previously used Humira  40 mg [place] q14days, which was effective, but discontinued it due to cost issues, as it is classified as a tier six medication by his insurance. He has not continued this since March, He previously has tried methotrexate , which was not effective for him.   He was on prednisone  5 mg daily not controlled symptoms well. He has been on a  prednisone  taper (6, 5, 4, 3, 2, 1) with the last 10 mg dose taken this morning. Despite this, his knee remains swollen, though there has been slight improvement. He notes that his knee 'bubbles' when walking, and he has difficulty standing up after sitting.   He has significant issues with his knees and feet, including tingling and numbness in the bottom of his feet around the toes, affecting both feet. His knee is swollen and painful, with a granular cyst identified by an orthopedic specialist last week. Attempts to drain the cyst were unsuccessful due to its hardened state.   He finds it difficult to function and has been unable to work for two weeks, stating, 'I couldn't even get out of bed.'      Previous HPI 05/25/2023 Peter Mcmillan is a 68 y.o. male here for follow up for seropositive RA on Humira  40 mg subcu q. 14 days.  He has now taken about 4 doses in total and does feel like symptoms are getting better compared to our initial follow-up only 1 month into treatment.  Seeing a partial improvement in pain and swelling affecting both hands.  He is also discontinued the prednisone  completely again without a severe exacerbation of symptoms.     Previous HPI 04/23/2023 Peter Mcmillan is a 68 y.o. male here for follow up for follow up for seropositive RA. He presents with persistent joint pain and discomfort. He reports having taken four doses of Humira  since the end of December, with another dose scheduled for the following week. Prior to this, he was on a daily regimen of prednisone  10mg , which he discontinued three weeks ago due to concerns about weight gain and potential cardiovascular risks.   Since discontinuing prednisone , the patient reports worsening arthritis symptoms, particularly in his hands and knees. He describes the pain as constant and sometimes so severe that it impedes his ability to perform daily tasks, including work. He also reports coldness in his feet,  particularly at night, which he manages by wearing socks or heating a towel.   The patient expresses a strong desire to avoid opioid pain medications due to personal experiences with addiction in his family. He recalls a previous positive experience with gabapentin  for pain management following an ankle injury and expresses interest in trying it again for his current symptoms.   The patient also mentions a recent hospital visit and upcoming colonoscopy, scheduled due to a recent episode of gastrointestinal bleeding.     Previous HPI 02/21/2023 Peter Mcmillan is a 68 y.o. male here for follow up for seropositive RA currently he is off medications.  He was taking methotrexate  25 mg p.o. weekly but stopped taking this medicine for several weeks because he had not seen any noticeable benefit despite a long treatment course.  He is also off of  prednisone  since 2 weeks after completing the last prescription.  He is now having a flareup with joint pain stiffness and swelling in multiple areas started since Sunday 3 days ago.  He is also concerned because of most severe pain is in his feet and with extensive swelling throughout the feet and in his shins which has previously been a symptom when he was in A-fib with RVR. He discussed with his dermatologist about possibly starting biologic and after detailed skin exam and his history of melanoma they felt this was not currently contraindicated.   11/22/22 Peter Mcmillan is a 68 y.o. male here for follow up for seropositive RA on methotrexate  25 mg p.o. weekly folic acid  1 mg daily and prednisone  10 mg daily unfortunately still having significant joint pain and stiffness on a daily basis.  Currently his biggest complaint is with the recurrence of elbow pain and swelling with a very large lump especially on the left side.  This is bothersome because it is crating a pressure area and hitting against things as well as pain in the joint and difficulty  fully extending his elbow.   08/21/2022 Peter Mcmillan is a 68 y.o. male here for follow-up for seropositive RA after resuming methotrexate  25 mg p.o. weekly folic acid  1 mg daily and initial treatment with prednisone  taper starting from 20 mg over 12 days.  Initially had benefit while taking the prednisone  since stopping it he immediately saw an increase in joint pain affecting bilateral hands and feet again has also developed swelling overlying the elbows and on anterior of both knees.  Very prolonged stiffness on a daily basis beginning after waking lasting until around 3 to 4 PM.   07/24/22 Peter Mcmillan is a 68 y.o. male here for follow up for seropositive RA previously on methotrexate  but has been out of the medication for about 2 months was taking 25 mg p.o. weekly he still on the folic acid  1 mg daily.  He has been experiencing increased joint pain and swelling in his hands shoulders and knees more severe on the right side than left.  Did not recall any other illness or injury prior to symptom worsening.  We spoke by phone 2 days ago and started on prednisone  taper he is on 20 mg today is day 3 so far seeing a partial improvement joint pain and swelling is decreased but still has prolonged morning stiffness and still having some swelling in that right hand and shoulder.  He had recent cardiology follow-up everything is doing well and not in A-fib.   09/16/21 Peter Mcmillan is a 68 y.o. male here for follow up for seropositive RA on methotrexate  15 mg PO weekly.  Hand pain is pretty stable without any major flareup or exacerbation.  His current complaint is due to right elbow swelling.  He developed painful bursitis this was evaluated at urgent care clinic last month and again about a week ago.  Not particularly hot or inflamed but is painful and just remains swollen.  He does not recall any trauma preceding the onset of the swelling.   06/13/2021 Peter Mcmillan is a  68 y.o. male here for follow up for seropositive RA on methotrexate  15 mg PO weekly after last visit trying adding turmeric and continuing off the orencia . He missed one does of methotrexate  due to running out of his medication and now has increased symptoms with bilateral hand pain and swelling. Right hand is worst affected and difficult  to use normally. He also has a new cough ongoing for the past month it is not productive. Cough is most common when talking, when sitting and eating, or after he lies down in bed to sleep. He is not yet taking any seasonal allergy medication for the year. He is not taking any cough medicines.   05/04/21 Peter Mcmillan is a 68 y.o. male here for follow up for seropositive RA on methotrexate  15 mg p.o. weekly.  He stopped taking the Orencia  injections for about a month ever since he was hospitalized for his A. fib.  He feels there was not much difference when he took the shots and has not noticed much difference since stopping them.  He continues having some joint pain stiffness and swelling mostly affecting his hands and knees.  He is curious about any other anti-inflammatory medicine or supplements to try in addition to his RA medicines.   Previous HPI 09/23/20 Peter Mcmillan is a 68 y.o. male here for follow-up of seropositive rheumatoid arthritis on methotrexate  15 mg p.o. weekly and prednisone  20 mg daily.  He feels his symptoms have improved taking the medications although worsened greatly when he decrease his prednisone  down to 10 mg as directed and so went back to taking 20 daily.  He has been out of the methotrexate  since about 2 weeks ago.     DMARD Hx Humira  2024 Not affordable Orencia  Primary nonresponder Methotrexate  2023-2024 not effective   Review of Systems  Constitutional:  Positive for fatigue.  HENT:  Negative for mouth sores and mouth dryness.   Eyes:  Negative for dryness.  Respiratory:  Positive for shortness of breath.    Cardiovascular:  Positive for palpitations. Negative for chest pain.  Gastrointestinal:  Positive for constipation and diarrhea. Negative for blood in stool.  Endocrine: Positive for increased urination.  Genitourinary:  Negative for involuntary urination.  Musculoskeletal:  Positive for joint pain, joint pain, joint swelling, myalgias, muscle weakness, morning stiffness, muscle tenderness and myalgias. Negative for gait problem.  Skin:  Positive for color change. Negative for rash, hair loss and sensitivity to sunlight.  Allergic/Immunologic: Positive for susceptible to infections.  Neurological:  Negative for dizziness and headaches.  Hematological:  Negative for swollen glands.  Psychiatric/Behavioral:  Negative for depressed mood and sleep disturbance. The patient is nervous/anxious.     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   Essential hypertension 06/11/2020   Gastro-esophageal reflux disease without esophagitis 06/11/2020   Hyperlipidemia 06/11/2020   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   Prediabetes 06/11/2020   Tobacco user 06/11/2020   Vitamin D  deficiency 06/11/2020   Bilateral hand pain 06/11/2020   Bilateral shoulder pain 06/11/2020   Atrial flutter (HCC) 12/09/2013   PULMONARY NODULE 08/18/2008   COUGH 08/18/2008    Past Medical History:  Diagnosis Date   Atrial flutter (HCC)    Bursitis of right elbow     COVID 11/12/2023   Erectile dysfunction  Rheumatoid arthritis (HCC)    Tobacco abuse    Smokeless tobacco    Family History  Problem Relation Age of Onset   Heart attack Father    Heart attack Mother    Dementia Mother    Healthy Sister    Healthy Son    Past Surgical History:  Procedure Laterality Date   ATRIAL FIBRILLATION ABLATION N/A 12/17/2020   Procedure: ATRIAL FIBRILLATION ABLATION;  Surgeon: Cindie Ole DASEN, MD;  Location: [facility] INVASIVE CV LAB;  Service: Cardiovascular;  Laterality: N/A;   BACK SURGERY  1999   CARDIOVERSION N/A 10/06/2020   Procedure: CARDIOVERSION;  Surgeon: Kate Lonni CROME, MD;  Location: [facility] ENDOSCOPY;  Service: Cardiovascular;  Laterality: N/A;   COLONOSCOPY     MELANOMA EXCISION  2020   TEE WITHOUT CARDIOVERSION N/A 10/06/2020   Procedure: TRANSESOPHAGEAL ECHOCARDIOGRAM (TEE);  Surgeon: Kate Lonni CROME, MD;  Location: [facility] ENDOSCOPY;  Service: Cardiovascular;  Laterality: N/A;   TEE WITHOUT CARDIOVERSION N/A 12/17/2020   Procedure: TRANSESOPHAGEAL ECHOCARDIOGRAM (TEE);  Surgeon: Barbaraann Darryle Ned, MD;  Location: [facility] INVASIVE CV LAB;  Service: Cardiovascular;  Laterality: N/A;   Social History   Social History Narrative   Not on file   Immunization History  Administered Date(s) Administered   Fluzone Influenza virus vaccine,trivalent (IIV3), split virus 01/29/2017   Hep A / Hep B 02/17/2011   Moderna Sars-Covid-2 Vaccination 07/09/2019, 07/09/2019, 08/06/2019, 08/06/2019   Pneumococcal Polysaccharide-23 02/12/2009   Tdap 02/12/2009, 06/13/2017, 06/18/2020     Objective: Vital Signs: BP 116/74 (BP Location: Left Arm, Patient Position: Sitting, Cuff Size: Normal)   Pulse 87   Resp 16   Ht 6' 5 (1.956 m)   Wt 210 lb (95.3 kg)   BMI 24.90 kg/m    Physical Exam Eyes:     Conjunctiva/sclera: Conjunctivae normal.  Cardiovascular:     Rate and Rhythm: Normal rate and regular rhythm.  Pulmonary:     Effort:  Pulmonary effort is normal.     Breath sounds: Normal breath sounds.  Musculoskeletal:     Right lower leg: No edema.     Left lower leg: No edema.  Lymphadenopathy:     Cervical: No cervical adenopathy.  Skin:    General: Skin is warm and dry.  Neurological:     Mental Status: He is alert.  Psychiatric:        Mood and Affect: Mood normal.      Musculoskeletal Exam:  Shoulders full ROM no tenderness or swelling Elbows full ROM no tenderness or swelling Mild to bilateral wrist swelling with warmth to touch right greater than left, no focal tenderness Right MCP joints chronic thickening, lateral deviation, MCP and PIP tenderness to pressure, slightly limited in flexion and extension ROM Knees full ROM, no palpable effusions  Investigation: No additional findings.  Imaging: XR KNEE 3 VIEW LEFT Result Date: 11/06/2023 X-ray left knee 3 views There is moderate medial compartment and minor lateral compartment joint space narrowing.  Marginal bone spurring at the medial compartment.  Large superior patellar enthesophyte.  Small suprapatellar effusion present.  No abnormal soft tissue calcifications seen. 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 with progression compared to prior film from January 2022, also with small effusion present   Recent Labs: Lab Results  Component Value Date   WBC 7.8 09/18/2023   HGB 14.1 09/18/2023   PLT 278 09/18/2023   NA 134 (L) 09/18/2023   K 4.1 09/18/2023   CL 102 09/18/2023   CO2  22 09/18/2023   GLUCOSE 119 (H) 09/18/2023   BUN 9 09/18/2023   CREATININE 0.81 09/18/2023   BILITOT 0.9 09/18/2023   ALKPHOS 42 09/18/2023   AST 17 09/18/2023   ALT 21 09/18/2023   PROT 7.0 09/18/2023   ALBUMIN 3.0 (L) 09/18/2023   CALCIUM  8.7 (L) 09/18/2023   GFRAA 111 09/23/2020   QFTBGOLDPLUS NEGATIVE 08/21/2022    Speciality Comments: No specialty comments available.  Procedures:  No procedures performed Allergies:  Codeine, Other, Penicillin g, and Penicillins   Assessment / Plan:     Visit Diagnoses: Seropositive rheumatoid arthritis (HCC) - Plan: Sedimentation rate, C-reactive protein Persistent joint inflammation, especially in the right wrist, with partial symptom improvement. Left knee improved post-steroid injection.  Had discussed pursuing orthopedic consultation or MRI but I do not believe it is indicated given good response in symptoms so far.  Increased urination noted with leflunomide  use but I would think this is more likely related to ongoing steroid at 10 mg daily dose.  Still believe he would potentially do better getting on biologic therapy as a way of getting off long-term steroids but has been cost prohibitive or had limited response to initial treatment. - Continue leflunomide  20 mg daily. - Continue prednisone , if inflammatory markers are doing well can try tapering down to 5 mg to decrease side effects - Checking sed rate and CRP for disease activity monitoring  High risk medication use - Plan: CBC with Differential/Platelet, Comprehensive metabolic panel with GFR Appears to be tolerating leflunomide  well without a particular intolerance.  Had 1 episode of upper respiratory infection was without complications did not require antibiotic treatment. - Checking CBC and CMP for medication monitoring on leflunomide  and prednisone  th persistent joint inflammation  Trigger finger, right hand Trigger finger with occasional locking, some nodule or restriction palpable at a1 pulley on exam but not severe and no reproducible triggering.. Symptoms less severe than before.  Easy bruising Reports of easy bruising, particularly in the wrists.        Orders: Orders Placed This Encounter  Procedures   Sedimentation rate   C-reactive protein   CBC with Differential/Platelet   Comprehensive metabolic panel with GFR   No orders of the defined types were placed in this encounter.    Follow-Up  Instructions: Return in about 3 months (around 03/01/2024) for RA on LEF/GC f/u 3oms.   Lonni LELON Ester, MD  Note - This record has been created using [employer].  Chart creation errors have been sought, but may not always  have been located. Such creation errors do not reflect on  the standard of medical care.

## 2023-12-02 LAB — COMPREHENSIVE METABOLIC PANEL WITH GFR
AG Ratio: 1.2 (calc) (ref 1.0–2.5)
ALT: 21 U/L (ref 9–46)
AST: 15 U/L (ref 10–35)
Albumin: 3.3 g/dL — ABNORMAL LOW (ref 3.6–5.1)
Alkaline phosphatase (APISO): 46 U/L (ref 35–144)
BUN: 16 mg/dL (ref 7–25)
CO2: 22 mmol/L (ref 20–32)
Calcium: 8.5 mg/dL — ABNORMAL LOW (ref 8.6–10.3)
Chloride: 108 mmol/L (ref 98–110)
Creat: 0.81 mg/dL (ref 0.70–1.35)
Globulin: 2.7 g/dL (ref 1.9–3.7)
Glucose, Bld: 156 mg/dL — ABNORMAL HIGH (ref 65–99)
Potassium: 3.8 mmol/L (ref 3.5–5.3)
Sodium: 138 mmol/L (ref 135–146)
Total Bilirubin: 0.7 mg/dL (ref 0.2–1.2)
Total Protein: 6 g/dL — ABNORMAL LOW (ref 6.1–8.1)
eGFR: 97 mL/min/1.73m2 (ref >=60)

## 2023-12-02 LAB — CBC WITH DIFFERENTIAL/PLATELET
Absolute Lymphocytes: 790 {cells}/uL — ABNORMAL LOW (ref 850–3900)
Absolute Monocytes: 800 {cells}/uL (ref 200–950)
Basophils Absolute: 70 {cells}/uL (ref 0–200)
Basophils Relative: 0.7 %
Eosinophils Absolute: 130 {cells}/uL (ref 15–500)
Eosinophils Relative: 1.3 %
HCT: 42.7 % (ref 38.5–50.0)
Hemoglobin: 13.9 g/dL (ref 13.2–17.1)
MCH: 29.1 pg (ref 27.0–33.0)
MCHC: 32.6 g/dL (ref 32.0–36.0)
MCV: 89.5 fL (ref 80.0–100.0)
MPV: 10.1 fL (ref 7.5–12.5)
Monocytes Relative: 8 %
Neutro Abs: 8210 {cells}/uL — ABNORMAL HIGH (ref 1500–7800)
Neutrophils Relative %: 82.1 %
Platelets: 318 Thousand/uL (ref 140–400)
RBC: 4.77 Million/uL (ref 4.20–5.80)
RDW: 13.6 % (ref 11.0–15.0)
Total Lymphocyte: 7.9 %
WBC: 10 Thousand/uL (ref 3.8–10.8)

## 2023-12-02 LAB — SEDIMENTATION RATE: Sed Rate: 19 mm/h (ref 0–20)

## 2023-12-02 LAB — C-REACTIVE PROTEIN: CRP: 36.5 mg/L — ABNORMAL HIGH (ref <8.0)

## 2023-12-10 DIAGNOSIS — E291 Testicular hypofunction: Secondary | ICD-10-CM | POA: Diagnosis not present

## 2023-12-18 DIAGNOSIS — E291 Testicular hypofunction: Secondary | ICD-10-CM | POA: Diagnosis not present

## 2023-12-31 ENCOUNTER — Ambulatory Visit: Payer: Self-pay | Admitting: Internal Medicine

## 2023-12-31 ENCOUNTER — Other Ambulatory Visit: Payer: Self-pay | Admitting: Internal Medicine

## 2023-12-31 ENCOUNTER — Other Ambulatory Visit: Payer: Self-pay

## 2023-12-31 DIAGNOSIS — M059 Rheumatoid arthritis with rheumatoid factor, unspecified: Secondary | ICD-10-CM

## 2023-12-31 NOTE — Telephone Encounter (Signed)
 Last Fill: 09/27/2023  Next Visit: 03/07/2024  Last Visit: 11/30/2023  Dx: Seropositive rheumatoid arthritis   Current Dose per office note on 11/30/2023: if inflammatory markers are doing well can try tapering down to 5 mg to decrease side effects   Okay to refill Prednisone ?

## 2023-12-31 NOTE — Progress Notes (Signed)
 Sed rate improved to normal but CRP remained high at 36.5.  Based on this I would continue the current prednisone  dose of 10 mg daily along with leflunomide  for now.

## 2023-12-31 NOTE — Telephone Encounter (Signed)
 Please review and sign as patient is needing a refill since he will be continuing the prednisone .

## 2024-01-14 ENCOUNTER — Telehealth: Payer: Self-pay | Admitting: Internal Medicine

## 2024-01-14 DIAGNOSIS — Z79899 Other long term (current) drug therapy: Secondary | ICD-10-CM

## 2024-01-14 DIAGNOSIS — R609 Edema, unspecified: Secondary | ICD-10-CM

## 2024-01-14 NOTE — Telephone Encounter (Signed)
 Pt c/o swelling/edema: STAT if pt has developed SOB within 24 hours  If swelling, where is the swelling located? Both ankles and feet   How much weight have you gained and in what time span? Pt not sure   Have you gained 2 pounds in a day or 5 pounds in a week? Pt not sure   Do you have a log of your daily weights (if so, list)? No  Are you currently taking a fluid pill? No   Are you currently SOB? No   Have you traveled recently in a car or plane for an extended period of time? No   Pt also complains of a burning sensation with the swelling. Please advise.

## 2024-01-14 NOTE — Telephone Encounter (Signed)
 Spoke with pt. Pt states he has swelling at night after he gets home from work. The swelling goes down after putting his feet up but the tingling. It is even in both feet and denies SOB or pain. Pt stated he does not wear his compression socks everyday nor does he take he lasix . Advised to watch salt intake, wear compression socks and that his lasix  in the morning if he is swollen. Gave 911/ED precautions in case of throbbing pain or uneven swelling. Pt stated understanding. Will forward to Dr Okey and RN to review if further recommendations are needed. Pt stated understanding.

## 2024-01-15 NOTE — Telephone Encounter (Signed)
 Regarding swelling  Have patient check CMET, TSH and BNP I would recomm trial of support hose (can get some at [facility] or her in [place]-- [place] [facility])      He has varicosities    IT may be venous insufficiency   If labs not remarkable would refer to vascular clinic for mapping of veins WIth rheumatologic problems may be due to that as well as meds he's on  (prednisone )

## 2024-01-15 NOTE — Telephone Encounter (Signed)
Attempted to contact patient, no answer and voicemail full.

## 2024-01-24 NOTE — Telephone Encounter (Signed)
 Spoke with patient and shared Dr. Okey' recommendations:  Regarding swelling  Have patient check CMET, TSH and BNP I would recomm trial of support hose (can get some at [facility] or her in [place]-- [place] [facility])      He has varicosities    IT may be venous insufficiency   If labs not remarkable would refer to vascular clinic for mapping of veins WIth rheumatologic problems may be due to that as well as meds he's on  (prednisone )   Patient states he does still have swelling intermittently. He has been wearing compression hose and finds they do help a great deal.  Patient will go to [facility] to have labs drawn tomorrow morning. Patient verbalized understanding of the above and expressed appreciation for call.

## 2024-01-25 DIAGNOSIS — E538 Deficiency of other specified B group vitamins: Secondary | ICD-10-CM | POA: Diagnosis not present

## 2024-01-25 NOTE — Progress Notes (Signed)
 " Cardiology Office Note   Date:  01/31/2024  ID:  Peter, Mcmillan [birth year], MRN [id number] PCP: Alben Therisa MATSU, PA  [facility] Providers Cardiologist:  Vina Gull, MD Electrophysiologist:  OLE ONEIDA HOLTS, MD     History of Present Illness [name] is a 68 y.o. male with a past medical history of HFmrEF, atrial fibrillation/flutter, hypertension, seropositive RA, erectile dysfunction, dyslipidemia, tobacco abuse  03/17/2023 echo EF 55 to 60%, grade 1 DD, mild MR 02/27/2023 monitor  ??  Results 12/28/2020 DCCV 12/17/2020 atrial fibrillation ablation 11/14/2020 DCCV 11/02/2020 coronary CTA calcium  score of 0 11/01/2020 DCCV 10/06/2020 TEE DCCV EF 40 to 45%,  global hypokinesis, mild MR  He is a longstanding patient of [facility] initially establishing with Dr. Verlin in 2015 for the evaluation and management of atrial flutter, he had apparently recently been admitted to [facility] for weakness and shortness of breath, noted to be in atrial flutter with a rate of around 160 bpm, treated with metoprolol  and IV Cardizem .   He was then evaluated by Dr. Waddell in 2017 he had required DCCV which was successful.   In 2022 he required another cardioversion, he had had episodes of atrial fibrillation as well, and ultimately underwent atrial fibrillation ablation on 12/17/2020 >> recurrence of atrial fibrillation approximately 2 weeks later requiring another cardioversion on 12/28/2020, he was ultimately hospitalized and loaded with dofetilide .  Most recently was evaluated by Quita Kicks, PA on 08/10/2023, he was maintaining sinus rhythm, no changes were made to medications or plan of care and he was ultimately advised to follow-up in 6 months.  Most recently he contacted our office with concerns of pedal edema, and burning sensation in his feet, his Lasix  was increased. He presents today for follow up, pedal edema is much improved, he is wearing compression socks.  Continues to be bothered by numbness and pricking sensation to his feet bilaterally, particularly at night while he is trying to rest. He has been referred to [facility] for this. He denies chest pain, palpitations, dyspnea, pnd, orthopnea, n, v, dizziness, syncope, edema, weight gain, or early satiety.   ROS: Review of Systems  Cardiovascular:  Positive for leg swelling (recently but none since lasix  increased).  Neurological:  Positive for sensory change (bilateral feet).  Endo/Heme/Allergies:  Bruises/bleeds easily.  All other systems reviewed and are negative.    Studies Reviewed EKG Interpretation Date/Time:  Thursday January 31 2024 08:13:30 EDT Ventricular Rate:  96 PR Interval:  138 QRS Duration:  76 QT Interval:  368 QTC Calculation: 464 R Axis:   -4  Text Interpretation: Sinus rhythm with Premature atrial complexes When compared with ECG of 18-Sep-2023 16:21, PREVIOUS ECG IS PRESENT Confirmed by Carlin Nest ([id number]) on 01/31/2024 8:16:38 AM    Cardiac Studies & Procedures   ______________________________________________________________________________________________     ECHOCARDIOGRAM  ECHOCARDIOGRAM COMPLETE 03/17/2023  Narrative ECHOCARDIOGRAM REPORT    Patient Name:   Peter Mcmillan Date of Exam: 03/17/2023 Medical Rec #:  [id number]              Height:       77.0 in Accession #:    [id number]             Weight:       214.2 lb Date of Birth:  06-18-1955              BSA:          2.303 m Patient Age:  67 years               BP:           103/66 mmHg Patient Gender: M                      HR:           83 bpm. Exam Location:  Inpatient  Procedure: 2D Echo, Color Doppler and Cardiac Doppler  Indications:    Stroke  History:        Patient has prior history of Echocardiogram examinations, most recent 12/04/2013. CHF, Arrythmias:Atrial Flutter; Risk Factors:Dyslipidemia and Hypertension.  Sonographer:    Logan Shove RDCS Referring Phys: [id number]  CASSONDRA DEL DAVIS   Sonographer Comments: Image acquisition challenging due to patient body habitus. IMPRESSIONS   1. Left ventricular ejection fraction, by estimation, is 55 to 60%. The left ventricle has normal function. The left ventricle has no regional wall motion abnormalities. Left ventricular diastolic parameters are consistent with Grade I diastolic dysfunction (impaired relaxation). 2. Right ventricular systolic function is normal. The right ventricular size is normal. 3. The mitral valve is normal in structure. Mild mitral valve regurgitation. No evidence of mitral stenosis. 4. The aortic valve is normal in structure. Aortic valve regurgitation is not visualized. No aortic stenosis is present. 5. The inferior vena cava is normal in size with greater than 50% respiratory variability, suggesting right atrial pressure of 3 mmHg.  FINDINGS Left Ventricle: Left ventricular ejection fraction, by estimation, is 55 to 60%. The left ventricle has normal function. The left ventricle has no regional wall motion abnormalities. The left ventricular internal cavity size was normal in size. There is no left ventricular hypertrophy. Left ventricular diastolic parameters are consistent with Grade I diastolic dysfunction (impaired relaxation).  Right Ventricle: The right ventricular size is normal. No increase in right ventricular wall thickness. Right ventricular systolic function is normal.  Left Atrium: Left atrial size was normal in size.  Right Atrium: Right atrial size was normal in size.  Pericardium: There is no evidence of pericardial effusion.  Mitral Valve: The mitral valve is normal in structure. Mild mitral valve regurgitation. No evidence of mitral valve stenosis.  Tricuspid Valve: The tricuspid valve is normal in structure. Tricuspid valve regurgitation is mild . No evidence of tricuspid stenosis.  Aortic Valve: The aortic valve is normal in structure. Aortic valve regurgitation  is not visualized. No aortic stenosis is present. Aortic valve mean gradient measures 3.0 mmHg. Aortic valve peak gradient measures 4.9 mmHg. Aortic valve area, by VTI measures 2.74 cm.  Pulmonic Valve: The pulmonic valve was normal in structure. Pulmonic valve regurgitation is not visualized. No evidence of pulmonic stenosis.  Aorta: The aortic root is normal in size and structure.  Venous: The inferior vena cava is normal in size with greater than 50% respiratory variability, suggesting right atrial pressure of 3 mmHg.  IAS/Shunts: No atrial level shunt detected by color flow Doppler.   LEFT VENTRICLE PLAX 2D LVIDd:         4.70 cm      Diastology LVIDs:         2.80 cm      LV e' medial:    5.22 cm/s LV PW:         1.00 cm      LV E/e' medial:  10.2 LV IVS:        1.00 cm      LV e' lateral:  11.20 cm/s LVOT diam:     2.10 cm      LV E/e' lateral: 4.8 LV SV:         50 LV SV Index:   22 LVOT Area:     3.46 cm  LV Volumes (MOD) LV vol d, MOD A2C: 97.5 ml LV vol d, MOD A4C: 114.0 ml LV vol s, MOD A2C: 40.4 ml LV vol s, MOD A4C: 49.9 ml LV SV MOD A2C:     57.1 ml LV SV MOD A4C:     114.0 ml LV SV MOD BP:      64.8 ml  RIGHT VENTRICLE             IVC RV Basal diam:  4.40 cm     IVC diam: 1.00 cm RV S prime:     15.10 cm/s TAPSE (M-mode): 2.0 cm  LEFT ATRIUM             Index        RIGHT ATRIUM           Index LA diam:        4.00 cm 1.74 cm/m   RA Area:     20.80 cm LA Vol (A2C):   57.1 ml 24.79 ml/m  RA Volume:   61.30 ml  26.62 ml/m LA Vol (A4C):   44.5 ml 19.32 ml/m LA Biplane Vol: 50.4 ml 21.88 ml/m AORTIC VALVE AV Area (Vmax):    2.80 cm AV Area (Vmean):   2.60 cm AV Area (VTI):     2.74 cm AV Vmax:           111.00 cm/s AV Vmean:          76.450 cm/s AV VTI:            0.184 m AV Peak Grad:      4.9 mmHg AV Mean Grad:      3.0 mmHg LVOT Vmax:         89.60 cm/s LVOT Vmean:        57.400 cm/s LVOT VTI:          0.146 m LVOT/AV VTI ratio:  0.79  AORTA Ao Root diam: 3.10 cm Ao Asc diam:  3.40 cm  MITRAL VALVE               TRICUSPID VALVE MV Area (PHT): 4.06 cm    TR Peak grad:   19.4 mmHg MV Decel Time: 187 msec    TR Vmax:        220.00 cm/s MV E velocity: 53.50 cm/s MV A velocity: 71.80 cm/s  SHUNTS MV E/A ratio:  0.75        Systemic VTI:  0.15 m Systemic Diam: 2.10 cm  Jerel Balding MD Electronically signed by Jerel Balding MD Signature Date/Time: 03/17/2023/4:51:24 PM    Final   TEE  ECHO TEE 12/17/2020  Narrative TRANSESOPHOGEAL ECHO REPORT    Patient Name:   MADDOXX BURKITT Date of Exam: 12/17/2020 Medical Rec #:  [id number]              Height:       77.0 in Accession #:    [id number]             Weight:       206.0 lb Date of Birth:  09-29-1955              BSA:          2.265 m  Patient Age:    64 years               BP:           147/96 mmHg Patient Gender: M                      HR:           69 bpm. Exam Location:  Inpatient  Procedure: 2D Echo, Cardiac Doppler and Color Doppler  Indications:     Atrial fibrillation I48.91  History:         Patient has prior history of Echocardiogram examinations, most recent 10/06/2020. Risk Factors:Current Smoker. Past history of COVID.  Sonographer:     Annabella Fell RDCS Referring Phys:  [id number] OLE ONEIDA HOLTS Diagnosing Phys: Darryle Decent MD  PROCEDURE: After discussion of the risks and benefits of a TEE, an informed consent was obtained from the patient. The patient was intubated. TEE procedure time was 23 minutes. The transesophogeal probe was passed without difficulty through the esophogus of the patient. Imaged were obtained with the patient in a supine position. Sedation performed by different physician. The patient was monitored while under deep sedation. Anesthestetic sedation was provided intravenously by Anesthesiology: 200mg  of Propofol , 60mg  of Lidocaine . Image quality was excellent. The patient's vital signs; including heart  rate, blood pressure, and oxygen saturation; remained stable throughout the procedure. The patient developed no complications during the procedure.  IMPRESSIONS   1. Pre Ablation TEE. No LAA thrombus present. 2. Left ventricular ejection fraction, by estimation, is 60 to 65%. The left ventricle has normal function. The left ventricle has no regional wall motion abnormalities. 3. Right ventricular systolic function is normal. The right ventricular size is normal. 4. No left atrial/left atrial appendage thrombus was detected. 5. The mitral valve is grossly normal. Trivial mitral valve regurgitation. No evidence of mitral stenosis. 6. The aortic valve is tricuspid. Aortic valve regurgitation is not visualized. No aortic stenosis is present.  FINDINGS Left Ventricle: Left ventricular ejection fraction, by estimation, is 60 to 65%. The left ventricle has normal function. The left ventricle has no regional wall motion abnormalities. The left ventricular internal cavity size was normal in size.  Right Ventricle: The right ventricular size is normal. No increase in right ventricular wall thickness. Right ventricular systolic function is normal.  Left Atrium: Left atrial size was normal in size. No left atrial/left atrial appendage thrombus was detected.  Right Atrium: Right atrial size was normal in size.  Pericardium: Trivial pericardial effusion is present.  Mitral Valve: The mitral valve is grossly normal. Trivial mitral valve regurgitation. No evidence of mitral valve stenosis.  Tricuspid Valve: The tricuspid valve is grossly normal. Tricuspid valve regurgitation is trivial. No evidence of tricuspid stenosis.  Aortic Valve: The aortic valve is tricuspid. Aortic valve regurgitation is not visualized. No aortic stenosis is present.  Pulmonic Valve: The pulmonic valve was grossly normal. Pulmonic valve regurgitation is not visualized. No evidence of pulmonic stenosis.  Aorta: The aortic root  and ascending aorta are structurally normal, with no evidence of dilitation. There is minimal (Grade I) layered plaque involving the descending aorta.  Venous: The left upper pulmonary vein, left lower pulmonary vein, right lower pulmonary vein and right upper pulmonary vein are normal.  IAS/Shunts: No atrial level shunt detected by color flow Doppler.  Additional Comments: Pre Ablation TEE. No LAA thrombus present.    AORTA Ao Root diam: 3.80 cm Ao Asc diam:  3.20 cm  TRICUSPID VALVE TR Peak grad:   7.7 mmHg TR Vmax:        139.00 cm/s  Darryle Decent MD Electronically signed by Darryle Decent MD Signature Date/Time: 12/17/2020/4:03:01 PM    Final    CT SCANS  CT CORONARY MORPH W/CTA COR W/SCORE 11/02/2020  Addendum 11/02/2020  3:04 PM ADDENDUM REPORT: 11/02/2020 15:02  CLINICAL DATA:  68 year old with atrial flutter, chest pain  EXAM: Cardiac/Coronary  CTA  TECHNIQUE: The patient was scanned on a [employer].  FINDINGS: A 100 kV prospective scan was triggered in the descending thoracic aorta at 111 HU's. Axial non-contrast 3 mm slices were carried out through the heart. The data set was analyzed on a dedicated work station and scored using the Agatson method. Gantry rotation speed was 250 msecs and collimation was .6 mm. Beta blockade and 0.8 mg of sl NTG was given. The 3D data set was reconstructed in 5% intervals of the 67-82 % of the R-R cycle. Diastolic phases were analyzed on a dedicated work station using MPR, MIP and VRT modes. The patient received 80 cc of contrast.  There is misregistration artifact (affects a small portion of circumflex read).  Aorta:  Normal size.  No calcifications.  No dissection.  Aortic Valve: No calcifications.  Coronary Arteries:  Normal coronary origin.  Right dominance.  RCA is a large dominant artery that gives rise to PDA and PLA. There is no plaque.  Left main is a large artery that gives rise to LAD and LCX  arteries.  LAD is a large vessel that has no plaque.  LCX is a non-dominant artery that gives rise to two large OM branches. There is no plaque. (A portion of the proximal to mid vessel is not interpretable due to misregistration artifact).  Other findings:  Normal pulmonary vein drainage into the left atrium.  RUPV: 19.2 x 15.8 mm, area 2.2 cm2  RLPV: 23.7 x 21.0 mm, area 3.82 cm2  LUPV: 20.2 x 15.6 mm, area 2.46 cm2  LLPV: 20.4 x 16.8 mm, area 2.71 cm2  Can not exclude left atrial appendage thrombus. There are no delayed acquisitions to confirm absence of clot.  Normal size of the pulmonary artery.  Please see radiology report for non cardiac findings.  IMPRESSION: 1. Coronary calcium  score of 0. This was 0 percentile for age and sex matched control.  2. Normal coronary origin with right dominance.  3. No evidence of CAD. (A small portion of the proximal to mid circumflex is not interpretable due to misregistration artifact).  4. Can not exclude left atrial appendage thrombus. There are no delayed acquisitions to confirm absence of clot. Consider repeat dedicated pulmonary vein protocol study or TEE.   Electronically Signed By: Oneil Parchment MD On: 11/02/2020 15:02  Addendum 11/02/2020  2:23 PM ADDENDUM REPORT: 11/02/2020 14:20  ADDENDUM: These results were called by telephone at the time of interpretation on 11/02/2020 at 2:20 pm to provider RENEE URSUY , who verbally acknowledged these results.   Electronically Signed By: Isla Blind M.D. On: 11/02/2020 14:20  Narrative EXAM: OVER-READ INTERPRETATION  CT CHEST  The following report is an over-read performed by radiologist Dr. Isla Blind of [facility] on 11/02/2020. This over-read does not include interpretation of cardiac or coronary anatomy or pathology. The coronary calcium  score/coronary CTA interpretation by the cardiologist is attached.  COMPARISON:  Comparison made with Aug 19, 2008.  FINDINGS: Vascular: Please see dedicated cardiac CT  report and coronary artery calcium  score provided by the cardiologist.  Subtle area of low attenuation in segmental branch to the RIGHT lower lobe favored to represent artifact but in an area subtending airspace disease at the periphery of the RIGHT lung base that is partially visualized.  Mediastinum/Nodes: Esophagus grossly normal. Scattered small lymph nodes throughout the visible chest without signs of adenopathy.  Lungs/Pleura: Small effusions at the lung bases. Patchy ground-glass. Bronchial wall thickening. Partially visualized airspace disease at the RIGHT lung base.  Upper Abdomen: Hepatic steatosis no acute upper abdominal process.  Musculoskeletal: No acute bone finding or destructive bone lesion.  IMPRESSION: 1. Subtle area of low attenuation segmental RIGHT lower lobe pulmonary arterial branch already very poorly opacified, potentially artifact, difficult to exclude small embolism particularly given appearance on image 96 of series 602. Given that this supplies an of peripheral airspace disease CT angiography of the chest may be helpful to exclude pulmonary embolism and resultant infarct. 2. Airspace disease at the RIGHT lung base at the periphery partially imaged in the setting of bronchial wall thickening may reflect pneumonitis or even atelectasis. Small pulmonary infarct is considered as well given above findings. 3. Mild bronchial wall thickening as above raising the question of background inflammatory changes. 4. Small pleural effusions. 5. Hepatic steatosis.  A call is out to the referring provider  To further discuss findings in the above case.  Electronically Signed: By: Isla Blind M.D. On: 11/02/2020 13:49     ______________________________________________________________________________________________      Risk Assessment/Calculations  CHA2DS2-VASc Score = 2   This  indicates a 2.2% annual risk of stroke. The patient's score is based upon: CHF History: 1 HTN History: 0 Diabetes History: 0 Stroke History: 0 Vascular Disease History: 0 Age Score: 1 Gender Score: 0            Physical Exam VS:  BP 130/82   Pulse 96   Ht 6' 5 (1.956 m)   Wt 219 lb 12.8 oz (99.7 kg)   SpO2 98%   BMI 26.06 kg/m        Wt Readings from Last 3 Encounters:  01/31/24 219 lb 12.8 oz (99.7 kg)  11/30/23 210 lb (95.3 kg)  11/06/23 217 lb 9.6 oz (98.7 kg)    GEN: Well nourished, well developed in no acute distress NECK: No JVD; No carotid bruits CARDIAC: RRR, no murmurs, rubs, gallops RESPIRATORY:  Clear to auscultation without rales, wheezing or rhonchi  ABDOMEN: Soft, non-tender, non-distended EXTREMITIES:  No edema; No deformity   ASSESSMENT AND PLAN PAF/hypercoagulable state/on high risk medication use - CV score of 2, maintaining sinus rhythm, he is keenly aware when he is out of rhythm.  Continue Eliquis  5 mg twice daily--no indication for dose reduction, will repeat c-Met and CBC but he denies hematochezia, hematuria, hemoptysis.  Continue Tikosyn  250 mg twice daily--will repeat magnesium  level as well as BMET, his QTc remains normal.  HFimpEF-NYHA class I, euvolemic, echo 12/24 EF 55 to 60%, grade 1 DD, mild MR. Continue Entresto  24-26 mg twice daily, metoprolol  50 mg twice daily, Lasix  as directed currently 40 mg every other day along with potassium supplementation.  Pedal edema-recent adjustments made to his diuretic and he has no pedal edema today, he is wearing compression socks, he has been referred to [facility] for venous insufficiency. Repeat BMET, proBNP already arranged after diuretic titration and he will come back next week for those labs. Echo within the last year EF 55 to 60%, grade 1  DD, mild MR. Do not think we need to repeat at this time.  Hypertension-blood pressure 130/82, continue Entresto  24 to 26 mg daily, metoprolol  50 mg twice daily.           Dispo: Magnesium  level, keep follow-up with Dr. Okey in 3 months.  Signed, Delon JAYSON Hoover, NP  "

## 2024-01-26 LAB — COMPREHENSIVE METABOLIC PANEL WITH GFR
ALT: 18 IU/L (ref 0–44)
AST: 17 IU/L (ref 0–40)
Albumin: 3.6 g/dL — ABNORMAL LOW (ref 3.9–4.9)
Alkaline Phosphatase: 57 IU/L (ref 47–123)
BUN/Creatinine Ratio: 12 (ref 10–24)
BUN: 10 mg/dL (ref 8–27)
Bilirubin Total: 0.4 mg/dL (ref 0.0–1.2)
CO2: 23 mmol/L (ref 20–29)
Calcium: 9.3 mg/dL (ref 8.6–10.2)
Chloride: 105 mmol/L (ref 96–106)
Creatinine, Ser: 0.83 mg/dL (ref 0.76–1.27)
Globulin, Total: 2.5 g/dL (ref 1.5–4.5)
Glucose: 108 mg/dL — ABNORMAL HIGH (ref 70–99)
Potassium: 4.6 mmol/L (ref 3.5–5.2)
Sodium: 140 mmol/L (ref 134–144)
Total Protein: 6.1 g/dL (ref 6.0–8.5)
eGFR: 95 mL/min/1.73 (ref >59)

## 2024-01-26 LAB — TSH: TSH: 0.971 u[IU]/mL (ref 0.450–4.500)

## 2024-01-26 LAB — PRO B NATRIURETIC PEPTIDE: NT-Pro BNP: 612 pg/mL — ABNORMAL HIGH (ref 0–376)

## 2024-01-28 ENCOUNTER — Ambulatory Visit: Payer: Self-pay | Admitting: Internal Medicine

## 2024-01-28 DIAGNOSIS — I5032 Chronic diastolic (congestive) heart failure: Secondary | ICD-10-CM

## 2024-01-28 DIAGNOSIS — Z79899 Other long term (current) drug therapy: Secondary | ICD-10-CM

## 2024-01-29 ENCOUNTER — Other Ambulatory Visit: Payer: Self-pay

## 2024-01-29 DIAGNOSIS — R609 Edema, unspecified: Secondary | ICD-10-CM

## 2024-01-29 MED ORDER — FUROSEMIDE 20 MG PO TABS: 40.0000 mg | ORAL_TABLET | ORAL | Status: AC

## 2024-01-29 MED ORDER — POTASSIUM CHLORIDE CRYS ER 20 MEQ PO TBCR: 20.0000 meq | EXTENDED_RELEASE_TABLET | ORAL | Status: AC

## 2024-01-31 ENCOUNTER — Ambulatory Visit: Attending: Internal Medicine | Admitting: Cardiology

## 2024-01-31 ENCOUNTER — Encounter: Payer: Self-pay | Admitting: Cardiology

## 2024-01-31 VITALS — BP 130/82 | HR 96 | Ht 77.0 in | Wt 219.8 lb

## 2024-01-31 DIAGNOSIS — I4819 Other persistent atrial fibrillation: Secondary | ICD-10-CM | POA: Diagnosis not present

## 2024-01-31 DIAGNOSIS — R600 Localized edema: Secondary | ICD-10-CM

## 2024-01-31 DIAGNOSIS — I5020 Unspecified systolic (congestive) heart failure: Secondary | ICD-10-CM | POA: Diagnosis not present

## 2024-01-31 DIAGNOSIS — D6859 Other primary thrombophilia: Secondary | ICD-10-CM | POA: Diagnosis not present

## 2024-01-31 DIAGNOSIS — Z79899 Other long term (current) drug therapy: Secondary | ICD-10-CM | POA: Diagnosis not present

## 2024-01-31 NOTE — Patient Instructions (Signed)
 Thank you for choosing [facility]!     Medication Instructions:  No medication changes were made during today's visit.  *If you need a refill on your cardiac medications before your next appointment, please call your pharmacy*   Lab Work: No labs were ordered during today's visit.  If you have labs (blood work) drawn today and your tests are completely normal, you will receive your results only by: MyChart Message (if you have MyChart) OR A paper copy in the mail If you have any lab test that is abnormal or we need to change your treatment, we will call you to review the results.   Testing/Procedures: No procedures were ordered during today's visit.   Your next appointment:   3 month(s)   Provider:   Leia Gull, MD     Follow-Up: At [facility], you and your health needs are our priority.  As part of our continuing mission to provide you with exceptional heart care, we have created designated Provider Care Teams.  These Care Teams include your primary Cardiologist (physician) and Advanced Practice Providers (APPs -  Physician Assistants and Nurse Practitioners) who all work together to provide you with the care you need, when you need it. We recommend signing up for the patient portal called MyChart.  Sign up information is provided on this After Visit Summary.  MyChart is used to connect with patients for Virtual Visits (Telemedicine).  Patients are able to view lab/test results, encounter notes, upcoming appointments, etc.  Non-urgent messages can be sent to your provider as well.   To learn more about what you can do with MyChart, go to [email or web address].

## 2024-02-06 ENCOUNTER — Ambulatory Visit (HOSPITAL_COMMUNITY): Admitting: Physician Assistant

## 2024-02-06 DIAGNOSIS — Z79899 Other long term (current) drug therapy: Secondary | ICD-10-CM | POA: Diagnosis not present

## 2024-02-07 LAB — MAGNESIUM: Magnesium: 2.1 mg/dL (ref 1.6–2.3)

## 2024-02-14 ENCOUNTER — Other Ambulatory Visit: Payer: Self-pay | Admitting: Internal Medicine

## 2024-02-14 DIAGNOSIS — M059 Rheumatoid arthritis with rheumatoid factor, unspecified: Secondary | ICD-10-CM

## 2024-02-14 NOTE — Telephone Encounter (Signed)
 Last Fill: 11/19/2023  Labs: 01/25/2024-CMP Glucose 108 Albumin 3.6 11/30/2023-CBC  Neutro Abs 8,210 Absolute Lymphocytes 790  Next Visit: 03/07/2024  Last Visit: 11/30/2023  DX:  Seropositive rheumatoid arthritis   Current Dose per office note 11/30/2023: leflunomide  20 mg daily.   Okay to refill Arava  ?

## 2024-02-25 NOTE — Progress Notes (Signed)
 Office Visit Note  Patient: Peter Mcmillan             Date of Birth: May 27, 1955           MRN: [id number]             PCP: Alben Therisa MATSU, PA Referring: Alben Therisa MATSU, [place] Visit Date: 03/07/2024   Subjective:  Medical Management of Chronic Issues (Numbness and tingling in the feet as well as some busted blood vessels for about 2-3 weeks now )   Discussed the use of AI scribe software for clinical note transcription with the patient, who gave verbal consent to proceed.  History of Present Illness   Peter Mcmillan is a 68 y.o. male here for follow up on seropositive rheumatoid arthritis on leflunomide  20 mg daily prednisone  10 mg daily and after left knee steroid injection on August 5.  Over the past two weeks, he has experienced new numbness, primarily affecting his feet. The symptoms include numbness, tingling, and burning sensations, which have progressed to involve the back of his legs. Initially, the numbness started in his left foot and has now affected both feet, with associated swelling and pain upon bending his toes. He reports a lack of feeling in his feet and states that they feel cold to him.  In addition to the neuropathy, he experiences pain and tenderness in his hands, with the right hand being more affected than the left. He notes bruising and difficulty healing from scratches, with bruises turning pink after healing. There is tenderness and swelling in his knuckles and a tender nodule on his fourth finger, which is painful when gripping objects.  He denies any recent illness or new back pain. He experiences occasional cramps at night, which he manages with a spoonful of mustard, though he is unsure of its effectiveness.  His current medications include 10 mg of prednisone  and leflunomide  taken once daily. He is concerned about the side effects of prednisone , including swelling, bruising, and delayed healing.    Previous HPI 11/30/2023 Peter Scicchitano  Mcmillan is a 68 year old male here for follow-up on seropositive rheumatoid arthritis on leflunomide  20 mg daily prednisone  10 mg daily and after left knee steroid injection on August 5.   He was last seen on August 5th for a left knee steroid injection due to a flare-up of pain and swelling that had lasted about two weeks. Since the injection, the knee has improved, although he still experiences 'good days and bad days.' He has been wearing orthopedic socks during the day at work and has changed his boots, which he believes has helped.   His hands have been less problematic than before, but he still experiences some issues. His right wrist sometimes swells and his fingers sometimes lock up, although he does not currently feel much swelling in the fingers. He describes a 'trigger finger' that gets stuck bent sometimes, and he can feel a nodule slipping past his thumb.   He reports that since starting leflunomide  20 mg daily, he has been urinating more frequently. He has also been drinking more water to help flush his system.   Two weeks ago, he experienced a brief illness where he woke up not feeling well. He was tested and initially tested positive for COVID-19, but subsequent tests on the following days were negative. He did not experience fever, nausea, or other significant symptoms, and he believes it may have been more of a sinus infection or allergies. His  wife also tested negative for COVID-19 during this period. No fever, nausea, or significant illness symptoms during his recent health concern.         Previous HPI 09/27/2023 Peter Mcmillan is a 68 year old male here for follow-up of seropositive rheumatoid arthritis with ongoing joint swelling and erosive radiographic changes currently on prednisone  and Humira .   He is currently on prednisone  increased Azdone direction to 20 mg for flare previously on 10 mg daily for the past few months. He has recently restarted Humira , having  taken two doses, with the last dose being two weeks ago. He is concerned about the cost of Humira .  Also concerned because he felt the some kind of inflammation or swelling in the body got worse with resuming the Humira  and to have quite considerable pedal edema both self-reported and documented at recent emergency department visit.    Due to his symptoms, he has not been working for the past two weeks and has used up his vacation days. He has difficulty getting out of bed and missed a planned vacation. Both knees are tender, with the left being worse, and he reports numbness in his feet, which he attributes to not putting enough pressure on them while walking.   He reports a change in his eating habits and mental state over the past month, which has been a difficult period for him. He has lost some weight and attributes this to the stress and changes in his routine.    He has tried the tramadol  and gabapentin  for joint pains feels he does not tolerate these medicines well that they cause a significant mental side effects.   Previous HPI 08/30/23 Peter Mcmillan is a 68 y.o. male here for follow up for follow up for seropositive RA.   He previously used Humira  40 mg [place] q14days, which was effective, but discontinued it due to cost issues, as it is classified as a tier six medication by his insurance. He has not continued this since March, He previously has tried methotrexate , which was not effective for him.   He was on prednisone  5 mg daily not controlled symptoms well. He has been on a prednisone  taper (6, 5, 4, 3, 2, 1) with the last 10 mg dose taken this morning. Despite this, his knee remains swollen, though there has been slight improvement. He notes that his knee 'bubbles' when walking, and he has difficulty standing up after sitting.   He has significant issues with his knees and feet, including tingling and numbness in the bottom of his feet around the toes, affecting both feet. His  knee is swollen and painful, with a granular cyst identified by an orthopedic specialist last week. Attempts to drain the cyst were unsuccessful due to its hardened state.   He finds it difficult to function and has been unable to work for two weeks, stating, 'I couldn't even get out of bed.'      Previous HPI 05/25/2023 Candace Ramus Torry is a 68 y.o. male here for follow up for seropositive RA on Humira  40 mg subcu q. 14 days.  He has now taken about 4 doses in total and does feel like symptoms are getting better compared to our initial follow-up only 1 month into treatment.  Seeing a partial improvement in pain and swelling affecting both hands.  He is also discontinued the prednisone  completely again without a severe exacerbation of symptoms.     Previous HPI 04/23/2023 Itay Mella Kitchings is a  68 y.o. male here for follow up for follow up for seropositive RA. He presents with persistent joint pain and discomfort. He reports having taken four doses of Humira  since the end of December, with another dose scheduled for the following week. Prior to this, he was on a daily regimen of prednisone  10mg , which he discontinued three weeks ago due to concerns about weight gain and potential cardiovascular risks.   Since discontinuing prednisone , the patient reports worsening arthritis symptoms, particularly in his hands and knees. He describes the pain as constant and sometimes so severe that it impedes his ability to perform daily tasks, including work. He also reports coldness in his feet, particularly at night, which he manages by wearing socks or heating a towel.   The patient expresses a strong desire to avoid opioid pain medications due to personal experiences with addiction in his family. He recalls a previous positive experience with gabapentin  for pain management following an ankle injury and expresses interest in trying it again for his current symptoms.   The patient also mentions a  recent hospital visit and upcoming colonoscopy, scheduled due to a recent episode of gastrointestinal bleeding.     Previous HPI 02/21/2023 Kaylan Friedmann Harig is a 68 y.o. male here for follow up for seropositive RA currently he is off medications.  He was taking methotrexate  25 mg p.o. weekly but stopped taking this medicine for several weeks because he had not seen any noticeable benefit despite a long treatment course.  He is also off of prednisone  since 2 weeks after completing the last prescription.  He is now having a flareup with joint pain stiffness and swelling in multiple areas started since Sunday 3 days ago.  He is also concerned because of most severe pain is in his feet and with extensive swelling throughout the feet and in his shins which has previously been a symptom when he was in A-fib with RVR. He discussed with his dermatologist about possibly starting biologic and after detailed skin exam and his history of melanoma they felt this was not currently contraindicated.   11/22/22 Tieler Cournoyer Benning is a 68 y.o. male here for follow up for seropositive RA on methotrexate  25 mg p.o. weekly folic acid  1 mg daily and prednisone  10 mg daily unfortunately still having significant joint pain and stiffness on a daily basis.  Currently his biggest complaint is with the recurrence of elbow pain and swelling with a very large lump especially on the left side.  This is bothersome because it is crating a pressure area and hitting against things as well as pain in the joint and difficulty fully extending his elbow.   08/21/2022 Schneur Crowson Aiello is a 68 y.o. male here for follow-up for seropositive RA after resuming methotrexate  25 mg p.o. weekly folic acid  1 mg daily and initial treatment with prednisone  taper starting from 20 mg over 12 days.  Initially had benefit while taking the prednisone  since stopping it he immediately saw an increase in joint pain affecting bilateral hands and feet  again has also developed swelling overlying the elbows and on anterior of both knees.  Very prolonged stiffness on a daily basis beginning after waking lasting until around 3 to 4 PM.   07/24/22 Aldwin Micalizzi is a 68 y.o. male here for follow up for seropositive RA previously on methotrexate  but has been out of the medication for about 2 months was taking 25 mg p.o. weekly he still on the folic acid  1 mg  daily.  He has been experiencing increased joint pain and swelling in his hands shoulders and knees more severe on the right side than left.  Did not recall any other illness or injury prior to symptom worsening.  We spoke by phone 2 days ago and started on prednisone  taper he is on 20 mg today is day 3 so far seeing a partial improvement joint pain and swelling is decreased but still has prolonged morning stiffness and still having some swelling in that right hand and shoulder.  He had recent cardiology follow-up everything is doing well and not in A-fib.   09/16/21 Therron Sells is a 68 y.o. male here for follow up for seropositive RA on methotrexate  15 mg PO weekly.  Hand pain is pretty stable without any major flareup or exacerbation.  His current complaint is due to right elbow swelling.  He developed painful bursitis this was evaluated at urgent care clinic last month and again about a week ago.  Not particularly hot or inflamed but is painful and just remains swollen.  He does not recall any trauma preceding the onset of the swelling.   06/13/2021 Nakai Pollio Kem is a 68 y.o. male here for follow up for seropositive RA on methotrexate  15 mg PO weekly after last visit trying adding turmeric and continuing off the orencia . He missed one does of methotrexate  due to running out of his medication and now has increased symptoms with bilateral hand pain and swelling. Right hand is worst affected and difficult to use normally. He also has a new cough ongoing for the past month it is not  productive. Cough is most common when talking, when sitting and eating, or after he lies down in bed to sleep. He is not yet taking any seasonal allergy medication for the year. He is not taking any cough medicines.   05/04/21 Bradie Lacock Buffone is a 69 y.o. male here for follow up for seropositive RA on methotrexate  15 mg p.o. weekly.  He stopped taking the Orencia  injections for about a month ever since he was hospitalized for his A. fib.  He feels there was not much difference when he took the shots and has not noticed much difference since stopping them.  He continues having some joint pain stiffness and swelling mostly affecting his hands and knees.  He is curious about any other anti-inflammatory medicine or supplements to try in addition to his RA medicines.   Previous HPI 09/23/20 Bowman Higbie is a 68 y.o. male here for follow-up of seropositive rheumatoid arthritis on methotrexate  15 mg p.o. weekly and prednisone  20 mg daily.  He feels his symptoms have improved taking the medications although worsened greatly when he decrease his prednisone  down to 10 mg as directed and so went back to taking 20 daily.  He has been out of the methotrexate  since about 2 weeks ago.     DMARD Hx Humira  2024 Not affordable Orencia  Primary nonresponder Methotrexate  2023-2024 not effective   Review of Systems  Constitutional:  Positive for fatigue.  HENT:  Negative for mouth sores and mouth dryness.   Eyes:  Negative for dryness.  Respiratory:  Negative for shortness of breath.   Cardiovascular:  Negative for chest pain and palpitations.  Gastrointestinal:  Positive for constipation and diarrhea. Negative for blood in stool.  Endocrine: Positive for increased urination.  Genitourinary:  Negative for involuntary urination.  Musculoskeletal:  Positive for joint pain, gait problem, joint pain, joint swelling, myalgias, muscle weakness, morning  stiffness, muscle tenderness and myalgias.  Skin:   Positive for color change. Negative for rash, hair loss and sensitivity to sunlight.  Allergic/Immunologic: Positive for susceptible to infections.  Neurological:  Negative for dizziness and headaches.  Hematological:  Negative for swollen glands.  Psychiatric/Behavioral:  Positive for sleep disturbance. Negative for depressed mood. The patient is not nervous/anxious.     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   Essential hypertension 06/11/2020   Gastro-esophageal reflux disease without esophagitis 06/11/2020   Hyperlipidemia 06/11/2020   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   Prediabetes 06/11/2020   Tobacco user 06/11/2020   Vitamin D  deficiency 06/11/2020   Bilateral hand pain 06/11/2020   Bilateral shoulder pain 06/11/2020   Atrial flutter (HCC) 12/09/2013   PULMONARY NODULE 08/18/2008   COUGH 08/18/2008    Past Medical History:  Diagnosis Date   Atrial flutter (HCC)    Bursitis of right elbow    COVID 11/12/2023   Erectile dysfunction    Rheumatoid arthritis (HCC)    Tobacco abuse    Smokeless tobacco    Family History  Problem Relation Age of Onset   Heart attack Father    Heart attack Mother    Dementia Mother    Healthy Sister    Healthy Son    Past Surgical History:  Procedure Laterality Date   ATRIAL FIBRILLATION ABLATION N/A 12/17/2020   Procedure: ATRIAL  FIBRILLATION ABLATION;  Surgeon: Cindie Ole DASEN, MD;  Location: [facility] INVASIVE CV LAB;  Service: Cardiovascular;  Laterality: N/A;   BACK SURGERY  1999   CARDIOVERSION N/A 10/06/2020   Procedure: CARDIOVERSION;  Surgeon: Kate Lonni CROME, MD;  Location[facility];  Service: Cardiovascular;  Laterality: N/A;   COLONOSCOPY     MELANOMA EXCISION  2020   TEE WITHOUT CARDIOVERSION N/A 10/06/2020   Procedure: TRANSESOPHAGEAL ECHOCARDIOGRAM (TEE);  Surgeon: Kate Lonni CROME, MD;  Location: [facility] ENDOSCOPY;  Service: Cardiovascular;  Laterality: N/A;   TEE WITHOUT CARDIOVERSION N/A 12/17/2020   Procedure: TRANSESOPHAGEAL ECHOCARDIOGRAM (TEE);  Surgeon: Barbaraann Darryle Ned, MD;  Location: [facility] INVASIVE CV LAB;  Service: Cardiovascular;  Laterality: N/A;   Social History   Social History Narrative   Not on file   Immunization History  Administered Date(s) Administered   Fluzone Influenza virus vaccine,trivalent (IIV3), split virus 01/29/2017   Hep A / Hep B 02/17/2011   Moderna Sars-Covid-2 Vaccination 07/09/2019, 07/09/2019, 08/06/2019, 08/06/2019   Pneumococcal Polysaccharide-23 02/12/2009   Tdap 02/12/2009, 06/13/2017, 06/18/2020     Objective: Vital Signs: BP 122/72   Pulse (!) 54   Temp 97.6 F (36.4 C)   Resp 16   Ht 6' 5 (1.956 m)   Wt 214 lb (97.1 kg)   BMI 25.38 kg/m    Physical Exam Eyes:     Conjunctiva/sclera: Conjunctivae normal.  Cardiovascular:     Rate and Rhythm: Normal rate and regular rhythm.     Comments: Palpable PT/DP pulses Pulmonary:  Effort: Pulmonary effort is normal.     Breath sounds: Normal breath sounds.  Lymphadenopathy:     Cervical: No cervical adenopathy.  Skin:    General: Skin is warm and dry.     Comments: Bruising on forearm extensor surfaces, scabbed skin tear on right elbow Tortuous superficial veins on b/l feet and ankles  Neurological:     Mental Status: He is alert.  Psychiatric:        Mood and Affect: Mood  normal.      Musculoskeletal Exam:  Shoulders full ROM no tenderness or swelling Elbows full ROM no tenderness or swelling Mild to bilateral wrist swelling with warmth to touch right greater than left, no focal tenderness Right MCP joints chronic thickening, lateral deviation, 2nd-3rd MCPs swelling, MCP and PIP tenderness to pressure, slightly limited in flexion and extension ROM Knees full ROM, no palpable effusions MTP squeeze tenderness, swelling in left forefoot  Investigation: No additional findings.  Imaging: No results found.  Recent Labs: Lab Results  Component Value Date   WBC 10.0 11/30/2023   HGB 13.9 11/30/2023   PLT 318 11/30/2023   NA 140 01/25/2024   K 4.6 01/25/2024   CL 105 01/25/2024   CO2 23 01/25/2024   GLUCOSE 108 (H) 01/25/2024   BUN 10 01/25/2024   CREATININE 0.83 01/25/2024   BILITOT 0.4 01/25/2024   ALKPHOS 57 01/25/2024   AST 17 01/25/2024   ALT 18 01/25/2024   PROT 6.1 01/25/2024   ALBUMIN 3.6 (L) 01/25/2024   CALCIUM  9.3 01/25/2024   GFRAA 111 09/23/2020   QFTBGOLDPLUS NEGATIVE 08/21/2022    Speciality Comments: No specialty comments available.  Procedures:  No procedures performed Allergies: Codeine, Other, Penicillin g, and Penicillins   Assessment / Plan:     Visit Diagnoses: Seropositive rheumatoid arthritis (HCC) - Plan: Sedimentation rate, C-reactive protein Rheumatoid arthritis poorly controlled. Prednisone  causing adverse effects. Leflunomide  dosage maxed. Biologics considered but cost is a concern. He previously had some benefit on Humira  but stopped it due to cost reasons. Medicare may reduce out-of-pocket costs, discussed OOP max and recommended to reconsider given complications of RA and steroid use. - Continue leflunomide  20 mg daily. - Continue prednisone  10 mg daily - Checking sed rate and CRP for disease activity monitoring - Recommended again addition of biologic DMARD adalimumab  or otherwise  High risk medication  use - Plan: CBC with Differential/Platelet, Comprehensive metabolic panel with GFR Prednisone  and leflunomide  causing side effects. Prednisone  leads to swelling, bruising, impaired healing. Leflunomide  may cause neuropathy. Low potassium possible due to medications. - Checking CBC and CMP for medication monitoring on leflunomide  and prednisone   Other polyneuropathy - Plan: B12 and Folate Panel, Lipid panel New bilateral peripheral neuropathy with numbness, tingling, and burning. Possible leflunomide -induced B12 or folate deficiency. Low potassium may exacerbate symptoms. Possible hyperglycemia on persistent steroid use so checking A1c. - Advised on foot care to prevent falls and injuries. - checking B12, folate, A1c for neuropathy causes  Hyperlipidemia, unspecified hyperlipidemia type - Plan: Hemoglobin A1c   Orders: Orders Placed This Encounter  Procedures   Sedimentation rate   C-reactive protein   CBC with Differential/Platelet   Comprehensive metabolic panel with GFR   B12 and Folate Panel   Hemoglobin A1c   Lipid panel   No orders of the defined types were placed in this encounter.    Follow-Up Instructions: Return in about 3 months (around 06/05/2024) for RA on LEF/GC f/u 3mos.   Lonni LELON Ester, MD  Note - This record has been created using [employer].  Chart creation errors have been sought, but may not always  have been located. Such creation errors do not reflect on  the standard of medical care.

## 2024-02-29 ENCOUNTER — Other Ambulatory Visit: Payer: Self-pay | Admitting: Internal Medicine

## 2024-02-29 DIAGNOSIS — M059 Rheumatoid arthritis with rheumatoid factor, unspecified: Secondary | ICD-10-CM

## 2024-03-07 ENCOUNTER — Ambulatory Visit: Attending: Internal Medicine | Admitting: Internal Medicine

## 2024-03-07 ENCOUNTER — Encounter: Payer: Self-pay | Admitting: Internal Medicine

## 2024-03-07 VITALS — BP 122/72 | HR 54 | Temp 97.6°F | Resp 16 | Ht 77.0 in | Wt 214.0 lb

## 2024-03-07 DIAGNOSIS — E785 Hyperlipidemia, unspecified: Secondary | ICD-10-CM | POA: Diagnosis not present

## 2024-03-07 DIAGNOSIS — M059 Rheumatoid arthritis with rheumatoid factor, unspecified: Secondary | ICD-10-CM

## 2024-03-07 DIAGNOSIS — G6289 Other specified polyneuropathies: Secondary | ICD-10-CM

## 2024-03-07 DIAGNOSIS — Z79899 Other long term (current) drug therapy: Secondary | ICD-10-CM

## 2024-03-07 DIAGNOSIS — G629 Polyneuropathy, unspecified: Secondary | ICD-10-CM | POA: Insufficient documentation

## 2024-03-07 NOTE — Patient Instructions (Signed)
 SABRA

## 2024-03-08 LAB — CBC WITH DIFFERENTIAL/PLATELET
Absolute Lymphocytes: 958 {cells}/uL (ref 850–3900)
Absolute Monocytes: 845 {cells}/uL (ref 200–950)
Basophils Absolute: 93 {cells}/uL (ref 0–200)
Basophils Relative: 0.9 %
Eosinophils Absolute: 175 {cells}/uL (ref 15–500)
Eosinophils Relative: 1.7 %
HCT: 42.8 % (ref 39.4–51.1)
Hemoglobin: 13.7 g/dL (ref 13.2–17.1)
MCH: 28.4 pg (ref 27.0–33.0)
MCHC: 32 g/dL (ref 31.6–35.4)
MCV: 88.8 fL (ref 81.4–101.7)
MPV: 11.5 fL (ref 7.5–12.5)
Monocytes Relative: 8.2 %
Neutro Abs: 8230 {cells}/uL — ABNORMAL HIGH (ref 1500–7800)
Neutrophils Relative %: 79.9 %
Platelets: 307 Thousand/uL (ref 140–400)
RBC: 4.82 Million/uL (ref 4.20–5.80)
RDW: 12.6 % (ref 11.0–15.0)
Total Lymphocyte: 9.3 %
WBC: 10.3 Thousand/uL (ref 3.8–10.8)

## 2024-03-08 LAB — HEMOGLOBIN A1C
Hgb A1c MFr Bld: 5.9 % — ABNORMAL HIGH (ref <5.7)
Mean Plasma Glucose: 123 mg/dL
eAG (mmol/L): 6.8 mmol/L

## 2024-03-08 LAB — COMPREHENSIVE METABOLIC PANEL WITH GFR
AG Ratio: 1.3 (calc) (ref 1.0–2.5)
ALT: 19 U/L (ref 9–46)
AST: 18 U/L (ref 10–35)
Albumin: 3.5 g/dL — ABNORMAL LOW (ref 3.6–5.1)
Alkaline phosphatase (APISO): 48 U/L (ref 35–144)
BUN: 14 mg/dL (ref 7–25)
CO2: 21 mmol/L (ref 20–32)
Calcium: 8.8 mg/dL (ref 8.6–10.3)
Chloride: 109 mmol/L (ref 98–110)
Creat: 0.8 mg/dL (ref 0.70–1.35)
Globulin: 2.6 g/dL (ref 1.9–3.7)
Glucose, Bld: 118 mg/dL — ABNORMAL HIGH (ref 65–99)
Potassium: 4.1 mmol/L (ref 3.5–5.3)
Sodium: 141 mmol/L (ref 135–146)
Total Bilirubin: 0.5 mg/dL (ref 0.2–1.2)
Total Protein: 6.1 g/dL (ref 6.1–8.1)
eGFR: 96 mL/min/1.73m2 (ref >=60)

## 2024-03-08 LAB — LIPID PANEL
Cholesterol: 150 mg/dL (ref <200)
HDL: 52 mg/dL (ref >=40)
LDL Cholesterol (Calc): 78 mg/dL
Non-HDL Cholesterol (Calc): 98 mg/dL (ref <130)
Total CHOL/HDL Ratio: 2.9 (calc) (ref <5.0)
Triglycerides: 115 mg/dL (ref <150)

## 2024-03-08 LAB — B12 AND FOLATE PANEL
Folate: 7.7 ng/mL
Vitamin B-12: 532 pg/mL (ref 200–1100)

## 2024-03-08 LAB — C-REACTIVE PROTEIN: CRP: 41.9 mg/L — ABNORMAL HIGH (ref <8.0)

## 2024-03-08 LAB — SEDIMENTATION RATE: Sed Rate: 19 mm/h (ref 0–20)

## 2024-03-10 ENCOUNTER — Telehealth: Payer: Self-pay

## 2024-03-10 NOTE — Telephone Encounter (Signed)
 Patient called regarding recent lab results. Please advise. Thanks!

## 2024-03-13 NOTE — Telephone Encounter (Signed)
 Patient called again regarding labs. Please advise. thanks!

## 2024-03-14 ENCOUNTER — Ambulatory Visit: Payer: Self-pay | Admitting: Internal Medicine

## 2024-03-14 NOTE — Telephone Encounter (Signed)
 Now addressed in associated result note

## 2024-03-14 NOTE — Progress Notes (Signed)
 CRP of 41.9 is quite elevated a little bit higher from 36.53 months ago.  Sedimentation rate is normal. His neutrophil count is high at 8230 this is probably an effect of the prednisone .  Metabolic panel is fine with normal kidney and liver function and no electrolyte problems. Hemoglobin A1c of 5.9% is not bad, technically could be considered in the prediabetic range but he is also on prednisone  that would be increasing it. Folic acid  and B12 are good.  His cholesterol levels are also good.

## 2024-03-25 ENCOUNTER — Other Ambulatory Visit: Payer: Self-pay | Admitting: Internal Medicine

## 2024-03-25 DIAGNOSIS — M059 Rheumatoid arthritis with rheumatoid factor, unspecified: Secondary | ICD-10-CM

## 2024-03-25 NOTE — Telephone Encounter (Signed)
 Last Fill: 12/31/2023  Next Visit: 06/06/2024  Last Visit: 03/07/2024  Dx: Seropositive rheumatoid arthritis   Current Dose per office note on 03/07/2024: prednisone  10 mg daily   Okay to refill Prednisone ?

## 2024-03-25 NOTE — Telephone Encounter (Signed)
 Patient contacted the office and states he is out of his Prednisone  and needs the prescription sent in today because his pharmacy closes at 12 tomorrow. Please advise.

## 2024-03-26 ENCOUNTER — Other Ambulatory Visit: Payer: Self-pay | Admitting: Rheumatology

## 2024-03-26 DIAGNOSIS — M059 Rheumatoid arthritis with rheumatoid factor, unspecified: Secondary | ICD-10-CM

## 2024-03-26 MED ORDER — PREDNISONE 10 MG PO TABS: 10.0000 mg | ORAL_TABLET | Freq: Every day | ORAL | 0 refills | Status: AC

## 2024-03-26 NOTE — Progress Notes (Signed)
 I received a phone call from the patient.  He requested prescription refill for prednisone  10 mg p.o. daily, a 90-day supply.  He stated that he has been on the same dose of prednisone  for several months.  Side effects of long-term use of prednisone  were discussed.  A prescription for prednisone  90-day supply was sent to the pharmacy requested. Maya Nash, MD

## 2024-04-10 ENCOUNTER — Other Ambulatory Visit (HOSPITAL_COMMUNITY): Payer: Self-pay | Admitting: Physician Assistant

## 2024-04-10 DIAGNOSIS — I4819 Other persistent atrial fibrillation: Secondary | ICD-10-CM

## 2024-04-14 ENCOUNTER — Other Ambulatory Visit: Payer: Self-pay

## 2024-04-14 ENCOUNTER — Other Ambulatory Visit (HOSPITAL_COMMUNITY): Payer: Self-pay

## 2024-04-17 NOTE — Progress Notes (Signed)
 "   Cardiology Office Note   Date   04/18/2024  ID:  Peter Mcmillan 1956/01/15, MRN [id number]  PCP:  Alben Therisa MATSU, PA  Cardiologist:   Vina Gull, MD   Patient presents for follow up of atrial fibrillation     History of Present Illness: Peter Mcmillan is a 69 y.o. male with a history of atrial fib/flutter, HFrEF and RA   -2015  Pt with atrial flutter Admitted to [facility] -2017  Seen by MATSU [place]  Underwent DCCV -2022  Pt presented to the hospital with afib RVR.  Started on Eliquis  for a CHADS2VASC score of 1. He underwent TEE guided DCCV on 10/06/20.  AUg 2022 Pt hospitalized again  for progressive dyspnea and lower extremity edema. He was cardioverted in the ED then admitted for observation and diuresis.  AUg 2022, Pt presented to ER  with atrial flutter with RVR. He was successfully cardioverted.  Aug 2022  CCTA pre ablation showed normal coronary arteries  No plaquing   Hepatic steatosis     Sept 2022  Pt underwent afib and aflutter ablation by JAYSON Holts Unfortunately, he had recurrence of rapid afib and had another DCCV in the ED on 12/28/20. His metoprolol  was also increased Jan 2023  Pt loaded with Tikosyn   Converted to SR   FOllows with T Turner for OSA    I saw the pt in April 2025  He was last seen in cardiology by APP   Complained of pedal edema and burning in feet  Lasix  had been increased previously with improvement in LE edema  Since seen, the pt denies CP  No SOB  No dizziness  No palpitations  He says he just gets tired    Would like to come off some of meds if able   Follows in rheumatology for RA   Still with pains in feet     Current Meds  Medication Sig   dofetilide  (TIKOSYN ) 250 MCG capsule TAKE ONE CAPSULE BY MOUTH TWICE DAILY   ELIQUIS  5 MG TABS tablet TAKE ONE TABLET BY MOUTH TWICE A DAY   furosemide  (LASIX ) 20 MG tablet Take 2 tablets (40 mg total) by mouth every other day. (Patient taking differently: Take 40 mg by mouth as needed.)    ipratropium (ATROVENT) 0.03 % nasal spray as needed.   leflunomide  (ARAVA ) 20 MG tablet TAKE ONE TABLET (20 MG TOTAL) BY MOUTH DAILY.   loratadine  (CLARITIN ) 10 MG tablet Take 10 mg by mouth as needed for allergies.   metoprolol  succinate (TOPROL -XL) 50 MG 24 hr tablet Take 1 tablet (50 mg total) by mouth 2 (two) times daily.   potassium chloride  SA (KLOR-CON  M) 20 MEQ tablet Take 1 tablet (20 mEq total) by mouth every other day. Take with Lasix .   predniSONE  (DELTASONE ) 10 MG tablet TAKE ONE TABLET (10 MG) BY MOUTH DAILY WITH BREAKFAST.   sacubitril -valsartan  (ENTRESTO ) 24-26 MG Take 1 tablet by mouth 2 (two) times daily.   tadalafil (CIALIS) 10 MG tablet as needed.     Allergies:   Codeine, Other, Penicillin g, and Penicillins   Past Medical History:  Diagnosis Date   Atrial flutter (HCC)    Bursitis of right elbow    COVID 11/12/2023   Erectile dysfunction    Rheumatoid arthritis (HCC)    Tobacco abuse    Smokeless tobacco    Past Surgical History:  Procedure Laterality Date   ATRIAL FIBRILLATION ABLATION N/A 12/17/2020   Procedure: ATRIAL  FIBRILLATION ABLATION;  Surgeon: Cindie Ole DASEN, MD;  Location: [facility] INVASIVE CV LAB;  Service: Cardiovascular;  Laterality: N/A;   BACK SURGERY  1999   CARDIOVERSION N/A 10/06/2020   Procedure: CARDIOVERSION;  Surgeon: Kate Lonni CROME, MD;  Location: [facility] ENDOSCOPY;  Service: Cardiovascular;  Laterality: N/A;   COLONOSCOPY     MELANOMA EXCISION  2020   TEE WITHOUT CARDIOVERSION N/A 10/06/2020   Procedure: TRANSESOPHAGEAL ECHOCARDIOGRAM (TEE);  Surgeon: Kate Lonni CROME, MD;  Location: [facility] ENDOSCOPY;  Service: Cardiovascular;  Laterality: N/A;   TEE WITHOUT CARDIOVERSION N/A 12/17/2020   Procedure: TRANSESOPHAGEAL ECHOCARDIOGRAM (TEE);  Surgeon: Barbaraann Darryle Ned, MD;  Location: [facility] INVASIVE CV LAB;  Service: Cardiovascular;  Laterality: N/A;     Social History:  The patient  reports that he has never smoked. He has never  been exposed to tobacco smoke. His smokeless tobacco use includes snuff. He reports current alcohol use. He reports that he does not use drugs.   Family History:  The patient's family history includes Dementia in his mother; Healthy in his sister and son; Heart attack in his father and mother.    ROS:  Please see the history of present illness. All other systems are reviewed and  Negative to the above problem except as noted.    PHYSICAL EXAM: VS:  BP 120/72 (BP Location: Left Arm, Patient Position: Sitting)   Pulse (!) 106   Ht 6' 5 (1.956 m)   Wt 219 lb 3.2 oz (99.4 kg)   SpO2 96%   BMI 25.99 kg/m    GEN: OVerweight 69 yo  in no acute distress  HEENT: normal  Neck: JVP is normal     Cardiac: RRR; no murmurs  Respiratory:  clear to auscultation  GI: soft, nontender  No hepatomegaly   Extrem  Varicosities noted  Tr LE  edema    EKG:  EKG shows SR with occasional PAC  96 bpm    Echo  June 2024   1. Left ventricular ejection fraction, by estimation, is 55 to 60%. The  left ventricle has normal function. The left ventricle has no regional  wall motion abnormalities. Left ventricular diastolic parameters are  consistent with Grade I diastolic  dysfunction (impaired relaxation).   2. Right ventricular systolic function is normal. The right ventricular  size is normal.   3. The mitral valve is normal in structure. Mild mitral valve  regurgitation. No evidence of mitral stenosis.   4. The aortic valve is normal in structure. Aortic valve regurgitation is  not visualized. No aortic stenosis is present.   5. The inferior vena cava is normal in size with greater than 50%  respiratory variability, suggesting right atrial pressure of 3 mmHg.    Cardiac CTA 2022   Coronary Arteries:  Normal coronary origin.  Right dominance.   RCA is a large dominant artery that gives rise to PDA and PLA. There is no plaque.   Left main is a large artery that gives rise to LAD and LCX  arteries.   LAD is a large vessel that has no plaque.   LCX is a non-dominant artery that gives rise to two large OM branches. There is no plaque. (A portion of the proximal to mid vessel is not interpretable due to misregistration artifact).   Other findings:   Normal pulmonary vein drainage into the left atrium.   RUPV: 19.2 x 15.8 mm, area 2.2 cm2   RLPV: 23.7 x 21.0 mm, area 3.82 cm2  LUPV: 20.2 x 15.6 mm, area 2.46 cm2   LLPV: 20.4 x 16.8 mm, area 2.71 cm2   Can not exclude left atrial appendage thrombus. There are no delayed acquisitions to confirm absence of clot.   Normal size of the pulmonary artery.   Please see radiology report for non cardiac findings.   IMPRESSION: 1. Coronary calcium  score of 0. This was 0 percentile for age and sex matched control.   2. Normal coronary origin with right dominance.   3. No evidence of CAD. (A small portion of the proximal to mid circumflex is not interpretable due to misregistration artifact).   4. Can not exclude left atrial appendage thrombus. There are no delayed acquisitions to confirm absence of clot. Consider repeat dedicated pulmonary vein protocol study or TEE.  Lipid Panel    Component Value Date/Time   CHOL 150 03/07/2024 1119   TRIG 115 03/07/2024 1119   HDL 52 03/07/2024 1119   CHOLHDL 2.9 03/07/2024 1119   VLDL 8 03/15/2023 0442   LDLCALC 78 03/07/2024 1119      Wt Readings from Last 3 Encounters:  04/18/24 219 lb 3.2 oz (99.4 kg)  03/07/24 214 lb (97.1 kg)  01/31/24 219 lb 12.8 oz (99.7 kg)      ASSESSMENT AND PLAN:  1  Atrial fibrillation.   Pt s/p ablation Recurrence after  Put on Tikosyn  Currently in SR  Would set up for Zio patch to evaluate for recurrence  Keep on Eliquis   2  LE edema  Pt with varicosities  OVerall triv edema  Volume OK     3  Hx HFimpEF   TEE in 2022 LVEF 40 to 45 %  Felt tachy induced  Echo in 2025 LVEF normal       4  HTN  BP remains undergood contorl  5  LIpids   LDL78  HDL 52  Trig 115    6  Metabolic  A1C 5.9  Limit carbs (cut out SSB(pt does drink), juices, processed carbs)  Try to stay active    Follow up in 6 months    Current medicines are reviewed at length with the patient today.  The patient does not have concerns regarding medicines.  Signed, Vina Gull, MD   "

## 2024-04-18 ENCOUNTER — Encounter: Payer: Self-pay | Admitting: Internal Medicine

## 2024-04-18 ENCOUNTER — Ambulatory Visit

## 2024-04-18 ENCOUNTER — Ambulatory Visit: Admitting: Internal Medicine

## 2024-04-18 VITALS — BP 120/72 | HR 106 | Ht 77.0 in | Wt 219.2 lb

## 2024-04-18 DIAGNOSIS — I480 Paroxysmal atrial fibrillation: Secondary | ICD-10-CM

## 2024-04-18 DIAGNOSIS — I10 Essential (primary) hypertension: Secondary | ICD-10-CM | POA: Diagnosis not present

## 2024-04-18 NOTE — Progress Notes (Unsigned)
Applied a 3 day Zio XT monitor to patient in the office 

## 2024-04-18 NOTE — Patient Instructions (Signed)
 Medication Instructions:  The current medical regimen is effective;  continue present plan and medications.  *If you need a refill on your cardiac medications before your next appointment, please call your pharmacy*  Testing/Procedures: ZIO XT- Long Term Monitor Instructions  Your physician has requested you wear a ZIO patch monitor for 2 days.  This is a single patch monitor. [employer] supplies one patch monitor per enrollment. Additional stickers are not available. Please do not apply patch if you will be having a Nuclear Stress Test,  Echocardiogram, Cardiac CT, MRI, or Chest Xray during the period you would be wearing the  monitor. The patch cannot be worn during these tests. You cannot remove and re-apply the  ZIO XT patch monitor.  Your ZIO patch monitor will be mailed 3 day USPS to your address on file. It may take 3-5 days  to receive your monitor after you have been enrolled.  Once you have received your monitor, please review the enclosed instructions. Your monitor  has already been registered assigning a specific monitor serial # to you.  Billing and Patient Assistance Program Information  We have supplied [employer] with any of your insurance information on file for billing purposes. [employer] offers a sliding scale Patient Assistance Program for patients that do not have  insurance, or whose insurance does not completely cover the cost of the ZIO monitor.  You must apply for the Patient Assistance Program to qualify for this discounted rate.  To apply, please call [employer] at [phone], select option 4, select option 2, ask to apply for  Patient Assistance Program. Meredeth will ask your household income, and how many people  are in your household. They will quote your out-of-pocket cost based on that information.  [employer] will also be able to set up a 60-month, interest-free payment plan if needed.  Applying the monitor   Shave hair from upper left chest.  Hold abrader disc  by orange tab. Rub abrader in 40 strokes over the upper left chest as  indicated in your monitor instructions.  Clean area with 4 enclosed alcohol pads. Let dry.  Apply patch as indicated in monitor instructions. Patch will be placed under collarbone on left  side of chest with arrow pointing upward.  Rub patch adhesive wings for 2 minutes. Remove white label marked 1. Remove the white  label marked 2. Rub patch adhesive wings for 2 additional minutes.  While looking in a mirror, press and release button in center of patch. A small green light will  flash 3-4 times. This will be your only indicator that the monitor has been turned on.  Do not shower for the first 24 hours. You may shower after the first 24 hours.  Press the button if you feel a symptom. You will hear a small click. Record Date, Time and  Symptom in the Patient Logbook.  When you are ready to remove the patch, follow instructions on the last 2 pages of Patient  Logbook. Stick patch monitor onto the last page of Patient Logbook.  Place Patient Logbook in the blue and white box. Use locking tab on box and tape box closed  securely. The blue and white box has prepaid postage on it. Please place it in the mailbox as  soon as possible. Your physician should have your test results approximately 7 days after the  monitor has been mailed back to [facility].  Call [facility] Customer Care at [phone] if you have questions regarding  your ZIO XT patch  monitor. Call them immediately if you see an orange light blinking on your  monitor.  If your monitor falls off in less than 4 days, contact our Monitor department at [phone].  If your monitor becomes loose or falls off after 4 days call [employer] at [phone] for  suggestions on securing your monitor   Follow-Up: At [facility], you and your health needs are our priority.  As part of our continuing mission to provide you with exceptional heart care,  our providers are all part of one team.  This team includes your primary Cardiologist (physician) and Advanced Practice Providers or APPs (Physician Assistants and Nurse Practitioners) who all work together to provide you with the care you need, when you need it.  Your next appointment:   6 month(s)  Provider:   Vina Gull, MD    We recommend signing up for the patient portal called MyChart.  Sign up information is provided on this After Visit Summary.  MyChart is used to connect with patients for Virtual Visits (Telemedicine).  Patients are able to view lab/test results, encounter notes, upcoming appointments, etc.  Non-urgent messages can be sent to your provider as well.   To learn more about what you can do with MyChart, go to [email or web address].

## 2024-04-25 ENCOUNTER — Other Ambulatory Visit: Payer: Self-pay | Admitting: Vascular Surgery

## 2024-04-25 DIAGNOSIS — M7989 Other specified soft tissue disorders: Secondary | ICD-10-CM

## 2024-06-06 ENCOUNTER — Ambulatory Visit: Admitting: Internal Medicine

## 2024-06-13 ENCOUNTER — Ambulatory Visit (HOSPITAL_COMMUNITY)

## 2024-06-13 ENCOUNTER — Encounter: Admitting: Vascular Surgery
